# Patient Record
Sex: Female | Born: 1960 | Race: White | Hispanic: No | Smoking: Current every day smoker
Health system: Southern US, Community
[De-identification: ages and names within clinical notes are randomized; demographics above are authoritative.]

## PROBLEM LIST (undated history)

## (undated) DIAGNOSIS — Z720 Tobacco use: Secondary | ICD-10-CM

## (undated) DIAGNOSIS — I10 Essential (primary) hypertension: Secondary | ICD-10-CM

## (undated) DIAGNOSIS — I7300 Raynaud's syndrome without gangrene: Secondary | ICD-10-CM

## (undated) DIAGNOSIS — K219 Gastro-esophageal reflux disease without esophagitis: Secondary | ICD-10-CM

## (undated) DIAGNOSIS — F4310 Post-traumatic stress disorder, unspecified: Secondary | ICD-10-CM

## (undated) DIAGNOSIS — E7800 Pure hypercholesterolemia, unspecified: Secondary | ICD-10-CM

## (undated) DIAGNOSIS — I4891 Unspecified atrial fibrillation: Secondary | ICD-10-CM

## (undated) DIAGNOSIS — I639 Cerebral infarction, unspecified: Secondary | ICD-10-CM

## (undated) DIAGNOSIS — F419 Anxiety disorder, unspecified: Secondary | ICD-10-CM

## (undated) DIAGNOSIS — G43909 Migraine, unspecified, not intractable, without status migrainosus: Secondary | ICD-10-CM

## (undated) DIAGNOSIS — G609 Hereditary and idiopathic neuropathy, unspecified: Secondary | ICD-10-CM

## (undated) HISTORY — DX: Unspecified atrial fibrillation: I48.91

## (undated) HISTORY — PX: ABDOMINAL HYSTERECTOMY: SHX81

## (undated) HISTORY — DX: Migraine, unspecified, not intractable, without status migrainosus: G43.909

## (undated) HISTORY — DX: Hereditary and idiopathic neuropathy, unspecified: G60.9

## (undated) HISTORY — PX: KNEE SURGERY: SHX244

## (undated) HISTORY — PX: WRIST SURGERY: SHX841

## (undated) HISTORY — PX: HIP SURGERY: SHX245

## (undated) HISTORY — DX: Essential (primary) hypertension: I10

## (undated) HISTORY — DX: Tobacco use: Z72.0

## (undated) HISTORY — DX: Gastro-esophageal reflux disease without esophagitis: K21.9

---

## 2011-10-10 DIAGNOSIS — R110 Nausea: Secondary | ICD-10-CM | POA: Diagnosis not present

## 2011-10-10 DIAGNOSIS — R42 Dizziness and giddiness: Secondary | ICD-10-CM | POA: Diagnosis not present

## 2011-10-10 DIAGNOSIS — I731 Thromboangiitis obliterans [Buerger's disease]: Secondary | ICD-10-CM | POA: Diagnosis not present

## 2011-10-13 DIAGNOSIS — I731 Thromboangiitis obliterans [Buerger's disease]: Secondary | ICD-10-CM | POA: Diagnosis not present

## 2011-10-13 DIAGNOSIS — L97909 Non-pressure chronic ulcer of unspecified part of unspecified lower leg with unspecified severity: Secondary | ICD-10-CM | POA: Diagnosis not present

## 2011-10-21 DIAGNOSIS — I731 Thromboangiitis obliterans [Buerger's disease]: Secondary | ICD-10-CM | POA: Diagnosis not present

## 2011-10-23 DIAGNOSIS — M25519 Pain in unspecified shoulder: Secondary | ICD-10-CM | POA: Diagnosis not present

## 2011-10-23 DIAGNOSIS — R209 Unspecified disturbances of skin sensation: Secondary | ICD-10-CM | POA: Diagnosis not present

## 2011-10-23 DIAGNOSIS — IMO0001 Reserved for inherently not codable concepts without codable children: Secondary | ICD-10-CM | POA: Diagnosis not present

## 2011-10-23 DIAGNOSIS — M47812 Spondylosis without myelopathy or radiculopathy, cervical region: Secondary | ICD-10-CM | POA: Diagnosis not present

## 2011-10-23 DIAGNOSIS — M542 Cervicalgia: Secondary | ICD-10-CM | POA: Diagnosis not present

## 2011-10-25 DIAGNOSIS — M5412 Radiculopathy, cervical region: Secondary | ICD-10-CM | POA: Diagnosis not present

## 2011-10-25 DIAGNOSIS — M47812 Spondylosis without myelopathy or radiculopathy, cervical region: Secondary | ICD-10-CM | POA: Diagnosis not present

## 2011-10-25 DIAGNOSIS — M5030 Other cervical disc degeneration, unspecified cervical region: Secondary | ICD-10-CM | POA: Diagnosis not present

## 2011-10-25 DIAGNOSIS — M542 Cervicalgia: Secondary | ICD-10-CM | POA: Diagnosis not present

## 2011-10-28 DIAGNOSIS — M25519 Pain in unspecified shoulder: Secondary | ICD-10-CM | POA: Diagnosis not present

## 2011-10-28 DIAGNOSIS — M542 Cervicalgia: Secondary | ICD-10-CM | POA: Diagnosis not present

## 2011-10-28 DIAGNOSIS — M47812 Spondylosis without myelopathy or radiculopathy, cervical region: Secondary | ICD-10-CM | POA: Diagnosis not present

## 2011-10-28 DIAGNOSIS — IMO0001 Reserved for inherently not codable concepts without codable children: Secondary | ICD-10-CM | POA: Diagnosis not present

## 2011-10-28 DIAGNOSIS — R209 Unspecified disturbances of skin sensation: Secondary | ICD-10-CM | POA: Diagnosis not present

## 2011-10-30 DIAGNOSIS — M79609 Pain in unspecified limb: Secondary | ICD-10-CM | POA: Diagnosis not present

## 2011-10-30 DIAGNOSIS — M5412 Radiculopathy, cervical region: Secondary | ICD-10-CM | POA: Diagnosis not present

## 2011-10-30 DIAGNOSIS — M25519 Pain in unspecified shoulder: Secondary | ICD-10-CM | POA: Diagnosis not present

## 2011-10-30 DIAGNOSIS — M47812 Spondylosis without myelopathy or radiculopathy, cervical region: Secondary | ICD-10-CM | POA: Diagnosis not present

## 2011-11-11 DIAGNOSIS — R946 Abnormal results of thyroid function studies: Secondary | ICD-10-CM | POA: Diagnosis not present

## 2011-11-11 DIAGNOSIS — I731 Thromboangiitis obliterans [Buerger's disease]: Secondary | ICD-10-CM | POA: Diagnosis not present

## 2011-11-11 DIAGNOSIS — R6889 Other general symptoms and signs: Secondary | ICD-10-CM | POA: Diagnosis not present

## 2011-11-13 DIAGNOSIS — M25519 Pain in unspecified shoulder: Secondary | ICD-10-CM | POA: Diagnosis not present

## 2011-11-13 DIAGNOSIS — M5412 Radiculopathy, cervical region: Secondary | ICD-10-CM | POA: Diagnosis not present

## 2011-11-13 DIAGNOSIS — M79609 Pain in unspecified limb: Secondary | ICD-10-CM | POA: Diagnosis not present

## 2011-11-18 DIAGNOSIS — L97509 Non-pressure chronic ulcer of other part of unspecified foot with unspecified severity: Secondary | ICD-10-CM | POA: Diagnosis not present

## 2011-11-18 DIAGNOSIS — I7300 Raynaud's syndrome without gangrene: Secondary | ICD-10-CM | POA: Diagnosis not present

## 2011-11-25 DIAGNOSIS — I739 Peripheral vascular disease, unspecified: Secondary | ICD-10-CM | POA: Diagnosis not present

## 2011-12-01 DIAGNOSIS — M47812 Spondylosis without myelopathy or radiculopathy, cervical region: Secondary | ICD-10-CM | POA: Diagnosis not present

## 2011-12-01 DIAGNOSIS — M5412 Radiculopathy, cervical region: Secondary | ICD-10-CM | POA: Diagnosis not present

## 2011-12-01 DIAGNOSIS — R209 Unspecified disturbances of skin sensation: Secondary | ICD-10-CM | POA: Diagnosis not present

## 2011-12-01 DIAGNOSIS — M79609 Pain in unspecified limb: Secondary | ICD-10-CM | POA: Diagnosis not present

## 2011-12-03 DIAGNOSIS — M79609 Pain in unspecified limb: Secondary | ICD-10-CM | POA: Diagnosis not present

## 2011-12-03 DIAGNOSIS — G5790 Unspecified mononeuropathy of unspecified lower limb: Secondary | ICD-10-CM | POA: Diagnosis not present

## 2011-12-03 DIAGNOSIS — N059 Unspecified nephritic syndrome with unspecified morphologic changes: Secondary | ICD-10-CM | POA: Diagnosis not present

## 2011-12-15 DIAGNOSIS — G90529 Complex regional pain syndrome I of unspecified lower limb: Secondary | ICD-10-CM | POA: Diagnosis not present

## 2011-12-15 DIAGNOSIS — M79609 Pain in unspecified limb: Secondary | ICD-10-CM | POA: Diagnosis not present

## 2011-12-23 DIAGNOSIS — G90529 Complex regional pain syndrome I of unspecified lower limb: Secondary | ICD-10-CM | POA: Diagnosis not present

## 2011-12-23 DIAGNOSIS — G589 Mononeuropathy, unspecified: Secondary | ICD-10-CM | POA: Diagnosis not present

## 2012-01-07 DIAGNOSIS — Z5189 Encounter for other specified aftercare: Secondary | ICD-10-CM | POA: Diagnosis not present

## 2012-01-07 DIAGNOSIS — M79609 Pain in unspecified limb: Secondary | ICD-10-CM | POA: Diagnosis not present

## 2012-01-09 DIAGNOSIS — G90529 Complex regional pain syndrome I of unspecified lower limb: Secondary | ICD-10-CM | POA: Diagnosis not present

## 2012-01-09 DIAGNOSIS — G589 Mononeuropathy, unspecified: Secondary | ICD-10-CM | POA: Diagnosis not present

## 2012-01-26 DIAGNOSIS — I728 Aneurysm of other specified arteries: Secondary | ICD-10-CM | POA: Diagnosis not present

## 2012-01-26 DIAGNOSIS — I7300 Raynaud's syndrome without gangrene: Secondary | ICD-10-CM | POA: Diagnosis not present

## 2012-01-30 DIAGNOSIS — G90529 Complex regional pain syndrome I of unspecified lower limb: Secondary | ICD-10-CM | POA: Diagnosis not present

## 2012-01-30 DIAGNOSIS — G589 Mononeuropathy, unspecified: Secondary | ICD-10-CM | POA: Diagnosis not present

## 2012-02-06 DIAGNOSIS — I7300 Raynaud's syndrome without gangrene: Secondary | ICD-10-CM | POA: Diagnosis not present

## 2012-02-06 DIAGNOSIS — I731 Thromboangiitis obliterans [Buerger's disease]: Secondary | ICD-10-CM | POA: Diagnosis not present

## 2012-02-11 DIAGNOSIS — I7300 Raynaud's syndrome without gangrene: Secondary | ICD-10-CM | POA: Diagnosis not present

## 2012-03-09 DIAGNOSIS — I7300 Raynaud's syndrome without gangrene: Secondary | ICD-10-CM | POA: Diagnosis not present

## 2012-03-10 DIAGNOSIS — R002 Palpitations: Secondary | ICD-10-CM | POA: Diagnosis not present

## 2012-03-11 DIAGNOSIS — R002 Palpitations: Secondary | ICD-10-CM | POA: Diagnosis not present

## 2012-03-22 DIAGNOSIS — G589 Mononeuropathy, unspecified: Secondary | ICD-10-CM | POA: Diagnosis not present

## 2012-03-22 DIAGNOSIS — G90529 Complex regional pain syndrome I of unspecified lower limb: Secondary | ICD-10-CM | POA: Diagnosis not present

## 2012-04-26 DIAGNOSIS — N059 Unspecified nephritic syndrome with unspecified morphologic changes: Secondary | ICD-10-CM | POA: Diagnosis not present

## 2012-04-26 DIAGNOSIS — G589 Mononeuropathy, unspecified: Secondary | ICD-10-CM | POA: Diagnosis not present

## 2012-04-26 DIAGNOSIS — G8929 Other chronic pain: Secondary | ICD-10-CM | POA: Diagnosis not present

## 2012-05-12 DIAGNOSIS — I499 Cardiac arrhythmia, unspecified: Secondary | ICD-10-CM | POA: Diagnosis not present

## 2012-05-17 DIAGNOSIS — I4891 Unspecified atrial fibrillation: Secondary | ICD-10-CM | POA: Diagnosis not present

## 2012-07-28 DIAGNOSIS — J069 Acute upper respiratory infection, unspecified: Secondary | ICD-10-CM | POA: Diagnosis not present

## 2012-07-28 DIAGNOSIS — Z23 Encounter for immunization: Secondary | ICD-10-CM | POA: Diagnosis not present

## 2012-08-13 DIAGNOSIS — G589 Mononeuropathy, unspecified: Secondary | ICD-10-CM | POA: Diagnosis not present

## 2012-08-13 DIAGNOSIS — G5770 Causalgia of unspecified lower limb: Secondary | ICD-10-CM | POA: Diagnosis not present

## 2012-09-08 DIAGNOSIS — M65839 Other synovitis and tenosynovitis, unspecified forearm: Secondary | ICD-10-CM | POA: Diagnosis not present

## 2012-09-08 DIAGNOSIS — Z0000 Encounter for general adult medical examination without abnormal findings: Secondary | ICD-10-CM | POA: Diagnosis not present

## 2012-09-08 DIAGNOSIS — M65849 Other synovitis and tenosynovitis, unspecified hand: Secondary | ICD-10-CM | POA: Diagnosis not present

## 2012-09-16 DIAGNOSIS — M654 Radial styloid tenosynovitis [de Quervain]: Secondary | ICD-10-CM | POA: Diagnosis not present

## 2012-09-16 DIAGNOSIS — M65849 Other synovitis and tenosynovitis, unspecified hand: Secondary | ICD-10-CM | POA: Diagnosis not present

## 2012-09-27 DIAGNOSIS — I7300 Raynaud's syndrome without gangrene: Secondary | ICD-10-CM | POA: Diagnosis not present

## 2012-09-27 DIAGNOSIS — G8929 Other chronic pain: Secondary | ICD-10-CM | POA: Diagnosis not present

## 2012-09-27 DIAGNOSIS — M79609 Pain in unspecified limb: Secondary | ICD-10-CM | POA: Diagnosis not present

## 2012-09-27 DIAGNOSIS — I10 Essential (primary) hypertension: Secondary | ICD-10-CM | POA: Diagnosis not present

## 2012-09-27 DIAGNOSIS — M25579 Pain in unspecified ankle and joints of unspecified foot: Secondary | ICD-10-CM | POA: Diagnosis not present

## 2012-09-27 DIAGNOSIS — I739 Peripheral vascular disease, unspecified: Secondary | ICD-10-CM | POA: Diagnosis not present

## 2012-09-27 DIAGNOSIS — IMO0002 Reserved for concepts with insufficient information to code with codable children: Secondary | ICD-10-CM | POA: Diagnosis not present

## 2012-10-01 DIAGNOSIS — IMO0002 Reserved for concepts with insufficient information to code with codable children: Secondary | ICD-10-CM | POA: Diagnosis not present

## 2012-10-01 DIAGNOSIS — M79609 Pain in unspecified limb: Secondary | ICD-10-CM | POA: Diagnosis not present

## 2012-10-06 LAB — HM COLONOSCOPY

## 2012-10-12 DIAGNOSIS — M5126 Other intervertebral disc displacement, lumbar region: Secondary | ICD-10-CM | POA: Diagnosis not present

## 2012-10-20 DIAGNOSIS — E785 Hyperlipidemia, unspecified: Secondary | ICD-10-CM | POA: Diagnosis not present

## 2012-10-20 DIAGNOSIS — Z0000 Encounter for general adult medical examination without abnormal findings: Secondary | ICD-10-CM | POA: Diagnosis not present

## 2012-10-21 DIAGNOSIS — E785 Hyperlipidemia, unspecified: Secondary | ICD-10-CM | POA: Diagnosis not present

## 2012-10-21 DIAGNOSIS — Z0000 Encounter for general adult medical examination without abnormal findings: Secondary | ICD-10-CM | POA: Diagnosis not present

## 2012-10-21 DIAGNOSIS — M79609 Pain in unspecified limb: Secondary | ICD-10-CM | POA: Diagnosis not present

## 2012-10-21 DIAGNOSIS — G8929 Other chronic pain: Secondary | ICD-10-CM | POA: Diagnosis not present

## 2012-11-03 DIAGNOSIS — N059 Unspecified nephritic syndrome with unspecified morphologic changes: Secondary | ICD-10-CM | POA: Diagnosis not present

## 2012-11-03 DIAGNOSIS — IMO0002 Reserved for concepts with insufficient information to code with codable children: Secondary | ICD-10-CM | POA: Diagnosis not present

## 2012-12-23 DIAGNOSIS — Z1231 Encounter for screening mammogram for malignant neoplasm of breast: Secondary | ICD-10-CM | POA: Diagnosis not present

## 2012-12-23 DIAGNOSIS — M51379 Other intervertebral disc degeneration, lumbosacral region without mention of lumbar back pain or lower extremity pain: Secondary | ICD-10-CM | POA: Diagnosis not present

## 2012-12-23 DIAGNOSIS — G8929 Other chronic pain: Secondary | ICD-10-CM | POA: Diagnosis not present

## 2012-12-23 DIAGNOSIS — M5137 Other intervertebral disc degeneration, lumbosacral region: Secondary | ICD-10-CM | POA: Diagnosis not present

## 2012-12-23 DIAGNOSIS — M79609 Pain in unspecified limb: Secondary | ICD-10-CM | POA: Diagnosis not present

## 2013-01-20 DIAGNOSIS — M79609 Pain in unspecified limb: Secondary | ICD-10-CM | POA: Diagnosis not present

## 2013-01-20 DIAGNOSIS — M25579 Pain in unspecified ankle and joints of unspecified foot: Secondary | ICD-10-CM | POA: Diagnosis not present

## 2013-01-20 DIAGNOSIS — IMO0002 Reserved for concepts with insufficient information to code with codable children: Secondary | ICD-10-CM | POA: Diagnosis not present

## 2013-01-20 DIAGNOSIS — M4716 Other spondylosis with myelopathy, lumbar region: Secondary | ICD-10-CM | POA: Diagnosis not present

## 2013-10-06 LAB — HM MAMMOGRAPHY: HM MAMMO: NORMAL

## 2014-02-24 DIAGNOSIS — E042 Nontoxic multinodular goiter: Secondary | ICD-10-CM | POA: Diagnosis not present

## 2014-02-24 DIAGNOSIS — R220 Localized swelling, mass and lump, head: Secondary | ICD-10-CM | POA: Diagnosis not present

## 2014-02-24 DIAGNOSIS — Z1231 Encounter for screening mammogram for malignant neoplasm of breast: Secondary | ICD-10-CM | POA: Diagnosis not present

## 2014-02-24 DIAGNOSIS — R918 Other nonspecific abnormal finding of lung field: Secondary | ICD-10-CM | POA: Diagnosis not present

## 2014-02-24 DIAGNOSIS — E785 Hyperlipidemia, unspecified: Secondary | ICD-10-CM | POA: Diagnosis not present

## 2014-02-24 DIAGNOSIS — F17200 Nicotine dependence, unspecified, uncomplicated: Secondary | ICD-10-CM | POA: Diagnosis not present

## 2014-02-24 DIAGNOSIS — Z0000 Encounter for general adult medical examination without abnormal findings: Secondary | ICD-10-CM | POA: Diagnosis not present

## 2014-02-24 DIAGNOSIS — R221 Localized swelling, mass and lump, neck: Secondary | ICD-10-CM | POA: Diagnosis not present

## 2014-02-24 DIAGNOSIS — Z124 Encounter for screening for malignant neoplasm of cervix: Secondary | ICD-10-CM | POA: Diagnosis not present

## 2014-02-24 DIAGNOSIS — R634 Abnormal weight loss: Secondary | ICD-10-CM | POA: Diagnosis not present

## 2014-04-10 DIAGNOSIS — J309 Allergic rhinitis, unspecified: Secondary | ICD-10-CM | POA: Diagnosis not present

## 2014-04-10 DIAGNOSIS — R911 Solitary pulmonary nodule: Secondary | ICD-10-CM | POA: Diagnosis not present

## 2014-04-10 DIAGNOSIS — R634 Abnormal weight loss: Secondary | ICD-10-CM | POA: Diagnosis not present

## 2014-04-10 DIAGNOSIS — K219 Gastro-esophageal reflux disease without esophagitis: Secondary | ICD-10-CM | POA: Diagnosis not present

## 2014-05-08 DIAGNOSIS — Z23 Encounter for immunization: Secondary | ICD-10-CM | POA: Diagnosis not present

## 2014-06-21 DIAGNOSIS — R634 Abnormal weight loss: Secondary | ICD-10-CM | POA: Diagnosis not present

## 2014-06-21 DIAGNOSIS — Z733 Stress, not elsewhere classified: Secondary | ICD-10-CM | POA: Diagnosis not present

## 2014-08-30 ENCOUNTER — Encounter (HOSPITAL_COMMUNITY): Payer: Self-pay | Admitting: *Deleted

## 2014-08-30 ENCOUNTER — Emergency Department (HOSPITAL_COMMUNITY): Payer: Medicare Other

## 2014-08-30 ENCOUNTER — Inpatient Hospital Stay (HOSPITAL_COMMUNITY)
Admission: EM | Admit: 2014-08-30 | Discharge: 2014-09-06 | DRG: 065 | Disposition: A | Discharge status: Home Health | Payer: Medicare Other | Attending: Internal Medicine | Admitting: Internal Medicine

## 2014-08-30 DIAGNOSIS — I639 Cerebral infarction, unspecified: Secondary | ICD-10-CM | POA: Diagnosis not present

## 2014-08-30 DIAGNOSIS — R21 Rash and other nonspecific skin eruption: Secondary | ICD-10-CM | POA: Insufficient documentation

## 2014-08-30 DIAGNOSIS — E7800 Pure hypercholesterolemia, unspecified: Secondary | ICD-10-CM | POA: Diagnosis present

## 2014-08-30 DIAGNOSIS — Z720 Tobacco use: Secondary | ICD-10-CM | POA: Insufficient documentation

## 2014-08-30 DIAGNOSIS — G459 Transient cerebral ischemic attack, unspecified: Secondary | ICD-10-CM | POA: Diagnosis present

## 2014-08-30 DIAGNOSIS — I63032 Cerebral infarction due to thrombosis of left carotid artery: Principal | ICD-10-CM | POA: Insufficient documentation

## 2014-08-30 DIAGNOSIS — E785 Hyperlipidemia, unspecified: Secondary | ICD-10-CM | POA: Diagnosis present

## 2014-08-30 DIAGNOSIS — I2510 Atherosclerotic heart disease of native coronary artery without angina pectoris: Secondary | ICD-10-CM | POA: Diagnosis present

## 2014-08-30 DIAGNOSIS — G458 Other transient cerebral ischemic attacks and related syndromes: Secondary | ICD-10-CM

## 2014-08-30 DIAGNOSIS — F4310 Post-traumatic stress disorder, unspecified: Secondary | ICD-10-CM | POA: Diagnosis present

## 2014-08-30 DIAGNOSIS — F419 Anxiety disorder, unspecified: Secondary | ICD-10-CM | POA: Diagnosis present

## 2014-08-30 DIAGNOSIS — L258 Unspecified contact dermatitis due to other agents: Secondary | ICD-10-CM | POA: Diagnosis present

## 2014-08-30 DIAGNOSIS — R531 Weakness: Secondary | ICD-10-CM | POA: Diagnosis not present

## 2014-08-30 DIAGNOSIS — D649 Anemia, unspecified: Secondary | ICD-10-CM | POA: Diagnosis present

## 2014-08-30 DIAGNOSIS — G8191 Hemiplegia, unspecified affecting right dominant side: Secondary | ICD-10-CM | POA: Diagnosis present

## 2014-08-30 DIAGNOSIS — J40 Bronchitis, not specified as acute or chronic: Secondary | ICD-10-CM | POA: Diagnosis not present

## 2014-08-30 DIAGNOSIS — R4701 Aphasia: Secondary | ICD-10-CM | POA: Diagnosis present

## 2014-08-30 DIAGNOSIS — I359 Nonrheumatic aortic valve disorder, unspecified: Secondary | ICD-10-CM | POA: Diagnosis not present

## 2014-08-30 DIAGNOSIS — I63422 Cerebral infarction due to embolism of left anterior cerebral artery: Secondary | ICD-10-CM | POA: Diagnosis not present

## 2014-08-30 DIAGNOSIS — I6340 Cerebral infarction due to embolism of unspecified cerebral artery: Secondary | ICD-10-CM | POA: Insufficient documentation

## 2014-08-30 DIAGNOSIS — K219 Gastro-esophageal reflux disease without esophagitis: Secondary | ICD-10-CM | POA: Diagnosis present

## 2014-08-30 DIAGNOSIS — F17210 Nicotine dependence, cigarettes, uncomplicated: Secondary | ICD-10-CM | POA: Diagnosis present

## 2014-08-30 DIAGNOSIS — I340 Nonrheumatic mitral (valve) insufficiency: Secondary | ICD-10-CM | POA: Diagnosis not present

## 2014-08-30 DIAGNOSIS — I7300 Raynaud's syndrome without gangrene: Secondary | ICD-10-CM | POA: Diagnosis present

## 2014-08-30 DIAGNOSIS — I6789 Other cerebrovascular disease: Secondary | ICD-10-CM | POA: Diagnosis not present

## 2014-08-30 DIAGNOSIS — I10 Essential (primary) hypertension: Secondary | ICD-10-CM | POA: Diagnosis present

## 2014-08-30 HISTORY — DX: Anxiety disorder, unspecified: F41.9

## 2014-08-30 HISTORY — DX: Raynaud's syndrome without gangrene: I73.00

## 2014-08-30 HISTORY — DX: Pure hypercholesterolemia, unspecified: E78.00

## 2014-08-30 HISTORY — DX: Post-traumatic stress disorder, unspecified: F43.10

## 2014-08-30 HISTORY — DX: Cerebral infarction, unspecified: I63.9

## 2014-08-30 LAB — URINE MICROSCOPIC-ADD ON

## 2014-08-30 LAB — DIFFERENTIAL
Basophils Absolute: 0 10*3/uL (ref 0.0–0.1)
Basophils Relative: 0 % (ref 0–1)
EOS PCT: 2 % (ref 0–5)
Eosinophils Absolute: 0.1 10*3/uL (ref 0.0–0.7)
Lymphocytes Relative: 27 % (ref 12–46)
Lymphs Abs: 2 10*3/uL (ref 0.7–4.0)
MONOS PCT: 6 % (ref 3–12)
Monocytes Absolute: 0.4 10*3/uL (ref 0.1–1.0)
Neutro Abs: 4.9 10*3/uL (ref 1.7–7.7)
Neutrophils Relative %: 65 % (ref 43–77)

## 2014-08-30 LAB — COMPREHENSIVE METABOLIC PANEL
ALBUMIN: 3.7 g/dL (ref 3.5–5.2)
ALK PHOS: 71 U/L (ref 39–117)
ALT: 8 U/L (ref 0–35)
AST: 14 U/L (ref 0–37)
Anion gap: 12 (ref 5–15)
BILIRUBIN TOTAL: 0.2 mg/dL — AB (ref 0.3–1.2)
BUN: 9 mg/dL (ref 6–23)
CO2: 24 meq/L (ref 19–32)
CREATININE: 0.69 mg/dL (ref 0.50–1.10)
Calcium: 8.8 mg/dL (ref 8.4–10.5)
Chloride: 104 mEq/L (ref 96–112)
GFR calc Af Amer: >90 mL/min (ref >90)
Glucose, Bld: 101 mg/dL — ABNORMAL HIGH (ref 70–99)
POTASSIUM: 3.9 meq/L (ref 3.7–5.3)
Sodium: 140 mEq/L (ref 137–147)
Total Protein: 7.1 g/dL (ref 6.0–8.3)

## 2014-08-30 LAB — APTT: aPTT: 37 seconds (ref 24–37)

## 2014-08-30 LAB — URINALYSIS, ROUTINE W REFLEX MICROSCOPIC
BILIRUBIN URINE: NEGATIVE
GLUCOSE, UA: NEGATIVE mg/dL
KETONES UR: NEGATIVE mg/dL
Nitrite: NEGATIVE
PH: 7 (ref 5.0–8.0)
Protein, ur: NEGATIVE mg/dL
SPECIFIC GRAVITY, URINE: 1.005 (ref 1.005–1.030)
Urobilinogen, UA: 1 mg/dL (ref 0.0–1.0)

## 2014-08-30 LAB — PROTIME-INR
INR: 1.12 (ref 0.00–1.49)
Prothrombin Time: 14.6 seconds (ref 11.6–15.2)

## 2014-08-30 LAB — I-STAT CHEM 8, ED
BUN: 8 mg/dL (ref 6–23)
CALCIUM ION: 1.14 mmol/L (ref 1.12–1.23)
Chloride: 102 mEq/L (ref 96–112)
Creatinine, Ser: 0.8 mg/dL (ref 0.50–1.10)
GLUCOSE: 103 mg/dL — AB (ref 70–99)
HCT: 38 % (ref 36.0–46.0)
Hemoglobin: 12.9 g/dL (ref 12.0–15.0)
Potassium: 3.9 mEq/L (ref 3.7–5.3)
Sodium: 141 mEq/L (ref 137–147)
TCO2: 23 mmol/L (ref 0–100)

## 2014-08-30 LAB — CBC
HEMATOCRIT: 35.7 % — AB (ref 36.0–46.0)
Hemoglobin: 12 g/dL (ref 12.0–15.0)
MCH: 31.8 pg (ref 26.0–34.0)
MCHC: 33.6 g/dL (ref 30.0–36.0)
MCV: 94.7 fL (ref 78.0–100.0)
PLATELETS: 158 10*3/uL (ref 150–400)
RBC: 3.77 MIL/uL — AB (ref 3.87–5.11)
RDW: 12.7 % (ref 11.5–15.5)
WBC: 7.4 10*3/uL (ref 4.0–10.5)

## 2014-08-30 LAB — I-STAT TROPONIN, ED: Troponin i, poc: 0 ng/mL (ref 0.00–0.08)

## 2014-08-30 NOTE — ED Notes (Signed)
Pt c/o R arm and leg weakness onset at 2030. [place] live in a hotel. Pt reports walking back from the front desk of the hotel when her R leg became limb. Pt had to drag herself to her room. Pts husband reports pt had a hard time speaking and words were slurred. EMS activated Code Stroke. Pt unable to lift R leg; reports sensation is decreased compared to L foot. Equal grips present. Speech is clear

## 2014-08-30 NOTE — Code Documentation (Signed)
53 yo wf [facility] for Rt side weakness & speech difficulty.  Per the pt she was walking to her bedroom from her office when she had a sudden onset Rt upper & lower ext weakness & word finding difficulty.  By arrival to MD the pt's speech had resolved.  She has baseline shaking from anxiety.  NIH 5 for weakness.  Pt on TIA alert.

## 2014-08-30 NOTE — ED Provider Notes (Signed)
CSN: [id number]     Arrival date & time 08/30/14  2141 History   First MD Initiated Contact with Patient 08/30/14 2145     No chief complaint on file.     The history is provided by the patient and the EMS personnel.   Patient presents for evaluation of right-sided weakness and aphasia. Symptoms started at 8:30 when she was walking to her room and she noticed that she was having difficulty moving her right leg and right arm. She presents via EMS as an activated code stroke. Per EMS she did have a speech difficulties that has improved on the way to hospital. Symptoms are moderate, constant, and improving.  No past medical history on file. No past surgical history on file. No family history on file. History  Substance Use Topics  . Smoking status: Not on file  . Smokeless tobacco: Not on file  . Alcohol Use: Not on file   OB History    No data available     Review of Systems  All other systems reviewed and are negative.    Allergies  Review of patient's allergies indicates not on file.  Home Medications   Prior to Admission medications   Not on File   There were no vitals taken for this visit. Physical Exam  Constitutional: She is oriented to person, place, and time. She appears well-developed and well-nourished.  HENT:  Head: Normocephalic and atraumatic.  Cardiovascular: Normal rate and regular rhythm.   No murmur heard. Pulmonary/Chest: Effort normal and breath sounds normal. No respiratory distress.  Abdominal: Soft. There is no tenderness. There is no rebound and no guarding.  Musculoskeletal: She exhibits no edema or tenderness.  Neurological: She is alert and oriented to person, place, and time.  4/5 RUE strength, 0/5 RLE strength, no facial assymetry, sensation to light touch intact in all four extremities  Skin: Skin is warm and dry.  Psychiatric: She has a normal mood and affect. Her behavior is normal.  Nursing note and vitals reviewed.   ED Course   Procedures (including critical care time) Labs Review Labs Reviewed  CBC - Abnormal; Notable for the following:    RBC 3.77 (*)    HCT 35.7 (*)    All other components within normal limits  COMPREHENSIVE METABOLIC PANEL - Abnormal; Notable for the following:    Glucose, Bld 101 (*)    Total Bilirubin 0.2 (*)    All other components within normal limits  URINALYSIS, ROUTINE W REFLEX MICROSCOPIC - Abnormal; Notable for the following:    Hgb urine dipstick MODERATE (*)    Leukocytes, UA SMALL (*)    All other components within normal limits  I-STAT CHEM 8, ED - Abnormal; Notable for the following:    Glucose, Bld 103 (*)    All other components within normal limits  PROTIME-INR  APTT  DIFFERENTIAL  URINE MICROSCOPIC-ADD ON  Rosezena SensorI-STAT TROPOININ, ED    Imaging Review Ct Head (brain) Wo Contrast  08/30/2014   CLINICAL DATA:  Right-sided weakness.  EXAM: CT HEAD WITHOUT CONTRAST  TECHNIQUE: Contiguous axial images were obtained from the base of the skull through the vertex without intravenous contrast.  COMPARISON:  None.  FINDINGS: No acute intracranial abnormality. Specifically, no hemorrhage, hydrocephalus, mass lesion, acute infarction, or significant intracranial injury. No acute calvarial abnormality. Visualized paranasal sinuses and mastoids clear. Orbital soft tissues unremarkable.  IMPRESSION: Negative.  Critical Value/emergent results were called by telephone at the time of interpretation on 08/30/2014 at  9:57 pm to Dr. Roseanne RenoStewart , who verbally acknowledged these results.   Electronically Signed   By: Charlett NoseKevin  Dover M.D.   On: 08/30/2014 21:57     EKG Interpretation None      MDM   Final diagnoses:  Other specified transient cerebral ischemias    Patient presented via EMS for evaluation of right hemiplegia. Code stroke was activated and patient was evaluated by Dr. Roseanne RenoStewart with neurology. Patient had marked improvement in her deficits prior to arrival and during her emergency  department stay. Patient not felt to be a TPA candidate and medicine was contacted for admission for TIA/stroke workup.     Tilden FossaElizabeth Sasan Wilkie, MD 08/31/14 [phone]

## 2014-08-30 NOTE — ED Notes (Signed)
Pt to ED from home via [facility] as a code stroke. LSN 2030. Pt was walking to her bed room when she began having R arm weakness, R leg weakness, and slurred speech. Per EMS, speech has improved; speech clear at this time

## 2014-08-30 NOTE — Consult Note (Signed)
Referring Physician: Madilyn HookEES, E    Chief Complaint: New onset right side weakness and numbness.  HPI: Dawn Escobar is an 53 y.o. female with a history of hypercholesterolemia, posttraumatic stress disorder, Raynaud's disease and anxiety who experienced acute onset of right-sided weakness and numbness at 8:40 PM tonight. She has no previous history of stroke nor TIA. She has not been on antiplatelet therapy. CT scan of her head showed no acute intracranial abnormality. Deficits improved after arriving in the emergency room, including return of her speech to normal as well as use of her right upper extremity. Deficits involving right lower extremity as well as sensory complaints were felt to likely be functional in etiology. Because her symptoms were improving and there were no clear objective deficits, TPA was not administered. NIH stroke score was 5.  LSN: 8:40 PM on 08/30/2014 tPA Given: No: Improving deficits and lack of clear objective remaining focal deficits mRankin:  Past Medical History  Diagnosis Date  . PTSD (post-traumatic stress disorder)   . Raynaud's disease   . Heart murmur   . Anxiety   . Hypercholesteremia     Family history: Negative for stroke but positive for hypercholesterolemia and hypertension.  Medications: I have reviewed the patient's current medications.  ROS: History obtained from the patient  General ROS: negative for - chills, fatigue, fever, night sweats, weight gain or weight loss Psychological ROS: negative for - behavioral disorder, hallucinations, memory difficulties, mood swings or suicidal ideation Ophthalmic ROS: negative for - blurry vision, double vision, eye pain or loss of vision ENT ROS: negative for - epistaxis, nasal discharge, oral lesions, sore throat, tinnitus or vertigo Allergy and Immunology ROS: negative for - hives or itchy/watery eyes Hematological and Lymphatic ROS: negative for - bleeding problems, bruising or swollen lymph  nodes Endocrine ROS: negative for - galactorrhea, hair pattern changes, polydipsia/polyuria or temperature intolerance Respiratory ROS: negative for - cough, hemoptysis, shortness of breath or wheezing Cardiovascular ROS: negative for - chest pain, dyspnea on exertion, edema or irregular heartbeat Gastrointestinal ROS: negative for - abdominal pain, diarrhea, hematemesis, nausea/vomiting or stool incontinence Genito-Urinary ROS: negative for - dysuria, hematuria, incontinence or urinary frequency/urgency Musculoskeletal ROS: negative for - joint swelling or muscular weakness Neurological ROS: as noted in HPI Dermatological ROS: negative for rash and skin lesion changes  Physical Examination: Blood pressure 158/74, pulse 72, temperature 98.6 F (37 C), temperature source Oral, resp. rate 16, SpO2 99 %.  Appearance was that of a slender middle-aged lady who is alert and in no acute distress. HEENT: Normal Neck: Supple with full range of motion Heart: Normal rate and rhythm med: Normal S1 and S2; no murmur nor gallop.  Neurologic Examination: Mental Status: Alert, oriented, thought content appropriate.  Speech fluent without evidence of aphasia. Able to follow commands without difficulty. Cranial Nerves: II-Visual fields were normal. III/IV/VI-Pupils were equal and reacted. Extraocular movements were full and conjugate.    V/VII-slight right facial numbness; no facial weakness. VIII-normal. X-normal speech. Motor: Absent voluntary movement of right lower extremity proximally and distally, thought to likely be due to poor effort; motor exam is otherwise unremarkable. Sensory: Reduced perception of tactile sensation over right extremities compared to left extremities. Deep Tendon Reflexes: 2+ and symmetric. Plantars: Flexor bilaterally Cerebellar: Normal finger-to-nose testing bilaterally. Carotid auscultation: Normal  Ct Head (brain) Wo Contrast  08/30/2014   CLINICAL DATA:   Right-sided weakness.  EXAM: CT HEAD WITHOUT CONTRAST  TECHNIQUE: Contiguous axial images were obtained from the base of the skull  through the vertex without intravenous contrast.  COMPARISON:  None.  FINDINGS: No acute intracranial abnormality. Specifically, no hemorrhage, hydrocephalus, mass lesion, acute infarction, or significant intracranial injury. No acute calvarial abnormality. Visualized paranasal sinuses and mastoids clear. Orbital soft tissues unremarkable.  IMPRESSION: Negative.  Critical Value/emergent results were called by telephone at the time of interpretation on 08/30/2014 at 9:57 pm to Dr. Roseanne RenoStewart , who verbally acknowledged these results.   Electronically Signed   By: Charlett NoseKevin  Dover M.D.   On: 08/30/2014 21:57    Assessment: 53 y.o. female presenting with weakness and numbness involving the right side with improvement since arrival in the emergency room. Psychophysiologic factors are suspected as contributor to patient's symptomatology. However, TIA or small ischemic stroke cannot be ruled out.  Stroke Risk Factors - hyperlipidemia  Plan: 1. HgbA1c, fasting lipid panel 2. MRI, MRA  of the brain without contrast 3. PT consult, OT consult, Speech consult 4. Echocardiogram 5. Carotid dopplers 6. Prophylactic therapy-Antiplatelet med: Aspirin  7. Risk factor modification 8. Telemetry monitoring   C.R. Roseanne RenoStewart, MD [facility] [phone]  08/30/2014, 10:32 PM

## 2014-08-30 NOTE — ED Notes (Signed)
Dr. Kakrakandy at bedside. 

## 2014-08-31 ENCOUNTER — Encounter (HOSPITAL_COMMUNITY): Payer: Self-pay | Admitting: Internal Medicine

## 2014-08-31 ENCOUNTER — Observation Stay (HOSPITAL_COMMUNITY): Payer: Medicare Other

## 2014-08-31 DIAGNOSIS — G458 Other transient cerebral ischemic attacks and related syndromes: Secondary | ICD-10-CM

## 2014-08-31 DIAGNOSIS — Z720 Tobacco use: Secondary | ICD-10-CM

## 2014-08-31 DIAGNOSIS — J40 Bronchitis, not specified as acute or chronic: Secondary | ICD-10-CM | POA: Diagnosis not present

## 2014-08-31 LAB — COMPREHENSIVE METABOLIC PANEL
ALT: 7 U/L (ref 0–35)
AST: 12 U/L (ref 0–37)
Albumin: 3.3 g/dL — ABNORMAL LOW (ref 3.5–5.2)
Alkaline Phosphatase: 65 U/L (ref 39–117)
Anion gap: 11 (ref 5–15)
BILIRUBIN TOTAL: 0.3 mg/dL (ref 0.3–1.2)
BUN: 8 mg/dL (ref 6–23)
CHLORIDE: 108 meq/L (ref 96–112)
CO2: 25 mEq/L (ref 19–32)
Calcium: 8.7 mg/dL (ref 8.4–10.5)
Creatinine, Ser: 0.62 mg/dL (ref 0.50–1.10)
GFR calc Af Amer: >90 mL/min (ref >90)
GFR calc non Af Amer: >90 mL/min (ref >90)
Glucose, Bld: 96 mg/dL (ref 70–99)
Potassium: 3.8 mEq/L (ref 3.7–5.3)
Sodium: 144 mEq/L (ref 137–147)
Total Protein: 6.5 g/dL (ref 6.0–8.3)

## 2014-08-31 LAB — CBC WITH DIFFERENTIAL/PLATELET
BASOS PCT: 1 % (ref 0–1)
Basophils Absolute: 0.1 10*3/uL (ref 0.0–0.1)
EOS PCT: 3 % (ref 0–5)
Eosinophils Absolute: 0.2 10*3/uL (ref 0.0–0.7)
HCT: 35 % — ABNORMAL LOW (ref 36.0–46.0)
HEMOGLOBIN: 11.8 g/dL — AB (ref 12.0–15.0)
LYMPHS ABS: 2 10*3/uL (ref 0.7–4.0)
LYMPHS PCT: 32 % (ref 12–46)
MCH: 31.8 pg (ref 26.0–34.0)
MCHC: 33.7 g/dL (ref 30.0–36.0)
MCV: 94.3 fL (ref 78.0–100.0)
MONOS PCT: 8 % (ref 3–12)
Monocytes Absolute: 0.5 10*3/uL (ref 0.1–1.0)
Neutro Abs: 3.6 10*3/uL (ref 1.7–7.7)
Neutrophils Relative %: 56 % (ref 43–77)
Platelets: 162 10*3/uL (ref 150–400)
RBC: 3.71 MIL/uL — AB (ref 3.87–5.11)
RDW: 12.7 % (ref 11.5–15.5)
WBC: 6.4 10*3/uL (ref 4.0–10.5)

## 2014-08-31 LAB — RAPID URINE DRUG SCREEN, HOSP PERFORMED
Amphetamines: NOT DETECTED
Barbiturates: NOT DETECTED
Benzodiazepines: NOT DETECTED
COCAINE: NOT DETECTED
OPIATES: NOT DETECTED
Tetrahydrocannabinol: NOT DETECTED

## 2014-08-31 LAB — LIPID PANEL
CHOL/HDL RATIO: 2.1 ratio
Cholesterol: 103 mg/dL (ref 0–200)
HDL: 48 mg/dL (ref >39)
LDL Cholesterol: 46 mg/dL (ref 0–99)
Triglycerides: 47 mg/dL (ref <150)
VLDL: 9 mg/dL (ref 0–40)

## 2014-08-31 LAB — CBC
HEMATOCRIT: 34.2 % — AB (ref 36.0–46.0)
HEMOGLOBIN: 11.6 g/dL — AB (ref 12.0–15.0)
MCH: 31.5 pg (ref 26.0–34.0)
MCHC: 33.9 g/dL (ref 30.0–36.0)
MCV: 92.9 fL (ref 78.0–100.0)
Platelets: 164 10*3/uL (ref 150–400)
RBC: 3.68 MIL/uL — ABNORMAL LOW (ref 3.87–5.11)
RDW: 12.8 % (ref 11.5–15.5)
WBC: 6.7 10*3/uL (ref 4.0–10.5)

## 2014-08-31 LAB — HEMOGLOBIN A1C
Hgb A1c MFr Bld: 5.6 % (ref <5.7)
Mean Plasma Glucose: 114 mg/dL (ref <117)

## 2014-08-31 LAB — TSH: TSH: 2.19 u[IU]/mL (ref 0.350–4.500)

## 2014-08-31 LAB — GLUCOSE, CAPILLARY
GLUCOSE-CAPILLARY: 136 mg/dL — AB (ref 70–99)
Glucose-Capillary: 106 mg/dL — ABNORMAL HIGH (ref 70–99)
Glucose-Capillary: 110 mg/dL — ABNORMAL HIGH (ref 70–99)
Glucose-Capillary: 125 mg/dL — ABNORMAL HIGH (ref 70–99)

## 2014-08-31 LAB — CREATININE, SERUM
Creatinine, Ser: 0.63 mg/dL (ref 0.50–1.10)
GFR calc Af Amer: >90 mL/min (ref >90)

## 2014-08-31 MED ORDER — GABAPENTIN 800 MG PO TABS
800.0000 mg | ORAL_TABLET | Freq: Three times a day (TID) | ORAL | Status: DC
  Filled 2014-08-31 (×2): qty 1

## 2014-08-31 MED ORDER — STROKE: EARLY STAGES OF RECOVERY BOOK
Freq: Once | Status: AC
Start: 2014-08-31 — End: 2014-08-31
  Administered 2014-08-31: 02:00:00
  Filled 2014-08-31: qty 1

## 2014-08-31 MED ORDER — CYCLOBENZAPRINE HCL 10 MG PO TABS
5.0000 mg | ORAL_TABLET | Freq: Three times a day (TID) | ORAL | Status: DC | PRN
  Administered 2014-08-31 – 2014-09-06 (×11): 5 mg via ORAL
  Filled 2014-08-31 (×12): qty 1

## 2014-08-31 MED ORDER — ASPIRIN 325 MG PO TABS
325.0000 mg | ORAL_TABLET | Freq: Every day | ORAL | Status: DC
  Administered 2014-08-31 – 2014-09-06 (×7): 325 mg via ORAL
  Filled 2014-08-31 (×7): qty 1

## 2014-08-31 MED ORDER — PANTOPRAZOLE SODIUM 40 MG PO TBEC
40.0000 mg | DELAYED_RELEASE_TABLET | Freq: Every day | ORAL | Status: DC
  Administered 2014-08-31 – 2014-09-04 (×5): 40 mg via ORAL
  Filled 2014-08-31 (×5): qty 1

## 2014-08-31 MED ORDER — ACETAMINOPHEN 325 MG PO TABS
650.0000 mg | ORAL_TABLET | ORAL | Status: DC | PRN
  Administered 2014-08-31 – 2014-09-05 (×5): 650 mg via ORAL
  Filled 2014-08-31 (×5): qty 2

## 2014-08-31 MED ORDER — SODIUM CHLORIDE 0.9 % IV SOLN
INTRAVENOUS | Status: DC
  Administered 2014-08-31: 1000 mL via INTRAVENOUS

## 2014-08-31 MED ORDER — TRAMADOL HCL 50 MG PO TABS
50.0000 mg | ORAL_TABLET | Freq: Once | ORAL | Status: AC
  Administered 2014-08-31: 50 mg via ORAL
  Filled 2014-08-31: qty 1

## 2014-08-31 MED ORDER — GABAPENTIN 400 MG PO CAPS
800.0000 mg | ORAL_CAPSULE | Freq: Three times a day (TID) | ORAL | Status: DC
  Administered 2014-08-31 – 2014-09-06 (×17): 800 mg via ORAL
  Filled 2014-08-31 (×10): qty 2
  Filled 2014-08-31: qty 8
  Filled 2014-08-31 (×7): qty 2

## 2014-08-31 MED ORDER — CLONAZEPAM 0.5 MG PO TABS
0.5000 mg | ORAL_TABLET | Freq: Three times a day (TID) | ORAL | Status: DC | PRN
  Administered 2014-08-31 – 2014-09-06 (×11): 0.5 mg via ORAL
  Filled 2014-08-31 (×13): qty 1

## 2014-08-31 MED ORDER — ROSUVASTATIN CALCIUM 40 MG PO TABS
40.0000 mg | ORAL_TABLET | Freq: Every day | ORAL | Status: DC
  Administered 2014-08-31: 40 mg via ORAL
  Filled 2014-08-31 (×2): qty 1

## 2014-08-31 MED ORDER — TRAZODONE HCL 100 MG PO TABS
100.0000 mg | ORAL_TABLET | Freq: Every evening | ORAL | Status: DC | PRN
  Administered 2014-08-31 – 2014-09-05 (×5): 100 mg via ORAL
  Filled 2014-08-31 (×5): qty 1

## 2014-08-31 MED ORDER — ENOXAPARIN SODIUM 40 MG/0.4ML ~~LOC~~ SOLN
40.0000 mg | SUBCUTANEOUS | Status: DC
  Administered 2014-08-31 – 2014-09-05 (×6): 40 mg via SUBCUTANEOUS
  Filled 2014-08-31 (×6): qty 0.4

## 2014-08-31 NOTE — Progress Notes (Signed)
UR completed 

## 2014-08-31 NOTE — Plan of Care (Signed)
Problem: Consults Goal: Ischemic Stroke Patient Education See Patient Education Module for education specifics. Outcome: Completed/Met Date Met:  08/31/14

## 2014-08-31 NOTE — Plan of Care (Signed)
Problem: Acute Treatment Outcomes Goal: Neuro exam at baseline or improved Outcome: Completed/Met Date Met:  08/31/14

## 2014-08-31 NOTE — Progress Notes (Signed)
PATIENT DETAILS Name: Dawn Escobar Age: 53 y.o. Sex: female Date of Birth: Jan 16, 1961 Admit Date: 08/30/2014 Admitting Physician Eduard ClosArshad N Kakrakandy, MD PCP:No PCP Per Patient  Subjective: Speech significantly improved-not back to baseline yet-appears to be nonfluent at times. Minimal right upper and right lower extremity weakness.  Assessment/Plan: Principal Problem:   Suspected small CVA: Admitted with right upper/right lower extremity weakness and aphasia. Symptoms almost resolved, speech still not fluent but almost back to baseline. Very minimal right-sided weakness on exam. Await MRI/MRA brain, echocardiogram and carotid Doppler. LDL at goal at 46, continue statins. Hemoglobin A1c pending. Currently on aspirin, continue. Await further recommendations from stroke team.   Active Problems:   Hypercholesterolemia: As above. Continue statin    Tobacco abuse: Also extensively, advised to quit.    History of the raynaud's phenomena:      History of GERD: Continue with PPI    History of anxiety: Continue with when necessary clonazepam.    Disposition: Remain inpatient-Home when work up complete  Antibiotics:  None   Anti-infectives    None      DVT Prophylaxis: Prophylactic Lovenox  Code Status: Full code   Family Communication None at bedside  Procedures:  None  CONSULTS:  neurology  Time spent 40 minutes-which includes 50% of the time with face-to-face with patient/ family and coordinating care related to the above assessment and plan.  MEDICATIONS: Scheduled Meds: . aspirin  325 mg Oral Daily  . enoxaparin (LOVENOX) injection  40 mg Subcutaneous Q24H  . pantoprazole  40 mg Oral Daily  . rosuvastatin  40 mg Oral q1800   Continuous Infusions: . sodium chloride 1,000 mL (08/31/14 0129)   PRN Meds:.acetaminophen, clonazePAM, cyclobenzaprine    PHYSICAL EXAM: Vital signs in last 24 hours: Filed Vitals:   08/31/14 0300 08/31/14 0500  08/31/14 0700 08/31/14 0845  BP: 132/83 145/71 132/74 135/77  Pulse: 59 66 66 61  Temp: 98.6 F (37 C) 99.1 F (37.3 C) 99 F (37.2 C) 98.6 F (37 C)  TempSrc: Oral Oral Oral Oral  Resp: 15 17 17 17   Height:      Weight:      SpO2: 99% 100% 97% 98%    Weight change:  Filed Weights   08/31/14 0100  Weight: 67.132 kg (148 lb)   Body mass index is 24.63 kg/(m^2).   Gen Exam: Awake and alert with clear speech-but at times non fluent.   Neck: Supple, No JVD.   Chest: B/L Clear.   CVS: S1 S2 Regular, no murmurs.  Abdomen: soft, BS +, non tender, non distended.  Extremities: no edema, lower extremities warm to touch. Neurologic:Very minimal RUE/RLE weakness  Skin: No Rash.   Wounds: N/A.    Intake/Output from previous day: No intake or output data in the 24 hours ending 08/31/14 1023   LAB RESULTS: CBC  Recent Labs Lab 08/30/14 2143 08/30/14 2156 08/31/14 0500  WBC 7.4  --  6.4  6.7  HGB 12.0 12.9 11.8*  11.6*  HCT 35.7* 38.0 35.0*  34.2*  PLT 158  --  162  164  MCV 94.7  --  94.3  92.9  MCH 31.8  --  31.8  31.5  MCHC 33.6  --  33.7  33.9  RDW 12.7  --  12.7  12.8  LYMPHSABS 2.0  --  2.0  MONOABS 0.4  --  0.5  EOSABS 0.1  --  0.2  BASOSABS 0.0  --  0.1    Chemistries   Recent Labs Lab 08/30/14 2143 08/30/14 2156 08/31/14 0500  NA 140 141 144  K 3.9 3.9 3.8  CL 104 102 108  CO2 24  --  25  GLUCOSE 101* 103* 96  BUN 9 8 8   CREATININE 0.69 0.80 0.62  0.63  CALCIUM 8.8  --  8.7    CBG:  Recent Labs Lab 08/31/14 0620  GLUCAP 106*    GFR Estimated Creatinine Clearance: 74 mL/min (by C-G formula based on Cr of 0.62).  Coagulation profile  Recent Labs Lab 08/30/14 2143  INR 1.12    Cardiac Enzymes No results for input(s): CKMB, TROPONINI, MYOGLOBIN in the last 168 hours.  Invalid input(s): CK  Invalid input(s): POCBNP No results for input(s): DDIMER in the last 72 hours. No results for input(s): HGBA1C in the last 72  hours.  Recent Labs  08/31/14 0500  CHOL 103  HDL 48  LDLCALC 46  TRIG 47  CHOLHDL 2.1    Recent Labs  08/31/14 0500  TSH 2.190   No results for input(s): VITAMINB12, FOLATE, FERRITIN, TIBC, IRON, RETICCTPCT in the last 72 hours. No results for input(s): LIPASE, AMYLASE in the last 72 hours.  Urine Studies No results for input(s): UHGB, CRYS in the last 72 hours.  Invalid input(s): UACOL, UAPR, USPG, UPH, UTP, UGL, UKET, UBIL, UNIT, UROB, ULEU, UEPI, UWBC, URBC, UBAC, CAST, UCOM, BILUA  MICROBIOLOGY: No results found for this or any previous visit (from the past 240 hour(s)).  RADIOLOGY STUDIES/RESULTS: Ct Head (brain) Wo Contrast  08/30/2014   CLINICAL DATA:  Right-sided weakness.  EXAM: CT HEAD WITHOUT CONTRAST  TECHNIQUE: Contiguous axial images were obtained from the base of the skull through the vertex without intravenous contrast.  COMPARISON:  None.  FINDINGS: No acute intracranial abnormality. Specifically, no hemorrhage, hydrocephalus, mass lesion, acute infarction, or significant intracranial injury. No acute calvarial abnormality. Visualized paranasal sinuses and mastoids clear. Orbital soft tissues unremarkable.  IMPRESSION: Negative.  Critical Value/emergent results were called by telephone at the time of interpretation on 08/30/2014 at 9:57 pm to Dr. Roseanne RenoStewart , who verbally acknowledged these results.   Electronically Signed   By: Charlett NoseKevin  Dover M.D.   On: 08/30/2014 21:57   Dg Chest Port 1 View  08/31/2014   CLINICAL DATA:  Tobacco abuse. History of Raynaud's disease, heart murmur, hypercholesterolemia.  EXAM: PORTABLE CHEST - 1 VIEW  COMPARISON:  None.  FINDINGS: Heart size is upper limits normal. Mild perihilar bronchitic changes are identified. Lungs are otherwise clear. No pulmonary edema. Visualized osseous structures have a normal appearance.  IMPRESSION: 1. Mild bronchitic change. 2.  No evidence for acute cardiopulmonary abnormality.   Electronically Signed    By: Rosalie GumsBeth  Brown M.D.   On: 08/31/2014 09:47    Jeoffrey MassedGHIMIRE,SHANKER, MD  [facility] Pager:[phone]  If 7PM-7AM, please contact night-coverage [email or web address] Password [email or web address] 08/31/2014, 10:23 AM   LOS: 1 day

## 2014-08-31 NOTE — Progress Notes (Signed)
Patient arrived around 0100 with suspected TIA, majority of symptoms have resolved not complaining of any discomfort except hunger she is NPO until speech therapy performs swallow screen and after fasting lipid test in AM patient resting quietly will continue to monitor.

## 2014-08-31 NOTE — Evaluation (Signed)
Clinical/Bedside Swallow Evaluation Patient Details  Name: Dawn [name] MRN: [id number] Date of Birth: 04-28-1961  Today's Date: 08/31/2014 Time: 0928-0950 SLP Time Calculation (min) (ACUTE ONLY): 22 min  Past Medical History:  Past Medical History  Diagnosis Date  . PTSD (post-traumatic stress disorder)   . Raynaud's disease   . Heart murmur   . Anxiety   . Hypercholesteremia    Past Surgical History:  Past Surgical History  Procedure Laterality Date  . Wrist surgery    . Abdominal hysterectomy    . Knee surgery    . Hip surgery  Right   HPI:  Pt is a 53 y.o. female with hx of Raynaud's disease, hyperlipidemia, anxiety, tobacco abuse, developede R upper and lower extremety weakkness on 11/25. Head CT 11/25 negative for acute abnormalities but suspected TIA. Pt failed stroke swallow screen because of possible hx of dysphagia- pt stated that she must have liquids with food to get it down, says MD is ruling out esophageal stricture. SLP eval ordered due to failed stroke swallow screen.     Assessment / Plan / Recommendation Clinical Impression  The pt demonstrated no overt s/s of aspiration at bedside; swallow was timely with adequate hyolaryngeal excursion. Pt described a hx of esophageal issues and stated that endoscopy appointment is scheduled. No new symptoms indicating oropharyngeal deficits; pt reported having decreased sensation on R side yesterday but this has resolved. Multiple swallows required across consistencies and globus sensation are both possibly indicative of esophageal issues. Recommend initiating regular diet, thin liquids, meds whole with liquid; if difficulties arise try whole in puree. Educated pt on reflux precautions- small bites/ sips, alternating foods with liquids, sitting upright for meals and at least 30 minutes after meal. ST will sign off as there are no acute needs at this time. Please re-consult if difficulties arise.    Aspiration Risk  Mild     Diet Recommendation Regular;Thin liquid   Liquid Administration via: Cup;Straw Medication Administration: Whole meds with liquid Supervision: Patient able to self feed;Intermittent supervision to cue for compensatory strategies Compensations: Slow rate;Small sips/bites;Follow solids with liquid Postural Changes and/or Swallow Maneuvers: Seated upright 90 degrees;Upright 30-60 min after meal    Other  Recommendations Recommended Consults: Consider esophageal assessment Oral Care Recommendations: Oral care BID   Follow Up Recommendations  None    Frequency and Duration        Pertinent Vitals/Pain n/a    SLP Swallow Goals     Swallow Study Prior Functional Status       General HPI: Pt is a 53 y.o. female with hx of Raynaud's disease, hyperlipidemia, anxiety, tobacco abuse, developede R upper and lower extremety weakkness on 11/25. Head CT 11/25 negative for acute abnormalities but suspected TIA. Pt failed stroke swallow screen because of possible hx of dysphagia- pt stated that she must have liquids with food to get it down, says MD is ruling out esophageal stricture. SLP eval ordered due to failed stroke swallow screen.   Type of Study: Bedside swallow evaluation Diet Prior to this Study: NPO Temperature Spikes Noted: No Respiratory Status: Room air History of Recent Intubation: No Behavior/Cognition: Alert;Cooperative Oral Cavity - Dentition: Adequate natural dentition Self-Feeding Abilities: Able to feed self Patient Positioning: Upright in bed Baseline Vocal Quality: Clear Volitional Cough: Strong Volitional Swallow: Able to elicit    Oral/Motor/Sensory Function Overall Oral Motor/Sensory Function: Appears within functional limits for tasks assessed Labial ROM: Within Functional Limits Labial Symmetry: Within Functional Limits Labial Strength: Within Functional  Limits Labial Sensation: Within Functional Limits Lingual ROM: Within Functional Limits Lingual Symmetry:  Within Functional Limits Lingual Strength: Within Functional Limits Lingual Sensation: Within Functional Limits   Ice Chips Ice chips: Not tested   Thin Liquid Thin Liquid: Impaired Presentation: Cup;Straw Pharyngeal  Phase Impairments: Multiple swallows    Nectar Thick Nectar Thick Liquid: Not tested   Honey Thick Honey Thick Liquid: Not tested   Puree Puree: Within functional limits Presentation: Self Fed   Solid   GO Functional Assessment Tool Used: clinical judgment Functional Limitations: Swallowing Swallow Current Status [phone](G8996): At least 1 percent but less than 20 percent impaired, limited or restricted Swallow Goal Status [phone]([id number]): At least 1 percent but less than 20 percent impaired, limited or restricted Swallow Discharge Status [phone]([id number]): At least 1 percent but less than 20 percent impaired, limited or restricted  Solid: Impaired Presentation: Self Fed Pharyngeal Phase Impairments: Multiple swallows       Clarabell Matsuoka K, MA, CCC-SLP 08/31/2014,10:03 AM

## 2014-08-31 NOTE — H&P (Signed)
[facility] History and Physical  Dawn [name] ZHY:[id number] DOB: December 11, 1960 DOA: 08/30/2014  Referring physician: ER physician. PCP: No PCP Per Patient patient [place] from [place].  Chief Complaint: Right-sided weakness.  HPI: Dawn Escobar is a 53 y.o. female with history of Raynaud's disease, hyperlipidemia, anxiety and ongoing tobacco abuse started developing sudden onset of right upper and lower extremity weakness around 8:30 to 8:40 PM. Patient states that prior to this incident patient few minutes ago had some shaking spells but did not lose consciousness. Patient was brought to the ER and on-call neurologist Dr. Noel Christmasharles Escobar was consulted. After arrival in the ER patient's weakness is largely improved. Dr. Noel Christmasharles Escobar was once patient was admitted for possible TIA and further workup. Patient denies any chest pain shortness of breath headache visual symptoms difficulty swallowing. There was some difficulty speaking initially.   Review of Systems: As presented in the history of presenting illness, rest negative.  Past Medical History  Diagnosis Date  . PTSD (post-traumatic stress disorder)   . Raynaud's disease   . Heart murmur   . Anxiety   . Hypercholesteremia    Past Surgical History  Procedure Laterality Date  . Wrist surgery    . Abdominal hysterectomy    . Knee surgery    . Hip surgery  Right   Social History:  reports that she has been smoking.  She does not have any smokeless tobacco history on file. She reports that she drinks alcohol. She reports that she does not use illicit drugs. Where does patient live home. Can patient participate in ADLs? Yes.  Allergies  Allergen Reactions  . Morphine And Related     Family History:  Family History  Problem Relation Age of Onset  . Colon cancer Brother       Prior to Admission medications   Not on File    Physical Exam: Filed Vitals:   08/30/14 2315 08/30/14 2356 08/31/14 0000  08/31/14 0045  BP: 145/77 153/86 143/92 151/79  Pulse: 69 67 70 37  Temp:  97.9 F (36.6 C)    TempSrc:      Resp: 14 16 13 16   SpO2: 99% 99% 100% 95%     General:  Well-developed and nourished.  Eyes: Anicteric no pallor.  ENT: No discharge from the ears eyes nose mouth.  Neck: No mass felt. No neck rigidity.  Cardiovascular: S1 and S2 heard.  Respiratory: No rhonchi or crepitations.  Abdomen: Soft nontender bowel sounds present. No guarding or rigidity.  Skin: No rash.  Musculoskeletal: No edema.  Psychiatric: Appears normal.  Neurologic: Alert awake oriented to time place and person. Has mild weakness of the right lower extremity rest of the extremities are 5 x 5 in strength. No facial asymmetry. Tongue is midline. PERRLA positive.  Labs on Admission:  Basic Metabolic Panel:  Recent Labs Lab 08/30/14 2143 08/30/14 2156  NA 140 141  K 3.9 3.9  CL 104 102  CO2 24  --   GLUCOSE 101* 103*  BUN 9 8  CREATININE 0.69 0.80  CALCIUM 8.8  --    Liver Function Tests:  Recent Labs Lab 08/30/14 2143  AST 14  ALT 8  ALKPHOS 71  BILITOT 0.2*  PROT 7.1  ALBUMIN 3.7   No results for input(s): LIPASE, AMYLASE in the last 168 hours. No results for input(s): AMMONIA in the last 168 hours. CBC:  Recent Labs Lab 08/30/14 2143 08/30/14 2156  WBC 7.4  --   NEUTROABS  4.9  --   HGB 12.0 12.9  HCT 35.7* 38.0  MCV 94.7  --   PLT 158  --    Cardiac Enzymes: No results for input(s): CKTOTAL, CKMB, CKMBINDEX, TROPONINI in the last 168 hours.  BNP (last 3 results) No results for input(s): PROBNP in the last 8760 hours. CBG: No results for input(s): GLUCAP in the last 168 hours.  Radiological Exams on Admission: Ct Head (brain) Wo Contrast  08/30/2014   CLINICAL DATA:  Right-sided weakness.  EXAM: CT HEAD WITHOUT CONTRAST  TECHNIQUE: Contiguous axial images were obtained from the base of the skull through the vertex without intravenous contrast.  COMPARISON:   None.  FINDINGS: No acute intracranial abnormality. Specifically, no hemorrhage, hydrocephalus, mass lesion, acute infarction, or significant intracranial injury. No acute calvarial abnormality. Visualized paranasal sinuses and mastoids clear. Orbital soft tissues unremarkable.  IMPRESSION: Negative.  Critical Value/emergent results were called by telephone at the time of interpretation on 08/30/2014 at 9:57 pm to Dr. Roseanne Escobar , who verbally acknowledged these results.   Electronically Signed   By: Charlett NoseKevin  Escobar M.D.   On: 08/30/2014 21:57    EKG: Independently reviewed. Normal sinus rhythm.  Assessment/Plan Principal Problem:   TIA (transient ischemic attack) Active Problems:   Hypercholesterolemia   1. TIA - at this time neurologist feels that patient may have possible TIA versus psychosocial. Patient has been placed on neuro checks and swallow evaluation. Check MRI/MRA brain 2-D echo and carotid Dopplers. Aspirin. 2. Tobacco abuse - patient advised to quit cigarette smoking. 3. Hyperlipidemia - on Crestor. 4. History of anxiety - on when necessary clonazepam. 5. History of Reynauds.    Code Status: Full code.  Family Communication: None.  Disposition Plan: Admit for observation.    Dawn Escobar N. [facility] Pager [phone].  If 7PM-7AM, please contact night-coverage [email or web address] Password [email or web address] 08/31/2014, 12:53 AM

## 2014-09-01 ENCOUNTER — Observation Stay (HOSPITAL_COMMUNITY): Payer: Medicare Other

## 2014-09-01 DIAGNOSIS — I63032 Cerebral infarction due to thrombosis of left carotid artery: Secondary | ICD-10-CM

## 2014-09-01 DIAGNOSIS — I63422 Cerebral infarction due to embolism of left anterior cerebral artery: Secondary | ICD-10-CM | POA: Diagnosis not present

## 2014-09-01 DIAGNOSIS — I359 Nonrheumatic aortic valve disorder, unspecified: Secondary | ICD-10-CM

## 2014-09-01 DIAGNOSIS — G458 Other transient cerebral ischemic attacks and related syndromes: Secondary | ICD-10-CM

## 2014-09-01 LAB — GLUCOSE, CAPILLARY
GLUCOSE-CAPILLARY: 104 mg/dL — AB (ref 70–99)
GLUCOSE-CAPILLARY: 103 mg/dL — AB (ref 70–99)
GLUCOSE-CAPILLARY: 102 mg/dL — AB (ref 70–99)
Glucose-Capillary: 101 mg/dL — ABNORMAL HIGH (ref 70–99)

## 2014-09-01 MED ORDER — DOXAZOSIN MESYLATE 2 MG PO TABS
2.0000 mg | ORAL_TABLET | Freq: Every day | ORAL | Status: DC
  Administered 2014-09-01 – 2014-09-05 (×5): 2 mg via ORAL
  Filled 2014-09-01 (×6): qty 1

## 2014-09-01 MED ORDER — IBUPROFEN 600 MG PO TABS
600.0000 mg | ORAL_TABLET | Freq: Once | ORAL | Status: AC
  Administered 2014-09-01: 600 mg via ORAL
  Filled 2014-09-01: qty 1

## 2014-09-01 MED ORDER — ENSURE COMPLETE PO LIQD
237.0000 mL | Freq: Two times a day (BID) | ORAL | Status: DC
  Administered 2014-09-02 – 2014-09-05 (×7): 237 mL via ORAL

## 2014-09-01 MED ORDER — TRAMADOL HCL 50 MG PO TABS
50.0000 mg | ORAL_TABLET | Freq: Four times a day (QID) | ORAL | Status: DC | PRN
  Administered 2014-09-01 – 2014-09-05 (×10): 50 mg via ORAL
  Filled 2014-09-01 (×10): qty 1

## 2014-09-01 MED ORDER — ROSUVASTATIN CALCIUM 20 MG PO TABS
20.0000 mg | ORAL_TABLET | Freq: Every day | ORAL | Status: DC
  Administered 2014-09-01 – 2014-09-05 (×5): 20 mg via ORAL
  Filled 2014-09-01 (×6): qty 1

## 2014-09-01 NOTE — Progress Notes (Signed)
UR completed 

## 2014-09-01 NOTE — Progress Notes (Signed)
INITIAL NUTRITION ASSESSMENT  DOCUMENTATION CODES Per approved criteria  -Not Applicable   INTERVENTION: Recommend Regular Diet Provide snacks TID Provide Ensure Complete BID with meals, each supplement provides 350 kcal and 13 grams of protein  NUTRITION DIAGNOSIS: Unintentional weight loss related to unknown origin as evidenced by reported >28% weight loss in less than 2 years.   Goal: Pt to meet >/= 90% of their estimated nutrition needs   Monitor:  PO intake, weight trend, labs  Reason for Assessment: Malnutrition Screening Tool, score of 2  53 y.o. female  Admitting Dx: TIA (transient ischemic attack)  ASSESSMENT: 53 y.o. female with history of Raynaud's disease, hyperlipidemia, anxiety and ongoing tobacco abuse started developing sudden onset of right upper and lower extremity weakness.  Patient reports that she has been unintentionally losing weight for about 2 years. She states that 2 years ago she weighed over 200 lbs and has recently lost down to 143 lbs. She reports losing 10 lbs in one month a few months ago. She saw her PCP and had tests done but, nothing was found. Patient reports that she has a great appetite, eats well and regularly. Patient does seem to have moderate fat and muscle wasting. RD encouraged patient to continue monitoring her weight and eating well. Encouraged patient to continue follow-ups with PCP if weight loss continues. Discussed tips for adding calories to prevent further rapid weight loss.   Nutrition Focused Physical Exam:  Subcutaneous Fat:  Orbital Region: mild wasting Upper Arm Region: moderate wasting Thoracic and Lumbar Region: NA  Muscle:  Temple Region: mild to moderate wasting Clavicle Bone Region: moderate wasting Clavicle and Acromion Bone Region: mild wasting Scapular Bone Region: NA Dorsal Hand: mild wasting Patellar Region: moderate wasting Anterior Thigh Region: moderate wasting Posterior Calf Region: mild  wasting  Edema: none noted   Height: Ht Readings from Last 1 Encounters:  08/31/14 5\' 5"  (1.651 m)    Weight: Wt Readings from Last 1 Encounters:  08/31/14 148 lb (67.132 kg)    Ideal Body Weight: 125 lbs  % Ideal Body Weight: 118%  Wt Readings from Last 10 Encounters:  08/31/14 148 lb (67.132 kg)    Usual Body Weight: 200 lbs+  % Usual Body Weight: 74%  BMI:  Body mass index is 24.63 kg/(m^2).  Estimated Nutritional Needs: Kcal: 2000-2200 Protein: 70-80 grams Fluid: 2-2.2 L/day  Skin: intact  Diet Order: Diet Heart  EDUCATION NEEDS: -No education needs identified at this time  No intake or output data in the 24 hours ending 09/01/14 1534  Last BM: 11/25   Labs:   Recent Labs Lab 08/30/14 2143 08/30/14 2156 08/31/14 0500  NA 140 141 144  K 3.9 3.9 3.8  CL 104 102 108  CO2 24  --  25  BUN 9 8 8   CREATININE 0.69 0.80 0.62  0.63  CALCIUM 8.8  --  8.7  GLUCOSE 101* 103* 96    CBG (last 3)   Recent Labs  08/31/14 2137 09/01/14 0635 09/01/14 1108  GLUCAP 125* 101* 104*    Scheduled Meds: . aspirin  325 mg Oral Daily  . doxazosin  2 mg Oral QHS  . enoxaparin (LOVENOX) injection  40 mg Subcutaneous Q24H  . gabapentin  800 mg Oral TID  . pantoprazole  40 mg Oral Daily  . rosuvastatin  20 mg Oral q1800    Continuous Infusions:   Past Medical History  Diagnosis Date  . PTSD (post-traumatic stress disorder)   . Raynaud's  disease   . Heart murmur   . Anxiety   . Hypercholesteremia     Past Surgical History  Procedure Laterality Date  . Wrist surgery    . Abdominal hysterectomy    . Knee surgery    . Hip surgery  Right    Ian Malkineanne Barnett RD, LDN Inpatient Clinical Dietitian Pager: [phone] After Hours Pager: [phone]

## 2014-09-01 NOTE — Plan of Care (Signed)
Problem: Progression Outcomes Goal: Communication method established Outcome: Completed/Met Date Met:  09/01/14
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
## 2014-09-01 NOTE — Progress Notes (Signed)
  Echocardiogram 2D Echocardiogram has been performed.  Dawn Escobar, Dawn Escobar 09/01/2014, 9:02 AM

## 2014-09-01 NOTE — Progress Notes (Signed)
STROKE TEAM PROGRESS NOTE   HISTORY Dawn Escobar is a 53 y.o. female with a history of hypercholesterolemia, posttraumatic stress disorder, Raynaud's disease and anxiety who experienced acute onset of right-sided weakness and numbness at 8:40 PM on 08/31/14.Marland Kitchen. She has no previous history of stroke nor TIA. She has not been on antiplatelet therapy. CT scan of her head showed no acute intracranial abnormality. Deficits improved after arriving in the emergency room, including return of her speech to normal as well as use of her right upper extremity. Deficits involving right lower extremity as well as sensory complaints were felt to likely be functional in etiology. Because her symptoms were improving and there were no clear objective deficits, TPA was not administered. NIH stroke score was 5.  LSN: 8:40 PM on 08/30/2014 tPA Given: No: Improving deficits and lack of clear objective remaining focal deficits   SUBJECTIVE (INTERVAL HISTORY) Her RN  is at the bedside.  Overall she feels her condition is rapidly improving. .   OBJECTIVE Temp:  [98.2 F (36.8 C)-99.5 F (37.5 C)] 98.6 F (37 C) (11/27 0602) Pulse Rate:  [54-68] 55 (11/27 0602) Cardiac Rhythm:  [-] Normal sinus rhythm (11/26 2000) Resp:  [18-20] 20 (11/27 0602) BP: (122-142)/(59-72) 122/62 mmHg (11/27 0602) SpO2:  [98 %-100 %] 98 % (11/27 0602)   Recent Labs Lab 08/31/14 0620 08/31/14 1119 08/31/14 1654 08/31/14 2137 09/01/14 0635  GLUCAP 106* 136* 110* 125* 101*    Recent Labs Lab 08/30/14 2143 08/30/14 2156 08/31/14 0500  NA 140 141 144  K 3.9 3.9 3.8  CL 104 102 108  CO2 24  --  25  GLUCOSE 101* 103* 96  BUN 9 8 8   CREATININE 0.69 0.80 0.62  0.63  CALCIUM 8.8  --  8.7    Recent Labs Lab 08/30/14 2143 08/31/14 0500  AST 14 12  ALT 8 7  ALKPHOS 71 65  BILITOT 0.2* 0.3  PROT 7.1 6.5  ALBUMIN 3.7 3.3*    Recent Labs Lab 08/30/14 2143 08/30/14 2156 08/31/14 0500  WBC 7.4  --  6.4  6.7   NEUTROABS 4.9  --  3.6  HGB 12.0 12.9 11.8*  11.6*  HCT 35.7* 38.0 35.0*  34.2*  MCV 94.7  --  94.3  92.9  PLT 158  --  162  164   No results for input(s): CKTOTAL, CKMB, CKMBINDEX, TROPONINI in the last 168 hours.  Recent Labs  08/30/14 2143  LABPROT 14.6  INR 1.12    Recent Labs  08/30/14 2237  COLORURINE YELLOW  LABSPEC 1.005  PHURINE 7.0  GLUCOSEU NEGATIVE  HGBUR MODERATE*  BILIRUBINUR NEGATIVE  KETONESUR NEGATIVE  PROTEINUR NEGATIVE  UROBILINOGEN 1.0  NITRITE NEGATIVE  LEUKOCYTESUR SMALL*       Component Value Date/Time   CHOL 103 08/31/2014 0500   TRIG 47 08/31/2014 0500   HDL 48 08/31/2014 0500   CHOLHDL 2.1 08/31/2014 0500   VLDL 9 08/31/2014 0500   LDLCALC 46 08/31/2014 0500   Lab Results  Component Value Date   HGBA1C 5.6 08/31/2014      Component Value Date/Time   LABOPIA NONE DETECTED 08/31/2014 0254   COCAINSCRNUR NONE DETECTED 08/31/2014 0254   LABBENZ NONE DETECTED 08/31/2014 0254   AMPHETMU NONE DETECTED 08/31/2014 0254   THCU NONE DETECTED 08/31/2014 0254   LABBARB NONE DETECTED 08/31/2014 0254    No results for input(s): ETH in the last 168 hours.  Ct Head (brain) Wo Contrast 08/30/2014    Negative.  Dg Chest Port 1 View 08/31/2014    1. Mild bronchitic change.  2.  No evidence for acute cardiopulmonary abnormality.    MRI / MRA 09/01/2014 1. Left ACA territory infarcts in the high medial left frontal lobe without hemorrhage. 2. No focal stenosis or ACA occlusion. 3. No other significant white matter disease or other acute infarcts.     PHYSICAL EXAM Middle aged african american lady not in distress. Awake alert. Afebrile. Head is nontraumatic. Neck is supple without bruit. Hearing is normal. Cardiac exam no murmur or gallop. Lungs are clear to auscultation. Distal pulses are well felt. Neurological Exam : Awake alert oriented x 3 normal speech and language. Mild left lower face asymmetry. Tongue midline. No drift.  Mild diminished fine finger movements on right. Orbits left over right upper extremity. Mild right grip weak.. Normal sensation . Normal coordination.  ASSESSMENT/PLAN [name] is a 53 y.o. female with history of hypercholesterolemia, posttraumatic stress disorder, Raynaud's disease, tobacco use, and anxiety, presenting with right hemiparesis and hemisensory deficits. She did not receive IV t-PA due to resolving deficits.    Stroke:  Dominant. Head CT negative. MRI/MRA - see below. Probably embolic.  Resultant   Mild right leg weakness  MRI - Left ACA territory infarcts in the high medial left frontal lobe   MRA - no high-grade stenosis  Carotid Doppler - Bilateral 1-39% ICA stenosis. Vertebral artery flow is antegrade.   2D Echo - pending  LDL- 46  HgbA1c 5.6  Lovenox for VTE prophylaxis  Diet Heart no liquids  No antithrombotics prior to admission, now on aspirin 325 mg orally every day  Patient counseled to be compliant with her antithrombotic medications  Ongoing aggressive stroke risk factor management  Therapy recommendations:  Pending  Disposition:  Pending  Hypertension  Home meds:   Cardura 12 mg daily  Stable  Hyperlipidemia  Home meds:  Crestor 20 mg daily resumed in hospital at 40 mg daily  LDL 46, goal < 70  Continue statin at discharge   Other Stroke Risk Factors  Cigarette smoker, advised to stop smoking  ETOH use   Other Active Problems  Mild anemia  Other Pertinent History  Tobacco use  Hospital day # 2  Delton SeeDavid Rinehuls PA-C [facility] Pager [phone] 09/01/2014, 9:45 AM I have personally examined this patient, reviewed notes, independently viewed imaging studies, participated in medical decision making and plan of care. I have made any additions or clarifications directly to the above note. Agree with note above.Suspect embolic etiology of stroke. Will need TEE and heart monitor next  week.  Delia HeadyPramod Domique Clapper, MD Medical Director [facility] Pager: [phone] 09/01/2014 4:27 PM     To contact Stroke Continuity provider, please refer to [email or web address]. After hours, contact General Neurology

## 2014-09-01 NOTE — Progress Notes (Signed)
PATIENT DETAILS Name: Dawn OvensDenise Koors Age: 53 y.o. Sex: female Date of Birth: 12/12/1960 Admit Date: 08/30/2014 Admitting Physician Eduard ClosArshad N Kakrakandy, MD PCP:No PCP Per Patient  Subjective: Headache  Assessment/Plan: Principal Problem: Acute CVA: Admitted with right upper/right lower extremity weakness and aphasia. Symptoms almost resolved, speech still not fluent but almost back to baseline. Very minimal right-sided weakness on exam. MRI brain confirms left ACA territory infarct. TTE pending.Carotid Doppler negative for significant stenosis. LDL at goal at 46, continue statins. Hemoglobin A1c 5.6 . Currently on aspirin, continue. Await further recommendations from stroke team.   Active Problems:   Hypercholesterolemia: As above. Continue statin    Tobacco abuse: Also extensively, advised to quit.    History of the raynaud's phenomena:resume Doxazosin-but at a lower dose today      History of GERD: Continue with PPI    History of anxiety: Continue with when necessary clonazepam.    Disposition: Remain inpatient-Home when work up complete  Antibiotics:  None   Anti-infectives    None      DVT Prophylaxis: Prophylactic Lovenox  Code Status: Full code   Family Communication None at bedside  Procedures:  None  CONSULTS:  neurology   MEDICATIONS: Scheduled Meds: . aspirin  325 mg Oral Daily  . enoxaparin (LOVENOX) injection  40 mg Subcutaneous Q24H  . gabapentin  800 mg Oral TID  . ibuprofen  600 mg Oral Once  . pantoprazole  40 mg Oral Daily  . rosuvastatin  20 mg Oral q1800   Continuous Infusions:   PRN Meds:.acetaminophen, clonazePAM, cyclobenzaprine, traZODone  PHYSICAL EXAM: Vital signs in last 24 hours: Filed Vitals:   08/31/14 2137 09/01/14 0139 09/01/14 0602 09/01/14 1011  BP: 136/72 122/59 122/62 120/57  Pulse: 68 54 55   Temp: 99.5 F (37.5 C) 98.2 F (36.8 C) 98.6 F (37 C) 98.3 F (36.8 C)  TempSrc: Oral Oral Oral Oral   Resp: 18 20 20 18   Height:      Weight:      SpO2: 99% 99% 98% 98%    Weight change:  Filed Weights   08/31/14 0100  Weight: 67.132 kg (148 lb)   Body mass index is 24.63 kg/(m^2).   Gen Exam: Awake and alert with clear speech-but at times non fluent.   Neck: Supple, No JVD.   Chest: B/L Clear.   CVS: S1 S2 Regular, no murmurs.  Abdomen: soft, BS +, non tender, non distended.  Extremities: no edema, lower extremities warm to touch. Neurologic:Very minimal RUE/RLE weakness  Skin: No Rash.   Wounds: N/A.    Intake/Output from previous day: No intake or output data in the 24 hours ending 09/01/14 1118   LAB RESULTS: CBC  Recent Labs Lab 08/30/14 2143 08/30/14 2156 08/31/14 0500  WBC 7.4  --  6.4  6.7  HGB 12.0 12.9 11.8*  11.6*  HCT 35.7* 38.0 35.0*  34.2*  PLT 158  --  162  164  MCV 94.7  --  94.3  92.9  MCH 31.8  --  31.8  31.5  MCHC 33.6  --  33.7  33.9  RDW 12.7  --  12.7  12.8  LYMPHSABS 2.0  --  2.0  MONOABS 0.4  --  0.5  EOSABS 0.1  --  0.2  BASOSABS 0.0  --  0.1    Chemistries   Recent Labs Lab 08/30/14 2143 08/30/14 2156 08/31/14 0500  NA 140 141 144  K  3.9 3.9 3.8  CL 104 102 108  CO2 24  --  25  GLUCOSE 101* 103* 96  BUN 9 8 8   CREATININE 0.69 0.80 0.62  0.63  CALCIUM 8.8  --  8.7    CBG:  Recent Labs Lab 08/31/14 1119 08/31/14 1654 08/31/14 2137 09/01/14 0635 09/01/14 1108  GLUCAP 136* 110* 125* 101* 104*    GFR Estimated Creatinine Clearance: 74 mL/min (by C-G formula based on Cr of 0.62).  Coagulation profile  Recent Labs Lab 08/30/14 2143  INR 1.12    Cardiac Enzymes No results for input(s): CKMB, TROPONINI, MYOGLOBIN in the last 168 hours.  Invalid input(s): CK  Invalid input(s): POCBNP No results for input(s): DDIMER in the last 72 hours.  Recent Labs  08/31/14 0500  HGBA1C 5.6    Recent Labs  08/31/14 0500  CHOL 103  HDL 48  LDLCALC 46  TRIG 47  CHOLHDL 2.1    Recent Labs   08/31/14 0500  TSH 2.190   No results for input(s): VITAMINB12, FOLATE, FERRITIN, TIBC, IRON, RETICCTPCT in the last 72 hours. No results for input(s): LIPASE, AMYLASE in the last 72 hours.  Urine Studies No results for input(s): UHGB, CRYS in the last 72 hours.  Invalid input(s): UACOL, UAPR, USPG, UPH, UTP, UGL, UKET, UBIL, UNIT, UROB, ULEU, UEPI, UWBC, URBC, UBAC, CAST, UCOM, BILUA  MICROBIOLOGY: No results found for this or any previous visit (from the past 240 hour(s)).  RADIOLOGY STUDIES/RESULTS: Ct Head (brain) Wo Contrast  08/30/2014   CLINICAL DATA:  Right-sided weakness.  EXAM: CT HEAD WITHOUT CONTRAST  TECHNIQUE: Contiguous axial images were obtained from the base of the skull through the vertex without intravenous contrast.  COMPARISON:  None.  FINDINGS: No acute intracranial abnormality. Specifically, no hemorrhage, hydrocephalus, mass lesion, acute infarction, or significant intracranial injury. No acute calvarial abnormality. Visualized paranasal sinuses and mastoids clear. Orbital soft tissues unremarkable.  IMPRESSION: Negative.  Critical Value/emergent results were called by telephone at the time of interpretation on 08/30/2014 at 9:57 pm to Dr. Roseanne RenoStewart , who verbally acknowledged these results.   Electronically Signed   By: Charlett NoseKevin  Dover M.D.   On: 08/30/2014 21:57   Mri Brain Without Contrast  09/01/2014   CLINICAL DATA:  Right upper and lower extremity weakness and aphasia. The extremity symptoms have near completely resolved. The patient continues to have some speech difficulties.  EXAM: MRI HEAD WITHOUT CONTRAST  MRA HEAD WITHOUT CONTRAST  TECHNIQUE: Multiplanar, multiecho pulse sequences of the brain and surrounding structures were obtained without intravenous contrast. Angiographic images of the head were obtained using MRA technique without contrast.  COMPARISON:  CT head without contrast 08/30/2014.  FINDINGS: MRI HEAD FINDINGS  Scattered foci of restricted diffusion  are present within the cortical and subcortical regions of the medial right frontal lobe, following the left ACA distribution. T2 changes are associated with the infarct, cysts compatible with a subacute time frame. No other acute infarcts are present. There is no significant white matter disease. The ventricles are of normal size. No significant extraaxial fluid collection is present.  Flow is present in the major intracranial arteries. The patient is status post bilateral lens replacements. Midline structures are normal. The globes and orbits are otherwise intact. Mild mucosal thickening is present within the ethmoid air cells and sphenoid sinuses bilaterally.  MRA HEAD FINDINGS  The internal carotid arteries are within normal limits from the high cervical segments through the ICA termini bilaterally. The A1 and M1 segments  are normal. Anterior communicating artery is patent. The MCA bifurcations are intact bilaterally. The ACA and MCA branch vessels are within normal limits.  The left vertebral artery is the dominant vessel. The right vertebral artery essentially terminates at the PICA sending only a very small branch to the vertebrobasilar junction. The basilar artery is within normal limits. A prominent left posterior communicating artery is present. A smaller right posterior communicating artery is present. P1 segments are evident bilaterally. The PCA branch vessels are within normal limits.  IMPRESSION: 1. Left ACA territory infarcts in the high medial left frontal lobe without hemorrhage. 2. No focal stenosis or ACA occlusion. 3. No other significant white matter disease or other acute infarcts. These results will be called to the ordering clinician or representative by the Radiologist Assistant, and communication documented in the PACS or zVision Dashboard.   Electronically Signed   By: Gennette Pac M.D.   On: 09/01/2014 10:18   Dg Chest Port 1 View  08/31/2014   CLINICAL DATA:  Tobacco abuse. History  of Raynaud's disease, heart murmur, hypercholesterolemia.  EXAM: PORTABLE CHEST - 1 VIEW  COMPARISON:  None.  FINDINGS: Heart size is upper limits normal. Mild perihilar bronchitic changes are identified. Lungs are otherwise clear. No pulmonary edema. Visualized osseous structures have a normal appearance.  IMPRESSION: 1. Mild bronchitic change. 2.  No evidence for acute cardiopulmonary abnormality.   Electronically Signed   By: Rosalie Gums M.D.   On: 08/31/2014 09:47   Mr Maxine Glenn Head/brain Wo Cm  09/01/2014   CLINICAL DATA:  Right upper and lower extremity weakness and aphasia. The extremity symptoms have near completely resolved. The patient continues to have some speech difficulties.  EXAM: MRI HEAD WITHOUT CONTRAST  MRA HEAD WITHOUT CONTRAST  TECHNIQUE: Multiplanar, multiecho pulse sequences of the brain and surrounding structures were obtained without intravenous contrast. Angiographic images of the head were obtained using MRA technique without contrast.  COMPARISON:  CT head without contrast 08/30/2014.  FINDINGS: MRI HEAD FINDINGS  Scattered foci of restricted diffusion are present within the cortical and subcortical regions of the medial right frontal lobe, following the left ACA distribution. T2 changes are associated with the infarct, cysts compatible with a subacute time frame. No other acute infarcts are present. There is no significant white matter disease. The ventricles are of normal size. No significant extraaxial fluid collection is present.  Flow is present in the major intracranial arteries. The patient is status post bilateral lens replacements. Midline structures are normal. The globes and orbits are otherwise intact. Mild mucosal thickening is present within the ethmoid air cells and sphenoid sinuses bilaterally.  MRA HEAD FINDINGS  The internal carotid arteries are within normal limits from the high cervical segments through the ICA termini bilaterally. The A1 and M1 segments are normal.  Anterior communicating artery is patent. The MCA bifurcations are intact bilaterally. The ACA and MCA branch vessels are within normal limits.  The left vertebral artery is the dominant vessel. The right vertebral artery essentially terminates at the PICA sending only a very small branch to the vertebrobasilar junction. The basilar artery is within normal limits. A prominent left posterior communicating artery is present. A smaller right posterior communicating artery is present. P1 segments are evident bilaterally. The PCA branch vessels are within normal limits.  IMPRESSION: 1. Left ACA territory infarcts in the high medial left frontal lobe without hemorrhage. 2. No focal stenosis or ACA occlusion. 3. No other significant white matter disease or other acute  infarcts. These results will be called to the ordering clinician or representative by the Radiologist Assistant, and communication documented in the PACS or zVision Dashboard.   Electronically Signed   By: Gennette Pac M.D.   On: 09/01/2014 10:18    Jeoffrey Massed, MD  [facility] Pager:[phone]  If 7PM-7AM, please contact night-coverage [email or web address] Password [email or web address] 09/01/2014, 11:18 AM   LOS: 2 days

## 2014-09-01 NOTE — Progress Notes (Signed)
Report of MRI  called to writer, MD on the unit, notified verbally.

## 2014-09-01 NOTE — Progress Notes (Signed)
Bilateral carotid artery duplex completed:  1-39% ICA stenosis.  Vertebral artery flow is antegrade.     

## 2014-09-02 LAB — GLUCOSE, CAPILLARY
GLUCOSE-CAPILLARY: 93 mg/dL (ref 70–99)
Glucose-Capillary: 91 mg/dL (ref 70–99)
Glucose-Capillary: 104 mg/dL — ABNORMAL HIGH (ref 70–99)
Glucose-Capillary: 128 mg/dL — ABNORMAL HIGH (ref 70–99)

## 2014-09-02 NOTE — Progress Notes (Signed)
STROKE TEAM PROGRESS NOTE   HISTORY Dawn Escobar is a 53 y.o. female with a history of hypercholesterolemia, posttraumatic stress disorder, Raynaud's disease and anxiety who experienced acute onset of right-sided weakness and numbness at 8:40 PM on 08/31/14.Marland Kitchen. She has no previous history of stroke nor TIA. She has not been on antiplatelet therapy. CT scan of her head showed no acute intracranial abnormality. Deficits improved after arriving in the emergency room, including return of her speech to normal as well as use of her right upper extremity. Deficits involving right lower extremity as well as sensory complaints were felt to likely be functional in etiology. Because her symptoms were improving and there were no clear objective deficits, TPA was not administered. NIH stroke score was 5.  LSN: 8:40 PM on 08/30/2014 tPA Given: No: Improving deficits and lack of clear objective remaining focal deficits   SUBJECTIVE (INTERVAL HISTORY): She is having some low back pain today and right leg pain. She can't get comfortable. Otherwise she is feeling better. She is upset because she can't be home with her fiancee and he has no way to viti her in the hospital because their finances are limited. Still with a mild lingering headache, nothing significant.     OBJECTIVE Temp:  [97.9 F (36.6 C)-98.4 F (36.9 C)] 98.1 F (36.7 C) (11/28 1004) Pulse Rate:  [52-95] 57 (11/28 1004) Cardiac Rhythm:  [-] Sinus bradycardia (11/28 1015) Resp:  [16-18] 16 (11/28 1004) BP: (107-137)/(50-64) 107/54 mmHg (11/28 1004) SpO2:  [98 %-100 %] 98 % (11/28 1004)   Recent Labs Lab 09/01/14 1108 09/01/14 1651 09/01/14 2157 09/02/14 0629 09/02/14 1151  GLUCAP 104* 103* 102* 93 91    Recent Labs Lab 08/30/14 2143 08/30/14 2156 08/31/14 0500  NA 140 141 144  K 3.9 3.9 3.8  CL 104 102 108  CO2 24  --  25  GLUCOSE 101* 103* 96  BUN 9 8 8   CREATININE 0.69 0.80 0.62  0.63  CALCIUM 8.8  --  8.7    Recent  Labs Lab 08/30/14 2143 08/31/14 0500  AST 14 12  ALT 8 7  ALKPHOS 71 65  BILITOT 0.2* 0.3  PROT 7.1 6.5  ALBUMIN 3.7 3.3*    Recent Labs Lab 08/30/14 2143 08/30/14 2156 08/31/14 0500  WBC 7.4  --  6.4  6.7  NEUTROABS 4.9  --  3.6  HGB 12.0 12.9 11.8*  11.6*  HCT 35.7* 38.0 35.0*  34.2*  MCV 94.7  --  94.3  92.9  PLT 158  --  162  164   No results for input(s): CKTOTAL, CKMB, CKMBINDEX, TROPONINI in the last 168 hours.  Recent Labs  08/30/14 2143  LABPROT 14.6  INR 1.12    Recent Labs  08/30/14 2237  COLORURINE YELLOW  LABSPEC 1.005  PHURINE 7.0  GLUCOSEU NEGATIVE  HGBUR MODERATE*  BILIRUBINUR NEGATIVE  KETONESUR NEGATIVE  PROTEINUR NEGATIVE  UROBILINOGEN 1.0  NITRITE NEGATIVE  LEUKOCYTESUR SMALL*       Component Value Date/Time   CHOL 103 08/31/2014 0500   TRIG 47 08/31/2014 0500   HDL 48 08/31/2014 0500   CHOLHDL 2.1 08/31/2014 0500   VLDL 9 08/31/2014 0500   LDLCALC 46 08/31/2014 0500   Lab Results  Component Value Date   HGBA1C 5.6 08/31/2014      Component Value Date/Time   LABOPIA NONE DETECTED 08/31/2014 0254   COCAINSCRNUR NONE DETECTED 08/31/2014 0254   LABBENZ NONE DETECTED 08/31/2014 0254   AMPHETMU NONE DETECTED  08/31/2014 0254   THCU NONE DETECTED 08/31/2014 0254   LABBARB NONE DETECTED 08/31/2014 0254    No results for input(s): ETH in the last 168 hours.   2-D echocardiogram 09/01/2014 Left ventricle: The cavity size was normal. Wall thickness was normal. Systolic function was normal. The estimated ejection fraction was in the range of 60% to 65%. Wall motion was normal; there were no regional wall motion abnormalities. Left ventricular diastolic function parameters were normal. - Aortic valve: Sclerosis without stenosis. There was mild central regurgitation. Regurgitation pressure half-time: 467 ms. - Mitral valve: Mildly thickened leaflets . There was mild regurgitation. - Left atrium: The atrium was  normal in size. - Right atrium: The atrium was normal in size. Central venous pressure (est): 8 mm Hg. - Atrial septum: No defect or patent foramen ovale was identified. - Tricuspid valve: There was mild regurgitation. - Pulmonary arteries: PA peak pressure: 33 mm Hg (S). - Inferior vena cava: The vessel was dilated. The respirophasic diameter changes were blunted (< 50%), consistent with elevated central venous pressure.  Impressions:  - LVEF 60-65%, normal wall thickness, normal wall motion, normal diastolic function and chamber sizes, mild TR, top normal RVSP with elevated RA pressure. Aortic valve sclerosis with mild AI and mitral valve leaflet thickening with mild MR.  Ct Head (brain) Wo Contrast 08/30/2014    Negative.     Dg Chest Port 1 View 08/31/2014    1. Mild bronchitic change.  2.  No evidence for acute cardiopulmonary abnormality.    MRI / MRA 09/01/2014 1. Left ACA territory infarcts in the high medial left frontal lobe without hemorrhage. 2. No focal stenosis or ACA occlusion. 3. No other significant white matter disease or other acute infarcts.     PHYSICAL EXAM NAD. Conversant. Awake alert. Afebrile. Head is nontraumatic. Neck is supple without bruit. Hearing is normal. Cardiac exam no murmur or gallop. Lungs are clear to auscultation. Distal pulses are well felt. Neurological Exam : Awake alert oriented x 3 normal speech and language. Visual fields full. Pupils equally round and reactive. Extraocular movements intact. Mild right lower face asymmetry. Tongue midline. No drift. Mild right-sided weakness 4/5 right arm and leg.   Mild right grip weak. No dysmetria appreciated. Some decreased right sensory changes. DTRs symmetric bilaterally. Toes downgoing.    ASSESSMENT/PLAN Dawn Escobar is a 53 y.o. female with history of hypercholesterolemia, posttraumatic stress disorder, Raynaud's disease, tobacco use, and anxiety, presenting with right  hemiparesis and hemisensory deficits. She did not receive IV t-PA due to resolving deficits.    Stroke:  Dominant. Head CT negative. MRI/MRA - see below. Probably embolic.  Resultant   Mild right leg weakness  MRI - Left ACA territory infarcts in the high medial left frontal lobe   MRA - no high-grade stenosis  Carotid Doppler - Bilateral 1-39% ICA stenosis. Vertebral artery flow is antegrade.   2D Echo - pending  LDL- 46  HgbA1c 5.6  Lovenox for VTE prophylaxis  Diet Heart with thin liquids  No antithrombotics prior to admission, now on aspirin 325 mg orally every day  Patient counseled to be compliant with her antithrombotic medications  Ongoing aggressive stroke risk factor management  Therapy recommendations:  Outpatient physical therapy recommended.  Disposition:  Pending  Hypertension  Home meds:   Cardura 12 mg daily  Stable  Hyperlipidemia  Home meds:  Crestor 20 mg daily resumed in hospital   LDL 46, goal < 70  Continue statin at discharge  Other Stroke Risk Factors  Cigarette smoker, advised to stop smoking  ETOH use   Other Active Problems  Mild anemia  Other Pertinent History  Tobacco use  TEE and Loop to be performed.  Hospital day # 3  Delton See [facility] Pager [phone] 09/02/2014, 1:34 PM  Personally examined patient and images, agree with history, physical, neuro exam as stated above. Agree with assessment and plan.   Naomie Dean, MD [facility]     To contact Stroke Continuity provider, please refer to [email or web address]. After hours, contact General Neurology

## 2014-09-02 NOTE — Progress Notes (Signed)
PATIENT DETAILS Name: Dawn Escobar Age: 53 y.o. Sex: female Date of Birth: 01-Nov-1960 Admit Date: 08/30/2014 Admitting Physician Eduard ClosArshad N Kakrakandy, MD PCP:No PCP Per Patient  Subjective: Headache  Assessment/Plan: Principal Problem: Acute CVA: Admitted with right upper/right lower extremity weakness and aphasia. Symptoms almost resolved, speech still not fluent but almost back to baseline. Very minimal right-sided weakness on exam. MRI brain confirms left ACA territory infarct. TTE negative for embolic source.Carotid Doppler negative for significant stenosis. LDL at goal at 46, continue statins. Hemoglobin A1c 5.6 . Currently on aspirin, continue. Recommendations from stroke team are to pursue a TEE/Loop recorder-have consulted cards.  Active Problems:   Hypercholesterolemia: As above. Continue statin    Tobacco abuse: Also extensively, advised to quit.    History of the raynaud's phenomena:resume Doxazosin-but at a lower dose today      History of GERD: Continue with PPI    History of anxiety: Continue with when necessary clonazepam.    Disposition: Remain inpatient-Home when work up complete  Antibiotics:  None   Anti-infectives    None      DVT Prophylaxis: Prophylactic Lovenox  Code Status: Full code   Family Communication None at bedside  Procedures:  None  CONSULTS:  neurology   MEDICATIONS: Scheduled Meds: . aspirin  325 mg Oral Daily  . doxazosin  2 mg Oral QHS  . enoxaparin (LOVENOX) injection  40 mg Subcutaneous Q24H  . feeding supplement (ENSURE COMPLETE)  237 mL Oral BID PC  . gabapentin  800 mg Oral TID  . pantoprazole  40 mg Oral Daily  . rosuvastatin  20 mg Oral q1800   Continuous Infusions:   PRN Meds:.acetaminophen, clonazePAM, cyclobenzaprine, traMADol, traZODone  PHYSICAL EXAM: Vital signs in last 24 hours: Filed Vitals:   09/02/14 0059 09/02/14 0627 09/02/14 1004 09/02/14 1350  BP: 121/61 118/50 107/54 113/56   Pulse: 55 52 57 63  Temp: 97.9 F (36.6 C) 97.9 F (36.6 C) 98.1 F (36.7 C) 98.8 F (37.1 C)  TempSrc: Oral Oral Oral Oral  Resp: 16 16 16 16   Height:      Weight:      SpO2: 99% 98% 98% 99%    Weight change:  Filed Weights   08/31/14 0100  Weight: 67.132 kg (148 lb)   Body mass index is 24.63 kg/(m^2).   Gen Exam: Awake and alert with clear speech-but at times non fluent.   Neck: Supple, No JVD.   Chest: B/L Clear.   CVS: S1 S2 Regular, no murmurs.  Abdomen: soft, BS +, non tender, non distended.  Extremities: no edema, lower extremities warm to touch. Neurologic:Very minimal RUE/RLE weakness  Skin: No Rash.   Wounds: N/A.    Intake/Output from previous day: No intake or output data in the 24 hours ending 09/02/14 1500   LAB RESULTS: CBC  Recent Labs Lab 08/30/14 2143 08/30/14 2156 08/31/14 0500  WBC 7.4  --  6.4  6.7  HGB 12.0 12.9 11.8*  11.6*  HCT 35.7* 38.0 35.0*  34.2*  PLT 158  --  162  164  MCV 94.7  --  94.3  92.9  MCH 31.8  --  31.8  31.5  MCHC 33.6  --  33.7  33.9  RDW 12.7  --  12.7  12.8  LYMPHSABS 2.0  --  2.0  MONOABS 0.4  --  0.5  EOSABS 0.1  --  0.2  BASOSABS 0.0  --  0.1  Chemistries   Recent Labs Lab 08/30/14 2143 08/30/14 2156 08/31/14 0500  NA 140 141 144  K 3.9 3.9 3.8  CL 104 102 108  CO2 24  --  25  GLUCOSE 101* 103* 96  BUN 9 8 8   CREATININE 0.69 0.80 0.62  0.63  CALCIUM 8.8  --  8.7    CBG:  Recent Labs Lab 09/01/14 1108 09/01/14 1651 09/01/14 2157 09/02/14 0629 09/02/14 1151  GLUCAP 104* 103* 102* 93 91    GFR Estimated Creatinine Clearance: 74 mL/min (by C-G formula based on Cr of 0.62).  Coagulation profile  Recent Labs Lab 08/30/14 2143  INR 1.12    Cardiac Enzymes No results for input(s): CKMB, TROPONINI, MYOGLOBIN in the last 168 hours.  Invalid input(s): CK  Invalid input(s): POCBNP No results for input(s): DDIMER in the last 72 hours.  Recent Labs  08/31/14 0500    HGBA1C 5.6    Recent Labs  08/31/14 0500  CHOL 103  HDL 48  LDLCALC 46  TRIG 47  CHOLHDL 2.1    Recent Labs  08/31/14 0500  TSH 2.190   No results for input(s): VITAMINB12, FOLATE, FERRITIN, TIBC, IRON, RETICCTPCT in the last 72 hours. No results for input(s): LIPASE, AMYLASE in the last 72 hours.  Urine Studies No results for input(s): UHGB, CRYS in the last 72 hours.  Invalid input(s): UACOL, UAPR, USPG, UPH, UTP, UGL, UKET, UBIL, UNIT, UROB, ULEU, UEPI, UWBC, URBC, UBAC, CAST, UCOM, BILUA  MICROBIOLOGY: No results found for this or any previous visit (from the past 240 hour(s)).  RADIOLOGY STUDIES/RESULTS: Ct Head (brain) Wo Contrast  08/30/2014   CLINICAL DATA:  Right-sided weakness.  EXAM: CT HEAD WITHOUT CONTRAST  TECHNIQUE: Contiguous axial images were obtained from the base of the skull through the vertex without intravenous contrast.  COMPARISON:  None.  FINDINGS: No acute intracranial abnormality. Specifically, no hemorrhage, hydrocephalus, mass lesion, acute infarction, or significant intracranial injury. No acute calvarial abnormality. Visualized paranasal sinuses and mastoids clear. Orbital soft tissues unremarkable.  IMPRESSION: Negative.  Critical Value/emergent results were called by telephone at the time of interpretation on 08/30/2014 at 9:57 pm to Dr. Roseanne RenoStewart , who verbally acknowledged these results.   Electronically Signed   By: Charlett NoseKevin  Dover M.D.   On: 08/30/2014 21:57   Mri Brain Without Contrast  09/01/2014   CLINICAL DATA:  Right upper and lower extremity weakness and aphasia. The extremity symptoms have near completely resolved. The patient continues to have some speech difficulties.  EXAM: MRI HEAD WITHOUT CONTRAST  MRA HEAD WITHOUT CONTRAST  TECHNIQUE: Multiplanar, multiecho pulse sequences of the brain and surrounding structures were obtained without intravenous contrast. Angiographic images of the head were obtained using MRA technique without  contrast.  COMPARISON:  CT head without contrast 08/30/2014.  FINDINGS: MRI HEAD FINDINGS  Scattered foci of restricted diffusion are present within the cortical and subcortical regions of the medial right frontal lobe, following the left ACA distribution. T2 changes are associated with the infarct, cysts compatible with a subacute time frame. No other acute infarcts are present. There is no significant white matter disease. The ventricles are of normal size. No significant extraaxial fluid collection is present.  Flow is present in the major intracranial arteries. The patient is status post bilateral lens replacements. Midline structures are normal. The globes and orbits are otherwise intact. Mild mucosal thickening is present within the ethmoid air cells and sphenoid sinuses bilaterally.  MRA HEAD FINDINGS  The internal carotid  arteries are within normal limits from the high cervical segments through the ICA termini bilaterally. The A1 and M1 segments are normal. Anterior communicating artery is patent. The MCA bifurcations are intact bilaterally. The ACA and MCA branch vessels are within normal limits.  The left vertebral artery is the dominant vessel. The right vertebral artery essentially terminates at the PICA sending only a very small branch to the vertebrobasilar junction. The basilar artery is within normal limits. A prominent left posterior communicating artery is present. A smaller right posterior communicating artery is present. P1 segments are evident bilaterally. The PCA branch vessels are within normal limits.  IMPRESSION: 1. Left ACA territory infarcts in the high medial left frontal lobe without hemorrhage. 2. No focal stenosis or ACA occlusion. 3. No other significant white matter disease or other acute infarcts. These results will be called to the ordering clinician or representative by the Radiologist Assistant, and communication documented in the PACS or zVision Dashboard.   Electronically  Signed   By: Gennette Pac M.D.   On: 09/01/2014 10:18   Dg Chest Port 1 View  08/31/2014   CLINICAL DATA:  Tobacco abuse. History of Raynaud's disease, heart murmur, hypercholesterolemia.  EXAM: PORTABLE CHEST - 1 VIEW  COMPARISON:  None.  FINDINGS: Heart size is upper limits normal. Mild perihilar bronchitic changes are identified. Lungs are otherwise clear. No pulmonary edema. Visualized osseous structures have a normal appearance.  IMPRESSION: 1. Mild bronchitic change. 2.  No evidence for acute cardiopulmonary abnormality.   Electronically Signed   By: Rosalie Gums M.D.   On: 08/31/2014 09:47   Mr Maxine Glenn Head/brain Wo Cm  09/01/2014   CLINICAL DATA:  Right upper and lower extremity weakness and aphasia. The extremity symptoms have near completely resolved. The patient continues to have some speech difficulties.  EXAM: MRI HEAD WITHOUT CONTRAST  MRA HEAD WITHOUT CONTRAST  TECHNIQUE: Multiplanar, multiecho pulse sequences of the brain and surrounding structures were obtained without intravenous contrast. Angiographic images of the head were obtained using MRA technique without contrast.  COMPARISON:  CT head without contrast 08/30/2014.  FINDINGS: MRI HEAD FINDINGS  Scattered foci of restricted diffusion are present within the cortical and subcortical regions of the medial right frontal lobe, following the left ACA distribution. T2 changes are associated with the infarct, cysts compatible with a subacute time frame. No other acute infarcts are present. There is no significant white matter disease. The ventricles are of normal size. No significant extraaxial fluid collection is present.  Flow is present in the major intracranial arteries. The patient is status post bilateral lens replacements. Midline structures are normal. The globes and orbits are otherwise intact. Mild mucosal thickening is present within the ethmoid air cells and sphenoid sinuses bilaterally.  MRA HEAD FINDINGS  The internal carotid  arteries are within normal limits from the high cervical segments through the ICA termini bilaterally. The A1 and M1 segments are normal. Anterior communicating artery is patent. The MCA bifurcations are intact bilaterally. The ACA and MCA branch vessels are within normal limits.  The left vertebral artery is the dominant vessel. The right vertebral artery essentially terminates at the PICA sending only a very small branch to the vertebrobasilar junction. The basilar artery is within normal limits. A prominent left posterior communicating artery is present. A smaller right posterior communicating artery is present. P1 segments are evident bilaterally. The PCA branch vessels are within normal limits.  IMPRESSION: 1. Left ACA territory infarcts in the high medial left frontal  lobe without hemorrhage. 2. No focal stenosis or ACA occlusion. 3. No other significant white matter disease or other acute infarcts. These results will be called to the ordering clinician or representative by the Radiologist Assistant, and communication documented in the PACS or zVision Dashboard.   Electronically Signed   By: Gennette Pac M.D.   On: 09/01/2014 10:18    Jeoffrey Massed, MD  [facility] Pager:[phone]  If 7PM-7AM, please contact night-coverage [email or web address] Password [email or web address] 09/02/2014, 3:00 PM   LOS: 3 days
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
## 2014-09-02 NOTE — Plan of Care (Signed)
Problem: Progression Outcomes Goal: Progressive activity as tolerated Outcome: Completed/Met Date Met:  09/02/14 Goal: Pain controlled Outcome: Progressing Goal: Bowel & Bladder Continence Outcome: Completed/Met Date Met:  09/02/14 Goal: Educational plan initiated Outcome: Progressing

## 2014-09-03 DIAGNOSIS — I639 Cerebral infarction, unspecified: Secondary | ICD-10-CM

## 2014-09-03 DIAGNOSIS — I6340 Cerebral infarction due to embolism of unspecified cerebral artery: Secondary | ICD-10-CM | POA: Insufficient documentation

## 2014-09-03 DIAGNOSIS — I10 Essential (primary) hypertension: Secondary | ICD-10-CM

## 2014-09-03 LAB — URINALYSIS, ROUTINE W REFLEX MICROSCOPIC
Bilirubin Urine: NEGATIVE
Glucose, UA: NEGATIVE mg/dL
Ketones, ur: NEGATIVE mg/dL
Leukocytes, UA: NEGATIVE
Nitrite: NEGATIVE
Protein, ur: NEGATIVE mg/dL
SPECIFIC GRAVITY, URINE: 1.005 (ref 1.005–1.030)
Urobilinogen, UA: 0.2 mg/dL (ref 0.0–1.0)
pH: 6 (ref 5.0–8.0)

## 2014-09-03 LAB — GLUCOSE, CAPILLARY
GLUCOSE-CAPILLARY: 91 mg/dL (ref 70–99)
GLUCOSE-CAPILLARY: 117 mg/dL — AB (ref 70–99)
Glucose-Capillary: 102 mg/dL — ABNORMAL HIGH (ref 70–99)
Glucose-Capillary: 126 mg/dL — ABNORMAL HIGH (ref 70–99)

## 2014-09-03 LAB — URINE MICROSCOPIC-ADD ON

## 2014-09-03 MED ORDER — HYDROCORTISONE 1 % EX CREA
TOPICAL_CREAM | Freq: Three times a day (TID) | CUTANEOUS | Status: DC
  Administered 2014-09-03: 1 via TOPICAL
  Administered 2014-09-03 – 2014-09-06 (×8): via TOPICAL
  Filled 2014-09-03 (×3): qty 28
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
## 2014-09-03 NOTE — Progress Notes (Signed)
PATIENT DETAILS Name: Dawn Escobar Age: 53 y.o. Sex: female Date of Birth: June 16, 1961 Admit Date: 08/30/2014 Admitting Physician Eduard Clos, MD PCP:No PCP Per Patient  Subjective: Headache  Assessment/Plan: Principal Problem: Acute CVA:  -Admitted with right upper/right lower extremity weakness and aphasia. Symptoms almost resolved, speech still not fluent but almost back to baseline.   -MRI brain confirms left ACA territory infarct. TTE negative for embolic source.Carotid Doppler negative for significant stenosis.  -Plan for TEE and loop recorder placement on Monday 09/04/2014 -Continue ASA 325 mg PO q daily -NPO after midnight -SW consult for dispo. Patient states recently moving from [place]. Does not have a home or family in the area.   Active Problems:   Hypercholesterolemia:  -Continue crestor    Tobacco abuse: Also extensively, advised to quit.    History of the raynaud's phenomena:resume Doxazosin-but at a lower dose today      History of GERD: Continue with PPI    History of anxiety: Continue with when necessary clonazepam.    Disposition: Remain inpatient-Home when work up complete  Antibiotics:  None   Anti-infectives    None      DVT Prophylaxis: Prophylactic Lovenox  Code Status: Full code   Family Communication None at bedside  Procedures:  None  CONSULTS:  neurology   MEDICATIONS: Scheduled Meds: . aspirin  325 mg Oral Daily  . doxazosin  2 mg Oral QHS  . enoxaparin (LOVENOX) injection  40 mg Subcutaneous Q24H  . feeding supplement (ENSURE COMPLETE)  237 mL Oral BID PC  . gabapentin  800 mg Oral TID  . pantoprazole  40 mg Oral Daily  . rosuvastatin  20 mg Oral q1800   Continuous Infusions:   PRN Meds:.acetaminophen, clonazePAM, cyclobenzaprine, traMADol, traZODone  PHYSICAL EXAM: Vital signs in last 24 hours: Filed Vitals:   09/02/14 2201 09/03/14 0202 09/03/14 0607 09/03/14 1039  BP: 134/55 121/61  106/44 101/36  Pulse: 63 65 60 60  Temp: 99.3 F (37.4 C) 98.9 F (37.2 C)  98.1 F (36.7 C)  TempSrc: Oral Oral  Oral  Resp: 18 16 16 17   Height:      Weight:      SpO2: 98% 98% 99% 97%    Weight change:  Filed Weights   08/31/14 0100  Weight: 67.132 kg (148 lb)   Body mass index is 24.63 kg/(m^2).   Gen Exam: Awake and alert with clear speech-but at times non fluent.   Neck: Supple, No JVD.   Chest: B/L Clear.   CVS: S1 S2 Regular, no murmurs.  Abdomen: soft, BS +, non tender, non distended.  Extremities: no edema, lower extremities warm to touch. Neurologic:Very minimal RUE/RLE weakness  Skin: No Rash.   Wounds: N/A.    Intake/Output from previous day: No intake or output data in the 24 hours ending 09/03/14 1204   LAB RESULTS: CBC  Recent Labs Lab 08/30/14 2143 08/30/14 2156 08/31/14 0500  WBC 7.4  --  6.4  6.7  HGB 12.0 12.9 11.8*  11.6*  HCT 35.7* 38.0 35.0*  34.2*  PLT 158  --  162  164  MCV 94.7  --  94.3  92.9  MCH 31.8  --  31.8  31.5  MCHC 33.6  --  33.7  33.9  RDW 12.7  --  12.7  12.8  LYMPHSABS 2.0  --  2.0  MONOABS 0.4  --  0.5  EOSABS 0.1  --  0.2  BASOSABS 0.0  --  0.1    Chemistries   Recent Labs Lab 08/30/14 2143 08/30/14 2156 08/31/14 0500  NA 140 141 144  K 3.9 3.9 3.8  CL 104 102 108  CO2 24  --  25  GLUCOSE 101* 103* 96  BUN 9 8 8   CREATININE 0.69 0.80 0.62  0.63  CALCIUM 8.8  --  8.7    CBG:  Recent Labs Lab 09/02/14 1151 09/02/14 1631 09/02/14 2202 09/03/14 0608 09/03/14 1130  GLUCAP 91 104* 128* 102* 91    GFR Estimated Creatinine Clearance: 74 mL/min (by C-G formula based on Cr of 0.62).  Coagulation profile  Recent Labs Lab 08/30/14 2143  INR 1.12    Cardiac Enzymes No results for input(s): CKMB, TROPONINI, MYOGLOBIN in the last 168 hours.  Invalid input(s): CK  Invalid input(s): POCBNP No results for input(s): DDIMER in the last 72 hours. No results for input(s): HGBA1C in the  last 72 hours. No results for input(s): CHOL, HDL, LDLCALC, TRIG, CHOLHDL, LDLDIRECT in the last 72 hours. No results for input(s): TSH, T4TOTAL, T3FREE, THYROIDAB in the last 72 hours.  Invalid input(s): FREET3 No results for input(s): VITAMINB12, FOLATE, FERRITIN, TIBC, IRON, RETICCTPCT in the last 72 hours. No results for input(s): LIPASE, AMYLASE in the last 72 hours.  Urine Studies No results for input(s): UHGB, CRYS in the last 72 hours.  Invalid input(s): UACOL, UAPR, USPG, UPH, UTP, UGL, UKET, UBIL, UNIT, UROB, ULEU, UEPI, UWBC, URBC, UBAC, CAST, UCOM, BILUA  MICROBIOLOGY: No results found for this or any previous visit (from the past 240 hour(s)).  RADIOLOGY STUDIES/RESULTS: Ct Head (brain) Wo Contrast  08/30/2014   CLINICAL DATA:  Right-sided weakness.  EXAM: CT HEAD WITHOUT CONTRAST  TECHNIQUE: Contiguous axial images were obtained from the base of the skull through the vertex without intravenous contrast.  COMPARISON:  None.  FINDINGS: No acute intracranial abnormality. Specifically, no hemorrhage, hydrocephalus, mass lesion, acute infarction, or significant intracranial injury. No acute calvarial abnormality. Visualized paranasal sinuses and mastoids clear. Orbital soft tissues unremarkable.  IMPRESSION: Negative.  Critical Value/emergent results were called by telephone at the time of interpretation on 08/30/2014 at 9:57 pm to Dr. Roseanne Reno , who verbally acknowledged these results.   Electronically Signed   By: Charlett Nose M.D.   On: 08/30/2014 21:57   Mri Brain Without Contrast  09/01/2014   CLINICAL DATA:  Right upper and lower extremity weakness and aphasia. The extremity symptoms have near completely resolved. The patient continues to have some speech difficulties.  EXAM: MRI HEAD WITHOUT CONTRAST  MRA HEAD WITHOUT CONTRAST  TECHNIQUE: Multiplanar, multiecho pulse sequences of the brain and surrounding structures were obtained without intravenous contrast. Angiographic images  of the head were obtained using MRA technique without contrast.  COMPARISON:  CT head without contrast 08/30/2014.  FINDINGS: MRI HEAD FINDINGS  Scattered foci of restricted diffusion are present within the cortical and subcortical regions of the medial right frontal lobe, following the left ACA distribution. T2 changes are associated with the infarct, cysts compatible with a subacute time frame. No other acute infarcts are present. There is no significant white matter disease. The ventricles are of normal size. No significant extraaxial fluid collection is present.  Flow is present in the major intracranial arteries. The patient is status post bilateral lens replacements. Midline structures are normal. The globes and orbits are otherwise intact. Mild mucosal thickening is present within the ethmoid air cells and sphenoid sinuses bilaterally.  MRA HEAD  FINDINGS  The internal carotid arteries are within normal limits from the high cervical segments through the ICA termini bilaterally. The A1 and M1 segments are normal. Anterior communicating artery is patent. The MCA bifurcations are intact bilaterally. The ACA and MCA branch vessels are within normal limits.  The left vertebral artery is the dominant vessel. The right vertebral artery essentially terminates at the PICA sending only a very small branch to the vertebrobasilar junction. The basilar artery is within normal limits. A prominent left posterior communicating artery is present. A smaller right posterior communicating artery is present. P1 segments are evident bilaterally. The PCA branch vessels are within normal limits.  IMPRESSION: 1. Left ACA territory infarcts in the high medial left frontal lobe without hemorrhage. 2. No focal stenosis or ACA occlusion. 3. No other significant white matter disease or other acute infarcts. These results will be called to the ordering clinician or representative by the Radiologist Assistant, and communication documented in  the PACS or zVision Dashboard.   Electronically Signed   By: Gennette Pachris  Mattern M.D.   On: 09/01/2014 10:18   Dg Chest Port 1 View  08/31/2014   CLINICAL DATA:  Tobacco abuse. History of Raynaud's disease, heart murmur, hypercholesterolemia.  EXAM: PORTABLE CHEST - 1 VIEW  COMPARISON:  None.  FINDINGS: Heart size is upper limits normal. Mild perihilar bronchitic changes are identified. Lungs are otherwise clear. No pulmonary edema. Visualized osseous structures have a normal appearance.  IMPRESSION: 1. Mild bronchitic change. 2.  No evidence for acute cardiopulmonary abnormality.   Electronically Signed   By: Rosalie GumsBeth  Brown M.D.   On: 08/31/2014 09:47   Mr Maxine GlennMra Head/brain Wo Cm  09/01/2014   CLINICAL DATA:  Right upper and lower extremity weakness and aphasia. The extremity symptoms have near completely resolved. The patient continues to have some speech difficulties.  EXAM: MRI HEAD WITHOUT CONTRAST  MRA HEAD WITHOUT CONTRAST  TECHNIQUE: Multiplanar, multiecho pulse sequences of the brain and surrounding structures were obtained without intravenous contrast. Angiographic images of the head were obtained using MRA technique without contrast.  COMPARISON:  CT head without contrast 08/30/2014.  FINDINGS: MRI HEAD FINDINGS  Scattered foci of restricted diffusion are present within the cortical and subcortical regions of the medial right frontal lobe, following the left ACA distribution. T2 changes are associated with the infarct, cysts compatible with a subacute time frame. No other acute infarcts are present. There is no significant white matter disease. The ventricles are of normal size. No significant extraaxial fluid collection is present.  Flow is present in the major intracranial arteries. The patient is status post bilateral lens replacements. Midline structures are normal. The globes and orbits are otherwise intact. Mild mucosal thickening is present within the ethmoid air cells and sphenoid sinuses  bilaterally.  MRA HEAD FINDINGS  The internal carotid arteries are within normal limits from the high cervical segments through the ICA termini bilaterally. The A1 and M1 segments are normal. Anterior communicating artery is patent. The MCA bifurcations are intact bilaterally. The ACA and MCA branch vessels are within normal limits.  The left vertebral artery is the dominant vessel. The right vertebral artery essentially terminates at the PICA sending only a very small branch to the vertebrobasilar junction. The basilar artery is within normal limits. A prominent left posterior communicating artery is present. A smaller right posterior communicating artery is present. P1 segments are evident bilaterally. The PCA branch vessels are within normal limits.  IMPRESSION: 1. Left ACA territory infarcts in  the high medial left frontal lobe without hemorrhage. 2. No focal stenosis or ACA occlusion. 3. No other significant white matter disease or other acute infarcts. These results will be called to the ordering clinician or representative by the Radiologist Assistant, and communication documented in the PACS or zVision Dashboard.   Electronically Signed   By: Gennette Pachris  Mattern M.D.   On: 09/01/2014 10:18    Jeralyn BennettZAMORA, Lorilee Cafarella, MD  [facility] Pager:[phone]  If 7PM-7AM, please contact night-coverage [email or web address] Password [email or web address] 09/03/2014, 12:04 PM   LOS: 4 days

## 2014-09-03 NOTE — Progress Notes (Signed)
STROKE TEAM PROGRESS NOTE   HISTORY Dawn Escobar is a 53 y.o. female with a history of hypercholesterolemia, posttraumatic stress disorder, Raynaud's disease and anxiety who experienced acute onset of right-sided weakness and numbness at 8:40 PM on 08/31/14.Marland Kitchen. She has no previous history of stroke nor TIA. She has not been on antiplatelet therapy. CT scan of her head showed no acute intracranial abnormality. Deficits improved after arriving in the emergency room, including return of her speech to normal as well as use of her right upper extremity. Deficits involving right lower extremity as well as sensory complaints were felt to likely be functional in etiology. Because her symptoms were improving and there were no clear objective deficits, TPA was not administered. NIH stroke score was 5.  LSN: 8:40 PM on 08/30/2014 tPA Given: No: Improving deficits and lack of clear objective remaining focal deficits   SUBJECTIVE (INTERVAL HISTORY): She still has slight headache and back pain. Otherwise no changes overnight.     OBJECTIVE Temp:  [98.1 F (36.7 C)-99.3 F (37.4 C)] 99 F (37.2 C) (11/29 1352) Pulse Rate:  [51-65] 51 (11/29 1352) Cardiac Rhythm:  [-] Normal sinus rhythm (11/28 2000) Resp:  [16-18] 17 (11/29 1352) BP: (101-134)/(36-77) 102/41 mmHg (11/29 1352) SpO2:  [97 %-100 %] 98 % (11/29 1352)   Recent Labs Lab 09/02/14 1151 09/02/14 1631 09/02/14 2202 09/03/14 0608 09/03/14 1130  GLUCAP 91 104* 128* 102* 91    Recent Labs Lab 08/30/14 2143 08/30/14 2156 08/31/14 0500  NA 140 141 144  K 3.9 3.9 3.8  CL 104 102 108  CO2 24  --  25  GLUCOSE 101* 103* 96  BUN 9 8 8   CREATININE 0.69 0.80 0.62  0.63  CALCIUM 8.8  --  8.7    Recent Labs Lab 08/30/14 2143 08/31/14 0500  AST 14 12  ALT 8 7  ALKPHOS 71 65  BILITOT 0.2* 0.3  PROT 7.1 6.5  ALBUMIN 3.7 3.3*    Recent Labs Lab 08/30/14 2143 08/30/14 2156 08/31/14 0500  WBC 7.4  --  6.4  6.7  NEUTROABS  4.9  --  3.6  HGB 12.0 12.9 11.8*  11.6*  HCT 35.7* 38.0 35.0*  34.2*  MCV 94.7  --  94.3  92.9  PLT 158  --  162  164   No results for input(s): CKTOTAL, CKMB, CKMBINDEX, TROPONINI in the last 168 hours. No results for input(s): LABPROT, INR in the last 72 hours. No results for input(s): COLORURINE, LABSPEC, PHURINE, GLUCOSEU, HGBUR, BILIRUBINUR, KETONESUR, PROTEINUR, UROBILINOGEN, NITRITE, LEUKOCYTESUR in the last 72 hours.  Invalid input(s): APPERANCEUR     Component Value Date/Time   CHOL 103 08/31/2014 0500   TRIG 47 08/31/2014 0500   HDL 48 08/31/2014 0500   CHOLHDL 2.1 08/31/2014 0500   VLDL 9 08/31/2014 0500   LDLCALC 46 08/31/2014 0500   Lab Results  Component Value Date   HGBA1C 5.6 08/31/2014      Component Value Date/Time   LABOPIA NONE DETECTED 08/31/2014 0254   COCAINSCRNUR NONE DETECTED 08/31/2014 0254   LABBENZ NONE DETECTED 08/31/2014 0254   AMPHETMU NONE DETECTED 08/31/2014 0254   THCU NONE DETECTED 08/31/2014 0254   LABBARB NONE DETECTED 08/31/2014 0254    No results for input(s): ETH in the last 168 hours.   2-D echocardiogram 09/01/2014 Left ventricle: The cavity size was normal. Wall thickness was normal. Systolic function was normal. The estimated ejection fraction was in the range of 60% to 65%. Wall motion  was normal; there were no regional wall motion abnormalities. Left ventricular diastolic function parameters were normal. - Aortic valve: Sclerosis without stenosis. There was mild central regurgitation. Regurgitation pressure half-time: 467 ms. - Mitral valve: Mildly thickened leaflets . There was mild regurgitation. - Left atrium: The atrium was normal in size. - Right atrium: The atrium was normal in size. Central venous pressure (est): 8 mm Hg. - Atrial septum: No defect or patent foramen ovale was identified. - Tricuspid valve: There was mild regurgitation. - Pulmonary arteries: PA peak pressure: 33 mm Hg (S). -  Inferior vena cava: The vessel was dilated. The respirophasic diameter changes were blunted (< 50%), consistent with elevated central venous pressure.  Impressions:  - LVEF 60-65%, normal wall thickness, normal wall motion, normal diastolic function and chamber sizes, mild TR, top normal RVSP with elevated RA pressure. Aortic valve sclerosis with mild AI and mitral valve leaflet thickening with mild MR.  Ct Head (brain) Wo Contrast 08/30/2014    Negative.     Dg Chest Port 1 View 08/31/2014    1. Mild bronchitic change.  2.  No evidence for acute cardiopulmonary abnormality.    MRI / MRA 09/01/2014 1. Left ACA territory infarcts in the high medial left frontal lobe without hemorrhage. 2. No focal stenosis or ACA occlusion. 3. No other significant white matter disease or other acute infarcts.     PHYSICAL EXAM NAD. Conversant. Awake alert. Afebrile. Head is nontraumatic. Neck is supple without bruit. Hearing is normal. Cardiac exam no murmur or gallop. Lungs are clear to auscultation. Distal pulses are well felt. Neurological Exam : Awake alert oriented x 3 normal speech and language. Visual fields full. Pupils equally round and reactive. Extraocular movements intact. Mild right lower face asymmetry. Tongue midline. No drift. Mild right-sided weakness 4/5 right arm and leg.   Mild right grip weak. No dysmetria appreciated. Some decreased right sensory changes. DTRs symmetric bilaterally. Toes downgoing.    ASSESSMENT/PLAN [name] is a 53 y.o. female with history of hypercholesterolemia, posttraumatic stress disorder, Raynaud's disease, tobacco use, and anxiety, presenting with right hemiparesis and hemisensory deficits. She did not receive IV t-PA due to resolving deficits.    Stroke:  Dominant. Head CT negative. MRI/MRA - see below. Probably embolic.  Resultant   Mild right leg weakness  MRI - Left ACA territory infarcts in the high medial left frontal  lobe   MRA - no high-grade stenosis  Carotid Doppler - Bilateral 1-39% ICA stenosis. Vertebral artery flow is antegrade.   2D Echo - pending  LDL- 46  HgbA1c 5.6  Lovenox for VTE prophylaxis Diet Heart  Diet NPO time specified with thin liquids  No antithrombotics prior to admission, now on aspirin 325 mg orally every day  Patient counseled to be compliant with her antithrombotic medications  Ongoing aggressive stroke risk factor management  Therapy recommendations:  Outpatient physical therapy recommended.  Disposition:  Pending  Hypertension  Home meds:   Cardura 12 mg daily  Stable  Hyperlipidemia  Home meds:  Crestor 20 mg daily resumed in hospital   LDL 46, goal < 70  Continue statin at discharge   Other Stroke Risk Factors  Cigarette smoker, advised to stop smoking  ETOH use   Other Active Problems  Mild anemia  Other Pertinent History  Tobacco use  TEE and Loop to be performed Monday.  Hospital day # 4  Delton SeeDavid Rinehuls [facility] Pager [phone] 09/03/2014, 2:42 PM  Personally  examined patient and images, agree with history, physical, neuro exam as stated above. Agree with assessment and plan.   Naomie Dean, MD [facility]     To contact Stroke Continuity provider, please refer to [email or web address]. After hours, contact General Neurology

## 2014-09-04 DIAGNOSIS — R21 Rash and other nonspecific skin eruption: Secondary | ICD-10-CM | POA: Insufficient documentation

## 2014-09-04 DIAGNOSIS — I6340 Cerebral infarction due to embolism of unspecified cerebral artery: Secondary | ICD-10-CM

## 2014-09-04 LAB — GLUCOSE, CAPILLARY
GLUCOSE-CAPILLARY: 106 mg/dL — AB (ref 70–99)
GLUCOSE-CAPILLARY: 102 mg/dL — AB (ref 70–99)
Glucose-Capillary: 133 mg/dL — ABNORMAL HIGH (ref 70–99)

## 2014-09-04 MED ORDER — PREDNISONE 20 MG PO TABS
60.0000 mg | ORAL_TABLET | Freq: Every day | ORAL | Status: DC
  Administered 2014-09-04: 60 mg via ORAL
  Filled 2014-09-04: qty 3

## 2014-09-04 NOTE — Progress Notes (Signed)
Patient had small amount of nose bleed from her left nostril. Will continue to monitor

## 2014-09-04 NOTE — Progress Notes (Signed)
Received call from Cardiology. TEE scheduled for tomorrow due to scheduling issues. Pt to be NPO after midnight. Rash persists to back and legs , applied Hydrocortisone. MD aware.
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
## 2014-09-04 NOTE — Plan of Care (Signed)
Problem: Consults Goal: Skin Care Protocol Initiated - if Braden Score 18 or less If consults are not indicated, leave blank or document N/A Outcome: Progressing     

## 2014-09-04 NOTE — Progress Notes (Signed)
PATIENT DETAILS Name: Dawn Escobar Age: 53 y.o. Sex: female Date of Birth: [birth year] Admit Date: 08/30/2014 Admitting Physician Eduard Clos, MD PCP:No PCP Per Patient  Subjective: Patient states feeling better although reports ongoing pruritic rash involving back and posterior aspect of bilateral lower extremities  Assessment/Plan: Principal Problem: Acute CVA:  -Admitted with right upper/right lower extremity weakness and aphasia. Symptoms almost resolved, speech still not fluent but almost back to baseline.   -MRI brain confirms left ACA territory infarct. TTE negative for embolic source.Carotid Doppler negative for significant stenosis.  -Continue ASA 325 mg PO q daily -NPO after midnight -GEN loop recorder placement had been originally planned for today however will need to be postponed until tomorrow due to scheduling issues  Active Problems:  Pruritic rash -Patient developing a pruritic erythematous rash involving back, [place] region, posterior aspect of thighs over the last 24-48 hours. -Given location of rash I wonder about the possibility of contact dermatitis -Nursing order for hypoallergenic sheets placed -Will start prednisone 60 mg by mouth daily    Hypercholesterolemia:  -Continue crestor    Tobacco abuse: Also extensively, advised to quit.    History of the raynaud's phenomena:resume Doxazosin-but at a lower dose today      History of GERD: Continue with PPI    History of anxiety: Continue with when necessary clonazepam.    Disposition: Patient will likely be discharged to hotel  in the next 24 hours  Antibiotics:  None   Anti-infectives    None      DVT Prophylaxis: Prophylactic Lovenox  Code Status: Full code   Family Communication None at bedside  Procedures:  None  CONSULTS:  neurology   MEDICATIONS: Scheduled Meds: . aspirin  325 mg Oral Daily  . doxazosin  2 mg Oral QHS  . enoxaparin (LOVENOX)  injection  40 mg Subcutaneous Q24H  . feeding supplement (ENSURE COMPLETE)  237 mL Oral BID PC  . gabapentin  800 mg Oral TID  . hydrocortisone cream   Topical TID  . pantoprazole  40 mg Oral Daily  . predniSONE  60 mg Oral Q breakfast  . rosuvastatin  20 mg Oral q1800   Continuous Infusions:   PRN Meds:.acetaminophen, clonazePAM, cyclobenzaprine, traMADol, traZODone  PHYSICAL EXAM: Vital signs in last 24 hours: Filed Vitals:   09/04/14 0517 09/04/14 0854 09/04/14 1423 09/04/14 1647  BP: 111/51 111/48 119/59 130/49  Pulse: 63 57 59 60  Temp: 98.1 F (36.7 C) 97.6 F (36.4 C) 97.4 F (36.3 C) 98.1 F (36.7 C)  TempSrc: Oral Oral Oral Oral  Resp: 18 20 20 20   Height:      Weight:      SpO2: 99% 99% 98% 99%    Weight change:  Filed Weights   08/31/14 0100  Weight: 67.132 kg (148 lb)   Body mass index is 24.63 kg/(m^2).   Gen Exam: Awake and alert with clear speech-but at times non fluent.   Neck: Supple, No JVD.   Chest: B/L Clear.   CVS: S1 S2 Regular, no murmurs.  Abdomen: soft, BS +, non tender, non distended.  Extremities: no edema, lower extremities warm to touch. Neurologic:Very minimal RUE/RLE weakness  Skin: Patient having erythema involving back, but attack region, posterior aspect of thighs. Erythema ill-defined Wounds: N/A.    Intake/Output from previous day: No intake or output data in the 24 hours ending 09/04/14 1718   LAB RESULTS: CBC  Recent Labs Lab 08/30/14  2143 08/30/14 2156 08/31/14 0500  WBC 7.4  --  6.4  6.7  HGB 12.0 12.9 11.8*  11.6*  HCT 35.7* 38.0 35.0*  34.2*  PLT 158  --  162  164  MCV 94.7  --  94.3  92.9  MCH 31.8  --  31.8  31.5  MCHC 33.6  --  33.7  33.9  RDW 12.7  --  12.7  12.8  LYMPHSABS 2.0  --  2.0  MONOABS 0.4  --  0.5  EOSABS 0.1  --  0.2  BASOSABS 0.0  --  0.1    Chemistries   Recent Labs Lab 08/30/14 2143 08/30/14 2156 08/31/14 0500  NA 140 141 144  K 3.9 3.9 3.8  CL 104 102 108  CO2 24   --  25  GLUCOSE 101* 103* 96  BUN 9 8 8   CREATININE 0.69 0.80 0.62  0.63  CALCIUM 8.8  --  8.7    CBG:  Recent Labs Lab 09/03/14 1634 09/03/14 2142 09/04/14 0619 09/04/14 1133 09/04/14 1643  GLUCAP 117* 126* 106* 102* 133*    GFR Estimated Creatinine Clearance: 74 mL/min (by C-G formula based on Cr of 0.62).  Coagulation profile  Recent Labs Lab 08/30/14 2143  INR 1.12    Cardiac Enzymes No results for input(s): CKMB, TROPONINI, MYOGLOBIN in the last 168 hours.  Invalid input(s): CK  Invalid input(s): POCBNP No results for input(s): DDIMER in the last 72 hours. No results for input(s): HGBA1C in the last 72 hours. No results for input(s): CHOL, HDL, LDLCALC, TRIG, CHOLHDL, LDLDIRECT in the last 72 hours. No results for input(s): TSH, T4TOTAL, T3FREE, THYROIDAB in the last 72 hours.  Invalid input(s): FREET3 No results for input(s): VITAMINB12, FOLATE, FERRITIN, TIBC, IRON, RETICCTPCT in the last 72 hours. No results for input(s): LIPASE, AMYLASE in the last 72 hours.  Urine Studies No results for input(s): UHGB, CRYS in the last 72 hours.  Invalid input(s): UACOL, UAPR, USPG, UPH, UTP, UGL, UKET, UBIL, UNIT, UROB, ULEU, UEPI, UWBC, URBC, UBAC, CAST, UCOM, BILUA  MICROBIOLOGY: No results found for this or any previous visit (from the past 240 hour(s)).  RADIOLOGY STUDIES/RESULTS: Ct Head (brain) Wo Contrast  08/30/2014   CLINICAL DATA:  Right-sided weakness.  EXAM: CT HEAD WITHOUT CONTRAST  TECHNIQUE: Contiguous axial images were obtained from the base of the skull through the vertex without intravenous contrast.  COMPARISON:  None.  FINDINGS: No acute intracranial abnormality. Specifically, no hemorrhage, hydrocephalus, mass lesion, acute infarction, or significant intracranial injury. No acute calvarial abnormality. Visualized paranasal sinuses and mastoids clear. Orbital soft tissues unremarkable.  IMPRESSION: Negative.  Critical Value/emergent results were  called by telephone at the time of interpretation on 08/30/2014 at 9:57 pm to Dr. Roseanne Reno , who verbally acknowledged these results.   Electronically Signed   By: Charlett Nose M.D.   On: 08/30/2014 21:57   Mri Brain Without Contrast  09/01/2014   CLINICAL DATA:  Right upper and lower extremity weakness and aphasia. The extremity symptoms have near completely resolved. The patient continues to have some speech difficulties.  EXAM: MRI HEAD WITHOUT CONTRAST  MRA HEAD WITHOUT CONTRAST  TECHNIQUE: Multiplanar, multiecho pulse sequences of the brain and surrounding structures were obtained without intravenous contrast. Angiographic images of the head were obtained using MRA technique without contrast.  COMPARISON:  CT head without contrast 08/30/2014.  FINDINGS: MRI HEAD FINDINGS  Scattered foci of restricted diffusion are present within the cortical and subcortical regions of  the medial right frontal lobe, following the left ACA distribution. T2 changes are associated with the infarct, cysts compatible with a subacute time frame. No other acute infarcts are present. There is no significant white matter disease. The ventricles are of normal size. No significant extraaxial fluid collection is present.  Flow is present in the major intracranial arteries. The patient is status post bilateral lens replacements. Midline structures are normal. The globes and orbits are otherwise intact. Mild mucosal thickening is present within the ethmoid air cells and sphenoid sinuses bilaterally.  MRA HEAD FINDINGS  The internal carotid arteries are within normal limits from the high cervical segments through the ICA termini bilaterally. The A1 and M1 segments are normal. Anterior communicating artery is patent. The MCA bifurcations are intact bilaterally. The ACA and MCA branch vessels are within normal limits.  The left vertebral artery is the dominant vessel. The right vertebral artery essentially terminates at the PICA sending only  a very small branch to the vertebrobasilar junction. The basilar artery is within normal limits. A prominent left posterior communicating artery is present. A smaller right posterior communicating artery is present. P1 segments are evident bilaterally. The PCA branch vessels are within normal limits.  IMPRESSION: 1. Left ACA territory infarcts in the high medial left frontal lobe without hemorrhage. 2. No focal stenosis or ACA occlusion. 3. No other significant white matter disease or other acute infarcts. These results will be called to the ordering clinician or representative by the Radiologist Assistant, and communication documented in the PACS or zVision Dashboard.   Electronically Signed   By: Gennette Pachris  Mattern M.D.   On: 09/01/2014 10:18   Dg Chest Port 1 View  08/31/2014   CLINICAL DATA:  Tobacco abuse. History of Raynaud's disease, heart murmur, hypercholesterolemia.  EXAM: PORTABLE CHEST - 1 VIEW  COMPARISON:  None.  FINDINGS: Heart size is upper limits normal. Mild perihilar bronchitic changes are identified. Lungs are otherwise clear. No pulmonary edema. Visualized osseous structures have a normal appearance.  IMPRESSION: 1. Mild bronchitic change. 2.  No evidence for acute cardiopulmonary abnormality.   Electronically Signed   By: Rosalie GumsBeth  Brown M.D.   On: 08/31/2014 09:47   Mr Maxine GlennMra Head/brain Wo Cm  09/01/2014   CLINICAL DATA:  Right upper and lower extremity weakness and aphasia. The extremity symptoms have near completely resolved. The patient continues to have some speech difficulties.  EXAM: MRI HEAD WITHOUT CONTRAST  MRA HEAD WITHOUT CONTRAST  TECHNIQUE: Multiplanar, multiecho pulse sequences of the brain and surrounding structures were obtained without intravenous contrast. Angiographic images of the head were obtained using MRA technique without contrast.  COMPARISON:  CT head without contrast 08/30/2014.  FINDINGS: MRI HEAD FINDINGS  Scattered foci of restricted diffusion are present within the  cortical and subcortical regions of the medial right frontal lobe, following the left ACA distribution. T2 changes are associated with the infarct, cysts compatible with a subacute time frame. No other acute infarcts are present. There is no significant white matter disease. The ventricles are of normal size. No significant extraaxial fluid collection is present.  Flow is present in the major intracranial arteries. The patient is status post bilateral lens replacements. Midline structures are normal. The globes and orbits are otherwise intact. Mild mucosal thickening is present within the ethmoid air cells and sphenoid sinuses bilaterally.  MRA HEAD FINDINGS  The internal carotid arteries are within normal limits from the high cervical segments through the ICA termini bilaterally. The A1 and M1 segments  are normal. Anterior communicating artery is patent. The MCA bifurcations are intact bilaterally. The ACA and MCA branch vessels are within normal limits.  The left vertebral artery is the dominant vessel. The right vertebral artery essentially terminates at the PICA sending only a very small branch to the vertebrobasilar junction. The basilar artery is within normal limits. A prominent left posterior communicating artery is present. A smaller right posterior communicating artery is present. P1 segments are evident bilaterally. The PCA branch vessels are within normal limits.  IMPRESSION: 1. Left ACA territory infarcts in the high medial left frontal lobe without hemorrhage. 2. No focal stenosis or ACA occlusion. 3. No other significant white matter disease or other acute infarcts. These results will be called to the ordering clinician or representative by the Radiologist Assistant, and communication documented in the PACS or zVision Dashboard.   Electronically Signed   By: Gennette Pachris  Mattern M.D.   On: 09/01/2014 10:18    Jeralyn BennettZAMORA, Shalaina Guardiola, MD  [facility] Pager:[phone]  If 7PM-7AM, please contact  night-coverage [email or web address] Password [email or web address] 09/04/2014, 5:18 PM   LOS: 5 days

## 2014-09-04 NOTE — Progress Notes (Signed)
CARE MANAGEMENT NOTE 09/04/2014  Patient:  Dawn Escobar,Dawn Escobar   Account Number:  [id number]  Date Initiated:  09/04/2014  Documentation initiated by:  [facility]My[facility] Lab[facility]  Subjective/Objective Assessment:   Left ACA territory infarcts in the high medial left frontal lobe     Action/Plan:   lives in extended stay hotel with boyfriend   Anticipated DC Date:     Anticipated DC Plan:  HOME W HOME HEALTH SERVICES      DC Planning Services  CM consult      [facility]   Choice offered to / List presented to:  C-1 Patient        HH arranged  HH-2 PT      [facility]  [facility]   Status of service:  Completed, signed off Medicare Important Message given?  YES (If response is "NO", the following Medicare IM given date fields will be blank) Date Medicare IM given:  09/04/2014 Medicare IM given by:  [facility]Mavery Milling Date Additional Medicare IM given:   Additional Medicare IM given by:    Discharge Disposition:  HOME W HOME HEALTH SERVICES  Per UR Regulation:    If discussed at Long Length of Stay Meetings, dates discussed:    Comments:  09/04/2014 1500 NCM spoke to pt and [facility] She is not from this area. [facility] Pt states she has cane. Pt states she does not have PCP. Explained she will need to follow up with Social Security Admin office to have Medicaid switched to [place]. [facility] at 9 am for hospital follow up. [facility] cannot come out if pt does not provide a physical address at time of dc. Isidoro DonningAlesia Jya Hughston RN CCM Case Mgmt phone [phone]

## 2014-09-04 NOTE — Progress Notes (Addendum)
STROKE TEAM PROGRESS NOTE   HISTORY Dawn Escobar is a 53 y.o. female with a history of hypercholesterolemia, posttraumatic stress disorder, Raynaud's disease and anxiety who experienced acute onset of right-sided weakness and numbness at 8:40 PM on 08/31/14.Marland Kitchen. She has no previous history of stroke nor TIA. She has not been on antiplatelet therapy. CT scan of her head showed no acute intracranial abnormality. Deficits improved after arriving in the emergency room, including return of her speech to normal as well as use of her right upper extremity. Deficits involving right lower extremity as well as sensory complaints were felt to likely be functional in etiology. Because her symptoms were improving and there were no clear objective deficits, TPA was not administered. NIH stroke score was 5.  LSN: 8:40 PM on 08/30/2014 tPA Given: No: Improving deficits and lack of clear objective remaining focal deficits   SUBJECTIVE (INTERVAL HISTORY): She still has slight headache and back pain. Otherwise no changes overnight. TEE postponed to tomorrow due to busy schedule. She has rash on her back being treated with steroids    OBJECTIVE Temp:  [97.6 F (36.4 C)-99 F (37.2 C)] 97.6 F (36.4 C) (11/30 0854) Pulse Rate:  [51-68] 57 (11/30 0854) Cardiac Rhythm:  [-]  Resp:  [16-20] 20 (11/30 0854) BP: (102-125)/(41-64) 111/48 mmHg (11/30 0854) SpO2:  [98 %-100 %] 99 % (11/30 0854)   Recent Labs Lab 09/03/14 1130 09/03/14 1634 09/03/14 2142 09/04/14 0619 09/04/14 1133  GLUCAP 91 117* 126* 106* 102*    Recent Labs Lab 08/30/14 2143 08/30/14 2156 08/31/14 0500  NA 140 141 144  K 3.9 3.9 3.8  CL 104 102 108  CO2 24  --  25  GLUCOSE 101* 103* 96  BUN 9 8 8   CREATININE 0.69 0.80 0.62  0.63  CALCIUM 8.8  --  8.7    Recent Labs Lab 08/30/14 2143 08/31/14 0500  AST 14 12  ALT 8 7  ALKPHOS 71 65  BILITOT 0.2* 0.3  PROT 7.1 6.5  ALBUMIN 3.7 3.3*    Recent Labs Lab 08/30/14 2143  08/30/14 2156 08/31/14 0500  WBC 7.4  --  6.4  6.7  NEUTROABS 4.9  --  3.6  HGB 12.0 12.9 11.8*  11.6*  HCT 35.7* 38.0 35.0*  34.2*  MCV 94.7  --  94.3  92.9  PLT 158  --  162  164   No results for input(s): CKTOTAL, CKMB, CKMBINDEX, TROPONINI in the last 168 hours. No results for input(s): LABPROT, INR in the last 72 hours.  Recent Labs  09/03/14 1819  COLORURINE YELLOW  LABSPEC 1.005  PHURINE 6.0  GLUCOSEU NEGATIVE  HGBUR MODERATE*  BILIRUBINUR NEGATIVE  KETONESUR NEGATIVE  PROTEINUR NEGATIVE  UROBILINOGEN 0.2  NITRITE NEGATIVE  LEUKOCYTESUR NEGATIVE       Component Value Date/Time   CHOL 103 08/31/2014 0500   TRIG 47 08/31/2014 0500   HDL 48 08/31/2014 0500   CHOLHDL 2.1 08/31/2014 0500   VLDL 9 08/31/2014 0500   LDLCALC 46 08/31/2014 0500   Lab Results  Component Value Date   HGBA1C 5.6 08/31/2014      Component Value Date/Time   LABOPIA NONE DETECTED 08/31/2014 0254   COCAINSCRNUR NONE DETECTED 08/31/2014 0254   LABBENZ NONE DETECTED 08/31/2014 0254   AMPHETMU NONE DETECTED 08/31/2014 0254   THCU NONE DETECTED 08/31/2014 0254   LABBARB NONE DETECTED 08/31/2014 0254    No results for input(s): ETH in the last 168 hours.   2-D echocardiogram  09/01/2014 Left ventricle: The cavity size was normal. Wall thickness was normal. Systolic function was normal. The estimated ejection fraction was in the range of 60% to 65%. Wall motion was normal; there were no regional wall motion abnormalities. Left ventricular diastolic function parameters were normal. - Aortic valve: Sclerosis without stenosis. There was mild central regurgitation. Regurgitation pressure half-time: 467 ms. - Mitral valve: Mildly thickened leaflets . There was mild regurgitation. - Left atrium: The atrium was normal in size. - Right atrium: The atrium was normal in size. Central venous pressure (est): 8 mm Hg. - Atrial septum: No defect or patent foramen ovale was  identified. - Tricuspid valve: There was mild regurgitation. - Pulmonary arteries: PA peak pressure: 33 mm Hg (S). - Inferior vena cava: The vessel was dilated. The respirophasic diameter changes were blunted (< 50%), consistent with elevated central venous pressure.  Impressions:  - LVEF 60-65%, normal wall thickness, normal wall motion, normal diastolic function and chamber sizes, mild TR, top normal RVSP with elevated RA pressure. Aortic valve sclerosis with mild AI and mitral valve leaflet thickening with mild MR.  Ct Head (brain) Wo Contrast 08/30/2014    Negative.     Dg Chest Port 1 View 08/31/2014    1. Mild bronchitic change.  2.  No evidence for acute cardiopulmonary abnormality.    MRI / MRA 09/01/2014 1. Left ACA territory infarcts in the high medial left frontal lobe without hemorrhage. 2. No focal stenosis or ACA occlusion. 3. No other significant white matter disease or other acute infarcts.     PHYSICAL EXAM NAD. Conversant. Awake alert. Afebrile. Head is nontraumatic. Neck is supple without bruit. Hearing is normal. Cardiac exam no murmur or gallop. Lungs are clear to auscultation. Distal pulses are well felt. Neurological Exam : Awake alert oriented x 3 normal speech and language. Visual fields full. Pupils equally round and reactive. Extraocular movements intact. Mild right lower face asymmetry. Tongue midline. No drift. Mild right-sided weakness 4/5 right arm and leg.   Mild right grip weak. No dysmetria appreciated. Some decreased right sensory changes. DTRs symmetric bilaterally. Toes downgoing.    ASSESSMENT/PLAN [name] is a 53 y.o. female with history of hypercholesterolemia, posttraumatic stress disorder, Raynaud's disease, tobacco use, and anxiety, presenting with right hemiparesis and hemisensory deficits. She did not receive IV t-PA due to resolving deficits.    Stroke:  Dominant. Head CT negative. MRI/MRA - see below.  Probably embolic.  Resultant   Mild right leg weakness  MRI - Left ACA territory infarcts in the high medial left frontal lobe   MRA - no high-grade stenosis  Carotid Doppler - Bilateral 1-39% ICA stenosis. Vertebral artery flow is antegrade.   2D Echo - pending  LDL- 46  HgbA1c 5.6  Lovenox for VTE prophylaxis  Diet Heart with thin liquids  No antithrombotics prior to admission, now on aspirin 325 mg orally every day  Patient counseled to be compliant with her antithrombotic medications  Ongoing aggressive stroke risk factor management  Therapy recommendations:  Outpatient physical therapy recommended.  Disposition:  Pending  Hypertension  Home meds:   Cardura 12 mg daily  Stable  Hyperlipidemia  Home meds:  Crestor 20 mg daily resumed in hospital   LDL 46, goal < 70  Continue statin at discharge   Other Stroke Risk Factors  Cigarette smoker, advised to stop smoking  ETOH use   Other Active Problems  Mild anemia  Other Pertinent History  Tobacco use  TEE  and Loop to be performed Tuesday  Hospital day # 5  Delton See PA-C [facility] Pager [phone] 09/04/2014, 1:43 PM  Personally examined patient and images, agree with history, physical, neuro exam as stated above. Agree with assessment and plan.   Delia Heady, MD   To contact Stroke Continuity provider, please refer to [email or web address]. After hours, contact General Neurology
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
## 2014-09-04 NOTE — Plan of Care (Signed)
Problem: Progression Outcomes Goal: Tolerating diet/TF at goal rate Outcome: Progressing     

## 2014-09-05 ENCOUNTER — Encounter (HOSPITAL_COMMUNITY)
Admission: EM | Disposition: A | Discharge status: Home Health | Payer: Self-pay | Source: Home / Self Care | Attending: Internal Medicine

## 2014-09-05 ENCOUNTER — Encounter (HOSPITAL_COMMUNITY): Payer: Self-pay | Admitting: *Deleted

## 2014-09-05 DIAGNOSIS — I639 Cerebral infarction, unspecified: Secondary | ICD-10-CM

## 2014-09-05 DIAGNOSIS — I340 Nonrheumatic mitral (valve) insufficiency: Secondary | ICD-10-CM

## 2014-09-05 HISTORY — PX: TEE WITHOUT CARDIOVERSION: SHX5443

## 2014-09-05 HISTORY — PX: LOOP RECORDER IMPLANT: SHX5477

## 2014-09-05 SURGERY — ECHOCARDIOGRAM, TRANSESOPHAGEAL
Anesthesia: Moderate Sedation

## 2014-09-05 SURGERY — LOOP RECORDER IMPLANT
Anesthesia: LOCAL

## 2014-09-05 MED ORDER — BUTAMBEN-TETRACAINE-BENZOCAINE 2-2-14 % EX AERO
INHALATION_SPRAY | CUTANEOUS | Status: DC | PRN
  Administered 2014-09-05: 1 via TOPICAL

## 2014-09-05 MED ORDER — DIPHENHYDRAMINE HCL 50 MG/ML IJ SOLN
INTRAMUSCULAR | Status: AC
  Filled 2014-09-05: qty 1

## 2014-09-05 MED ORDER — MIDAZOLAM HCL 5 MG/ML IJ SOLN
INTRAMUSCULAR | Status: AC
  Filled 2014-09-05: qty 2

## 2014-09-05 MED ORDER — METHYLPREDNISOLONE SODIUM SUCC 125 MG IJ SOLR
60.0000 mg | Freq: Four times a day (QID) | INTRAMUSCULAR | Status: DC
  Administered 2014-09-05 – 2014-09-06 (×4): 60 mg via INTRAVENOUS
  Filled 2014-09-05 (×4): qty 2

## 2014-09-05 MED ORDER — DIPHENHYDRAMINE HCL 50 MG/ML IJ SOLN
INTRAMUSCULAR | Status: DC | PRN
  Administered 2014-09-05: 25 mg via INTRAVENOUS

## 2014-09-05 MED ORDER — FENTANYL CITRATE 0.05 MG/ML IJ SOLN
INTRAMUSCULAR | Status: AC
  Filled 2014-09-05: qty 2

## 2014-09-05 MED ORDER — LIDOCAINE-EPINEPHRINE 1 %-1:100000 IJ SOLN
INTRAMUSCULAR | Status: AC
  Filled 2014-09-05: qty 1

## 2014-09-05 MED ORDER — DIPHENHYDRAMINE HCL 50 MG/ML IJ SOLN
12.5000 mg | Freq: Three times a day (TID) | INTRAMUSCULAR | Status: DC
Start: 2014-09-05 — End: 2014-09-06
  Administered 2014-09-05 – 2014-09-06 (×4): 12.5 mg via INTRAVENOUS
  Filled 2014-09-05 (×4): qty 1

## 2014-09-05 MED ORDER — MIDAZOLAM HCL 10 MG/2ML IJ SOLN
INTRAMUSCULAR | Status: DC | PRN
  Administered 2014-09-05 (×2): 2 mg via INTRAVENOUS
  Administered 2014-09-05: 1 mg via INTRAVENOUS
  Administered 2014-09-05 (×2): 2 mg via INTRAVENOUS

## 2014-09-05 MED ORDER — FENTANYL CITRATE 0.05 MG/ML IJ SOLN
INTRAMUSCULAR | Status: DC | PRN
  Administered 2014-09-05 (×4): 25 ug via INTRAVENOUS

## 2014-09-05 NOTE — Progress Notes (Signed)
STROKE TEAM PROGRESS NOTE   HISTORY Dawn Escobar is a 53 y.o. female with a history of hypercholesterolemia, posttraumatic stress disorder, Raynaud's disease and anxiety who experienced acute onset of right-sided weakness and numbness at 8:40 PM on 08/31/14.Marland Kitchen She has no previous history of stroke nor TIA. She has not been on antiplatelet therapy. CT scan of her head showed no acute intracranial abnormality. Deficits improved after arriving in the emergency room, including return of her speech to normal as well as use of her right upper extremity. Deficits involving right lower extremity as well as sensory complaints were felt to likely be functional in etiology. Because her symptoms were improving and there were no clear objective deficits, TPA was not administered. NIH stroke score was 5.  LSN: 8:40 PM on 08/30/2014 tPA Given: No: Improving deficits and lack of clear objective remaining focal deficits   SUBJECTIVE (INTERVAL HISTORY): She still has slight headache and back pain. Otherwise no changes overnight. TEE normal and loop recorder pending.. She has rash on her back being treated with steroids    OBJECTIVE Temp:  [97.4 F (36.3 C)-98.1 F (36.7 C)] 97.9 F (36.6 C) (12/01 1040) Pulse Rate:  [51-66] 56 (12/01 1040) Cardiac Rhythm:  [-] Normal sinus rhythm (11/30 2020) Resp:  [9-21] 16 (12/01 1040) BP: (102-146)/(44-82) 113/60 mmHg (12/01 1040) SpO2:  [95 %-100 %] 100 % (12/01 1040)   Recent Labs Lab 09/03/14 1634 09/03/14 2142 09/04/14 0619 09/04/14 1133 09/04/14 1643  GLUCAP 117* 126* 106* 102* 133*    Recent Labs Lab 08/30/14 2143 08/30/14 2156 08/31/14 0500  NA 140 141 144  K 3.9 3.9 3.8  CL 104 102 108  CO2 24  --  25  GLUCOSE 101* 103* 96  BUN 9 8 8   CREATININE 0.69 0.80 0.62  0.63  CALCIUM 8.8  --  8.7    Recent Labs Lab 08/30/14 2143 08/31/14 0500  AST 14 12  ALT 8 7  ALKPHOS 71 65  BILITOT 0.2* 0.3  PROT 7.1 6.5  ALBUMIN 3.7 3.3*     Recent Labs Lab 08/30/14 2143 08/30/14 2156 08/31/14 0500  WBC 7.4  --  6.4  6.7  NEUTROABS 4.9  --  3.6  HGB 12.0 12.9 11.8*  11.6*  HCT 35.7* 38.0 35.0*  34.2*  MCV 94.7  --  94.3  92.9  PLT 158  --  162  164   No results for input(s): CKTOTAL, CKMB, CKMBINDEX, TROPONINI in the last 168 hours. No results for input(s): LABPROT, INR in the last 72 hours.  Recent Labs  09/03/14 1819  COLORURINE YELLOW  LABSPEC 1.005  PHURINE 6.0  GLUCOSEU NEGATIVE  HGBUR MODERATE*  BILIRUBINUR NEGATIVE  KETONESUR NEGATIVE  PROTEINUR NEGATIVE  UROBILINOGEN 0.2  NITRITE NEGATIVE  LEUKOCYTESUR NEGATIVE       Component Value Date/Time   CHOL 103 08/31/2014 0500   TRIG 47 08/31/2014 0500   HDL 48 08/31/2014 0500   CHOLHDL 2.1 08/31/2014 0500   VLDL 9 08/31/2014 0500   LDLCALC 46 08/31/2014 0500   Lab Results  Component Value Date   HGBA1C 5.6 08/31/2014      Component Value Date/Time   LABOPIA NONE DETECTED 08/31/2014 0254   COCAINSCRNUR NONE DETECTED 08/31/2014 0254   LABBENZ NONE DETECTED 08/31/2014 0254   AMPHETMU NONE DETECTED 08/31/2014 0254   THCU NONE DETECTED 08/31/2014 0254   LABBARB NONE DETECTED 08/31/2014 0254    No results for input(s): ETH in the last 168 hours.  2-D echocardiogram 09/01/2014 Left ventricle: The cavity size was normal. Wall thickness was normal. Systolic function was normal. The estimated ejection fraction was in the range of 60% to 65%. Wall motion was normal; there were no regional wall motion abnormalities. Left ventricular diastolic function parameters were normal. - Aortic valve: Sclerosis without stenosis. There was mild central regurgitation. Regurgitation pressure half-time: 467 ms. - Mitral valve: Mildly thickened leaflets . There was mild regurgitation. - Left atrium: The atrium was normal in size. - Right atrium: The atrium was normal in size. Central venous pressure (est): 8 mm Hg. - Atrial septum: No  defect or patent foramen ovale was identified. - Tricuspid valve: There was mild regurgitation. - Pulmonary arteries: PA peak pressure: 33 mm Hg (S). - Inferior vena cava: The vessel was dilated. The respirophasic diameter changes were blunted (< 50%), consistent with elevated central venous pressure.  Impressions:  - LVEF 60-65%, normal wall thickness, normal wall motion, normal diastolic function and chamber sizes, mild TR, top normal RVSP with elevated RA pressure. Aortic valve sclerosis with mild AI and mitral valve leaflet thickening with mild MR.  Ct Head (brain) Wo Contrast 08/30/2014    Negative.     Dg Chest Port 1 View 08/31/2014    1. Mild bronchitic change.  2.  No evidence for acute cardiopulmonary abnormality.    MRI / MRA 09/01/2014 1. Left ACA territory infarcts in the high medial left frontal lobe without hemorrhage. 2. No focal stenosis or ACA occlusion. 3. No other significant white matter disease or other acute infarcts.     PHYSICAL EXAM NAD. Conversant. Awake alert. Afebrile. Head is nontraumatic. Neck is supple without bruit. Hearing is normal. Cardiac exam no murmur or gallop. Lungs are clear to auscultation. Distal pulses are well felt. Neurological Exam : Awake alert oriented x 3 normal speech and language. Visual fields full. Pupils equally round and reactive. Extraocular movements intact. Mild right lower face asymmetry. Tongue midline. No drift. Mild right-sided weakness 4/5 right arm and leg.   Mild right grip weak. No dysmetria appreciated. Some decreased right sensory changes. DTRs symmetric bilaterally. Toes downgoing.    ASSESSMENT/PLAN Dawn Escobar is a 53 y.o. female with history of hypercholesterolemia, posttraumatic stress disorder, Raynaud's disease, tobacco use, and anxiety, presenting with right hemiparesis and hemisensory deficits. She did not receive IV t-PA due to resolving deficits.    Stroke:  Dominant. Head CT  negative. MRI/MRA - see below. Probably embolic.  Resultant   Mild right leg weakness  MRI - Left ACA territory infarcts in the high medial left frontal lobe   MRA - no high-grade stenosis  Carotid Doppler - Bilateral 1-39% ICA stenosis. Vertebral artery flow is antegrade.   2D Echo - pending  LDL- 46  HgbA1c 5.6  Lovenox for VTE prophylaxis  Diet Heart with thin liquids  No antithrombotics prior to admission, now on aspirin 325 mg orally every day  Patient counseled to be compliant with her antithrombotic medications  Ongoing aggressive stroke risk factor management  Therapy recommendations:  Outpatient physical therapy recommended.  Disposition:  Pending  Hypertension  Home meds:   Cardura 12 mg daily  Stable  Hyperlipidemia  Home meds:  Crestor 20 mg daily resumed in hospital   LDL 46, goal < 70  Continue statin at discharge   Other Stroke Risk Factors  Cigarette smoker, advised to stop smoking  ETOH use   Other Active Problems  Mild anemia  Other Pertinent History  Tobacco use  TEE and Loop to be performed Tuesday  Hospital day # 6 Personally examined patient and images, agree with history, physical, neuro exam as stated above. Agree with assessment and plan. Stroke team will sign off. Follow up in stroke clinic in 4 weeks.  Delia HeadyPramod Shilpa Bushee, MD     09/05/2014, 1:53 PM    To contact Stroke Continuity provider, please refer to [email or web address]. After hours, contact General Neurology

## 2014-09-05 NOTE — Consult Note (Addendum)
ELECTROPHYSIOLOGY CONSULT NOTE  Patient ID: Dawn Escobar MRN: [id number], DOB/AGE: [birth year]   Admit date: 08/30/2014 Date of Consult: 09/05/2014  Primary Physician: [facility] Primary Cardiologist: [facility] Reason for Consultation: Cryptogenic stroke; recommendations regarding Implantable Loop Recorder  History of Present Illness Dawn Escobar was admitted on 08/30/2014 with right hemiparesis.  Imaging demonstrated left ACA territory infarcts in the high medial left frontal lobe.  She has undergone workup for stroke including echocardiogram and carotid dopplers.  The patient has been monitored on telemetry which has demonstrated sinus rhythm with no arrhythmias.  Inpatient stroke work-up is to be completed with a TEE.   Echocardiogram this admission demonstrated EF 60-65%, no RWMA, mild central AR, LA 33.  Lab work is reviewed.   Prior to admission, the patient denies chest pain, shortness of breath, dizziness, palpitations, or syncope.  They are recovering from their stroke with plans to return home at discharge.  EP has been asked to evaluate for placement of an implantable loop recorder to monitor for atrial fibrillation.  ROS is negative except as outlined above.    Past Medical History  Diagnosis Date  . PTSD (post-traumatic stress disorder)   . Raynaud's disease   . Heart murmur   . Anxiety   . Hypercholesteremia      Surgical History:  Past Surgical History  Procedure Laterality Date  . Wrist surgery    . Abdominal hysterectomy    . Knee surgery    . Hip surgery  Right     Prescriptions prior to admission  Medication Sig Dispense Refill Last Dose  . clonazePAM (KLONOPIN) 0.5 MG tablet Take 0.5 mg by mouth 3 (three) times daily as needed for anxiety.   08/30/2014 at Unknown time  . cyclobenzaprine (FLEXERIL) 5 MG tablet Take 5 mg by mouth 3 (three) times daily as needed for muscle spasms.    08/30/2014 at Unknown time  . doxazosin (CARDURA) 8 MG tablet Take 12 mg by mouth at bedtime.    08/30/2014 at Unknown time  . esomeprazole (NEXIUM) 40 MG capsule Take 40 mg by mouth daily.   08/30/2014 at Unknown time  . gabapentin (NEURONTIN) 800 MG tablet Take 800 mg by mouth 3 (three) times daily.   08/30/2014 at Unknown time  . guaiFENesin (MUCINEX) 600 MG 12 hr tablet Take 600 mg by mouth 2 (two) times daily as needed for cough.   Past Week at Unknown time  . rosuvastatin (CRESTOR) 20 MG tablet Take 20 mg by mouth at bedtime.   08/30/2014 at Unknown time  . traZODone (DESYREL) 100 MG tablet Take 100 mg by mouth at bedtime as needed for sleep.   Past Month at Unknown time  . vitamin C (ASCORBIC ACID) 500 MG tablet Take 500 mg by mouth daily.   08/30/2014 at Unknown time    Inpatient Medications:  . aspirin  325 mg Oral Daily  . doxazosin  2 mg Oral QHS  . enoxaparin (LOVENOX) injection  40 mg Subcutaneous Q24H  . feeding supplement (ENSURE COMPLETE)  237 mL Oral BID PC  . gabapentin  800 mg Oral TID  . hydrocortisone cream   Topical TID  . pantoprazole  40 mg Oral Daily  . predniSONE  60 mg Oral Q breakfast  . rosuvastatin  20 mg Oral q1800    Allergies:  Allergies  Allergen Reactions  . Morphine And Related Hives  . Tape Rash  History   Social History  . Marital Status: Single    Spouse Name: N/A    Number of Children: N/A  . Years of Education: N/A   Occupational History  . Not on file.   Social History Main Topics  . Smoking status: Current Every Day Smoker  . Smokeless tobacco: Not on file  . Alcohol Use: Yes  . Drug Use: No  . Sexual Activity: Not on file   Other Topics Concern  . Not on file   Social History Narrative     Family History  Problem Relation Age of Onset  . Colon cancer Brother      Physical Exam: Filed Vitals:   09/04/14 1647 09/04/14 2127 09/05/14 0204 09/05/14 0621  BP: 130/49 140/65 125/57 111/57  Pulse: 60 66 57 59  Temp:  98.1 F (36.7 C) 98.1 F (36.7 C) 97.9 F (36.6 C) 98 F (36.7 C)  TempSrc: Oral Oral Oral Oral  Resp: 20 18 16 18   Height:      Weight:      SpO2: 99%       GEN- The patient is well appearing, alert and oriented x 3 today.   Head- normocephalic, atraumatic Eyes-  Sclera clear, conjunctiva pink Ears- hearing intact Oropharynx- clear Neck- supple, Lungs- Clear to ausculation bilaterally, normal work of breathing Heart- Regular rate and rhythm, no murmurs, rubs or gallops, PMI not laterally displaced GI- soft, NT, ND, + BS Extremities- no clubbing, cyanosis, or edema MS- no significant deformity or atrophy Skin- left sided whelps that the patient describes as pruritic.  Psych- euthymic mood, full affect   Labs:   Lab Results  Component Value Date   WBC 6.4 08/31/2014   WBC 6.7 08/31/2014   HGB 11.8* 08/31/2014   HGB 11.6* 08/31/2014   HCT 35.0* 08/31/2014   HCT 34.2* 08/31/2014   MCV 94.3 08/31/2014   MCV 92.9 08/31/2014   PLT 162 08/31/2014   PLT 164 08/31/2014    Recent Labs Lab 08/31/14 0500  NA 144  K 3.8  CL 108  CO2 25  BUN 8  CREATININE 0.62  0.63  CALCIUM 8.7  PROT 6.5  BILITOT 0.3  ALKPHOS 65  ALT 7  AST 12  GLUCOSE 96     Radiology/Studies: Ct Head (brain) Wo Contrast 08/30/2014   CLINICAL DATA:  Right-sided weakness.  EXAM: CT HEAD WITHOUT CONTRAST  TECHNIQUE: Contiguous axial images were obtained from the base of the skull through the vertex without intravenous contrast.  COMPARISON:  None.  FINDINGS: No acute intracranial abnormality. Specifically, no hemorrhage, hydrocephalus, mass lesion, acute infarction, or significant intracranial injury. No acute calvarial abnormality. Visualized paranasal sinuses and mastoids clear. Orbital soft tissues unremarkable.  IMPRESSION: Negative.  Critical Value/emergent results were called by telephone at the time of interpretation on 08/30/2014 at 9:57 pm to Dr. Roseanne RenoStewart , who verbally acknowledged these  results.   Electronically Signed   By: Charlett NoseKevin  Dover M.D.   On: 08/30/2014 21:57   Mri Brain Without Contrast 09/01/2014   CLINICAL DATA:  Right upper and lower extremity weakness and aphasia. The extremity symptoms have near completely resolved. The patient continues to have some speech difficulties.  EXAM: MRI HEAD WITHOUT CONTRAST  MRA HEAD WITHOUT CONTRAST  TECHNIQUE: Multiplanar, multiecho pulse sequences of the brain and surrounding structures were obtained without intravenous contrast. Angiographic images of the head were obtained using MRA technique without contrast.  COMPARISON:  CT head without contrast 08/30/2014.  FINDINGS: MRI  HEAD FINDINGS  Scattered foci of restricted diffusion are present within the cortical and subcortical regions of the medial right frontal lobe, following the left ACA distribution. T2 changes are associated with the infarct, cysts compatible with a subacute time frame. No other acute infarcts are present. There is no significant white matter disease. The ventricles are of normal size. No significant extraaxial fluid collection is present.  Flow is present in the major intracranial arteries. The patient is status post bilateral lens replacements. Midline structures are normal. The globes and orbits are otherwise intact. Mild mucosal thickening is present within the ethmoid air cells and sphenoid sinuses bilaterally.  MRA HEAD FINDINGS  The internal carotid arteries are within normal limits from the high cervical segments through the ICA termini bilaterally. The A1 and M1 segments are normal. Anterior communicating artery is patent. The MCA bifurcations are intact bilaterally. The ACA and MCA branch vessels are within normal limits.  The left vertebral artery is the dominant vessel. The right vertebral artery essentially terminates at the PICA sending only a very small branch to the vertebrobasilar junction. The basilar artery is within normal limits. A prominent left posterior  communicating artery is present. A smaller right posterior communicating artery is present. P1 segments are evident bilaterally. The PCA branch vessels are within normal limits.  IMPRESSION: 1. Left ACA territory infarcts in the high medial left frontal lobe without hemorrhage. 2. No focal stenosis or ACA occlusion. 3. No other significant white matter disease or other acute infarcts. These results will be called to the ordering clinician or representative by the Radiologist Assistant, and communication documented in the PACS or zVision Dashboard.   Electronically Signed   By: Gennette Pac M.D.   On: 09/01/2014 10:18   Dg Chest Port 1 View 08/31/2014   CLINICAL DATA:  Tobacco abuse. History of Raynaud's disease, heart murmur, hypercholesterolemia.  EXAM: PORTABLE CHEST - 1 VIEW  COMPARISON:  None.  FINDINGS: Heart size is upper limits normal. Mild perihilar bronchitic changes are identified. Lungs are otherwise clear. No pulmonary edema. Visualized osseous structures have a normal appearance.  IMPRESSION: 1. Mild bronchitic change. 2.  No evidence for acute cardiopulmonary abnormality.   Electronically Signed   By: Rosalie Gums M.D.   On: 08/31/2014 09:47   12-lead ECG sinus rhythm, rate 65, normal intervals  Telemetry sinus rhythm  Assessment and Plan:  1. Cryptogenic stroke The patient presents with cryptogenic stroke.  The patient has a TEE planned for this AM.  I spoke at length with the patient about monitoring for afib with either a 30 day event monitor or an implantable loop recorder.  Risks, benefits, and alteratives to implantable loop recorder were discussed with the patient today.   At this time, the patient would like to further contemplate her options.  She is not ready to make up her mind.  She will think about it and let me know later today.  I explained that this could be considered electively as an outpatient if that were better for her.  Please call with questions.   Addendum She  has decided to proceed with implantable loop recorder placement.  We will therefore proceed later today.

## 2014-09-05 NOTE — Plan of Care (Signed)
Problem: Progression Outcomes Goal: If vent dependent, tolerates weaning Outcome: Not Applicable Date Met:  63/84/13 Goal: Rehab Team goals identified Outcome: Completed/Met Date Met:  09/05/14 Goal: Tolerating diet/TF at goal rate Outcome: Completed/Met Date Met:  09/05/14 Goal: Pain controlled Outcome: Progressing Goal: Educational plan initiated Outcome: Completed/Met Date Met:  09/05/14 Goal: Initial discharge plan initiated Outcome: Progressing  Problem: Discharge/Transitional Outcomes Goal: Hemodynamically stable Outcome: Not Applicable Date Met:  31/34/38 Goal: If vent dependent, trach in place Outcome: Not Applicable Date Met:  81/79/10

## 2014-09-05 NOTE — Progress Notes (Signed)
*  PRELIMINARY RESULTS* Echocardiogram Echocardiogram Transesophageal has been performed.  Jeryl ColumbiaLLIOTT, Jay Kempe 09/05/2014, 8:58 AM

## 2014-09-05 NOTE — Progress Notes (Signed)
Utilization review complete. Lucresia Simic RN CCM Case Mgmt phone [phone] 

## 2014-09-05 NOTE — CV Procedure (Signed)
   Transesophageal Echocardiogram: Indication:  TIA/CVA Sedation: Versed: 9, Fentanyl: 75, Other: 25 mg benedryl ASA: 2, Airway: 2  Procedure:  The patient was moderately sedated with the above doses of versed and fentanyl.  Using digital technique an omniplane probe was advanced into the distal esophagus without incident. Transgastric imaging revealed normal LV function with no RWMA;s and no mural apical thrombus. .  Estimated ejection fraction was 65%.  Right sided cardiac chambers were normal with no evidence of pulmonary hypertension.  The pulmonary and tricuspid valves were structurally normal.  There was mils TR The mitral valve was structurally normal with mild mitral regurgitation.    The aortic valve was trileaflet with mild AR The aortic root was normal.    Imaging of the septum showed no ASD or VSD Bubble study was negative for shunt 2D and color flow confirmed no PFO  The LAE was well visualized in orthogonal views.  There was no spontaneous contrast and no thrombus.    The descending thoracic aorta had no mural aortic debris with no evidence of aneurysmal dilation or disection  Impression:  1) No SOE 2) Mild MR 3) Mild AR 4) Normal EF 60% 5) No ASD/PFO negative bubble 6) No LAA thrombus 7) Normal RA 8) No mural aortic debris  Charlton HawsPeter Nishan 09/05/2014 8:45 AM
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
## 2014-09-05 NOTE — Interval H&P Note (Signed)
History and Physical Interval Note:  09/05/2014 8:25 AM  Dawn Escobar  has presented today for surgery, with the diagnosis of stroke  The various methods of treatment have been discussed with the patient and family. After consideration of risks, benefits and other options for treatment, the patient has consented to  Procedure(s) with comments: TRANSESOPHAGEAL ECHOCARDIOGRAM (TEE) (N/A) - loop after as a surgical intervention .  The patient's history has been reviewed, patient examined, no change in status, stable for surgery.  I have reviewed the patient's chart and labs.  Questions were answered to the patient's satisfaction.     Charlton HawsPeter Kayelee Herbig

## 2014-09-05 NOTE — Progress Notes (Signed)
Nutrition Education Note  RD consulted for nutrition education regarding a Heart Healthy diet. "Pt has questions post-CVA".  Lipid Panel     Component Value Date/Time   CHOL 103 08/31/2014 0500   TRIG 47 08/31/2014 0500   HDL 48 08/31/2014 0500   CHOLHDL 2.1 08/31/2014 0500   VLDL 9 08/31/2014 0500   LDLCALC 46 08/31/2014 0500    RD provided "Stroke Nutrition Therapy" handout from the Academy of [employer] Dietetics. Reviewed patient's dietary recall. Provided examples on ways to decrease sodium and fat intake in diet. Discouraged intake of processed foods and use of salt shaker. Encouraged fresh fruits and vegetables as well as whole grain sources of carbohydrates to maximize fiber intake. Teach back method used.  Expect good compliance.  Body mass index is 24.63 kg/(m^2). Pt meets criteria for Normal Weight based on current BMI.  Current diet order is Heart healthy, patient is consuming approximately 100% of meals at this time. RD contact information provided. RD following patient and will follow-up again Thursday.   Ian Malkineanne Barnett RD, LDN Inpatient Clinical Dietitian Pager: [phone] After Hours Pager: [phone]

## 2014-09-05 NOTE — CV Procedure (Signed)
SURGEON:  Hillis RangeJames Lynnix Schoneman, MD     PREPROCEDURE DIAGNOSIS:  Cryptogenic Stroke    POSTPROCEDURE DIAGNOSIS:  Cryptogenic Stroke     PROCEDURES:   1. Implantable loop recorder implantation    INTRODUCTION:  Dawn Escobar is a 53 y.o. female with a history of unexplained stroke who presents today for implantable loop implantation.  The patient has had a cryptogenic stroke.  Despite an extensive workup by neurology, no reversible causes have been identified.  she has worn telemetry during which she did not have arrhythmias.  There is significant concern for possible atrial fibrillation as the cause for the patients stroke.  The patient therefore presents today for implantable loop implantation.     DESCRIPTION OF PROCEDURE:  Informed written consent was obtained, and the patient was brought to the electrophysiology lab in a fasting state.  The patient required no sedation for the procedure today.  Mapping over the patient's chest was performed by the EP lab staff to identify the area where electrograms were most prominent for ILR recording.  This area was found to be the left parasternal region over the 3rd-4th intercostal space. The patients left chest was therefore prepped and draped in the usual sterile fashion by the EP lab staff. The skin overlying the left parasternal region was infiltrated with lidocaine for local analgesia.  A 0.5-cm incision was made over the left parasternal region over the 3rd intercostal space.  A subcutaneous ILR pocket was fashioned using a combination of sharp and blunt dissection.  A Medtronic Reveal Radar BaseLinq model [id number] SN [id number] S implantable loop recorder was then placed into the pocket  R waves were very prominent and measured 0.2349mV. EBL<1 ml.  Steri- Strips and a sterile dressing were then applied.  There were no early apparent complications.     CONCLUSIONS:   1. Successful implantation of a Medtronic Reveal LINQ implantable loop recorder for cryptogenic stroke  2. No  early apparent complications.

## 2014-09-05 NOTE — Progress Notes (Signed)
PATIENT DETAILS Name: Dawn Escobar Age: 53 y.o. Sex: female Date of Birth: 23-Aug-1961 Admit Date: 08/30/2014 Admitting Physician Eduard ClosArshad N Kakrakandy, MD PCP:No PCP Per Patient  Interim summary Patient is a pleasant 53 year old female with a past medical history of coronary artery disease, Raynaud's disease, tobacco abuse, dyslipidemia, was admitted to the medicine service on 08/31/2014. [place] She presented to the emergency department with complaints of right-sided weakness. She had not been on aspirin therapy prior to this hospitalization. A CT scan of brain performed on admission did not reveal acute intracranial abnormality. Given that stroke was a concern she was admitted to telemetry placed on stroke protocol with neurology consultation. She was started on aspirin therapy. Follow-up MRI of brain performed on 09/01/2014 revealed left ACA territory infarcts in the high medial frontal lobe without hemorrhage. MRA did not show focal stenosis or ACA occlusion. Transthoracic echocardiogram revealed an ejection fraction of 60-65%. Neurology recommended a transesophageal echocardiogram along with loop recorder implant. TEE performed on 09/05/2014 did not reveal evidence of thrombus. Loop recorder was placed by cardiology. Hospitalization complicated by the development of a pruritic erythematous rash involving trunk and legs. She was started on oral prednisone 60 mg by mouth daily on 09/04/2014. Rash appeared worse on 09/05/2014 for which I switched her to IV Solu-Medrol and started IV Benadryl. This could represent a drug rash, although her only new medications on this hospitalization are ASA and ultram. She reported taking aspirin in the past without any issuers. Ultram stopped.   Assessment/Plan: Principal Problem: Acute CVA:  -Admitted with right upper/right lower extremity weakness and aphasia. Symptoms almost resolved, speech still not fluent  but almost back to baseline.   -MRI brain confirms left ACA territory infarct. TTE negative for embolic source.Carotid Doppler negative for significant stenosis.  -Continue ASA 325 mg PO q daily -PT/OT recommending outpatient PT -A TEE performed on 09/05/2014 did not reveal evidence of thrombus. Loop recorder was placed on 09/05/2014 -Neurology signed off  Pruritic rash -Patient developing a pruritic erythematous rash involving back, buttock region, posterior aspect of thighs over the last 24-48 hours. -Given location of rash I initially suspected the possibility of contact dermatitis and placed nursing order for hypoallergenic sheets placed, starting prednisone 60 mg by mouth daily -Rash worse today, I wonder of the possibility of rash being drug related. Stopped Ultram. Will start IV SoluMedrol, IV benadryl, reassess in am.     Hypercholesterolemia:  -Continue crestor    Tobacco abuse: Also extensively, advised to quit.    History of the raynaud's phenomena:resume Doxazosin-but at a lower dose today      History of GERD: Continue with PPI    History of anxiety: Continue with when necessary clonazepam.    Disposition: Patient will likely be discharged to hotel  in the next 24 hours  Antibiotics:  None   Anti-infectives    None      DVT Prophylaxis: Prophylactic Lovenox  Code Status: Full code   Family Communication None at bedside  Procedures:  None  CONSULTS:  neurology   MEDICATIONS: Scheduled Meds: . aspirin  325 mg Oral Daily  . diphenhydrAMINE  12.5 mg Intravenous 3 times per day  . doxazosin  2 mg Oral QHS  . enoxaparin (LOVENOX) injection  40 mg Subcutaneous Q24H  . feeding supplement (ENSURE COMPLETE)  237 mL Oral BID PC  . gabapentin  800 mg Oral TID  . hydrocortisone cream  Topical TID  . methylPREDNISolone (SOLU-MEDROL) injection  60 mg Intravenous Q6H  . rosuvastatin  20 mg Oral q1800   Continuous Infusions:   PRN Meds:.acetaminophen,  clonazePAM, cyclobenzaprine, traZODone  PHYSICAL EXAM: Vital signs in last 24 hours: Filed Vitals:   09/05/14 0844 09/05/14 0853 09/05/14 0900 09/05/14 1040  BP: 133/69 102/44 102/47 113/60  Pulse: 60 52 51 56  Temp:    97.9 F (36.6 C)  TempSrc:    Oral  Resp: 11 12 9 16   Height:      Weight:      SpO2: 99% 95% 96% 100%    Weight change:  Filed Weights   08/31/14 0100  Weight: 67.132 kg (148 lb)   Body mass index is 24.63 kg/(m^2).   Gen Exam: Awake and alert with clear speech-but at times non fluent.   Neck: Supple, No JVD.   Chest: B/L Clear.   CVS: S1 S2 Regular, no murmurs.  Abdomen: soft, BS +, non tender, non distended.  Extremities: no edema, lower extremities warm to touch. Neurologic:Very minimal RUE/RLE weakness  Skin: Patient having erythema involving back, but attack region, posterior aspect of thighs. Erythema ill-defined Wounds: N/A.    Intake/Output from previous day: No intake or output data in the 24 hours ending 09/05/14 1328   LAB RESULTS: CBC  Recent Labs Lab 08/30/14 2143 08/30/14 2156 08/31/14 0500  WBC 7.4  --  6.4  6.7  HGB 12.0 12.9 11.8*  11.6*  HCT 35.7* 38.0 35.0*  34.2*  PLT 158  --  162  164  MCV 94.7  --  94.3  92.9  MCH 31.8  --  31.8  31.5  MCHC 33.6  --  33.7  33.9  RDW 12.7  --  12.7  12.8  LYMPHSABS 2.0  --  2.0  MONOABS 0.4  --  0.5  EOSABS 0.1  --  0.2  BASOSABS 0.0  --  0.1    Chemistries   Recent Labs Lab 08/30/14 2143 08/30/14 2156 08/31/14 0500  NA 140 141 144  K 3.9 3.9 3.8  CL 104 102 108  CO2 24  --  25  GLUCOSE 101* 103* 96  BUN 9 8 8   CREATININE 0.69 0.80 0.62  0.63  CALCIUM 8.8  --  8.7    CBG:  Recent Labs Lab 09/03/14 1634 09/03/14 2142 09/04/14 0619 09/04/14 1133 09/04/14 1643  GLUCAP 117* 126* 106* 102* 133*    GFR Estimated Creatinine Clearance: 74 mL/min (by C-G formula based on Cr of 0.62).  Coagulation profile  Recent Labs Lab 08/30/14 2143  INR 1.12     Cardiac Enzymes No results for input(s): CKMB, TROPONINI, MYOGLOBIN in the last 168 hours.  Invalid input(s): CK  Invalid input(s): POCBNP No results for input(s): DDIMER in the last 72 hours. No results for input(s): HGBA1C in the last 72 hours. No results for input(s): CHOL, HDL, LDLCALC, TRIG, CHOLHDL, LDLDIRECT in the last 72 hours. No results for input(s): TSH, T4TOTAL, T3FREE, THYROIDAB in the last 72 hours.  Invalid input(s): FREET3 No results for input(s): VITAMINB12, FOLATE, FERRITIN, TIBC, IRON, RETICCTPCT in the last 72 hours. No results for input(s): LIPASE, AMYLASE in the last 72 hours.  Urine Studies No results for input(s): UHGB, CRYS in the last 72 hours.  Invalid input(s): UACOL, UAPR, USPG, UPH, UTP, UGL, UKET, UBIL, UNIT, UROB, ULEU, UEPI, UWBC, URBC, UBAC, CAST, UCOM, BILUA  MICROBIOLOGY: No results found for this or any previous visit (from the  past 240 hour(s)).  RADIOLOGY STUDIES/RESULTS: Ct Head (brain) Wo Contrast  08/30/2014   CLINICAL DATA:  Right-sided weakness.  EXAM: CT HEAD WITHOUT CONTRAST  TECHNIQUE: Contiguous axial images were obtained from the base of the skull through the vertex without intravenous contrast.  COMPARISON:  None.  FINDINGS: No acute intracranial abnormality. Specifically, no hemorrhage, hydrocephalus, mass lesion, acute infarction, or significant intracranial injury. No acute calvarial abnormality. Visualized paranasal sinuses and mastoids clear. Orbital soft tissues unremarkable.  IMPRESSION: Negative.  Critical Value/emergent results were called by telephone at the time of interpretation on 08/30/2014 at 9:57 pm to Dr. Roseanne RenoStewart , who verbally acknowledged these results.   Electronically Signed   By: Charlett NoseKevin  Dover M.D.   On: 08/30/2014 21:57   Mri Brain Without Contrast  09/01/2014   CLINICAL DATA:  Right upper and lower extremity weakness and aphasia. The extremity symptoms have near completely resolved. The patient continues to  have some speech difficulties.  EXAM: MRI HEAD WITHOUT CONTRAST  MRA HEAD WITHOUT CONTRAST  TECHNIQUE: Multiplanar, multiecho pulse sequences of the brain and surrounding structures were obtained without intravenous contrast. Angiographic images of the head were obtained using MRA technique without contrast.  COMPARISON:  CT head without contrast 08/30/2014.  FINDINGS: MRI HEAD FINDINGS  Scattered foci of restricted diffusion are present within the cortical and subcortical regions of the medial right frontal lobe, following the left ACA distribution. T2 changes are associated with the infarct, cysts compatible with a subacute time frame. No other acute infarcts are present. There is no significant white matter disease. The ventricles are of normal size. No significant extraaxial fluid collection is present.  Flow is present in the major intracranial arteries. The patient is status post bilateral lens replacements. Midline structures are normal. The globes and orbits are otherwise intact. Mild mucosal thickening is present within the ethmoid air cells and sphenoid sinuses bilaterally.  MRA HEAD FINDINGS  The internal carotid arteries are within normal limits from the high cervical segments through the ICA termini bilaterally. The A1 and M1 segments are normal. Anterior communicating artery is patent. The MCA bifurcations are intact bilaterally. The ACA and MCA branch vessels are within normal limits.  The left vertebral artery is the dominant vessel. The right vertebral artery essentially terminates at the PICA sending only a very small branch to the vertebrobasilar junction. The basilar artery is within normal limits. A prominent left posterior communicating artery is present. A smaller right posterior communicating artery is present. P1 segments are evident bilaterally. The PCA branch vessels are within normal limits.  IMPRESSION: 1. Left ACA territory infarcts in the high medial left frontal lobe without  hemorrhage. 2. No focal stenosis or ACA occlusion. 3. No other significant white matter disease or other acute infarcts. These results will be called to the ordering clinician or representative by the Radiologist Assistant, and communication documented in the PACS or zVision Dashboard.   Electronically Signed   By: Gennette Pachris  Mattern M.D.   On: 09/01/2014 10:18   Dg Chest Port 1 View  08/31/2014   CLINICAL DATA:  Tobacco abuse. History of Raynaud's disease, heart murmur, hypercholesterolemia.  EXAM: PORTABLE CHEST - 1 VIEW  COMPARISON:  None.  FINDINGS: Heart size is upper limits normal. Mild perihilar bronchitic changes are identified. Lungs are otherwise clear. No pulmonary edema. Visualized osseous structures have a normal appearance.  IMPRESSION: 1. Mild bronchitic change. 2.  No evidence for acute cardiopulmonary abnormality.   Electronically Signed   By: Rosalie GumsBeth  Brown  M.D.   On: 08/31/2014 09:47   Mr Maxine Glenn Head/brain Wo Cm  09/01/2014   CLINICAL DATA:  Right upper and lower extremity weakness and aphasia. The extremity symptoms have near completely resolved. The patient continues to have some speech difficulties.  EXAM: MRI HEAD WITHOUT CONTRAST  MRA HEAD WITHOUT CONTRAST  TECHNIQUE: Multiplanar, multiecho pulse sequences of the brain and surrounding structures were obtained without intravenous contrast. Angiographic images of the head were obtained using MRA technique without contrast.  COMPARISON:  CT head without contrast 08/30/2014.  FINDINGS: MRI HEAD FINDINGS  Scattered foci of restricted diffusion are present within the cortical and subcortical regions of the medial right frontal lobe, following the left ACA distribution. T2 changes are associated with the infarct, cysts compatible with a subacute time frame. No other acute infarcts are present. There is no significant white matter disease. The ventricles are of normal size. No significant extraaxial fluid collection is present.  Flow is present in the  major intracranial arteries. The patient is status post bilateral lens replacements. Midline structures are normal. The globes and orbits are otherwise intact. Mild mucosal thickening is present within the ethmoid air cells and sphenoid sinuses bilaterally.  MRA HEAD FINDINGS  The internal carotid arteries are within normal limits from the high cervical segments through the ICA termini bilaterally. The A1 and M1 segments are normal. Anterior communicating artery is patent. The MCA bifurcations are intact bilaterally. The ACA and MCA branch vessels are within normal limits.  The left vertebral artery is the dominant vessel. The right vertebral artery essentially terminates at the PICA sending only a very small branch to the vertebrobasilar junction. The basilar artery is within normal limits. A prominent left posterior communicating artery is present. A smaller right posterior communicating artery is present. P1 segments are evident bilaterally. The PCA branch vessels are within normal limits.  IMPRESSION: 1. Left ACA territory infarcts in the high medial left frontal lobe without hemorrhage. 2. No focal stenosis or ACA occlusion. 3. No other significant white matter disease or other acute infarcts. These results will be called to the ordering clinician or representative by the Radiologist Assistant, and communication documented in the PACS or zVision Dashboard.   Electronically Signed   By: Gennette Pac M.D.   On: 09/01/2014 10:18    Jeralyn Bennett, MD  [facility] Pager:[phone]  If 7PM-7AM, please contact night-coverage [email or web address] Password [email or web address] 09/05/2014, 1:28 PM   LOS: 6 days

## 2014-09-05 NOTE — Progress Notes (Signed)
PT Cancellation Note  Patient Details Name: Dawn OvensDenise Perno MRN: [id number] DOB: 1960-11-28   Cancelled Treatment:     Patient just returned from loop recorder placement. Unclear of patients activity status. Will follow up in AM.    Stevenson Windmiller, Adline PotterJulia Elizabeth 09/05/2014, 1:49 PM

## 2014-09-06 ENCOUNTER — Encounter (HOSPITAL_COMMUNITY): Payer: Self-pay | Admitting: Cardiovascular Disease

## 2014-09-06 DIAGNOSIS — I639 Cerebral infarction, unspecified: Secondary | ICD-10-CM | POA: Insufficient documentation

## 2014-09-06 LAB — COMPREHENSIVE METABOLIC PANEL
ALBUMIN: 3.4 g/dL — AB (ref 3.5–5.2)
ALK PHOS: 61 U/L (ref 39–117)
ALT: 24 U/L (ref 0–35)
AST: 24 U/L (ref 0–37)
Anion gap: 12 (ref 5–15)
BUN: 21 mg/dL (ref 6–23)
CHLORIDE: 103 meq/L (ref 96–112)
CO2: 27 mEq/L (ref 19–32)
Calcium: 9 mg/dL (ref 8.4–10.5)
Creatinine, Ser: 0.66 mg/dL (ref 0.50–1.10)
GFR calc Af Amer: >90 mL/min (ref >90)
GFR calc non Af Amer: >90 mL/min (ref >90)
Glucose, Bld: 140 mg/dL — ABNORMAL HIGH (ref 70–99)
Potassium: 4.8 mEq/L (ref 3.7–5.3)
Sodium: 142 mEq/L (ref 137–147)
Total Protein: 6.9 g/dL (ref 6.0–8.3)

## 2014-09-06 LAB — CBC
HEMATOCRIT: 38.9 % (ref 36.0–46.0)
Hemoglobin: 12.9 g/dL (ref 12.0–15.0)
MCH: 30.6 pg (ref 26.0–34.0)
MCHC: 33.2 g/dL (ref 30.0–36.0)
MCV: 92.4 fL (ref 78.0–100.0)
Platelets: 188 10*3/uL (ref 150–400)
RBC: 4.21 MIL/uL (ref 3.87–5.11)
RDW: 12.6 % (ref 11.5–15.5)
WBC: 16.4 10*3/uL — AB (ref 4.0–10.5)

## 2014-09-06 MED ORDER — FAMOTIDINE 20 MG PO TABS
20.0000 mg | ORAL_TABLET | Freq: Two times a day (BID) | ORAL | Status: DC
  Administered 2014-09-06: 20 mg via ORAL
  Filled 2014-09-06: qty 1

## 2014-09-06 MED ORDER — PREDNISONE 20 MG PO TABS
40.0000 mg | ORAL_TABLET | Freq: Two times a day (BID) | ORAL | Status: DC
  Administered 2014-09-06: 40 mg via ORAL
  Filled 2014-09-06: qty 2

## 2014-09-06 MED ORDER — HYDROCORTISONE 1 % EX CREA: TOPICAL_CREAM | Freq: Three times a day (TID) | CUTANEOUS | Status: DC

## 2014-09-06 MED ORDER — DIPHENHYDRAMINE HCL 25 MG PO CAPS: 25.0000 mg | ORAL_CAPSULE | Freq: Four times a day (QID) | ORAL | Status: DC | PRN

## 2014-09-06 MED ORDER — ASPIRIN 325 MG PO TABS: 325.0000 mg | ORAL_TABLET | Freq: Every day | ORAL | Status: DC

## 2014-09-06 MED ORDER — ROSUVASTATIN CALCIUM 20 MG PO TABS: 20.0000 mg | ORAL_TABLET | Freq: Every day | ORAL | Status: DC

## 2014-09-06 MED ORDER — PREDNISONE 20 MG PO TABS: ORAL_TABLET | ORAL | Status: DC

## 2014-09-06 MED ORDER — FAMOTIDINE 20 MG PO TABS: 20.0000 mg | ORAL_TABLET | Freq: Two times a day (BID) | ORAL | Status: DC

## 2014-09-06 NOTE — Progress Notes (Signed)
CARE MANAGEMENT NOTE 09/06/2014  Patient:  Dawn Escobar,Dawn Escobar   Account Number:  [id number]  Date Initiated:  09/04/2014  Documentation initiated by:  [facility]AV[facility],Alesandro Stueve  Subjective/Objective Assessment:   Left ACA territory infarcts in the high medial left frontal lobe     Action/Plan:   lives in extended stay hotel with boyfriend   Anticipated DC Date:  09/05/2014   Anticipated DC Plan:  HOME W HOME HEALTH SERVICES      DC Planning Services  CM consult      [facility]   Choice offered to / List presented to:  C-1 Patient        HH arranged  HH-2 PT      [facility]  [facility]   Status of service:  Completed, signed off Medicare Important Message given?  YES (If response is "NO", the following Medicare IM given date fields will be blank) Date Medicare IM given:  09/04/2014 Medicare IM given by:  [facility]Fortino Haag Date Additional Medicare IM given:  09/06/2014 Additional Medicare IM given by:  Isidoro DonningALESIA Nichols Corter  [facility]position:  [facility]  Per UR Regulation:  Reviewed for med. necessity/level of care/duration of stay  If discussed at Long Length of Stay Meetings, dates discussed:    Comments:  09/06/2014 1200 NCM spoke to pt and provided info on DSS and SSA to contact to arrange to have her [place] switched to [place]. Pt states she gets a disability check on 12/3 for $1200. States [facility] and requested info on other extended stays in [place]. Provided pt info on extended stay hotels and gave [facility] broch[facility] for follow up on 12/7 at 9 am. CSW referral for transportation. Isidoro DonningAlesia Hasani Diemer RN CCM Case Mgmt phone [phone]  09/05/2014 1300 [facility]. Isidoro DonningAlesia Challen Spainhour RN CCM Case Mgmt phone [phone]  09/04/2014 1500 NCM spoke to pt and offered [facility]. She is not from this area. [facility]. Pt states she has cane. Pt states she does not have PCP. Explained she will need to follow up with  Social Security Admin office to have Medicaid switched to [place]. [facility]. [facility] at 9 am for hospital follow up. [facility] cannot come out if pt does not provide a physical address at time of dc. Isidoro DonningAlesia Hevin Jeffcoat RN CCM Case Mgmt phone [phone]

## 2014-09-06 NOTE — Discharge Instructions (Signed)
[address]  [address] [place], [place] [address]  [phone]      DEPARTMENTS OF SOCIAL SERVICES/HEALTH DEPARTMENTS BY [place] [facility]:  [place]: [phone] (main) [address], [place] [address]    Medicaid Transportation: [phone]     Social Security Administration  [address] [place],  [place], [place] [address]  Near the intersection of [facility] and [address]   [phone]      [facility] Extended Stay [address] [place], [place], [place], [address], [place]  5.69 mi /9.16 km Very Good! (114 Reviews)  0"Featured Amenities:[place], [place], Guest 6hotels near  [place] (Results within 50 miles. Search within 100 miles)          [phone] Extended Stay [place] - [place] - [employer]. - [address] [place], [place] [address]  5.5 miles from city center  >> get directions     6hotels near       [place]      (Results within 50 miles. Search within 100 miles) Extended Stay [place] - [employer]. - [address] [place], [place] [address]  5.5 miles from city center  >> get directions     [phone]  Ischemic Stroke A stroke (cerebrovascular accident) is the sudden death of brain tissue. It is a medical emergency. A stroke can cause permanent loss of brain function. This can cause problems with different parts of your body. A transient ischemic attack (TIA) is different because it does not cause permanent damage. A TIA is a short-lived problem of poor blood flow affecting a part of the brain. A TIA is also a serious problem because having a TIA greatly increases the chances of having a stroke. When symptoms first develop, you cannot know if the problem might be a stroke or a TIA. CAUSES  A stroke is caused by a decrease of oxygen supply to an area of your brain. It is usually the result of a small blood clot or collection of cholesterol or fat (plaque) that blocks  blood flow in the brain. A stroke can also be caused by blocked or damaged carotid arteries.  RISK FACTORS  High blood pressure (hypertension).  High cholesterol.  Diabetes mellitus.  Heart disease.  The buildup of plaque in the blood vessels (peripheral artery disease or atherosclerosis).  The buildup of plaque in the blood vessels providing blood and oxygen to the brain (carotid artery stenosis).  An abnormal heart rhythm (atrial fibrillation).  Obesity.  Smoking.  Taking oral contraceptives (especially in combination with smoking).  Physical inactivity.  A diet high in fats, salt (sodium), and calories.  Alcohol use.  Use of illegal drugs (especially cocaine and methamphetamine).  Being African American.  Being over the age of 96.  Family history of stroke.  Previous history of blood clots, stroke, TIA, or heart attack.  Sickle cell disease. SYMPTOMS  These symptoms usually develop suddenly, or may be newly present upon awakening from sleep:  Sudden weakness or numbness of the face, arm, or leg, especially on one side of the body.  Sudden trouble walking or difficulty moving arms or legs.  Sudden confusion.  Sudden personality changes.  Trouble speaking (aphasia) or understanding.  Difficulty swallowing.  Sudden trouble seeing in one or both eyes.  Double vision.  Dizziness.  Loss of balance or coordination.  Sudden severe headache with no known cause.  Trouble reading or writing. DIAGNOSIS  Your health care provider can  often determine the presence or absence of a stroke based on your symptoms, history, and physical exam. Computed tomography (CT) of the brain is usually performed to confirm the stroke, determine causes, and determine stroke severity. Other tests may be done to find the cause of the stroke. These tests may include:  Electrocardiography.  Continuous heart monitoring.  Echocardiography.  Carotid ultrasonography.  Magnetic  resonance imaging (MRI).  A scan of the brain circulation.  Blood tests. PREVENTION  The risk of a stroke can be decreased by appropriately treating high blood pressure, high cholesterol, diabetes, heart disease, and obesity and by quitting smoking, limiting alcohol, and staying physically active. TREATMENT  Time is of the essence. It is important to seek treatment at the first sign of these symptoms because you may receive a medicine to dissolve the clot (thrombolytic) that cannot be given if too much time has passed since your symptoms began. Even if you do not know when your symptoms began, get treatment as soon as possible as there are other treatment options available including oxygen, intravenous (IV) fluids, and medicines to thin the blood (anticoagulants). Treatment of stroke depends on the duration, severity, and cause of your symptoms. Medicines and dietary changes may be used to address diabetes, high blood pressure, and other risk factors. Physical, speech, and occupational therapists will assess you and work with you to improve any functions impaired by the stroke. Measures will be taken to prevent short-term and long-term complications, including infection from breathing foreign material into the lungs (aspiration pneumonia), blood clots in the legs, bedsores, and falls. Rarely, surgery may be needed to remove large blood clots or to open up blocked arteries. HOME CARE INSTRUCTIONS   Take medicines only as directed by your health care provider. Follow the directions carefully. Medicines may be used to control risk factors for a stroke. Be sure you understand all your medicine instructions.  You may be told to take a medicine to thin the blood, such as aspirin or the anticoagulant warfarin. Warfarin needs to be taken exactly as instructed.  Too much and too little warfarin are both dangerous. Too much warfarin increases the risk of bleeding. Too little warfarin continues to allow the risk  for blood clots. While taking warfarin, you will need to have regular blood tests to measure your blood clotting time. These blood tests usually include both the PT and INR tests. The PT and INR results allow your health care provider to adjust your dose of warfarin. The dose can change for many reasons. It is critically important that you take warfarin exactly as prescribed, and that you have your PT and INR levels drawn exactly as directed.  Many foods, especially foods high in vitamin K, can interfere with warfarin and affect the PT and INR results. Foods high in vitamin K include spinach, kale, broccoli, cabbage, collard and turnip greens, brussels sprouts, peas, cauliflower, seaweed, and parsley, as well as beef and pork liver, green tea, and soybean oil. You should eat a consistent amount of foods high in vitamin K. Avoid major changes in your diet, or notify your health care provider before changing your diet. Arrange a visit with a dietitian to answer your questions.  Many medicines can interfere with warfarin and affect the PT and INR results. You must tell your health care provider about any and all medicines you take. This includes all vitamins and supplements. Be especially cautious with aspirin and anti-inflammatory medicines. Do not take or discontinue any prescribed or over-the-counter  medicine except on the advice of your health care provider or pharmacist.  Warfarin can have side effects, such as excessive bruising or bleeding. You will need to hold pressure over cuts for longer than usual. Your health care provider or pharmacist will discuss other potential side effects.  Avoid sports or activities that may cause injury or bleeding.  Be mindful when shaving, flossing your teeth, or handling sharp objects.  Alcohol can change the body's ability to handle warfarin. It is best to avoid alcoholic drinks or consume only very small amounts while taking warfarin. Notify your health care  provider if you change your alcohol intake.  Notify your dentist or other health care providers before procedures.  If swallow studies have determined that your swallowing reflex is present, you should eat healthy foods. Including 5 or more servings of fruits and vegetables a day may reduce the risk of stroke. Foods may need to be a certain consistency (soft or pureed), or small bites may need to be taken in order to avoid aspirating or choking. Certain dietary changes may be advised to address high blood pressure, high cholesterol, diabetes, or obesity.  Food choices that are low in sodium, saturated fat, trans fat, and cholesterol are recommended to manage high blood pressure.  Food choies that are high in fiber, and low in saturated fat, trans fat, and cholesterol may control cholesterol levels.  Controlling carbohydrates and sugar intake is recommended to manage diabetes.  Reducing calorie intake and making food choices that are low in sodium, saturated fat, trans fat, and cholesterol are recommended to manage obesity.  Maintain a healthy weight.  Stay physically active. It is recommended that you get at least 30 minutes of activity on all or most days.  Do not use any tobacco products including cigarettes, chewing tobacco, or electronic cigarettes.  Limit alcohol use even if you are not taking warfarin. Moderate alcohol use is considered to be:  No more than 2 drinks each day for men.  No more than 1 drink each day for nonpregnant women.  Home safety. A safe home environment is important to reduce the risk of falls. Your health care provider may arrange for specialists to evaluate your home. Having grab bars in the bedroom and bathroom is often important. Your health care provider may arrange for equipment to be used at home, such as raised toilets and a seat for the shower.  Physical, occupational, and speech therapy. Ongoing therapy may be needed to maximize your recovery after a  stroke. If you have been advised to use a walker or a cane, use it at all times. Be sure to keep your therapy appointments.  Follow all instructions for follow-up with your health care provider. This is very important. This includes any referrals, physical therapy, rehabilitation, and lab tests. Proper follow-up can prevent another stroke from occurring. SEEK MEDICAL CARE IF:  You have personality changes.  You have difficulty swallowing.  You are seeing double.  You have dizziness.  You have a fever.  You have skin breakdown. SEEK IMMEDIATE MEDICAL CARE IF:  Any of these symptoms may represent a serious problem that is an emergency. Do not wait to see if the symptoms will go away. Get medical help right away. Call your local emergency services (911 in [place] Do not drive yourself to the hospital.  You have sudden weakness or numbness of the face, arm, or leg, especially on one side of the body.  You have sudden trouble walking or difficulty  moving arms or legs.  You have sudden confusion.  You have trouble speaking (aphasia) or understanding.  You have sudden trouble seeing in one or both eyes.  You have a loss of balance or coordination.  You have a sudden, severe headache with no known cause.  You have new chest pain or an irregular heartbeat.  You have a partial or total loss of consciousness. Document Released: 09/22/2005 Document Revised: 02/06/2014 Document Reviewed: 05/02/2012 [facility] Patient Information 2015 [place], [place]. This information is not intended to replace advice given to you by your health care provider. Make sure you discuss any questions you have with your health care provider.   Rash A rash is a change in the color or texture of your skin. There are many different types of rashes. You may have other problems that accompany your rash. CAUSES   Infections.  Allergic reactions. This can include allergies to pets or foods.  Certain  medicines.  Exposure to certain chemicals, soaps, or cosmetics.  Heat.  Exposure to poisonous plants.  Tumors, both cancerous and noncancerous. SYMPTOMS   Redness.  Scaly skin.  Itchy skin.  Dry or cracked skin.  Bumps.  Blisters.  Pain. DIAGNOSIS  Your caregiver may do a physical exam to determine what type of rash you have. A skin sample (biopsy) may be taken and examined under a microscope. TREATMENT  Treatment depends on the type of rash you have. Your caregiver may prescribe certain medicines. For serious conditions, you may need to see a skin doctor (dermatologist). HOME CARE INSTRUCTIONS   Avoid the substance that caused your rash.  Do not scratch your rash. This can cause infection.  You may take cool baths to help stop itching.  Only take over-the-counter or prescription medicines as directed by your caregiver.  Keep all follow-up appointments as directed by your caregiver. SEEK IMMEDIATE MEDICAL CARE IF:  You have increasing pain, swelling, or redness.  You have a fever.  You have new or severe symptoms.  You have body aches, diarrhea, or vomiting.  Your rash is not better after 3 days. MAKE SURE YOU:  Understand these instructions.  Will watch your condition.  Will get help right away if you are not doing well or get worse. Document Released: 09/12/2002 Document Revised: 12/15/2011 Document Reviewed: 07/07/2011 [facility] Patient Information 2015 [place], [place]. This information is not intended to replace advice given to you by your health care provider. Make sure you discuss any questions you have with your health care provider.

## 2014-09-06 NOTE — Discharge Summary (Signed)
[facility]  Physician Discharge Summary   Patient ID: Dawn Escobar MRN: [id number] DOB/AGE: 1961/02/17 53 y.o.  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 Home health PT will be arranged 2. Patient to closely watch her rash.  DISCHARGE CONDITION: fair  Diet recommendation: Heart healthy  Filed Weights   08/31/14 0100  Weight: 67.132 kg (148 lb)    INITIAL HISTORY: Patient is a pleasant 53 year old female with a past medical history of coronary artery disease, Raynaud's disease, tobacco abuse, dyslipidemia, was admitted to the medicine service on 08/31/2014. She recently moved to the area from [place]. She presented to the emergency department with complaints of right-sided weakness. She had not been on aspirin therapy prior to this hospitalization. A CT scan of brain performed on admission did not reveal acute intracranial abnormality. Given that stroke was a concern she was admitted to telemetry placed on stroke protocol with neurology consultation. She was started on aspirin therapy. Follow-up MRI of brain performed on 09/01/2014 revealed left ACA territory infarcts in the high medial frontal lobe without hemorrhage. MRA did not show focal stenosis or ACA occlusion. Transthoracic echocardiogram revealed an ejection fraction of 60-65%. Neurology recommended a transesophageal echocardiogram along with loop recorder implant. TEE performed on 09/05/2014 did not reveal evidence of thrombus. Loop recorder was placed by cardiology. Hospitalization complicated by the development  of a pruritic erythematous rash involving trunk and legs. She was started on oral prednisone 60 mg by mouth daily on 09/04/2014. Rash appeared worse on 09/05/2014 for which she was switched to IV Solu-Medrol and IV Benadryl. This could represent a drug rash, although her only new medications on this hospitalization are ASA and ultram. She reported taking aspirin in the past without any issuers. Ultram was stopped.   Consultations:  Neurology  Procedures: 2-D echocardiogram. Study Conclusions - Left ventricle: The cavity size was normal. Wall thickness wasnormal. Systolic function was normal. The estimated ejectionfraction was in the range of 60% to 65%. Wall motion was normal;there were no regional wall motion abnormalities. Leftventricular diastolic function parameters were normal. - Aortic valve: Sclerosis without stenosis. There was mild centralregurgitation. Regurgitation pressure half-time: 467 ms. - Mitral valve: Mildly thickened leaflets . There was mildregurgitation. - Left atrium: The atrium was normal in size. - Right atrium: The atrium was normal in size. Central venouspressure (est): 8 mm Hg. - Atrial septum: No defect or patent foramen ovale was identified. - Tricuspid valve: There was mild regurgitation. - Pulmonary arteries: PA peak pressure: 33 mm Hg (S). - Inferior vena cava: The vessel was dilated. The respirophasicdiameter changes were blunted (< 50%), consistent with elevatedcentral venous pressure. Impressions: - LVEF 60-65%, normal wall thickness, normal wall motion, normaldiastolic function and chamber sizes, mild TR, top normal RVSPwith elevated RA pressure. Aortic valve sclerosis with mild AIand mitral valve leaflet thickening with mild MR.  Carotid Dopplers. Summary: Bilateral: 1-39% ICA stenosis. Vertebral artery flow is antegrade. Right: Mild ECA stenosis. Left: Mild to moderate ECA stenosis.  Transesophageal echocardiogram Study Conclusions - Left  ventricle: Systolic function was normal. The estimatedejection fraction was in the range of 55% to 60%. Wall motion  wasnormal; there were no regional wall motion abnormalities. - Aortic valve: There was mild regurgitation. - Mitral valve: There was mild regurgitation. - Left atrium: No evidence of thrombus in the atrial cavity orappendage. No evidence of thrombus in the atrial cavity orappendage. - Right atrium: No evidence of thrombus in the atrial cavity orappendage. - Atrial septum: No defect or patent foramen ovale was identified.  HOSPITAL COURSE:   Acute CVA She was admitted with right upper/right lower extremity weakness and aphasia. Symptoms almost resolved, speech still not fluent but almost back to baseline. MRI brain confirms left ACA territory infarct. TTE negative for embolic source. Carotid Doppler negative for significant stenosis. She was started on ASA 325 mg PO q daily. PT/OT recommending outpatient PT.  A TEE performed on 09/05/2014 did not reveal evidence of thrombus. Loop recorder was placed on 09/05/2014. She was placed on a statin. HbA1c 5.6.  Pruritic rash  Patient developed a pruritic erythematous rash involving back, buttock region, posterior aspect of thighs. Given location of rash initially suspected the possibility of contact dermatitis and placed nursing order for hypoallergenic sheets. She was started on oral steroids. The rash then got worse. She was given intravenous steroids and intravenous Benadryl. It was suspected that this is probably drug related. Ultram was discontinued. The rash appears to be somewhat improved today. She'll be sent home on a steroid taper along with Pepcid. She was also prescribed hydrocortisone cream. She will have close follow-up with outpatient providers. She's been told that if she gets fever or the rash worsens significantly, she should seek attention immediately.  Hypercholesterolemia:  Continue crestor. LDL was 46.   Tobacco  abuse Advised to quit.   History of the raynaud's phenomena Resume Doxazosin  History of GERD Continue with PPI   History of anxiety Continue with when necessary clonazepam.  Discussed in detail with the patient and her sister. Case manager has determined that the patient will have a disability check coming in tomorrow. Patient does have a room at an extended living hotel. She will be able to go back there safely. Physical therapy can come to that facility to provide therapy. Explained to the patient to watch her rash closely and to seek attention if it gets worse or if she sees other warning signs such as fever.   PERTINENT LABS:  The results of significant diagnostics from this hospitalization (including imaging, microbiology, ancillary and laboratory) are listed below for reference.      Labs: Basic Metabolic Panel:  Recent Labs Lab 08/30/14 2143 08/30/14 2156 08/31/14 0500 09/06/14 0506  NA 140 141 144 142  K 3.9 3.9 3.8 4.8  CL 104 102 108 103  CO2 24  --  25 27  GLUCOSE 101* 103* 96 140*  BUN 9 8 8 21   CREATININE 0.69 0.80 0.62  0.63 0.66  CALCIUM 8.8  --  8.7 9.0   Liver Function Tests:  Recent Labs Lab 08/30/14 2143 08/31/14 0500 09/06/14 0506  AST 14 12 24   ALT 8 7 24   ALKPHOS 71 65 61  BILITOT 0.2* 0.3 <0.2*  PROT 7.1 6.5 6.9  ALBUMIN 3.7 3.3* 3.4*   CBC:  Recent Labs Lab 08/30/14 2143 08/30/14 2156 08/31/14 0500 09/06/14 0506  WBC 7.4  --  6.4  6.7 16.4*  NEUTROABS 4.9  --  3.6  --   HGB 12.0 12.9 11.8*  11.6* 12.9  HCT 35.7* 38.0 35.0*  34.2* 38.9  MCV 94.7  --  94.3  92.9 92.4  PLT 158  --  162  164 188   CBG:  Recent Labs Lab 09/03/14 1634 09/03/14 2142 09/04/14 0619 09/04/14 1133 09/04/14 1643  GLUCAP 117* 126* 106* 102* 133*     IMAGING STUDIES Ct Head (brain) Wo Contrast  08/30/2014   CLINICAL DATA:  Right-sided weakness.  EXAM: CT HEAD WITHOUT CONTRAST  TECHNIQUE: Contiguous axial images were obtained from the  base of the skull through the vertex without intravenous contrast.  COMPARISON:  None.  FINDINGS: No acute intracranial abnormality. Specifically, no hemorrhage, hydrocephalus, mass lesion, acute infarction, or significant intracranial injury. No acute calvarial abnormality. Visualized paranasal sinuses and mastoids clear. Orbital soft tissues unremarkable.  IMPRESSION: Negative.  Critical Value/emergent results were called by telephone at the time of interpretation on 08/30/2014 at 9:57 pm to Dr. Roseanne Reno , who verbally acknowledged these results.   Electronically Signed   By: Charlett Nose M.D.   On: 08/30/2014 21:57   Mri Brain Without Contrast  09/01/2014   CLINICAL DATA:  Right upper and lower extremity weakness and aphasia. The extremity symptoms have near completely resolved. The patient continues to have some speech difficulties.  EXAM: MRI HEAD WITHOUT CONTRAST  MRA HEAD WITHOUT CONTRAST  TECHNIQUE: Multiplanar, multiecho pulse sequences of the brain and surrounding structures were obtained without intravenous contrast. Angiographic images of the head were obtained using MRA technique without contrast.  COMPARISON:  CT head without contrast 08/30/2014.  FINDINGS: MRI HEAD FINDINGS  Scattered foci of restricted diffusion are present within the cortical and subcortical regions of the medial right frontal lobe, following the left ACA distribution. T2 changes are associated with the infarct, cysts compatible with a subacute time frame. No other acute infarcts are present. There is no significant white matter disease. The ventricles are of normal size. No significant extraaxial fluid collection is present.  Flow is present in the major intracranial arteries. The patient is status post bilateral lens replacements. Midline structures are normal. The globes and orbits are otherwise intact. Mild mucosal thickening is present within the ethmoid air cells and sphenoid sinuses bilaterally.  MRA HEAD FINDINGS  The  internal carotid arteries are within normal limits from the high cervical segments through the ICA termini bilaterally. The A1 and M1 segments are normal. Anterior communicating artery is patent. The MCA bifurcations are intact bilaterally. The ACA and MCA branch vessels are within normal limits.  The left vertebral artery is the dominant vessel. The right vertebral artery essentially terminates at the PICA sending only a very small branch to the vertebrobasilar junction. The basilar artery is within normal limits. A prominent left posterior communicating artery is present. A smaller right posterior communicating artery is present. P1 segments are evident bilaterally. The PCA branch vessels are within normal limits.  IMPRESSION: 1. Left ACA territory infarcts in the high medial left frontal lobe without hemorrhage. 2. No focal stenosis or ACA occlusion. 3. No other significant white matter disease or other acute infarcts. These results will be called to the ordering clinician or representative by the Radiologist Assistant, and communication documented in the PACS or zVision Dashboard.   Electronically Signed   By: Gennette Pac M.D.   On: 09/01/2014 10:18   Dg Chest Port 1 View  08/31/2014   CLINICAL DATA:  Tobacco abuse. History of Raynaud's disease, heart murmur, hypercholesterolemia.  EXAM: PORTABLE CHEST - 1 VIEW  COMPARISON:  None.  FINDINGS: Heart size is upper limits normal. Mild perihilar bronchitic changes are identified. Lungs are otherwise clear. No  pulmonary edema. Visualized osseous structures have a normal appearance.  IMPRESSION: 1. Mild bronchitic change. 2.  No evidence for acute cardiopulmonary abnormality.   Electronically Signed   By: Rosalie GumsBeth  Brown M.D.   On: 08/31/2014 09:47   Mr Maxine GlennMra Head/brain Wo Cm  09/01/2014   CLINICAL DATA:  Right upper and lower extremity weakness and aphasia. The extremity symptoms have near completely resolved. The patient continues to have some speech  difficulties.  EXAM: MRI HEAD WITHOUT CONTRAST  MRA HEAD WITHOUT CONTRAST  TECHNIQUE: Multiplanar, multiecho pulse sequences of the brain and surrounding structures were obtained without intravenous contrast. Angiographic images of the head were obtained using MRA technique without contrast.  COMPARISON:  CT head without contrast 08/30/2014.  FINDINGS: MRI HEAD FINDINGS  Scattered foci of restricted diffusion are present within the cortical and subcortical regions of the medial right frontal lobe, following the left ACA distribution. T2 changes are associated with the infarct, cysts compatible with a subacute time frame. No other acute infarcts are present. There is no significant white matter disease. The ventricles are of normal size. No significant extraaxial fluid collection is present.  Flow is present in the major intracranial arteries. The patient is status post bilateral lens replacements. Midline structures are normal. The globes and orbits are otherwise intact. Mild mucosal thickening is present within the ethmoid air cells and sphenoid sinuses bilaterally.  MRA HEAD FINDINGS  The internal carotid arteries are within normal limits from the high cervical segments through the ICA termini bilaterally. The A1 and M1 segments are normal. Anterior communicating artery is patent. The MCA bifurcations are intact bilaterally. The ACA and MCA branch vessels are within normal limits.  The left vertebral artery is the dominant vessel. The right vertebral artery essentially terminates at the PICA sending only a very small branch to the vertebrobasilar junction. The basilar artery is within normal limits. A prominent left posterior communicating artery is present. A smaller right posterior communicating artery is present. P1 segments are evident bilaterally. The PCA branch vessels are within normal limits.  IMPRESSION: 1. Left ACA territory infarcts in the high medial left frontal lobe without hemorrhage. 2. No focal  stenosis or ACA occlusion. 3. No other significant white matter disease or other acute infarcts. These results will be called to the ordering clinician or representative by the Radiologist Assistant, and communication documented in the PACS or zVision Dashboard.   Electronically Signed   By: Gennette Pachris  Mattern M.D.   On: 09/01/2014 10:18    DISCHARGE EXAMINATION: Filed Vitals:   09/05/14 2147 09/06/14 0121 09/06/14 0602 09/06/14 0950  BP: 142/65 153/75 142/64 144/55  Pulse: 68 72 59 66  Temp: 98 F (36.7 C) 98 F (36.7 C) 98.2 F (36.8 C) 98.6 F (37 C)  TempSrc: Oral Oral Oral Oral  Resp: 20 20 18 18   Height:      Weight:      SpO2: 99% 98% 98% 98%   General appearance: alert, cooperative, appears stated age and no distress Resp: clear to auscultation bilaterally Cardio: regular rate and rhythm, S1, S2 normal, no murmur, click, rub or gallop GI: soft, non-tender; bowel sounds normal; no masses,  no organomegaly Skin: Erythematous maculopapular rash appreciated over her back, buttocks, lower abdomen  DISPOSITION: To her hotel  Discharge Instructions    Ambulatory referral to Neurology    Complete by:  As directed   Follow-up appointment for Dr. Pearlean BrownieSethi in one month.     Diet - low sodium heart healthy  Complete by:  As directed      Discharge instructions    Complete by:  As directed   Please seek attention if rash doesn't improve in 5 days or gets worse.     Increase activity slowly    Complete by:  As directed            ALLERGIES:  Allergies  Allergen Reactions  . Morphine And Related Hives  . Tape Rash    Current Discharge Medication List    START taking these medications   Details  aspirin 325 MG tablet Take 1 tablet (325 mg total) by mouth daily. Qty: 30 tablet, Refills: 3    diphenhydrAMINE (BENADRYL) 25 mg capsule Take 1 capsule (25 mg total) by mouth every 6 (six) hours as needed for itching. Qty: 15 capsule, Refills: 0    famotidine (PEPCID) 20 MG  tablet Take 1 tablet (20 mg total) by mouth 2 (two) times daily. Qty: 20 tablet, Refills: 0    hydrocortisone cream 1 % Apply topically 3 (three) times daily. Qty: 30 g, Refills: 0    predniSONE (DELTASONE) 20 MG tablet 2 tablets twice daily for 3 days, then take 2 tablets once daily for 4 days, then 1 tablet once daily for 4 days, then STOP. Qty: 24 tablet, Refills: 0      CONTINUE these medications which have CHANGED   Details  rosuvastatin (CRESTOR) 20 MG tablet Take 1 tablet (20 mg total) by mouth at bedtime. Qty: 30 tablet, Refills: 2      CONTINUE these medications which have NOT CHANGED   Details  clonazePAM (KLONOPIN) 0.5 MG tablet Take 0.5 mg by mouth 3 (three) times daily as needed for anxiety.    cyclobenzaprine (FLEXERIL) 5 MG tablet Take 5 mg by mouth 3 (three) times daily as needed for muscle spasms.    doxazosin (CARDURA) 8 MG tablet Take 12 mg by mouth at bedtime.     esomeprazole (NEXIUM) 40 MG capsule Take 40 mg by mouth daily.    gabapentin (NEURONTIN) 800 MG tablet Take 800 mg by mouth 3 (three) times daily.    guaiFENesin (MUCINEX) 600 MG 12 hr tablet Take 600 mg by mouth 2 (two) times daily as needed for cough.    traZODone (DESYREL) 100 MG tablet Take 100 mg by mouth at bedtime as needed for sleep.    vitamin C (ASCORBIC ACID) 500 MG tablet Take 500 mg by mouth daily.       Follow-up Information    Follow up with [facility].   Why:  Home Health Physical Therapy (agency will not be able to come out without a physical address)   Contact information:   [address] [place] [address] [phone]       Follow up with SETHI,PRAMOD, MD In 1 month.   Specialties:  Neurology, Radiology   Why:  stroke clinic, office will call you for follow up appointment, call earlier if symptoms worsen or new neurologica   Contact information:   [address] [place] [address] [phone]       Follow up with [facility]    .   Why:  appt on 09/11/2014 at 9 am , please bring discharge instruction to your appointment.   Contact information:   [address] [place] [phone]      TOTAL DISCHARGE TIME: 35 mins  [facility] Center[facility]ISHNAN,Bonner Larue  [facility] Pager [phone]  09/06/2014, 12:15 PM

## 2014-09-06 NOTE — Progress Notes (Signed)
Discharge orders received. Pt for discharge home today. IV d/c'd. Dressing on chest has old drainage. Pt given discharge instructions and prescriptions with verbalized understanding. Family aware of discharge to hotel. Staff brought pt downstairs via wheelchair. Pt given a cab voucher.

## 2014-09-06 NOTE — Progress Notes (Addendum)
CARE MANAGEMENT NOTE 09/06/2014  Patient:  Dawn Escobar,Dawn Escobar   Account Number:  [id number]  Date Initiated:  09/04/2014  Documentation initiated by:  [facility]  Subjective/Objective Assessment:   Left ACA territory infarcts in the high medial left frontal lobe     Action/Plan:   lives in extended stay hotel with boyfriend   Anticipated DC Date:  09/05/2014   Anticipated DC Plan:  HOME W HOME HEALTH SERVICES      DC Planning Services  CM consult      [facility]   Choice offered to / List presented to:  C-1 Patient        HH arranged  HH-2 PT      [facility]  [facility]   Status of service:  Completed, signed off Medicare Important Message given?  YES (If response is "NO", the following Medicare IM given date fields will be blank) Date Medicare IM given:  09/04/2014 Medicare IM given by:  [facility],Anda Sobotta Date Additional Medicare IM given:  09/06/2014 Additional Medicare IM given by:  Isidoro DonningALESIA Talesha Ellithorpe  Discharge Disposition:  [facility]  Per UR Regulation:  Reviewed for med. necessity/level of care/duration of stay  If discussed at Long Length of Stay Meetings, dates discussed:    Comments:  09/06/2014 1320 NCM spoke to pt's sister, Yehuda MaoJeanine # [phone]. Sister really [place] pt move back to [place]. She has offered her sister a place to live and financial assistance. NCM spoke to pt and conveyed her sister's wishes to assist her if she moves back to [place]. Pt states she plans to look for a one bedroom apt [place]. [facility] [address], [place], [place] [address]  [address]  Notified [facility] of scheduled dc home with HH. Isidoro DonningAlesia Anihya Tuma RN CCM Case Mgmt phone [phone]   09/06/2014 1200 NCM spoke to pt and provided info on DSS and SSA to contact to arrange to have her [place] Medicaid switched to [place]. Pt states she gets a disability check on 12/3 for $1200. [facility] and requested info on other  extended stays in [place]. Provided pt info on extended stay hotels and gave [facility] brochure for follow up on 12/7 at 9 am. CSW referral for transportation. Isidoro DonningAlesia Collie Kittel RN CCM Case Mgmt phone [phone]  09/05/2014 1300 [facility]. Isidoro DonningAlesia Brileigh Sevcik RN CCM Case Mgmt phone [phone]  09/04/2014 1500 NCM spoke to pt and offered [facility] for [facility]. She is not from this area. Agreeable to [facility]. Pt states she has cane. Pt states she does not have PCP. Explained she will need to follow up with Social Security Admin office to have Medicaid switched to [place]. [facility]. [facility]2 at 9 am for hospital follow up. [facility] cannot come out if pt does not provide a physical address at time of dc. Isidoro DonningAlesia Jaiel Saraceno RN CCM Case Mgmt phone [phone]

## 2014-09-07 DIAGNOSIS — I10 Essential (primary) hypertension: Secondary | ICD-10-CM | POA: Diagnosis not present

## 2014-09-07 DIAGNOSIS — F339 Major depressive disorder, recurrent, unspecified: Secondary | ICD-10-CM | POA: Diagnosis not present

## 2014-09-07 DIAGNOSIS — I69920 Aphasia following unspecified cerebrovascular disease: Secondary | ICD-10-CM | POA: Diagnosis not present

## 2014-09-07 DIAGNOSIS — R1310 Dysphagia, unspecified: Secondary | ICD-10-CM | POA: Diagnosis not present

## 2014-09-07 DIAGNOSIS — I2510 Atherosclerotic heart disease of native coronary artery without angina pectoris: Secondary | ICD-10-CM | POA: Diagnosis not present

## 2014-09-07 DIAGNOSIS — K219 Gastro-esophageal reflux disease without esophagitis: Secondary | ICD-10-CM | POA: Diagnosis not present

## 2014-09-07 DIAGNOSIS — M2550 Pain in unspecified joint: Secondary | ICD-10-CM | POA: Diagnosis not present

## 2014-09-07 DIAGNOSIS — Z720 Tobacco use: Secondary | ICD-10-CM | POA: Diagnosis not present

## 2014-09-07 DIAGNOSIS — I69891 Dysphagia following other cerebrovascular disease: Secondary | ICD-10-CM | POA: Diagnosis not present

## 2014-09-07 DIAGNOSIS — F4312 Post-traumatic stress disorder, chronic: Secondary | ICD-10-CM | POA: Diagnosis not present

## 2014-09-07 DIAGNOSIS — I6340 Cerebral infarction due to embolism of unspecified cerebral artery: Secondary | ICD-10-CM | POA: Diagnosis not present

## 2014-09-07 DIAGNOSIS — I69953 Hemiplegia and hemiparesis following unspecified cerebrovascular disease affecting right non-dominant side: Secondary | ICD-10-CM | POA: Diagnosis not present

## 2014-09-07 DIAGNOSIS — F419 Anxiety disorder, unspecified: Secondary | ICD-10-CM | POA: Diagnosis not present

## 2014-09-07 DIAGNOSIS — L299 Pruritus, unspecified: Secondary | ICD-10-CM | POA: Diagnosis not present

## 2014-09-07 DIAGNOSIS — I7300 Raynaud's syndrome without gangrene: Secondary | ICD-10-CM | POA: Diagnosis not present

## 2014-09-07 NOTE — Progress Notes (Signed)
09/07/2014 0900 Appt confirmed with [facility] for 12/7 at 9 am. Isidoro DonningAlesia Ahjanae Cassel RN CCM Case Mgmt phone [phone]

## 2014-09-11 DIAGNOSIS — I69953 Hemiplegia and hemiparesis following unspecified cerebrovascular disease affecting right non-dominant side: Secondary | ICD-10-CM | POA: Diagnosis not present

## 2014-09-11 DIAGNOSIS — I10 Essential (primary) hypertension: Secondary | ICD-10-CM | POA: Diagnosis not present

## 2014-09-11 DIAGNOSIS — L299 Pruritus, unspecified: Secondary | ICD-10-CM | POA: Diagnosis not present

## 2014-09-11 DIAGNOSIS — I2510 Atherosclerotic heart disease of native coronary artery without angina pectoris: Secondary | ICD-10-CM | POA: Diagnosis not present

## 2014-09-11 DIAGNOSIS — I69920 Aphasia following unspecified cerebrovascular disease: Secondary | ICD-10-CM | POA: Diagnosis not present

## 2014-09-11 DIAGNOSIS — I69891 Dysphagia following other cerebrovascular disease: Secondary | ICD-10-CM | POA: Diagnosis not present

## 2014-09-12 DIAGNOSIS — I69953 Hemiplegia and hemiparesis following unspecified cerebrovascular disease affecting right non-dominant side: Secondary | ICD-10-CM | POA: Diagnosis not present

## 2014-09-12 DIAGNOSIS — I2510 Atherosclerotic heart disease of native coronary artery without angina pectoris: Secondary | ICD-10-CM | POA: Diagnosis not present

## 2014-09-12 DIAGNOSIS — L299 Pruritus, unspecified: Secondary | ICD-10-CM | POA: Diagnosis not present

## 2014-09-12 DIAGNOSIS — I69891 Dysphagia following other cerebrovascular disease: Secondary | ICD-10-CM | POA: Diagnosis not present

## 2014-09-12 DIAGNOSIS — I10 Essential (primary) hypertension: Secondary | ICD-10-CM | POA: Diagnosis not present

## 2014-09-12 DIAGNOSIS — I69920 Aphasia following unspecified cerebrovascular disease: Secondary | ICD-10-CM | POA: Diagnosis not present

## 2014-09-13 DIAGNOSIS — I69891 Dysphagia following other cerebrovascular disease: Secondary | ICD-10-CM | POA: Diagnosis not present

## 2014-09-13 DIAGNOSIS — I10 Essential (primary) hypertension: Secondary | ICD-10-CM | POA: Diagnosis not present

## 2014-09-13 DIAGNOSIS — I69953 Hemiplegia and hemiparesis following unspecified cerebrovascular disease affecting right non-dominant side: Secondary | ICD-10-CM | POA: Diagnosis not present

## 2014-09-13 DIAGNOSIS — I69920 Aphasia following unspecified cerebrovascular disease: Secondary | ICD-10-CM | POA: Diagnosis not present

## 2014-09-13 DIAGNOSIS — I2510 Atherosclerotic heart disease of native coronary artery without angina pectoris: Secondary | ICD-10-CM | POA: Diagnosis not present

## 2014-09-13 DIAGNOSIS — L299 Pruritus, unspecified: Secondary | ICD-10-CM | POA: Diagnosis not present

## 2014-09-14 ENCOUNTER — Encounter (HOSPITAL_COMMUNITY): Payer: Self-pay | Admitting: Internal Medicine

## 2014-09-14 DIAGNOSIS — I69953 Hemiplegia and hemiparesis following unspecified cerebrovascular disease affecting right non-dominant side: Secondary | ICD-10-CM | POA: Diagnosis not present

## 2014-09-14 DIAGNOSIS — I2510 Atherosclerotic heart disease of native coronary artery without angina pectoris: Secondary | ICD-10-CM | POA: Diagnosis not present

## 2014-09-14 DIAGNOSIS — I69920 Aphasia following unspecified cerebrovascular disease: Secondary | ICD-10-CM | POA: Diagnosis not present

## 2014-09-14 DIAGNOSIS — L299 Pruritus, unspecified: Secondary | ICD-10-CM | POA: Diagnosis not present

## 2014-09-14 DIAGNOSIS — I10 Essential (primary) hypertension: Secondary | ICD-10-CM | POA: Diagnosis not present

## 2014-09-14 DIAGNOSIS — I69891 Dysphagia following other cerebrovascular disease: Secondary | ICD-10-CM | POA: Diagnosis not present

## 2014-09-15 DIAGNOSIS — I10 Essential (primary) hypertension: Secondary | ICD-10-CM | POA: Diagnosis not present

## 2014-09-15 DIAGNOSIS — I69920 Aphasia following unspecified cerebrovascular disease: Secondary | ICD-10-CM | POA: Diagnosis not present

## 2014-09-15 DIAGNOSIS — I69891 Dysphagia following other cerebrovascular disease: Secondary | ICD-10-CM | POA: Diagnosis not present

## 2014-09-15 DIAGNOSIS — I69953 Hemiplegia and hemiparesis following unspecified cerebrovascular disease affecting right non-dominant side: Secondary | ICD-10-CM | POA: Diagnosis not present

## 2014-09-15 DIAGNOSIS — L299 Pruritus, unspecified: Secondary | ICD-10-CM | POA: Diagnosis not present

## 2014-09-15 DIAGNOSIS — I2510 Atherosclerotic heart disease of native coronary artery without angina pectoris: Secondary | ICD-10-CM | POA: Diagnosis not present

## 2014-09-18 ENCOUNTER — Ambulatory Visit (INDEPENDENT_AMBULATORY_CARE_PROVIDER_SITE_OTHER): Payer: Medicare Other | Admitting: *Deleted

## 2014-09-18 ENCOUNTER — Telehealth: Payer: Self-pay | Admitting: Cardiology

## 2014-09-18 ENCOUNTER — Ambulatory Visit: Payer: Medicare Other

## 2014-09-18 DIAGNOSIS — I2510 Atherosclerotic heart disease of native coronary artery without angina pectoris: Secondary | ICD-10-CM | POA: Diagnosis not present

## 2014-09-18 DIAGNOSIS — I639 Cerebral infarction, unspecified: Secondary | ICD-10-CM

## 2014-09-18 DIAGNOSIS — L299 Pruritus, unspecified: Secondary | ICD-10-CM | POA: Diagnosis not present

## 2014-09-18 DIAGNOSIS — I69953 Hemiplegia and hemiparesis following unspecified cerebrovascular disease affecting right non-dominant side: Secondary | ICD-10-CM | POA: Diagnosis not present

## 2014-09-18 DIAGNOSIS — I69920 Aphasia following unspecified cerebrovascular disease: Secondary | ICD-10-CM | POA: Diagnosis not present

## 2014-09-18 DIAGNOSIS — I10 Essential (primary) hypertension: Secondary | ICD-10-CM | POA: Diagnosis not present

## 2014-09-18 DIAGNOSIS — I69891 Dysphagia following other cerebrovascular disease: Secondary | ICD-10-CM | POA: Diagnosis not present

## 2014-09-18 LAB — MDC_IDC_ENUM_SESS_TYPE_INCLINIC
Date Time Interrogation Session: 20151214153717
MDC IDC SET ZONE DETECTION INTERVAL: 340 ms
Zone Setting Detection Interval: 2000 ms
Zone Setting Detection Interval: 3000 ms

## 2014-09-18 NOTE — Telephone Encounter (Signed)
Pt called w/ SN for home monitor [id number]. Informed pt that I would call [employer] and have the SN put in the system and then call her back and instruct her how to send a manual transmission. Pt verbalized understanding.

## 2014-09-18 NOTE — Progress Notes (Signed)
Wound check in clinic s/p ILR implant. Wound well healed without redness or edema.  Pt with 0 tachy episodes; 0 brady episodes; 0 asystole; 0 AF episodes; 0 symptom episodes. Plan to [facility] F/U QMO and F/U w/JA PRN.

## 2014-09-19 DIAGNOSIS — I69920 Aphasia following unspecified cerebrovascular disease: Secondary | ICD-10-CM | POA: Diagnosis not present

## 2014-09-19 DIAGNOSIS — I69953 Hemiplegia and hemiparesis following unspecified cerebrovascular disease affecting right non-dominant side: Secondary | ICD-10-CM | POA: Diagnosis not present

## 2014-09-19 DIAGNOSIS — I10 Essential (primary) hypertension: Secondary | ICD-10-CM | POA: Diagnosis not present

## 2014-09-19 DIAGNOSIS — L299 Pruritus, unspecified: Secondary | ICD-10-CM | POA: Diagnosis not present

## 2014-09-19 DIAGNOSIS — I69891 Dysphagia following other cerebrovascular disease: Secondary | ICD-10-CM | POA: Diagnosis not present

## 2014-09-19 DIAGNOSIS — I2510 Atherosclerotic heart disease of native coronary artery without angina pectoris: Secondary | ICD-10-CM | POA: Diagnosis not present

## 2014-09-19 NOTE — Telephone Encounter (Signed)
Called [employer] and had SN placed in pt profile. Called pt to walk her thorough the transmission. She was not at home and will call me back tomorrow so we can do this.

## 2014-09-20 ENCOUNTER — Telehealth: Payer: Self-pay | Admitting: Cardiology

## 2014-09-20 DIAGNOSIS — I69920 Aphasia following unspecified cerebrovascular disease: Secondary | ICD-10-CM | POA: Diagnosis not present

## 2014-09-20 DIAGNOSIS — I10 Essential (primary) hypertension: Secondary | ICD-10-CM | POA: Diagnosis not present

## 2014-09-20 DIAGNOSIS — I69953 Hemiplegia and hemiparesis following unspecified cerebrovascular disease affecting right non-dominant side: Secondary | ICD-10-CM | POA: Diagnosis not present

## 2014-09-20 DIAGNOSIS — L299 Pruritus, unspecified: Secondary | ICD-10-CM | POA: Diagnosis not present

## 2014-09-20 DIAGNOSIS — I2510 Atherosclerotic heart disease of native coronary artery without angina pectoris: Secondary | ICD-10-CM | POA: Diagnosis not present

## 2014-09-20 DIAGNOSIS — I69891 Dysphagia following other cerebrovascular disease: Secondary | ICD-10-CM | POA: Diagnosis not present

## 2014-09-20 NOTE — Telephone Encounter (Signed)
Walked pt through a manual transmission.

## 2014-09-21 DIAGNOSIS — M542 Cervicalgia: Secondary | ICD-10-CM | POA: Diagnosis not present

## 2014-09-21 DIAGNOSIS — G479 Sleep disorder, unspecified: Secondary | ICD-10-CM | POA: Diagnosis not present

## 2014-09-21 DIAGNOSIS — I10 Essential (primary) hypertension: Secondary | ICD-10-CM | POA: Diagnosis not present

## 2014-09-21 DIAGNOSIS — M549 Dorsalgia, unspecified: Secondary | ICD-10-CM | POA: Diagnosis not present

## 2014-09-21 DIAGNOSIS — I69920 Aphasia following unspecified cerebrovascular disease: Secondary | ICD-10-CM | POA: Diagnosis not present

## 2014-09-21 DIAGNOSIS — F339 Major depressive disorder, recurrent, unspecified: Secondary | ICD-10-CM | POA: Diagnosis not present

## 2014-09-21 DIAGNOSIS — I69953 Hemiplegia and hemiparesis following unspecified cerebrovascular disease affecting right non-dominant side: Secondary | ICD-10-CM | POA: Diagnosis not present

## 2014-09-21 DIAGNOSIS — I2510 Atherosclerotic heart disease of native coronary artery without angina pectoris: Secondary | ICD-10-CM | POA: Diagnosis not present

## 2014-09-21 DIAGNOSIS — I6340 Cerebral infarction due to embolism of unspecified cerebral artery: Secondary | ICD-10-CM | POA: Diagnosis not present

## 2014-09-21 DIAGNOSIS — R51 Headache: Secondary | ICD-10-CM | POA: Diagnosis not present

## 2014-09-21 DIAGNOSIS — L299 Pruritus, unspecified: Secondary | ICD-10-CM | POA: Diagnosis not present

## 2014-09-21 DIAGNOSIS — I69891 Dysphagia following other cerebrovascular disease: Secondary | ICD-10-CM | POA: Diagnosis not present

## 2014-09-22 ENCOUNTER — Other Ambulatory Visit (HOSPITAL_BASED_OUTPATIENT_CLINIC_OR_DEPARTMENT_OTHER): Payer: Self-pay | Admitting: Family Medicine

## 2014-09-22 ENCOUNTER — Ambulatory Visit (HOSPITAL_BASED_OUTPATIENT_CLINIC_OR_DEPARTMENT_OTHER)
Admission: RE | Admit: 2014-09-22 | Discharge: 2014-09-22 | Disposition: A | Discharge status: Home / Self Care | Payer: Medicare Other | Source: Ambulatory Visit | Attending: Family Medicine | Admitting: Family Medicine

## 2014-09-22 DIAGNOSIS — M549 Dorsalgia, unspecified: Secondary | ICD-10-CM

## 2014-09-22 DIAGNOSIS — Z8673 Personal history of transient ischemic attack (TIA), and cerebral infarction without residual deficits: Secondary | ICD-10-CM | POA: Insufficient documentation

## 2014-09-22 DIAGNOSIS — M542 Cervicalgia: Secondary | ICD-10-CM

## 2014-09-22 DIAGNOSIS — M8938 Hypertrophy of bone, other site: Secondary | ICD-10-CM | POA: Diagnosis not present

## 2014-09-22 DIAGNOSIS — I10 Essential (primary) hypertension: Secondary | ICD-10-CM | POA: Diagnosis not present

## 2014-09-22 DIAGNOSIS — M5410 Radiculopathy, site unspecified: Secondary | ICD-10-CM | POA: Insufficient documentation

## 2014-09-22 DIAGNOSIS — M47894 Other spondylosis, thoracic region: Secondary | ICD-10-CM | POA: Diagnosis not present

## 2014-09-22 DIAGNOSIS — M5030 Other cervical disc degeneration, unspecified cervical region: Secondary | ICD-10-CM | POA: Diagnosis not present

## 2014-09-22 DIAGNOSIS — I2510 Atherosclerotic heart disease of native coronary artery without angina pectoris: Secondary | ICD-10-CM | POA: Diagnosis not present

## 2014-09-22 DIAGNOSIS — M47814 Spondylosis without myelopathy or radiculopathy, thoracic region: Secondary | ICD-10-CM | POA: Diagnosis not present

## 2014-09-22 DIAGNOSIS — I69891 Dysphagia following other cerebrovascular disease: Secondary | ICD-10-CM | POA: Diagnosis not present

## 2014-09-22 DIAGNOSIS — M546 Pain in thoracic spine: Secondary | ICD-10-CM | POA: Diagnosis not present

## 2014-09-22 DIAGNOSIS — I69953 Hemiplegia and hemiparesis following unspecified cerebrovascular disease affecting right non-dominant side: Secondary | ICD-10-CM | POA: Diagnosis not present

## 2014-09-22 DIAGNOSIS — L299 Pruritus, unspecified: Secondary | ICD-10-CM | POA: Diagnosis not present

## 2014-09-22 DIAGNOSIS — M5032 Other cervical disc degeneration, mid-cervical region: Secondary | ICD-10-CM | POA: Diagnosis not present

## 2014-09-22 DIAGNOSIS — I69920 Aphasia following unspecified cerebrovascular disease: Secondary | ICD-10-CM | POA: Diagnosis not present

## 2014-09-24 DIAGNOSIS — I69953 Hemiplegia and hemiparesis following unspecified cerebrovascular disease affecting right non-dominant side: Secondary | ICD-10-CM | POA: Diagnosis not present

## 2014-09-24 DIAGNOSIS — I69891 Dysphagia following other cerebrovascular disease: Secondary | ICD-10-CM | POA: Diagnosis not present

## 2014-09-24 DIAGNOSIS — I10 Essential (primary) hypertension: Secondary | ICD-10-CM | POA: Diagnosis not present

## 2014-09-24 DIAGNOSIS — L299 Pruritus, unspecified: Secondary | ICD-10-CM | POA: Diagnosis not present

## 2014-09-24 DIAGNOSIS — I69920 Aphasia following unspecified cerebrovascular disease: Secondary | ICD-10-CM | POA: Diagnosis not present

## 2014-09-24 DIAGNOSIS — I2510 Atherosclerotic heart disease of native coronary artery without angina pectoris: Secondary | ICD-10-CM | POA: Diagnosis not present

## 2014-09-25 ENCOUNTER — Other Ambulatory Visit: Payer: Self-pay | Admitting: Gastroenterology

## 2014-09-25 DIAGNOSIS — R1310 Dysphagia, unspecified: Secondary | ICD-10-CM

## 2014-09-25 DIAGNOSIS — R634 Abnormal weight loss: Secondary | ICD-10-CM | POA: Diagnosis not present

## 2014-09-25 DIAGNOSIS — I639 Cerebral infarction, unspecified: Secondary | ICD-10-CM | POA: Diagnosis not present

## 2014-09-25 DIAGNOSIS — K5900 Constipation, unspecified: Secondary | ICD-10-CM | POA: Diagnosis not present

## 2014-09-25 DIAGNOSIS — Z800 Family history of malignant neoplasm of digestive organs: Secondary | ICD-10-CM | POA: Diagnosis not present

## 2014-09-26 ENCOUNTER — Other Ambulatory Visit: Payer: Medicare Other

## 2014-09-26 DIAGNOSIS — I69953 Hemiplegia and hemiparesis following unspecified cerebrovascular disease affecting right non-dominant side: Secondary | ICD-10-CM | POA: Diagnosis not present

## 2014-09-26 DIAGNOSIS — L299 Pruritus, unspecified: Secondary | ICD-10-CM | POA: Diagnosis not present

## 2014-09-26 DIAGNOSIS — I69891 Dysphagia following other cerebrovascular disease: Secondary | ICD-10-CM | POA: Diagnosis not present

## 2014-09-26 DIAGNOSIS — I69920 Aphasia following unspecified cerebrovascular disease: Secondary | ICD-10-CM | POA: Diagnosis not present

## 2014-09-26 DIAGNOSIS — I2510 Atherosclerotic heart disease of native coronary artery without angina pectoris: Secondary | ICD-10-CM | POA: Diagnosis not present

## 2014-09-26 DIAGNOSIS — I10 Essential (primary) hypertension: Secondary | ICD-10-CM | POA: Diagnosis not present

## 2014-09-27 DIAGNOSIS — I10 Essential (primary) hypertension: Secondary | ICD-10-CM | POA: Diagnosis not present

## 2014-09-27 DIAGNOSIS — I2510 Atherosclerotic heart disease of native coronary artery without angina pectoris: Secondary | ICD-10-CM | POA: Diagnosis not present

## 2014-09-27 DIAGNOSIS — I69953 Hemiplegia and hemiparesis following unspecified cerebrovascular disease affecting right non-dominant side: Secondary | ICD-10-CM | POA: Diagnosis not present

## 2014-09-27 DIAGNOSIS — I69920 Aphasia following unspecified cerebrovascular disease: Secondary | ICD-10-CM | POA: Diagnosis not present

## 2014-09-27 DIAGNOSIS — I69891 Dysphagia following other cerebrovascular disease: Secondary | ICD-10-CM | POA: Diagnosis not present

## 2014-09-27 DIAGNOSIS — L299 Pruritus, unspecified: Secondary | ICD-10-CM | POA: Diagnosis not present

## 2014-10-02 ENCOUNTER — Encounter: Payer: Self-pay | Admitting: Internal Medicine

## 2014-10-02 DIAGNOSIS — M542 Cervicalgia: Secondary | ICD-10-CM | POA: Diagnosis not present

## 2014-10-03 DIAGNOSIS — I69891 Dysphagia following other cerebrovascular disease: Secondary | ICD-10-CM | POA: Diagnosis not present

## 2014-10-03 DIAGNOSIS — I10 Essential (primary) hypertension: Secondary | ICD-10-CM | POA: Diagnosis not present

## 2014-10-03 DIAGNOSIS — I69953 Hemiplegia and hemiparesis following unspecified cerebrovascular disease affecting right non-dominant side: Secondary | ICD-10-CM | POA: Diagnosis not present

## 2014-10-03 DIAGNOSIS — L299 Pruritus, unspecified: Secondary | ICD-10-CM | POA: Diagnosis not present

## 2014-10-03 DIAGNOSIS — I2510 Atherosclerotic heart disease of native coronary artery without angina pectoris: Secondary | ICD-10-CM | POA: Diagnosis not present

## 2014-10-03 DIAGNOSIS — I69920 Aphasia following unspecified cerebrovascular disease: Secondary | ICD-10-CM | POA: Diagnosis not present

## 2014-10-04 DIAGNOSIS — M542 Cervicalgia: Secondary | ICD-10-CM | POA: Diagnosis not present

## 2014-10-05 DIAGNOSIS — I10 Essential (primary) hypertension: Secondary | ICD-10-CM | POA: Diagnosis not present

## 2014-10-05 DIAGNOSIS — L299 Pruritus, unspecified: Secondary | ICD-10-CM | POA: Diagnosis not present

## 2014-10-05 DIAGNOSIS — I69891 Dysphagia following other cerebrovascular disease: Secondary | ICD-10-CM | POA: Diagnosis not present

## 2014-10-05 DIAGNOSIS — I69920 Aphasia following unspecified cerebrovascular disease: Secondary | ICD-10-CM | POA: Diagnosis not present

## 2014-10-05 DIAGNOSIS — I2510 Atherosclerotic heart disease of native coronary artery without angina pectoris: Secondary | ICD-10-CM | POA: Diagnosis not present

## 2014-10-05 DIAGNOSIS — F419 Anxiety disorder, unspecified: Secondary | ICD-10-CM | POA: Diagnosis not present

## 2014-10-05 DIAGNOSIS — I69953 Hemiplegia and hemiparesis following unspecified cerebrovascular disease affecting right non-dominant side: Secondary | ICD-10-CM | POA: Diagnosis not present

## 2014-10-08 ENCOUNTER — Encounter (HOSPITAL_COMMUNITY): Payer: Self-pay | Admitting: *Deleted

## 2014-10-09 DIAGNOSIS — M5412 Radiculopathy, cervical region: Secondary | ICD-10-CM | POA: Diagnosis not present

## 2014-10-10 ENCOUNTER — Other Ambulatory Visit: Payer: Medicare Other

## 2014-10-11 ENCOUNTER — Telehealth: Payer: Self-pay | Admitting: *Deleted

## 2014-10-11 ENCOUNTER — Other Ambulatory Visit: Payer: Self-pay | Admitting: *Deleted

## 2014-10-11 ENCOUNTER — Telehealth: Payer: Self-pay | Admitting: Internal Medicine

## 2014-10-11 DIAGNOSIS — I6340 Cerebral infarction due to embolism of unspecified cerebral artery: Secondary | ICD-10-CM

## 2014-10-11 NOTE — Telephone Encounter (Signed)
GT reviewed EGM from 10-08-14 on 10-10-14. Per GT start Eliquis 5 mg bid. Spoke with pt and had concerns about starting medication. Pt has appointment on 10-13-14 with JA. Pt had several questions and wanted to see if wait until appointment with JA. Spoke w/JA to get opinion on 10-10-14 in the evening. JA prefers pt to start Eliquis ASAP.  Spoke w/pt on 10-11-14 around 915 am to let know JA wanted pt to start ASAP. [facility] [address] [facility] [place]. Pt was instructed to keep appointment with JA. Pt has some concerns with started on anticoagulation due to family history of problems with bleeding.

## 2014-10-11 NOTE — Telephone Encounter (Signed)
New Msg        Pt states she spoke with someone earlier who was prescribing a new medication but she is not sure what it was and where the prescription was sent.    Please call.

## 2014-10-12 DIAGNOSIS — I69891 Dysphagia following other cerebrovascular disease: Secondary | ICD-10-CM | POA: Diagnosis not present

## 2014-10-12 DIAGNOSIS — I10 Essential (primary) hypertension: Secondary | ICD-10-CM | POA: Diagnosis not present

## 2014-10-12 DIAGNOSIS — I69953 Hemiplegia and hemiparesis following unspecified cerebrovascular disease affecting right non-dominant side: Secondary | ICD-10-CM | POA: Diagnosis not present

## 2014-10-12 DIAGNOSIS — I69920 Aphasia following unspecified cerebrovascular disease: Secondary | ICD-10-CM | POA: Diagnosis not present

## 2014-10-12 DIAGNOSIS — I2510 Atherosclerotic heart disease of native coronary artery without angina pectoris: Secondary | ICD-10-CM | POA: Diagnosis not present

## 2014-10-12 DIAGNOSIS — L299 Pruritus, unspecified: Secondary | ICD-10-CM | POA: Diagnosis not present

## 2014-10-12 MED ORDER — APIXABAN 5 MG PO TABS: 5.0000 mg | ORAL_TABLET | Freq: Two times a day (BID) | ORAL | Status: DC

## 2014-10-12 NOTE — Telephone Encounter (Signed)
Confirmed Rx w/Kristin Corliss BlackerMcNeill. Rx sent to pharmacy. Patient notified.

## 2014-10-12 NOTE — Telephone Encounter (Signed)
10-12-14 confirmed appt with pt, says the rx was for a blood thinner  and wants it called to [facility] on [address]  [place]

## 2014-10-13 ENCOUNTER — Ambulatory Visit (INDEPENDENT_AMBULATORY_CARE_PROVIDER_SITE_OTHER): Payer: Medicare Other | Admitting: Internal Medicine

## 2014-10-13 ENCOUNTER — Encounter: Payer: Self-pay | Admitting: Internal Medicine

## 2014-10-13 VITALS — BP 112/60 | HR 86 | Ht 65.0 in | Wt 159.0 lb

## 2014-10-13 DIAGNOSIS — Z720 Tobacco use: Secondary | ICD-10-CM | POA: Diagnosis not present

## 2014-10-13 DIAGNOSIS — I480 Paroxysmal atrial fibrillation: Secondary | ICD-10-CM

## 2014-10-13 DIAGNOSIS — I4891 Unspecified atrial fibrillation: Secondary | ICD-10-CM

## 2014-10-13 DIAGNOSIS — I6340 Cerebral infarction due to embolism of unspecified cerebral artery: Secondary | ICD-10-CM | POA: Diagnosis not present

## 2014-10-13 LAB — MDC_IDC_ENUM_SESS_TYPE_INCLINIC

## 2014-10-13 MED ORDER — APIXABAN 5 MG PO TABS: 5.0000 mg | ORAL_TABLET | Freq: Two times a day (BID) | ORAL | Status: DC

## 2014-10-13 NOTE — Patient Instructions (Addendum)
Your physician recommends that you schedule a follow-up appointment next available  with Dr. Pearlean BrownieSethi  Your physician recommends that you schedule a follow-up appointment in: 4 weeks with Rivka Saferonna Carroll,NP  Your physician has recommended you make the following change in your medication:  1) Stop Aspirin 2) Start Eliquis 5mg  twice daily

## 2014-10-14 ENCOUNTER — Encounter: Payer: Self-pay | Admitting: Internal Medicine

## 2014-10-14 DIAGNOSIS — I4891 Unspecified atrial fibrillation: Secondary | ICD-10-CM | POA: Insufficient documentation

## 2014-10-14 NOTE — Progress Notes (Signed)
PCP: No PCP Per Patient Neurologist:  Dr Cindie LarocheSethi  Dawn Escobar is a 54 y.o. female who presents today for electrophysiology followup.  Her LINQ has documented atrial fibrillation.  She was asymptomatic with this.  She was contacted by our office and prescribed eliquis.  Unfortunately, she did not start this medicine.   Today, she denies symptoms of palpitations, chest pain, shortness of breath,  lower extremity edema, dizziness, presyncope, or syncope.  The patient is otherwise without complaint today.   Past Medical History  Diagnosis Date  . PTSD (post-traumatic stress disorder)   . Raynaud's disease   . Anxiety   . Hypercholesteremia   . Stroke    Past Surgical History  Procedure Laterality Date  . Wrist surgery    . Abdominal hysterectomy    . Knee surgery    . Hip surgery  Right  . Tee without cardioversion N/A 09/05/2014    Procedure: TRANSESOPHAGEAL ECHOCARDIOGRAM (TEE);  Surgeon: Wendall StadePeter C Nishan, MD;  [facility];  Service: Cardiovascular;  Laterality: N/A;  loop after  . Loop recorder implant N/A 09/05/2014    MDT LINQ implanted by Dr Johney FrameAllred for cryptogenic stroke    ROS- all systems are reviewed and negatives except as per HPI above  Current Outpatient Prescriptions  Medication Sig Dispense Refill  . amitriptyline (ELAVIL) 25 MG tablet Take 1 tablet by mouth daily.  0  . cyclobenzaprine (FLEXERIL) 5 MG tablet Take 5 mg by mouth 3 (three) times daily as needed for muscle spasms.    Marland Kitchen. doxazosin (CARDURA) 8 MG tablet Take 12 mg by mouth at bedtime.     Marland Kitchen. esomeprazole (NEXIUM) 40 MG capsule Take 40 mg by mouth daily.    Marland Kitchen. FLUoxetine (PROZAC) 10 MG capsule Take 1 capsule by mouth daily.  0  . gabapentin (NEURONTIN) 800 MG tablet Take 800 mg by mouth 3 (three) times daily.    Marland Kitchen. HYDROcodone-acetaminophen (NORCO/VICODIN) 5-325 MG per tablet Take by mouth every 4 (four) hours as needed. 1-2 tabs every 4 hrs as needed for pain  0  . methocarbamol (ROBAXIN) 750 MG tablet  Take 1 tablet by mouth daily.  1  . rosuvastatin (CRESTOR) 20 MG tablet Take 1 tablet (20 mg total) by mouth at bedtime. 30 tablet 2  . traZODone (DESYREL) 100 MG tablet Take 100 mg by mouth at bedtime as needed for sleep.    Marland Kitchen. apixaban (ELIQUIS) 5 MG TABS tablet Take 1 tablet (5 mg total) by mouth 2 (two) times daily. 60 tablet 11   No current facility-administered medications for this visit.    Physical Exam: Filed Vitals:   10/13/14 1529  BP: 112/60  Pulse: 86  Height: 5\' 5"  (1.651 m)  Weight: 159 lb (72.122 kg)    GEN- The patient is well appearing, alert and oriented x 3 today.   Head- normocephalic, atraumatic Eyes-  Sclera clear, conjunctiva pink Ears- hearing intact Oropharynx- clear Lungs- Clear to ausculation bilaterally, normal work of breathing Heart- Regular rate and rhythm, no murmurs, rubs or gallops, PMI not laterally displaced GI- soft, NT, ND, + BS Extremities- no clubbing, cyanosis, or edema  Implantable loop recorder is interrogated and confirms an episode of atrial fibrillation with elevated V rates  Assessment and Plan:  1. afib This is a new diagnosis I spoke extensively to the patient about the diagnosis and also her stroke risks.  Her chads2vasc score is at least 3.  The importance of compliance with anticoagulation was discussed today.  I have again prescribed eliquis  BID today.  Stop ASA. I have also encouraged her to follow-up with Dr Pearlean Brownie  2. H/o stroke As above  3. Tobacco Cessation is advised  Follow-up with Rudi Coco NP in 4 weeks for further AF management

## 2014-10-16 ENCOUNTER — Encounter: Payer: Self-pay | Admitting: Internal Medicine

## 2014-10-17 ENCOUNTER — Other Ambulatory Visit: Payer: Medicare Other

## 2014-10-18 DIAGNOSIS — I2510 Atherosclerotic heart disease of native coronary artery without angina pectoris: Secondary | ICD-10-CM | POA: Diagnosis not present

## 2014-10-18 DIAGNOSIS — I69920 Aphasia following unspecified cerebrovascular disease: Secondary | ICD-10-CM | POA: Diagnosis not present

## 2014-10-18 DIAGNOSIS — I69953 Hemiplegia and hemiparesis following unspecified cerebrovascular disease affecting right non-dominant side: Secondary | ICD-10-CM | POA: Diagnosis not present

## 2014-10-18 DIAGNOSIS — I10 Essential (primary) hypertension: Secondary | ICD-10-CM | POA: Diagnosis not present

## 2014-10-18 DIAGNOSIS — I69891 Dysphagia following other cerebrovascular disease: Secondary | ICD-10-CM | POA: Diagnosis not present

## 2014-10-18 DIAGNOSIS — L299 Pruritus, unspecified: Secondary | ICD-10-CM | POA: Diagnosis not present

## 2014-10-20 ENCOUNTER — Ambulatory Visit (INDEPENDENT_AMBULATORY_CARE_PROVIDER_SITE_OTHER): Payer: Medicare Other | Admitting: *Deleted

## 2014-10-20 DIAGNOSIS — I6340 Cerebral infarction due to embolism of unspecified cerebral artery: Secondary | ICD-10-CM

## 2014-10-20 NOTE — Progress Notes (Signed)
Loop recorder 

## 2014-10-23 DIAGNOSIS — M5412 Radiculopathy, cervical region: Secondary | ICD-10-CM | POA: Diagnosis not present

## 2014-10-25 DIAGNOSIS — L299 Pruritus, unspecified: Secondary | ICD-10-CM | POA: Diagnosis not present

## 2014-10-25 DIAGNOSIS — I69920 Aphasia following unspecified cerebrovascular disease: Secondary | ICD-10-CM | POA: Diagnosis not present

## 2014-10-25 DIAGNOSIS — I69891 Dysphagia following other cerebrovascular disease: Secondary | ICD-10-CM | POA: Diagnosis not present

## 2014-10-25 DIAGNOSIS — I10 Essential (primary) hypertension: Secondary | ICD-10-CM | POA: Diagnosis not present

## 2014-10-25 DIAGNOSIS — I2510 Atherosclerotic heart disease of native coronary artery without angina pectoris: Secondary | ICD-10-CM | POA: Diagnosis not present

## 2014-10-25 DIAGNOSIS — I69953 Hemiplegia and hemiparesis following unspecified cerebrovascular disease affecting right non-dominant side: Secondary | ICD-10-CM | POA: Diagnosis not present

## 2014-10-26 ENCOUNTER — Encounter: Payer: Self-pay | Admitting: Internal Medicine

## 2014-11-01 DIAGNOSIS — I69891 Dysphagia following other cerebrovascular disease: Secondary | ICD-10-CM | POA: Diagnosis not present

## 2014-11-01 DIAGNOSIS — I69953 Hemiplegia and hemiparesis following unspecified cerebrovascular disease affecting right non-dominant side: Secondary | ICD-10-CM | POA: Diagnosis not present

## 2014-11-01 DIAGNOSIS — I69920 Aphasia following unspecified cerebrovascular disease: Secondary | ICD-10-CM | POA: Diagnosis not present

## 2014-11-01 DIAGNOSIS — I2510 Atherosclerotic heart disease of native coronary artery without angina pectoris: Secondary | ICD-10-CM | POA: Diagnosis not present

## 2014-11-01 DIAGNOSIS — I10 Essential (primary) hypertension: Secondary | ICD-10-CM | POA: Diagnosis not present

## 2014-11-01 DIAGNOSIS — L299 Pruritus, unspecified: Secondary | ICD-10-CM | POA: Diagnosis not present

## 2014-11-01 LAB — MDC_IDC_ENUM_SESS_TYPE_REMOTE
MDC IDC SESS DTM: 20160127052829
Zone Setting Detection Interval: 2000 ms
Zone Setting Detection Interval: 3000 ms
Zone Setting Detection Interval: 340 ms

## 2014-11-10 ENCOUNTER — Ambulatory Visit: Payer: Medicare Other | Admitting: Nurse Practitioner

## 2014-11-20 ENCOUNTER — Ambulatory Visit (INDEPENDENT_AMBULATORY_CARE_PROVIDER_SITE_OTHER): Payer: Medicare Other | Admitting: *Deleted

## 2014-11-20 DIAGNOSIS — I6340 Cerebral infarction due to embolism of unspecified cerebral artery: Secondary | ICD-10-CM | POA: Diagnosis not present

## 2014-11-22 NOTE — Progress Notes (Signed)
Loop recorder 

## 2014-11-29 LAB — MDC_IDC_ENUM_SESS_TYPE_REMOTE

## 2014-11-30 ENCOUNTER — Encounter: Payer: Self-pay | Admitting: Internal Medicine

## 2014-12-08 ENCOUNTER — Encounter: Payer: Self-pay | Admitting: Internal Medicine

## 2014-12-19 ENCOUNTER — Ambulatory Visit (INDEPENDENT_AMBULATORY_CARE_PROVIDER_SITE_OTHER): Payer: Medicare Other | Admitting: *Deleted

## 2014-12-19 DIAGNOSIS — I6340 Cerebral infarction due to embolism of unspecified cerebral artery: Secondary | ICD-10-CM

## 2014-12-19 LAB — MDC_IDC_ENUM_SESS_TYPE_REMOTE

## 2014-12-21 NOTE — Progress Notes (Signed)
Loop recorder 

## 2015-01-12 ENCOUNTER — Encounter: Payer: Self-pay | Admitting: Internal Medicine

## 2015-01-16 ENCOUNTER — Ambulatory Visit: Payer: Self-pay | Admitting: Neurology

## 2015-01-17 ENCOUNTER — Encounter: Payer: Self-pay | Admitting: Neurology

## 2015-01-18 ENCOUNTER — Ambulatory Visit (INDEPENDENT_AMBULATORY_CARE_PROVIDER_SITE_OTHER): Payer: Medicare Other | Admitting: *Deleted

## 2015-01-18 DIAGNOSIS — I6340 Cerebral infarction due to embolism of unspecified cerebral artery: Secondary | ICD-10-CM

## 2015-01-19 NOTE — Progress Notes (Signed)
Loop recorder 

## 2015-02-16 ENCOUNTER — Ambulatory Visit (INDEPENDENT_AMBULATORY_CARE_PROVIDER_SITE_OTHER): Payer: Medicare Other | Admitting: *Deleted

## 2015-02-16 DIAGNOSIS — I6340 Cerebral infarction due to embolism of unspecified cerebral artery: Secondary | ICD-10-CM | POA: Diagnosis not present

## 2015-02-20 LAB — CUP PACEART REMOTE DEVICE CHECK: Date Time Interrogation Session: 20160517143511

## 2015-02-23 NOTE — Progress Notes (Signed)
Loop recorder 

## 2015-03-07 LAB — CUP PACEART REMOTE DEVICE CHECK: Date Time Interrogation Session: 20160522040500

## 2015-03-08 ENCOUNTER — Encounter: Payer: Self-pay | Admitting: Internal Medicine

## 2015-03-19 ENCOUNTER — Encounter: Payer: Self-pay | Admitting: Internal Medicine

## 2015-03-19 ENCOUNTER — Ambulatory Visit (INDEPENDENT_AMBULATORY_CARE_PROVIDER_SITE_OTHER): Payer: Medicare Other | Admitting: *Deleted

## 2015-03-19 DIAGNOSIS — I6340 Cerebral infarction due to embolism of unspecified cerebral artery: Secondary | ICD-10-CM

## 2015-03-20 LAB — CUP PACEART REMOTE DEVICE CHECK: Date Time Interrogation Session: 20160614101608

## 2015-03-20 NOTE — Progress Notes (Signed)
Loop recorder 

## 2015-03-23 ENCOUNTER — Encounter (HOSPITAL_COMMUNITY): Payer: Self-pay | Admitting: Nurse Practitioner

## 2015-03-23 ENCOUNTER — Encounter: Payer: Self-pay | Admitting: Internal Medicine

## 2015-04-13 ENCOUNTER — Telehealth: Payer: Self-pay | Admitting: Internal Medicine

## 2015-04-16 ENCOUNTER — Encounter: Payer: Self-pay | Admitting: Internal Medicine

## 2015-04-16 ENCOUNTER — Ambulatory Visit (INDEPENDENT_AMBULATORY_CARE_PROVIDER_SITE_OTHER): Payer: Medicare Other | Admitting: Internal Medicine

## 2015-04-16 ENCOUNTER — Telehealth: Payer: Self-pay

## 2015-04-16 VITALS — BP 132/70 | HR 73 | Ht 65.0 in | Wt 177.6 lb

## 2015-04-16 DIAGNOSIS — I6340 Cerebral infarction due to embolism of unspecified cerebral artery: Secondary | ICD-10-CM

## 2015-04-16 DIAGNOSIS — Z7189 Other specified counseling: Secondary | ICD-10-CM

## 2015-04-16 DIAGNOSIS — I480 Paroxysmal atrial fibrillation: Secondary | ICD-10-CM

## 2015-04-16 DIAGNOSIS — Z7689 Persons encountering health services in other specified circumstances: Secondary | ICD-10-CM

## 2015-04-16 LAB — CUP PACEART INCLINIC DEVICE CHECK
Date Time Interrogation Session: 20160711170149
Zone Setting Detection Interval: 2000 ms
Zone Setting Detection Interval: 3000 ms
Zone Setting Detection Interval: 340 ms

## 2015-04-16 LAB — CBC WITH DIFFERENTIAL/PLATELET
BASOS ABS: 0 10*3/uL (ref 0.0–0.1)
Basophils Relative: 0.5 % (ref 0.0–3.0)
EOS ABS: 0.2 10*3/uL (ref 0.0–0.7)
Eosinophils Relative: 2.2 % (ref 0.0–5.0)
HEMATOCRIT: 40.1 % (ref 36.0–46.0)
HEMOGLOBIN: 13.7 g/dL (ref 12.0–15.0)
LYMPHS ABS: 1.9 10*3/uL (ref 0.7–4.0)
Lymphocytes Relative: 25.7 % (ref 12.0–46.0)
MCHC: 34.3 g/dL (ref 30.0–36.0)
MCV: 89.8 fl (ref 78.0–100.0)
Monocytes Absolute: 0.5 10*3/uL (ref 0.1–1.0)
Monocytes Relative: 6.6 % (ref 3.0–12.0)
Neutro Abs: 4.9 10*3/uL (ref 1.4–7.7)
Neutrophils Relative %: 65 % (ref 43.0–77.0)
PLATELETS: 189 10*3/uL (ref 150.0–400.0)
RBC: 4.46 Mil/uL (ref 3.87–5.11)
RDW: 13.2 % (ref 11.5–15.5)
WBC: 7.6 10*3/uL (ref 4.0–10.5)

## 2015-04-16 LAB — BASIC METABOLIC PANEL
BUN: 8 mg/dL (ref 6–23)
CALCIUM: 9.3 mg/dL (ref 8.4–10.5)
CO2: 28 meq/L (ref 19–32)
Chloride: 105 mEq/L (ref 96–112)
Creatinine, Ser: 0.8 mg/dL (ref 0.40–1.20)
GFR: 79.68 mL/min (ref >60.00)
Glucose, Bld: 95 mg/dL (ref 70–99)
Potassium: 4.3 mEq/L (ref 3.5–5.1)
SODIUM: 139 meq/L (ref 135–145)

## 2015-04-16 NOTE — Telephone Encounter (Signed)
Eliquis 5 mg samples, 4 boxes given to patient, along with paperwork for LIS program.

## 2015-04-16 NOTE — Patient Instructions (Signed)
Medication Instructions: - no changes  Labwork: - Your physician recommends that you have lab work today: BMP/ CBC  Procedures/Testing: - none  Follow-Up: - Your physician wants you to follow-up in: 6 months with Dawn BalsamAmber Seiler, NP for Dr. Johney Escobar. You will receive a reminder letter in the mail two months in advance. If you don't receive a letter, please call our office to schedule the follow-up appointment.  - It is recommended that you establish care with a Primary Care doctor Dawn Escobar([facility])  - Your physician recommends that you schedule a follow-up appointment with : Neurology- Dr. Pearlean BrownieSethi ([facility])  Any Additional Special Instructions Will Be Listed Below (If Applicable).

## 2015-04-16 NOTE — Progress Notes (Signed)
PCP: No PCP Per Patient Neurologist:  Dr Cindie LarocheSethi  Dawn Escobar is a 54 y.o. female who presents today for electrophysiology followup. She has not kept follow-up with primary care, afib clinic, or neurology as I have advised previously.  She says she is taking eliquis but no other medicines.  She has started smoking again.  Today, she denies symptoms of palpitations, exertional chest pain,  lower extremity edema, dizziness, presyncope, or syncope.  She has occasional SOB which appears stable.   The patient is otherwise without complaint today.   Past Medical History  Diagnosis Date  . PTSD (post-traumatic stress disorder)   . Raynaud's disease   . Anxiety   . Hypercholesteremia   . Stroke    Past Surgical History  Procedure Laterality Date  . Wrist surgery    . Abdominal hysterectomy    . Knee surgery    . Hip surgery  Right  . Tee without cardioversion N/A 09/05/2014    Procedure: TRANSESOPHAGEAL ECHOCARDIOGRAM (TEE);  Surgeon: Wendall StadePeter C Nishan, MD;  [facility];  Service: Cardiovascular;  Laterality: N/A;  loop after  . Loop recorder implant N/A 09/05/2014    MDT LINQ implanted by Dr Johney FrameAllred for cryptogenic stroke    ROS- all systems are reviewed and negatives except as per HPI above  Current Outpatient Prescriptions  Medication Sig Dispense Refill  . apixaban (ELIQUIS) 5 MG TABS tablet Take 1 tablet (5 mg total) by mouth 2 (two) times daily. 60 tablet 11  . amitriptyline (ELAVIL) 25 MG tablet Take 1 tablet by mouth daily.  0  . clonazePAM (KLONOPIN) 0.5 MG tablet Take 0.5 mg by mouth 3 (three) times daily as needed for anxiety.    . cyclobenzaprine (FLEXERIL) 5 MG tablet Take 5 mg by mouth 3 (three) times daily as needed for muscle spasms.    Marland Kitchen. doxazosin (CARDURA) 8 MG tablet Take 12 mg by mouth at bedtime.     Marland Kitchen. esomeprazole (NEXIUM) 40 MG capsule Take 40 mg by mouth daily.    Marland Kitchen. gabapentin (NEURONTIN) 800 MG tablet Take 800 mg by mouth 3 (three) times daily.    Marland Kitchen.  HYDROcodone-acetaminophen (NORCO/VICODIN) 5-325 MG per tablet Take 1-2 tablets by mouth every 4 (four) hours as needed (pain).   0  . methocarbamol (ROBAXIN) 750 MG tablet Take 1 tablet by mouth daily.  1  . rosuvastatin (CRESTOR) 20 MG tablet Take 1 tablet (20 mg total) by mouth at bedtime. (Patient not taking: Reported on 04/16/2015) 30 tablet 2  . traZODone (DESYREL) 100 MG tablet Take 100 mg by mouth at bedtime as needed for sleep.     No current facility-administered medications for this visit.    Physical Exam: Filed Vitals:   04/16/15 1402  BP: 132/70  Pulse: 73  Height: 5\' 5"  (1.651 m)  Weight: 80.559 kg (177 lb 9.6 oz)    GEN- The patient is well appearing, alert and oriented x 3 today.   Head- normocephalic, atraumatic Eyes-  Sclera clear, conjunctiva pink Ears- hearing intact Oropharynx- clear Lungs- Clear to ausculation bilaterally, normal work of breathing Heart- Regular rate and rhythm, no murmurs, rubs or gallops, PMI not laterally displaced GI- soft, NT, ND, + BS Extremities- no clubbing, cyanosis, or edema  Implantable loop recorder is interrogated and reveals short atrial flutter with 2:1 conduction  Assessment and Plan:  1. Afib/ atrial flutter Stable Check cbc, bmet on eliquis,  Compliance advised I have also encouraged her to follow-up with Dr Pearlean BrownieSethi  2. H/o stroke As above  3. Tobacco Cessation is advised  Follow-up with EP APP

## 2015-04-18 ENCOUNTER — Ambulatory Visit (INDEPENDENT_AMBULATORY_CARE_PROVIDER_SITE_OTHER): Payer: Medicare Other | Admitting: *Deleted

## 2015-04-18 DIAGNOSIS — I6340 Cerebral infarction due to embolism of unspecified cerebral artery: Secondary | ICD-10-CM

## 2015-04-19 NOTE — Progress Notes (Signed)
Loop recorder 

## 2015-04-24 ENCOUNTER — Telehealth: Payer: Self-pay | Admitting: Behavioral Health

## 2015-04-24 ENCOUNTER — Encounter: Payer: Self-pay | Admitting: Behavioral Health

## 2015-04-24 NOTE — Addendum Note (Signed)
Addended by: Melanee SpryBYRD, RONECIA E on: 04/24/2015 03:53 PM   Modules accepted: Medications

## 2015-04-24 NOTE — Telephone Encounter (Signed)
Pre-Visit Call completed with patient and chart updated.   Pre-Visit Info documented in Specialty Comments under SnapShot.    

## 2015-04-24 NOTE — Telephone Encounter (Signed)
Unable to reach patient at time of Pre-Visit Call.  Left message for patient to return call when available.    

## 2015-04-25 ENCOUNTER — Encounter: Payer: Self-pay | Admitting: Medical

## 2015-04-25 ENCOUNTER — Emergency Department (HOSPITAL_BASED_OUTPATIENT_CLINIC_OR_DEPARTMENT_OTHER): Payer: Medicare Other

## 2015-04-25 ENCOUNTER — Telehealth: Payer: Self-pay | Admitting: Neurology

## 2015-04-25 ENCOUNTER — Other Ambulatory Visit: Payer: Self-pay

## 2015-04-25 ENCOUNTER — Encounter (HOSPITAL_BASED_OUTPATIENT_CLINIC_OR_DEPARTMENT_OTHER): Payer: Self-pay | Admitting: *Deleted

## 2015-04-25 ENCOUNTER — Emergency Department (HOSPITAL_BASED_OUTPATIENT_CLINIC_OR_DEPARTMENT_OTHER)
Admission: EM | Admit: 2015-04-25 | Discharge: 2015-04-25 | Disposition: A | Discharge status: Home / Self Care | Payer: Medicare Other | Attending: Emergency Medicine | Admitting: Emergency Medicine

## 2015-04-25 ENCOUNTER — Ambulatory Visit (INDEPENDENT_AMBULATORY_CARE_PROVIDER_SITE_OTHER): Payer: Medicare Other | Admitting: Medical

## 2015-04-25 VITALS — BP 122/57 | HR 95 | Temp 98.1°F | Ht 65.0 in | Wt 177.0 lb

## 2015-04-25 DIAGNOSIS — Z7902 Long term (current) use of antithrombotics/antiplatelets: Secondary | ICD-10-CM | POA: Diagnosis not present

## 2015-04-25 DIAGNOSIS — G609 Hereditary and idiopathic neuropathy, unspecified: Secondary | ICD-10-CM

## 2015-04-25 DIAGNOSIS — R253 Fasciculation: Secondary | ICD-10-CM | POA: Diagnosis not present

## 2015-04-25 DIAGNOSIS — R42 Dizziness and giddiness: Secondary | ICD-10-CM | POA: Diagnosis not present

## 2015-04-25 DIAGNOSIS — M542 Cervicalgia: Secondary | ICD-10-CM

## 2015-04-25 DIAGNOSIS — R531 Weakness: Secondary | ICD-10-CM | POA: Diagnosis not present

## 2015-04-25 DIAGNOSIS — I7300 Raynaud's syndrome without gangrene: Secondary | ICD-10-CM | POA: Diagnosis not present

## 2015-04-25 DIAGNOSIS — T148XXA Other injury of unspecified body region, initial encounter: Secondary | ICD-10-CM | POA: Insufficient documentation

## 2015-04-25 DIAGNOSIS — F411 Generalized anxiety disorder: Secondary | ICD-10-CM

## 2015-04-25 DIAGNOSIS — R519 Headache, unspecified: Secondary | ICD-10-CM

## 2015-04-25 DIAGNOSIS — K219 Gastro-esophageal reflux disease without esophagitis: Secondary | ICD-10-CM | POA: Diagnosis not present

## 2015-04-25 DIAGNOSIS — Z8679 Personal history of other diseases of the circulatory system: Secondary | ICD-10-CM | POA: Diagnosis not present

## 2015-04-25 DIAGNOSIS — Z8659 Personal history of other mental and behavioral disorders: Secondary | ICD-10-CM | POA: Diagnosis not present

## 2015-04-25 DIAGNOSIS — M79604 Pain in right leg: Secondary | ICD-10-CM | POA: Insufficient documentation

## 2015-04-25 DIAGNOSIS — Z8673 Personal history of transient ischemic attack (TIA), and cerebral infarction without residual deficits: Secondary | ICD-10-CM | POA: Insufficient documentation

## 2015-04-25 DIAGNOSIS — Z720 Tobacco use: Secondary | ICD-10-CM | POA: Diagnosis not present

## 2015-04-25 DIAGNOSIS — H538 Other visual disturbances: Secondary | ICD-10-CM | POA: Diagnosis not present

## 2015-04-25 DIAGNOSIS — I6340 Cerebral infarction due to embolism of unspecified cerebral artery: Secondary | ICD-10-CM | POA: Diagnosis not present

## 2015-04-25 DIAGNOSIS — E780 Pure hypercholesterolemia: Secondary | ICD-10-CM | POA: Diagnosis not present

## 2015-04-25 DIAGNOSIS — R251 Tremor, unspecified: Secondary | ICD-10-CM | POA: Insufficient documentation

## 2015-04-25 DIAGNOSIS — G629 Polyneuropathy, unspecified: Secondary | ICD-10-CM

## 2015-04-25 DIAGNOSIS — R51 Headache: Secondary | ICD-10-CM | POA: Diagnosis present

## 2015-04-25 DIAGNOSIS — Z79899 Other long term (current) drug therapy: Secondary | ICD-10-CM | POA: Diagnosis not present

## 2015-04-25 HISTORY — DX: Hereditary and idiopathic neuropathy, unspecified: G60.9

## 2015-04-25 HISTORY — DX: Gastro-esophageal reflux disease without esophagitis: K21.9

## 2015-04-25 LAB — CBC WITH DIFFERENTIAL/PLATELET
BASOS ABS: 0 10*3/uL (ref 0.0–0.1)
Basophils Relative: 1 % (ref 0–1)
Eosinophils Absolute: 0.1 10*3/uL (ref 0.0–0.7)
Eosinophils Relative: 1 % (ref 0–5)
HCT: 40.3 % (ref 36.0–46.0)
Hemoglobin: 13.7 g/dL (ref 12.0–15.0)
LYMPHS ABS: 1.5 10*3/uL (ref 0.7–4.0)
Lymphocytes Relative: 17 % (ref 12–46)
MCH: 30.8 pg (ref 26.0–34.0)
MCHC: 34 g/dL (ref 30.0–36.0)
MCV: 90.6 fL (ref 78.0–100.0)
Monocytes Absolute: 0.4 10*3/uL (ref 0.1–1.0)
Monocytes Relative: 5 % (ref 3–12)
NEUTROS ABS: 6.7 10*3/uL (ref 1.7–7.7)
Neutrophils Relative %: 76 % (ref 43–77)
PLATELETS: 175 10*3/uL (ref 150–400)
RBC: 4.45 MIL/uL (ref 3.87–5.11)
RDW: 12.6 % (ref 11.5–15.5)
WBC: 8.7 10*3/uL (ref 4.0–10.5)

## 2015-04-25 LAB — BASIC METABOLIC PANEL
Anion gap: 6 (ref 5–15)
BUN: 9 mg/dL (ref 6–20)
CHLORIDE: 103 mmol/L (ref 101–111)
CO2: 29 mmol/L (ref 22–32)
CREATININE: 0.94 mg/dL (ref 0.44–1.00)
Calcium: 9.3 mg/dL (ref 8.9–10.3)
Glucose, Bld: 108 mg/dL — ABNORMAL HIGH (ref 65–99)
POTASSIUM: 4.6 mmol/L (ref 3.5–5.1)
SODIUM: 138 mmol/L (ref 135–145)

## 2015-04-25 MED ORDER — CLONAZEPAM 0.5 MG PO TABS: 0.5000 mg | ORAL_TABLET | Freq: Two times a day (BID) | ORAL | Status: DC | PRN

## 2015-04-25 MED ORDER — TRAZODONE HCL 100 MG PO TABS: 100.0000 mg | ORAL_TABLET | Freq: Every day | ORAL | Status: DC

## 2015-04-25 MED ORDER — METHOCARBAMOL 500 MG PO TABS: ORAL_TABLET | ORAL | Status: DC

## 2015-04-25 MED ORDER — ESOMEPRAZOLE MAGNESIUM 40 MG PO CPDR: 40.0000 mg | DELAYED_RELEASE_CAPSULE | Freq: Every day | ORAL | Status: DC

## 2015-04-25 MED ORDER — GABAPENTIN 100 MG PO CAPS: 100.0000 mg | ORAL_CAPSULE | Freq: Three times a day (TID) | ORAL | Status: DC

## 2015-04-25 MED ORDER — AMLODIPINE BESYLATE 5 MG PO TABS: 5.0000 mg | ORAL_TABLET | Freq: Every day | ORAL | Status: DC

## 2015-04-25 NOTE — Assessment & Plan Note (Signed)
nexium 40 mg q day. Prior rx she used

## 2015-04-25 NOTE — Assessment & Plan Note (Addendum)
Will get cbc today to assess platelets.

## 2015-04-25 NOTE — ED Provider Notes (Addendum)
CSN: [id number]     Arrival date & time 04/25/15  1509 History   First MD Initiated Contact with Patient 04/25/15 1516     Chief Complaint  Patient presents with  . Weakness     (Consider location/radiation/quality/duration/timing/severity/associated sxs/prior Treatment) HPI Patient complains of right-sided headaches,, dizziness meaning lightheadedness, visual changes and twitching of her lips for one month, unchanged today and headaches are diffuse. Visual changes are such that "people's faces aren't quite right." She also complains of intermittent pain of her right leg for one month. There are no symptoms that are new today. Nothing makes symptoms better or worse. No treatment prior to coming here. No chest pain no fever. No other associated symptoms. she was seen bt edwar saguier, PA-c at [place] [facility] med[place] prior to coming here, sent here for further evaluation no other associated symptoms Past Medical History  Diagnosis Date  . PTSD (post-traumatic stress disorder)   . Raynaud's disease   . Anxiety   . Hypercholesteremia   . Stroke   . GERD (gastroesophageal reflux disease) 04/25/2015  . Hereditary and idiopathic peripheral neuropathy 04/25/2015   Past Surgical History  Procedure Laterality Date  . Wrist surgery    . Abdominal hysterectomy    . Knee surgery    . Hip surgery  Right  . Tee without cardioversion N/A 09/05/2014    Procedure: TRANSESOPHAGEAL ECHOCARDIOGRAM (TEE);  Surgeon: Wendall StadePeter C Nishan, MD;  [facility];  Service: Cardiovascular;  Laterality: N/A;  loop after  . Loop recorder implant N/A 09/05/2014    MDT LINQ implanted by Dr Johney FrameAllred for cryptogenic stroke   Family History  Problem Relation Age of Onset  . Colon cancer Brother   . Heart disease Mother   . Heart attack Father    History  Substance Use Topics  . Smoking status: Current Every Day Smoker  . Smokeless tobacco: Not on file  . Alcohol Use: Yes   OB History    No data  available     Review of Systems  Constitutional: Negative.   Eyes: Positive for visual disturbance.  Respiratory: Negative.   Cardiovascular: Negative.   Gastrointestinal: Negative.   Musculoskeletal: Positive for myalgias.       Right leg pain  Skin: Negative.   Neurological: Positive for tremors and headaches.  Psychiatric/Behavioral: Negative.   All other systems reviewed and are negative.     Allergies  Morphine and related and Tape  Home Medications   Prior to Admission medications   Medication Sig Start Date End Date Taking? Authorizing Provider  amitriptyline (ELAVIL) 25 MG tablet Take 1 tablet by mouth daily. 09/22/14   Historical Provider, MD  amLODipine (NORVASC) 5 MG tablet Take 1 tablet (5 mg total) by mouth daily. 04/25/15   Ramon DredgeEdward Saguier, PA-C  apixaban (ELIQUIS) 5 MG TABS tablet Take 1 tablet (5 mg total) by mouth 2 (two) times daily. 10/13/14   Hillis RangeJames Allred, MD  clonazePAM (KLONOPIN) 0.5 MG tablet Take 1 tablet (0.5 mg total) by mouth 2 (two) times daily as needed for anxiety. 04/25/15   Ramon DredgeEdward Saguier, PA-C  cyclobenzaprine (FLEXERIL) 5 MG tablet Take 5 mg by mouth 3 (three) times daily as needed for muscle spasms.    Historical Provider, MD  doxazosin (CARDURA) 8 MG tablet Take 12 mg by mouth at bedtime.     Historical Provider, MD  esomeprazole (NEXIUM) 40 MG capsule Take 1 capsule (40 mg total) by mouth daily. 04/25/15   Esperanza RichtersEdward Saguier, PA-C  gabapentin (NEURONTIN) 100 MG capsule Take 1 capsule (100 mg total) by mouth 3 (three) times daily. 04/25/15   Ramon Dredge Saguier, PA-C  HYDROcodone-acetaminophen (NORCO/VICODIN) 5-325 MG per tablet Take 1-2 tablets by mouth every 4 (four) hours as needed (pain).  10/03/14   Historical Provider, MD  methocarbamol (ROBAXIN) 500 MG tablet 1 tab po q hs as needed muscle spasms 04/25/15   Esperanza Richters, PA-C  rosuvastatin (CRESTOR) 20 MG tablet Take 1 tablet (20 mg total) by mouth at bedtime. 09/06/14   Osvaldo Shipper, MD  traZODone  (DESYREL) 100 MG tablet Take 1 tablet (100 mg total) by mouth at bedtime. 04/25/15   Esperanza Richters, PA-C   There were no vitals taken for this visit. Physical Exam  Constitutional: She is oriented to person, place, and time. She appears well-developed and well-nourished. No distress.  HENT:  Head: Normocephalic and atraumatic.  Eyes: Conjunctivae are normal. Pupils are equal, round, and reactive to light.  Neck: Neck supple. No tracheal deviation present. No thyromegaly present.  Cardiovascular: Normal rate and regular rhythm.   No murmur heard. Pulmonary/Chest: Effort normal and breath sounds normal.  Abdominal: Soft. Bowel sounds are normal. She exhibits no distension. There is no tenderness.  Musculoskeletal: Normal range of motion. She exhibits no edema or tenderness.  Neurological: She is alert and oriented to person, place, and time. She has normal reflexes. No cranial nerve deficit. Coordination normal.  Patient has slight drift of right arm on pronator drift however no pronator drift. Gait normal Romberg normal finger to nose normal DTR symmetric bilaterally knee jerk ankle jerk biceps toes to order bilaterally.slight twitchin of lips as I speak qwith her speech clear  Skin: Skin is warm and dry. No rash noted.  Psychiatric: She has a normal mood and affect.  Nursing note and vitals reviewed.   ED Course  Procedures (including critical care time) Labs Review Labs Reviewed - No data to display  Imaging Review No results found.   EKG Interpretation None     Results for orders placed or performed during the hospital encounter of 04/25/15  Basic metabolic panel  Result Value Ref Range   Sodium 138 135 - 145 mmol/L   Potassium 4.6 3.5 - 5.1 mmol/L   Chloride 103 101 - 111 mmol/L   CO2 29 22 - 32 mmol/L   Glucose, Bld 108 (H) 65 - 99 mg/dL   BUN 9 6 - 20 mg/dL   Creatinine, Ser 8.29 0.44 - 1.00 mg/dL   Calcium 9.3 8.9 - 56.2 mg/dL   GFR calc non Af Amer >60 >60 mL/min    GFR calc Af Amer >60 >60 mL/min   Anion gap 6 5 - 15  CBC with Differential/Platelet  Result Value Ref Range   WBC 8.7 4.0 - 10.5 K/uL   RBC 4.45 3.87 - 5.11 MIL/uL   Hemoglobin 13.7 12.0 - 15.0 g/dL   HCT 13.0 86.5 - 78.4 %   MCV 90.6 78.0 - 100.0 fL   MCH 30.8 26.0 - 34.0 pg   MCHC 34.0 30.0 - 36.0 g/dL   RDW 69.6 29.5 - 28.4 %   Platelets 175 150 - 400 K/uL   Neutrophils Relative % 76 43 - 77 %   Neutro Abs 6.7 1.7 - 7.7 K/uL   Lymphocytes Relative 17 12 - 46 %   Lymphs Abs 1.5 0.7 - 4.0 K/uL   Monocytes Relative 5 3 - 12 %   Monocytes Absolute 0.4 0.1 - 1.0 K/uL  Eosinophils Relative 1 0 - 5 %   Eosinophils Absolute 0.1 0.0 - 0.7 K/uL   Basophils Relative 1 0 - 1 %   Basophils Absolute 0.0 0.0 - 0.1 K/uL   Ct Head Wo Contrast  04/25/2015   CLINICAL DATA:  Headache and blurred vision for 1 month duration  EXAM: CT HEAD WITHOUT CONTRAST  TECHNIQUE: Contiguous axial images were obtained from the base of the skull through the vertex without intravenous contrast.  COMPARISON:  Head CT August 30, 2014 and September 01, 2014 brain MRI  FINDINGS: The ventricles are normal in size and configuration. There is no intracranial mass, hemorrhage, extra-axial fluid collection, or midline shift. The gray-white compartments appear normal. No abnormal attenuation is seen in the high medial left frontal lobe in the region of prior infarct noted on November 2015 brain MR. No acute infarct evident. Bony calvarium appears intact. The mastoid air cells are clear.  IMPRESSION: No focal gray -white lesions are identified. In particular, the prior infarct in the superior medial left frontal lobe documented on prior MR November 2015 is not appreciable on this current CT examination. No acute infarct evident. No hemorrhage or mass effect.   Electronically Signed   By: Bretta Bang III M.D.   On: 04/25/2015 16:12   5 pm pt alert, ambulatory , in no distress MDM  I don't feel patient has had an acute event  today. counciled pt for 5 minutes on smoking cessation In discussing at length with the patient her symptoms have been chronic, lasting at least a month and unchanged. I spoke with Dr.Sethi from [facility] plan office will call her to be seen within the next few days Diagnosis #1 headache #2  weakness Final diagnoses:  None  #3 tobacco abuse      Doug Sou, MD 04/25/15 1704  Doug Sou, MD 04/25/15 1708

## 2015-04-25 NOTE — Telephone Encounter (Signed)
Dr Ethelda ChickJacubowitz [facility] calling for consult. Please call and advise. He can be reached [phone].

## 2015-04-25 NOTE — Progress Notes (Signed)
Pre visit review using our clinic review tool, if applicable. No additional management support is needed unless otherwise documented below in the visit note. 

## 2015-04-25 NOTE — Telephone Encounter (Signed)
Error

## 2015-04-25 NOTE — ED Notes (Signed)
MD at bedside. 

## 2015-04-25 NOTE — ED Notes (Signed)
Pt sent from PMD office for eval of. stroke pt states"  1st visit with PMD I needed by klonopin refilled " " ive had a h/a x 1 month with blurred vision "

## 2015-04-25 NOTE — Assessment & Plan Note (Signed)
Hx of and will rx robaxin. To use sparingly at night if needed.

## 2015-04-25 NOTE — Assessment & Plan Note (Signed)
With history of ptsd. Rx clonopin. Will keep her on same dose as she was on for years.

## 2015-04-25 NOTE — ED Notes (Signed)
D/c home, follow up instructions reviewed and pt verbalized understanding
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
## 2015-04-25 NOTE — Assessment & Plan Note (Signed)
Related to buldging discs per pt. Rx gapapentin.

## 2015-04-25 NOTE — Patient Instructions (Addendum)
Generalized anxiety disorder With history of ptsd. Rx clonopin. Will keep her on same dose as she was on for years.  History of depression Stable now but some insomnia. Rx trazodone.  GERD (gastroesophageal reflux disease) nexium 40 mg q day. 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 Rx gapapentin.  Neck pain Hx of and will rx robaxin. To use sparingly at night if needed.   For hx of raynauds. Rx of amlodpine 5 mg. Reviewed with pharmacist.  For your history of stroke, paraxysmal atrial fib, recent  on and off ha, dizziness, vision changes and rt upper ext drift, I want you evaluated in ED. I will call down stairs and notify them you are on your way.  Follow up 1 month on chronic problems and sometime next week from ED as they determine.

## 2015-04-25 NOTE — Discharge Instructions (Signed)
Dr. Marlis EdelsonSethi's office [facility]) will call you within the next few days to schedule an office appointment. If you don't hear from them by Friday, 04/27/2015, Call to schedule the appointment. Tell office staff that Dr.Nicko Daher spoke with Dr. Pearlean BrownieSethi about your case. Ask your primary care physician to help you to stop smoking, as smoking is a risk factor for heart attack and stroke.

## 2015-04-25 NOTE — Assessment & Plan Note (Signed)
Stable now but some insomnia. Rx trazodone.

## 2015-04-25 NOTE — ED Notes (Signed)
Spoke with med tech from [facility]- Pt has Rx called in to [facility] for amlodipine, nexium, neurontin, robaxin, and trazodone. Pt has Rx for clonazepam with her

## 2015-04-25 NOTE — Assessment & Plan Note (Signed)
Refer to neurology. Hx of.

## 2015-04-26 ENCOUNTER — Ambulatory Visit (INDEPENDENT_AMBULATORY_CARE_PROVIDER_SITE_OTHER): Payer: Medicare Other | Admitting: Neurology

## 2015-04-26 ENCOUNTER — Encounter: Payer: Self-pay | Admitting: Neurology

## 2015-04-26 VITALS — BP 106/62 | HR 75 | Ht 65.0 in | Wt 177.8 lb

## 2015-04-26 DIAGNOSIS — I6990 Unspecified sequelae of unspecified cerebrovascular disease: Secondary | ICD-10-CM

## 2015-04-26 DIAGNOSIS — I6340 Cerebral infarction due to embolism of unspecified cerebral artery: Secondary | ICD-10-CM

## 2015-04-26 MED ORDER — AMITRIPTYLINE HCL 25 MG PO TABS: 25.0000 mg | ORAL_TABLET | Freq: Every day | ORAL | Status: DC

## 2015-04-26 NOTE — Patient Instructions (Signed)
I had a long d/w patient about his remote stroke and worsening of her stroke deficits recently likely due to recrudescence from being off her medications and increase anxiety, risk for recurrent stroke/TIAs, personally independently reviewed imaging studies and stroke evaluation results and answered questions.Continue Eliquis due to atrial fibrillation  for secondary stroke prevention and maintain strict control of hypertension with blood pressure goal below 130/90, diabetes with hemoglobin A1c goal below 6.5% and lipids with LDL cholesterol goal below 100 mg/dL. I also advised the patient to eat a healthy diet with plenty of whole grains, cereals, fruits and vegetables, exercise regularly and maintain ideal body weight. I advised her to establish medical follow-up at the [facility] free clinic. She was given a refill of Elavil  to help with her anxiety. Followup in the future with me in future only as necessary due to her financial constraints.  Stroke Prevention Some medical conditions and behaviors are associated with an increased chance of having a stroke. You may prevent a stroke by making healthy choices and managing medical conditions. HOW CAN I REDUCE MY RISK OF HAVING A STROKE?   Stay physically active. Get at least 30 minutes of activity on most or all days.  Do not smoke. It may also be helpful to avoid exposure to secondhand smoke.  Limit alcohol use. Moderate alcohol use is considered to be:  No more than 2 drinks per day for men.  No more than 1 drink per day for nonpregnant women.  Eat healthy foods. This involves:  Eating 5 or more servings of fruits and vegetables a day.  Making dietary changes that address high blood pressure (hypertension), high cholesterol, diabetes, or obesity.  Manage your cholesterol levels.  Making food choices that are high in fiber and low in saturated fat, trans fat, and cholesterol may control cholesterol levels.  Take any prescribed medicines  to control cholesterol as directed by your health care provider.  Manage your diabetes.  Controlling your carbohydrate and sugar intake is recommended to manage diabetes.  Take any prescribed medicines to control diabetes as directed by your health care provider.  Control your hypertension.  Making food choices that are low in salt (sodium), saturated fat, trans fat, and cholesterol is recommended to manage hypertension.  Take any prescribed medicines to control hypertension as directed by your health care provider.  Maintain a healthy weight.  Reducing calorie intake and making food choices that are low in sodium, saturated fat, trans fat, and cholesterol are recommended to manage weight.  Stop drug abuse.  Avoid taking birth control pills.  Talk to your health care provider about the risks of taking birth control pills if you are over 60 years old, smoke, get migraines, or have ever had a blood clot.  Get evaluated for sleep disorders (sleep apnea).  Talk to your health care provider about getting a sleep evaluation if you snore a lot or have excessive sleepiness.  Take medicines only as directed by your health care provider.  For some people, aspirin or blood thinners (anticoagulants) are helpful in reducing the risk of forming abnormal blood clots that can lead to stroke. If you have the irregular heart rhythm of atrial fibrillation, you should be on a blood thinner unless there is a good reason you cannot take them.  Understand all your medicine instructions.  Make sure that other conditions (such as anemia or atherosclerosis) are addressed. SEEK IMMEDIATE MEDICAL CARE IF:   You have sudden weakness or numbness of the  face, arm, or leg, especially on one side of the body.  Your face or eyelid droops to one side.  You have sudden confusion.  You have trouble speaking (aphasia) or understanding.  You have sudden trouble seeing in one or both eyes.  You have sudden  trouble walking.  You have dizziness.  You have a loss of balance or coordination.  You have a sudden, severe headache with no known cause.  You have new chest pain or an irregular heartbeat. Any of these symptoms may represent a serious problem that is an emergency. Do not wait to see if the symptoms will go away. Get medical help at once. Call your local emergency services (911 in [place] Do not drive yourself to the hospital. Document Released: 10/30/2004 Document Revised: 02/06/2014 Document Reviewed: 03/25/2013 [facility] Patient Information 2015 [place], [place]. This information is not intended to replace advice given to you by your health care provider. Make sure you discuss any questions you have with your health care provider.

## 2015-04-26 NOTE — Progress Notes (Signed)
[facility] [address] [place]. [place] [address] [phone]       OFFICE FOLLOW-UP NOTE  Dawn. Dawn Escobar Date of Birth:  [birth year] Medical Record Number:  [id number]   HPI: Dawn Escobar is a 57 Caucasian lady who is referred by Dr. Willa Rough from the emergency room for urgent neurological evaluation. 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 At that time she underwent extensive evaluation for source of embolism which was negative. However she had loop recorder inserted which a few months later showed paroxysmal atrial fibrillation. She has been on eliquis since then he is tolerating it well but she states that she did financial constraints was not able to get any of her other medications and stopped all her other medicines. For the last 1 month she's been having multifocal symptoms of right-sided headaches, muscle twitchings, dizziness, tremor and increased anxiety. She was taking clonazepam when necessary as well as admission: 25 mg at night for anxiety which she clearly has not been taking for the last month or so. She is also noted some subjective worsening of her mild right-sided weakness from a prior stroke as well as numbness. She actually was supposed to see me for follow-up following a previous stroke but she missed one appointment and could not afford to make another appointment due to her finding medical bills. She denies any new focal weakness, double vision, vertigo.  ROS:   14 system review of systems is positive for  weight gain, fatigue, leg swelling, murmur, trouble swallowing, itching, blurred and double vision, shortness of breath, diarrhea, easy bruising, clubbing: Thirst, joint pain, muscle cramps and aches, allergies, memory loss, headache, weakness, difficulty swallowing, dizziness, tremor, anxiety, depression, not enough sleep, decreased energy, change  in appetite and disinterest in activities.  PMH:  Past Medical History  Diagnosis Date  . PTSD (post-traumatic stress disorder)   . Raynaud's disease   . Anxiety   . Hypercholesteremia   . Stroke   . GERD (gastroesophageal reflux disease) 04/25/2015  . Hereditary and idiopathic peripheral neuropathy 04/25/2015  . Hypertension   . Atrial fibrillation   . Anxiety   . Migraine     Social History:  History   Social History  . Marital Status: Single    Spouse Name: N/A  . Number of Children: 0  . Years of Education: 12th   Occupational History  . n/a    Social History Main Topics  . Smoking status: Current Every Day Smoker -- 0.20 packs/day    Types: Cigarettes  . Smokeless tobacco: Never Used  . Alcohol Use: 0.0 oz/week    0 Standard drinks or equivalent per week  . Drug Use: No  . Sexual Activity: Not on file   Other Topics Concern  . Not on file   Social History Narrative   Patient lives at home alone.   Caffeine use: 1 cup of coffee and 20 oz of soda    Medications:   Current Outpatient Prescriptions on File Prior to Visit  Medication Sig Dispense Refill  . apixaban (ELIQUIS) 5 MG TABS tablet Take 1 tablet (5 mg total) by mouth 2 (two) times daily. 60 tablet 11  . clonazePAM (KLONOPIN) 0.5 MG tablet Take 1 tablet (0.5 mg total) by mouth 2 (two) times daily as needed for anxiety. 60 tablet 0  . doxazosin (CARDURA) 8 MG tablet Take 12 mg by mouth at bedtime.     Marland Kitchen  esomeprazole (NEXIUM) 40 MG capsule Take 1 capsule (40 mg total) by mouth daily. 30 capsule 3  . gabapentin (NEURONTIN) 100 MG capsule Take 1 capsule (100 mg total) by mouth 3 (three) times daily. 90 capsule 0  . HYDROcodone-acetaminophen (NORCO/VICODIN) 5-325 MG per tablet Take 1-2 tablets by mouth every 4 (four) hours as needed (pain).   0  . methocarbamol (ROBAXIN) 500 MG tablet 1 tab po q hs as needed muscle spasms 30 tablet 0  . rosuvastatin (CRESTOR) 20 MG tablet Take 1 tablet (20 mg total) by mouth  at bedtime. 30 tablet 2  . traZODone (DESYREL) 100 MG tablet Take 1 tablet (100 mg total) by mouth at bedtime. 30 tablet 0  . amLODipine (NORVASC) 5 MG tablet Take 1 tablet (5 mg total) by mouth daily. (Patient not taking: Reported on 04/26/2015) 30 tablet 0  . cyclobenzaprine (FLEXERIL) 5 MG tablet Take 5 mg by mouth 3 (three) times daily as needed for muscle spasms.     No current facility-administered medications on file prior to visit.    Allergies:   Allergies  Allergen Reactions  . Morphine And Related Hives  . Tape     Rash    Physical Exam General: well developed, well nourished, seated, in no evident distress Head: head normocephalic and atraumatic.  Neck: supple with no carotid or supraclavicular bruits Cardiovascular: regular rate and rhythm, no murmurs Musculoskeletal: no deformity Skin:  no rash/petichiae Vascular:  Normal pulses all extremities Filed Vitals:   04/26/15 1259  BP: 106/62  Pulse: 75   Neurologic Exam Mental Status: Awake and fully alert. Oriented to place and time. Recent and remote memory intact. Attention span, concentration and fund of knowledge appropriate. Mood and affect appropriate.  Cranial Nerves: Fundoscopic exam reveals sharp disc margins. Pupils equal, briskly reactive to light. Extraocular movements full without nystagmus. Visual fields full to confrontation. Hearing intact. Facial sensation intact. Face, tongue, palate moves normally and symmetrically.  Motor: Normal bulk and tone. Normal strength in all tested extremity muscles. Mild right grip weakness. Diminished fine finger movements on the right. Orbits left over right approximate it. Mild weakness of right ankle dorsiflexors and hip flexors only. Sensory.: intact to touch ,pinprick .position and vibratory sensation. Except slight diminished sensation in the right upper extremity and right lower face.  Coordination: Rapid alternating movements normal in all extremities. Finger-to-nose  and heel-to-shin performed accurately bilaterally. Gait and Station: Arises from chair without difficulty. Stance is normal. Gait demonstrates normal stride length and balance . Able to heel, toe and tandem walk without difficulty.  Reflexes: 1+ and symmetric. Toes downgoing.   NIHSS  1 Modified Rankin  1   ASSESSMENT: 10 year Caucasian lady with embolic left anterior cerebral artery infarct in November 2015 was subsequently found to have paroxysmal atrial fibrillation on loop recorder a few months later. She has new multifocal complaints of headaches, twitchings, dizziness, tremors which may be related to recrudescence of old deficits with perhaps component of underlying anxiety as she has been off her medications for last few months. Recent brain imaging shows no new stroke. Multiple vascular risk factors of hypertension, hyperlipidemia, atrial fibrillation and  prior stroke    PLAN: I had a long d/w patient about his remote stroke and worsening of her stroke deficits recently likely due to recrudescence from being off her medications and increase anxiety, risk for recurrent stroke/TIAs, personally independently reviewed imaging studies and stroke evaluation results and answered questions.Continue Eliquis due to atrial fibrillation  for secondary stroke prevention and maintain strict control of hypertension with blood pressure goal below 130/90, diabetes with hemoglobin A1c goal below 6.5% and lipids with LDL cholesterol goal below 100 mg/dL. I also advised the patient to eat a healthy diet with plenty of whole grains, cereals, fruits and vegetables, exercise regularly and maintain ideal body weight. I advised her to establish medical follow-up at the [facility] free clinic. She was given a refill of Elavil  to help with her anxiety. Followup in the future with me in future only as necessary due to her financial constraints. Delia Heady, MD   Note: This document was prepared with digital  dictation and possible smart phrase technology. Any transcriptional errors that result from this process are unintentional

## 2015-04-27 ENCOUNTER — Encounter: Payer: Self-pay | Admitting: Internal Medicine

## 2015-04-27 LAB — CUP PACEART REMOTE DEVICE CHECK
MDC IDC SESS DTM: 20160715155242
Zone Setting Detection Interval: 2000 ms
Zone Setting Detection Interval: 3000 ms
Zone Setting Detection Interval: 340 ms

## 2015-05-17 ENCOUNTER — Encounter: Payer: Self-pay | Admitting: Internal Medicine

## 2015-05-18 ENCOUNTER — Ambulatory Visit (INDEPENDENT_AMBULATORY_CARE_PROVIDER_SITE_OTHER): Payer: Medicare Other | Admitting: *Deleted

## 2015-05-18 DIAGNOSIS — I6340 Cerebral infarction due to embolism of unspecified cerebral artery: Secondary | ICD-10-CM

## 2015-05-19 ENCOUNTER — Other Ambulatory Visit: Payer: Self-pay | Admitting: Medical

## 2015-05-21 NOTE — Telephone Encounter (Signed)
Advised patient regarding medications refilled. Advised to come in in 1 month for follow up. Patient agreed.

## 2015-05-21 NOTE — Telephone Encounter (Signed)
I did refill her prescriptions. Remind her to follow up in one month.

## 2015-05-22 NOTE — Progress Notes (Signed)
Loop recorder 

## 2015-06-01 ENCOUNTER — Ambulatory Visit (HOSPITAL_BASED_OUTPATIENT_CLINIC_OR_DEPARTMENT_OTHER)
Admission: RE | Admit: 2015-06-01 | Discharge: 2015-06-01 | Disposition: A | Discharge status: Home / Self Care | Payer: Medicare Other | Source: Ambulatory Visit | Attending: Medical | Admitting: Medical

## 2015-06-01 ENCOUNTER — Encounter: Payer: Self-pay | Admitting: Medical

## 2015-06-01 ENCOUNTER — Inpatient Hospital Stay (HOSPITAL_COMMUNITY)
Admission: EM | Admit: 2015-06-01 | Discharge: 2015-06-03 | DRG: 066 | Disposition: A | Discharge status: Home / Self Care | Payer: Medicare Other | Attending: Internal Medicine | Admitting: Internal Medicine

## 2015-06-01 ENCOUNTER — Encounter (HOSPITAL_COMMUNITY): Payer: Self-pay | Admitting: *Deleted

## 2015-06-01 ENCOUNTER — Inpatient Hospital Stay (HOSPITAL_COMMUNITY): Payer: Medicare Other

## 2015-06-01 ENCOUNTER — Ambulatory Visit (INDEPENDENT_AMBULATORY_CARE_PROVIDER_SITE_OTHER): Payer: Medicare Other | Admitting: Medical

## 2015-06-01 ENCOUNTER — Other Ambulatory Visit: Payer: Self-pay

## 2015-06-01 VITALS — BP 143/68 | HR 92 | Temp 98.7°F | Ht 65.0 in | Wt 181.2 lb

## 2015-06-01 DIAGNOSIS — Z9119 Patient's noncompliance with other medical treatment and regimen: Secondary | ICD-10-CM | POA: Diagnosis present

## 2015-06-01 DIAGNOSIS — I639 Cerebral infarction, unspecified: Secondary | ICD-10-CM

## 2015-06-01 DIAGNOSIS — R29810 Facial weakness: Secondary | ICD-10-CM | POA: Diagnosis present

## 2015-06-01 DIAGNOSIS — F329 Major depressive disorder, single episode, unspecified: Secondary | ICD-10-CM | POA: Diagnosis present

## 2015-06-01 DIAGNOSIS — G609 Hereditary and idiopathic neuropathy, unspecified: Secondary | ICD-10-CM | POA: Diagnosis present

## 2015-06-01 DIAGNOSIS — I7300 Raynaud's syndrome without gangrene: Secondary | ICD-10-CM | POA: Diagnosis present

## 2015-06-01 DIAGNOSIS — N289 Disorder of kidney and ureter, unspecified: Secondary | ICD-10-CM | POA: Diagnosis present

## 2015-06-01 DIAGNOSIS — F411 Generalized anxiety disorder: Secondary | ICD-10-CM | POA: Diagnosis present

## 2015-06-01 DIAGNOSIS — Z7901 Long term (current) use of anticoagulants: Secondary | ICD-10-CM

## 2015-06-01 DIAGNOSIS — E785 Hyperlipidemia, unspecified: Secondary | ICD-10-CM | POA: Diagnosis not present

## 2015-06-01 DIAGNOSIS — I4891 Unspecified atrial fibrillation: Secondary | ICD-10-CM | POA: Diagnosis present

## 2015-06-01 DIAGNOSIS — Z79891 Long term (current) use of opiate analgesic: Secondary | ICD-10-CM

## 2015-06-01 DIAGNOSIS — E7800 Pure hypercholesterolemia, unspecified: Secondary | ICD-10-CM | POA: Diagnosis present

## 2015-06-01 DIAGNOSIS — G43909 Migraine, unspecified, not intractable, without status migrainosus: Secondary | ICD-10-CM | POA: Diagnosis present

## 2015-06-01 DIAGNOSIS — Z8659 Personal history of other mental and behavioral disorders: Secondary | ICD-10-CM

## 2015-06-01 DIAGNOSIS — I63522 Cerebral infarction due to unspecified occlusion or stenosis of left anterior cerebral artery: Principal | ICD-10-CM | POA: Diagnosis present

## 2015-06-01 DIAGNOSIS — G936 Cerebral edema: Secondary | ICD-10-CM | POA: Diagnosis not present

## 2015-06-01 DIAGNOSIS — I480 Paroxysmal atrial fibrillation: Secondary | ICD-10-CM | POA: Diagnosis present

## 2015-06-01 DIAGNOSIS — Z885 Allergy status to narcotic agent status: Secondary | ICD-10-CM | POA: Diagnosis not present

## 2015-06-01 DIAGNOSIS — G629 Polyneuropathy, unspecified: Secondary | ICD-10-CM | POA: Diagnosis present

## 2015-06-01 DIAGNOSIS — I6340 Cerebral infarction due to embolism of unspecified cerebral artery: Secondary | ICD-10-CM

## 2015-06-01 DIAGNOSIS — I10 Essential (primary) hypertension: Secondary | ICD-10-CM

## 2015-06-01 DIAGNOSIS — Z720 Tobacco use: Secondary | ICD-10-CM | POA: Diagnosis present

## 2015-06-01 DIAGNOSIS — I638 Other cerebral infarction: Secondary | ICD-10-CM | POA: Diagnosis not present

## 2015-06-01 DIAGNOSIS — R4781 Slurred speech: Secondary | ICD-10-CM | POA: Diagnosis present

## 2015-06-01 DIAGNOSIS — R29898 Other symptoms and signs involving the musculoskeletal system: Secondary | ICD-10-CM

## 2015-06-01 DIAGNOSIS — M6281 Muscle weakness (generalized): Secondary | ICD-10-CM | POA: Diagnosis not present

## 2015-06-01 DIAGNOSIS — K219 Gastro-esophageal reflux disease without esophagitis: Secondary | ICD-10-CM | POA: Diagnosis present

## 2015-06-01 DIAGNOSIS — Z8673 Personal history of transient ischemic attack (TIA), and cerebral infarction without residual deficits: Secondary | ICD-10-CM

## 2015-06-01 DIAGNOSIS — E119 Type 2 diabetes mellitus without complications: Secondary | ICD-10-CM | POA: Diagnosis present

## 2015-06-01 DIAGNOSIS — F4310 Post-traumatic stress disorder, unspecified: Secondary | ICD-10-CM | POA: Diagnosis present

## 2015-06-01 DIAGNOSIS — F17210 Nicotine dependence, cigarettes, uncomplicated: Secondary | ICD-10-CM | POA: Diagnosis present

## 2015-06-01 DIAGNOSIS — Z79899 Other long term (current) drug therapy: Secondary | ICD-10-CM

## 2015-06-01 LAB — LIPID PANEL
CHOL/HDL RATIO: 6
Cholesterol: 243 mg/dL — ABNORMAL HIGH (ref 0–200)
HDL: 43.4 mg/dL (ref >39.00)
LDL CALC: 185 mg/dL — AB (ref 0–99)
NONHDL: 199.32
TRIGLYCERIDES: 72 mg/dL (ref 0.0–149.0)
VLDL: 14.4 mg/dL (ref 0.0–40.0)

## 2015-06-01 LAB — URINALYSIS, ROUTINE W REFLEX MICROSCOPIC
Bilirubin Urine: NEGATIVE
GLUCOSE, UA: NEGATIVE mg/dL
KETONES UR: NEGATIVE mg/dL
Leukocytes, UA: NEGATIVE
Nitrite: NEGATIVE
PROTEIN: NEGATIVE mg/dL
Specific Gravity, Urine: 1.001 — ABNORMAL LOW (ref 1.005–1.030)
Urobilinogen, UA: 0.2 mg/dL (ref 0.0–1.0)
pH: 6.5 (ref 5.0–8.0)

## 2015-06-01 LAB — URINE MICROSCOPIC-ADD ON

## 2015-06-01 LAB — COMPREHENSIVE METABOLIC PANEL
ALT: 7 U/L (ref 0–35)
ALT: 9 U/L — ABNORMAL LOW (ref 14–54)
AST: 14 U/L (ref 0–37)
AST: 23 U/L (ref 15–41)
Albumin: 4.1 g/dL (ref 3.5–5.2)
Albumin: 4 g/dL (ref 3.5–5.0)
Alkaline Phosphatase: 63 U/L (ref 39–117)
Alkaline Phosphatase: 65 U/L (ref 38–126)
Anion gap: 7 (ref 5–15)
BUN: 14 mg/dL (ref 6–23)
BUN: 10 mg/dL (ref 6–20)
CALCIUM: 9.1 mg/dL (ref 8.4–10.5)
CHLORIDE: 103 meq/L (ref 96–112)
CO2: 28 meq/L (ref 19–32)
CO2: 26 mmol/L (ref 22–32)
CREATININE: 0.94 mg/dL (ref 0.44–1.00)
Calcium: 9.1 mg/dL (ref 8.9–10.3)
Chloride: 105 mmol/L (ref 101–111)
Creatinine, Ser: 0.84 mg/dL (ref 0.40–1.20)
GFR calc Af Amer: >60 mL/min (ref >60)
GFR: 75.28 mL/min (ref >60.00)
GLUCOSE: 102 mg/dL — AB (ref 70–99)
Glucose, Bld: 111 mg/dL — ABNORMAL HIGH (ref 65–99)
POTASSIUM: 4.7 meq/L (ref 3.5–5.1)
POTASSIUM: 4.8 mmol/L (ref 3.5–5.1)
Sodium: 137 mEq/L (ref 135–145)
Sodium: 138 mmol/L (ref 135–145)
TOTAL PROTEIN: 6.9 g/dL (ref 6.5–8.1)
Total Bilirubin: 0.4 mg/dL (ref 0.2–1.2)
Total Bilirubin: 0.5 mg/dL (ref 0.3–1.2)
Total Protein: 7.1 g/dL (ref 6.0–8.3)

## 2015-06-01 LAB — CBC
HEMATOCRIT: 37.8 % (ref 36.0–46.0)
HEMOGLOBIN: 12.9 g/dL (ref 12.0–15.0)
MCH: 30.6 pg (ref 26.0–34.0)
MCHC: 34.1 g/dL (ref 30.0–36.0)
MCV: 89.8 fL (ref 78.0–100.0)
Platelets: 151 10*3/uL (ref 150–400)
RBC: 4.21 MIL/uL (ref 3.87–5.11)
RDW: 13 % (ref 11.5–15.5)
WBC: 7.3 10*3/uL (ref 4.0–10.5)

## 2015-06-01 LAB — DIFFERENTIAL
BASOS PCT: 0 % (ref 0–1)
Basophils Absolute: 0 10*3/uL (ref 0.0–0.1)
EOS ABS: 0.1 10*3/uL (ref 0.0–0.7)
Eosinophils Relative: 2 % (ref 0–5)
Lymphocytes Relative: 18 % (ref 12–46)
Lymphs Abs: 1.3 10*3/uL (ref 0.7–4.0)
MONO ABS: 0.3 10*3/uL (ref 0.1–1.0)
MONOS PCT: 5 % (ref 3–12)
Neutro Abs: 5.5 10*3/uL (ref 1.7–7.7)
Neutrophils Relative %: 75 % (ref 43–77)

## 2015-06-01 LAB — RAPID URINE DRUG SCREEN, HOSP PERFORMED
AMPHETAMINES: NOT DETECTED
BARBITURATES: NOT DETECTED
BENZODIAZEPINES: NOT DETECTED
Cocaine: NOT DETECTED
Opiates: NOT DETECTED
Tetrahydrocannabinol: NOT DETECTED

## 2015-06-01 LAB — ETHANOL: Alcohol, Ethyl (B): <5 mg/dL (ref <5)

## 2015-06-01 LAB — PROTIME-INR
INR: 1.26 (ref 0.00–1.49)
Prothrombin Time: 15.9 seconds — ABNORMAL HIGH (ref 11.6–15.2)

## 2015-06-01 LAB — I-STAT TROPONIN, ED: Troponin i, poc: 0.01 ng/mL (ref 0.00–0.08)

## 2015-06-01 LAB — APTT: aPTT: 34 seconds (ref 24–37)

## 2015-06-01 MED ORDER — METHOCARBAMOL 500 MG PO TABS
500.0000 mg | ORAL_TABLET | Freq: Every day | ORAL | Status: DC
  Administered 2015-06-01 – 2015-06-02 (×2): 500 mg via ORAL
  Filled 2015-06-01 (×2): qty 1

## 2015-06-01 MED ORDER — ASPIRIN 325 MG PO TABS
325.0000 mg | ORAL_TABLET | Freq: Once | ORAL | Status: AC
  Administered 2015-06-01: 325 mg via ORAL
  Filled 2015-06-01: qty 1

## 2015-06-01 MED ORDER — ATORVASTATIN CALCIUM 80 MG PO TABS
80.0000 mg | ORAL_TABLET | Freq: Once | ORAL | Status: AC
  Administered 2015-06-01: 80 mg via ORAL
  Filled 2015-06-01: qty 1

## 2015-06-01 MED ORDER — AMITRIPTYLINE HCL 25 MG PO TABS
25.0000 mg | ORAL_TABLET | Freq: Every day | ORAL | Status: DC
  Administered 2015-06-01 – 2015-06-02 (×2): 25 mg via ORAL
  Filled 2015-06-01 (×3): qty 1

## 2015-06-01 MED ORDER — PANTOPRAZOLE SODIUM 40 MG PO TBEC
40.0000 mg | DELAYED_RELEASE_TABLET | Freq: Every day | ORAL | Status: DC
  Administered 2015-06-02 – 2015-06-03 (×2): 40 mg via ORAL
  Filled 2015-06-01 (×2): qty 1

## 2015-06-01 MED ORDER — ASPIRIN 325 MG PO TABS
325.0000 mg | ORAL_TABLET | Freq: Every day | ORAL | Status: DC
  Administered 2015-06-02: 325 mg via ORAL
  Filled 2015-06-01: qty 1

## 2015-06-01 MED ORDER — APIXABAN 5 MG PO TABS
5.0000 mg | ORAL_TABLET | Freq: Two times a day (BID) | ORAL | Status: DC
  Administered 2015-06-01 – 2015-06-03 (×4): 5 mg via ORAL
  Filled 2015-06-01 (×4): qty 1

## 2015-06-01 MED ORDER — NICOTINE 14 MG/24HR TD PT24
14.0000 mg | MEDICATED_PATCH | Freq: Every day | TRANSDERMAL | Status: DC
  Administered 2015-06-01 – 2015-06-03 (×3): 14 mg via TRANSDERMAL
  Filled 2015-06-01 (×3): qty 1

## 2015-06-01 MED ORDER — CLONAZEPAM 0.5 MG PO TABS: 0.5000 mg | ORAL_TABLET | Freq: Two times a day (BID) | ORAL | Status: DC | PRN

## 2015-06-01 MED ORDER — TRAZODONE HCL 100 MG PO TABS
100.0000 mg | ORAL_TABLET | Freq: Every day | ORAL | Status: DC
  Administered 2015-06-01 – 2015-06-02 (×2): 100 mg via ORAL
  Filled 2015-06-01 (×2): qty 1

## 2015-06-01 MED ORDER — ATORVASTATIN CALCIUM 80 MG PO TABS: 80.0000 mg | ORAL_TABLET | Freq: Every day | ORAL | Status: DC

## 2015-06-01 MED ORDER — STROKE: EARLY STAGES OF RECOVERY BOOK
Freq: Once | Status: AC
  Administered 2015-06-01
  Filled 2015-06-01: qty 1

## 2015-06-01 MED ORDER — ATORVASTATIN CALCIUM 80 MG PO TABS
80.0000 mg | ORAL_TABLET | Freq: Every day | ORAL | Status: DC
  Administered 2015-06-02: 80 mg via ORAL
  Filled 2015-06-01: qty 1

## 2015-06-01 MED ORDER — SENNOSIDES-DOCUSATE SODIUM 8.6-50 MG PO TABS: 1.0000 | ORAL_TABLET | Freq: Every evening | ORAL | Status: DC | PRN

## 2015-06-01 MED ORDER — GABAPENTIN 100 MG PO CAPS
100.0000 mg | ORAL_CAPSULE | Freq: Three times a day (TID) | ORAL | Status: DC
  Administered 2015-06-01 – 2015-06-03 (×5): 100 mg via ORAL
  Filled 2015-06-01 (×5): qty 1

## 2015-06-01 MED ORDER — ACETAMINOPHEN 500 MG PO TABS
500.0000 mg | ORAL_TABLET | Freq: Four times a day (QID) | ORAL | Status: DC | PRN
  Administered 2015-06-02 – 2015-06-03 (×2): 500 mg via ORAL
  Filled 2015-06-01 (×2): qty 1

## 2015-06-01 NOTE — ED Notes (Signed)
Pt reports having stroke symptoms x 2 weeks. Had ct scan that shows changes since previous stroke in nov. Pt has right side weakness, arm drift and slight facial droop x 2 weeks. Sent here for MRI.

## 2015-06-01 NOTE — ED Notes (Signed)
Attempted report 

## 2015-06-01 NOTE — Patient Instructions (Addendum)
Ct head without contrast today. Since you have some weakness of rt arm after day when your face drooped. Will compare this ct to last. If you get new symptoms then ED evaluation.  Your blood pressure is mild high today. But you have hx of the random very low blood pressures. I will rx you a blood pressure cuff. Check your blood pressures 2-3 times a day. Then call me on Monday with bp results. Will decide on whether to give amlodipine 2.5 mg a day vs 5 mg.  For your anxiety, I am going to refill your prior benzo.  After ct of head came back and showed acute vs subacute change and some recent slurred speech this am. I advise to go to [facility] ED now for work up and MRI. Pt agreed she would go. He fiancee will take her now.  Follow up after ED visit.  Will advise her to follow up with Dr. Pearlean Brownie in one week as she has that scheduled.

## 2015-06-01 NOTE — Telephone Encounter (Signed)
Pt would like to continue on the Clonzepam 0.5mg  tablets for anxiety. Please advise.

## 2015-06-01 NOTE — Consult Note (Signed)
Neurology Consultation Reason for Consult: Right-sided weakness Referring Physician: Dhungel, N  CC: Right-sided weakness  History is obtained from: Patient  HPI: Dawn Escobar is a 54 y.o. female with a history of atrial fibrillation on apixaban who was in her normal state of health until one and a half weeks ago when she had sudden onset right facial and right arm weakness. She saw her PCP today who ordered a head CT which showed a hypodensity in the left MCA distribution   LKW: 1.5 weeks ago tpa given?: no, out of window    ROS: A 14 point ROS was performed and is negative except as noted in the HPI.   Past Medical History  Diagnosis Date  . PTSD (post-traumatic stress disorder)   . Raynaud's disease   . Anxiety   . Hypercholesteremia   . Stroke   . GERD (gastroesophageal reflux disease) 04/25/2015  . Hereditary and idiopathic peripheral neuropathy 04/25/2015  . Hypertension   . Atrial fibrillation   . Anxiety   . Migraine      Family History  Problem Relation Age of Onset  . Colon cancer Brother   . Heart disease Mother   . Heart attack Father      Social History:  reports that she has been smoking Cigarettes.  She has been smoking about 0.20 packs per day. She has never used smokeless tobacco. She reports that she drinks alcohol. She reports that she does not use illicit drugs.   Exam: Current vital signs: BP 161/71 mmHg  Pulse 65  Temp(Src) 98.5 F (36.9 C) (Oral)  Resp 14  SpO2 97% Vital signs in last 24 hours: Temp:  [98.4 F (36.9 C)-98.7 F (37.1 C)] 98.5 F (36.9 C) (08/26 1625) Pulse Rate:  [59-92] 65 (08/26 1730) Resp:  [14-24] 14 (08/26 1730) BP: (124-161)/(51-109) 161/71 mmHg (08/26 1730) SpO2:  [95 %-100 %] 97 % (08/26 1730) Weight:  [82.192 kg (181 lb 3.2 oz)] 82.192 kg (181 lb 3.2 oz) (08/26 1100)   Physical Exam  Constitutional: Appears well-developed and well-nourished.  Psych: Affect appropriate to situation Eyes: No scleral  injection HENT: No OP obstrucion Head: Normocephalic.  Cardiovascular: Normal rate and regular rhythm.  Respiratory: Effort normal and breath sounds normal to anterior ascultation GI: Soft.  No distension. There is no tenderness.  Skin: WDI  Neuro: Mental Status: Patient is awake, alert, oriented to person, place, month, year, and situation. Patient is able to give a clear and coherent history. No signs of aphasia or neglect Cranial Nerves: II: Visual Fields are full. Pupils are equal, round, and reactive to light.   III,IV, VI: EOMI without ptosis or diploplia.  V: Facial sensation is symmetric to temperature VII: Facial movement is mildly decreased on the right VIII: hearing is intact to voice X: Uvula elevates symmetrically XI: Shoulder shrug is symmetric. XII: tongue is midline without atrophy or fasciculations.  Motor: Tone is normal. Bulk is normal. 5/5 strength was present on the left, on the right she has mild pronator drift in the arm with 4/5 weakness, good strength in the leg.  Sensory: Sensation is symmetric to light touch and temperature in the arms and legs. Cerebellar: FNF intact on left, consistent with weakness on right   I have reviewed labs in epic and the results pertinent to this consultation are: CMP-unremarkable  I have reviewed the images obtained: CT head-hypodensity in the left MCA distribution  Impression: 54 year old female with likely breakthrough stroke on apixaban. There is no clear  history of noncompliance. She will need to be evaluated for other possible modifiable risk factors. Given that we are already over week and a half out from her stroke and that she has not had any complications of her anticoagulation thus far, I would favor continuing it.  Recommendations: 1. HgbA1c, fasting lipid panel 2. MRI, MRA  of the brain without contrast 3. Frequent neuro checks 4. Echocardiogram 5. Carotid dopplers 6. Prophylactic therapy-Antiplatelet med:  Apixaban 7. Risk factor modification 8. Telemetry monitoring 9. PT consult, OT consult, Speech consult    Ritta Slot, MD [facility] [phone]  If 7pm- 7am, please page neurology on call as listed in AMION.

## 2015-06-01 NOTE — Progress Notes (Signed)
Subjective:    Patient ID: Dawn Escobar, female    DOB: [birth year], 54 y.o.   MRN: [id number]  HPI  Pt in states she did follow up after the ED. Negative CT scan and then she followed up with neurologist.   I sent her down to ED based on some concern for stroke.  Pt in past was on amlodipine in the past. Pt neurologist has concern that her blood pressure may drop too low(so pt has not taken bp med). Pt saw Dr. Pearlean Brownie. ED doctor talk with Dr. Pearlean Brownie day of visit. Pt has not taken bp medication yet.  Pt states history of random very low blood even without blood pressure medication. This  happened 3 yrs or so ago. Pt states extensive work up in the past never revealed. Both cardiology and neurology per pt report at that time could not determine cause.  Pt has not been checking her blood pressure at home. She does not have a meachine.   Dr. Pearlean Brownie recommended secondary stroke prevention.   "Continue Eliquis due to atrial fibrillation for secondary stroke prevention and maintain strict control of hypertension with blood pressure goal below 130/90, diabetes with hemoglobin A1c goal below 6.5% and lipids with LDL cholesterol goal below 100 mg/dL. I also advised the patient to eat a healthy diet with plenty of whole grains, cereals, fruits and vegetables, exercise regularly and maintain ideal body weight. I advised her to establish medical follow-up at the [facility] free clinic. She was given a refill of Elavil to help with her anxiety. Followup in the future with me in future only as necessary due to her financial constraints."  Since ED visit she states had hard time swallowing and had rt side of face droop. She can't remember exact date. This happened over a week ago. This happened for couple of hours. EMS checked bp 190/120. Then recheck about 3 minutes later 150/?. Despite droop they did take her in.   Pt droop did not persist. But her rt upper ext has felt week since then. She for a week.  Has low grip strength. Difficult to open bottles. After ct of head which showed changes pt tells me this am some slurred speech.  Pt is fasting. For lipid panel.   Also hx of anxiety.   Review of Systems  Constitutional: Negative for fever, chills and fatigue.  Respiratory: Negative for choking, shortness of breath and wheezing.   Cardiovascular: Negative for chest pain and palpitations.  Musculoskeletal: Negative for back pain.       Rt arm weakness.  Neurological: Positive for weakness. Negative for dizziness, seizures, syncope, facial asymmetry, speech difficulty, light-headedness, numbness and headaches.       Rt upper ext ext weakness.  Hematological: Negative for adenopathy. Does not bruise/bleed easily.    Past Medical History  Diagnosis Date  . PTSD (post-traumatic stress disorder)   . Raynaud's disease   . Anxiety   . Hypercholesteremia   . Stroke   . GERD (gastroesophageal reflux disease) 04/25/2015  . Hereditary and idiopathic peripheral neuropathy 04/25/2015  . Hypertension   . Atrial fibrillation   . Anxiety   . Migraine     Social History   Social History  . Marital Status: Single    Spouse Name: N/A  . Number of Children: 0  . Years of Education: 12th   Occupational History  . n/a    Social History Main Topics  . Smoking status: Current Every Day Smoker -- 0.20  packs/day    Types: Cigarettes  . Smokeless tobacco: Never Used  . Alcohol Use: 0.0 oz/week    0 Standard drinks or equivalent per week  . Drug Use: No  . Sexual Activity: Not on file   Other Topics Concern  . Not on file   Social History Narrative   Patient lives at home alone.   Caffeine use: 1 cup of coffee and 20 oz of soda    Past Surgical History  Procedure Laterality Date  . Wrist surgery    . Abdominal hysterectomy    . Knee surgery    . Hip surgery  Right  . Tee without cardioversion N/A 09/05/2014    Procedure: TRANSESOPHAGEAL ECHOCARDIOGRAM (TEE);  Surgeon: Wendall Stade, MD;  Location: [facility] ENDOSCOPY;  Service: Cardiovascular;  Laterality: N/A;  loop after  . 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 Colon cancer Brother   . Heart disease Mother   . Heart attack Father     Allergies  Allergen Reactions  . Morphine And Related Hives  . Tape     Rash    Current Outpatient Prescriptions on File Prior to Visit  Medication Sig Dispense Refill  . amitriptyline (ELAVIL) 25 MG tablet Take 1 tablet (25 mg total) by mouth daily. 90 tablet 3  . apixaban (ELIQUIS) 5 MG TABS tablet Take 1 tablet (5 mg total) by mouth 2 (two) times daily. 60 tablet 11  . clonazePAM (KLONOPIN) 0.5 MG tablet Take 1 tablet (0.5 mg total) by mouth 2 (two) times daily as needed for anxiety. 60 tablet 0  . cyclobenzaprine (FLEXERIL) 5 MG tablet Take 5 mg by mouth 3 (three) times daily as needed for muscle spasms.    Marland Kitchen doxazosin (CARDURA) 8 MG tablet Take 12 mg by mouth at bedtime.     Marland Kitchen esomeprazole (NEXIUM) 40 MG capsule Take 1 capsule (40 mg total) by mouth daily. 30 capsule 3  . gabapentin (NEURONTIN) 100 MG capsule TAKE 1 CAPSULE (100 MG TOTAL) BY MOUTH 3 (THREE) TIMES DAILY. 90 capsule 1  . HYDROcodone-acetaminophen (NORCO/VICODIN) 5-325 MG per tablet Take 1-2 tablets by mouth every 4 (four) hours as needed (pain).   0  . methocarbamol (ROBAXIN) 500 MG tablet 1 TABLET AT BEDTIME AS NEEDED FOR MUSCLE SPASMS 15 tablet 1  . rosuvastatin (CRESTOR) 20 MG tablet Take 1 tablet (20 mg total) by mouth at bedtime. 30 tablet 2  . traZODone (DESYREL) 100 MG tablet TAKE 1 TABLET (100 MG TOTAL) BY MOUTH AT BEDTIME. 30 tablet 1  . amLODipine (NORVASC) 5 MG tablet TAKE 1 TABLET (5 MG TOTAL) BY MOUTH DAILY. (Patient not taking: Reported on 06/01/2015) 30 tablet 3   No current facility-administered medications on file prior to visit.    BP 143/68 mmHg  Pulse 92  Temp(Src) 98.7 F (37.1  C) (Oral)  Ht 5\' 5"  (1.651 m)  Wt 181 lb 3.2 oz (82.192 kg)  BMI 30.15 kg/m2  SpO2 98%       Objective:   Physical Exam  General Mental Status- Alert. General Appearance- Not in acute distress.   Skin General: Color- Normal Color. Moisture- Normal Moisture.  Neck Carotid Arteries- Normal color. Moisture- Normal Moisture. No carotid bruits. No JVD.  Chest and Lung Exam Auscultation: Breath Sounds:-Normal.  Cardiovascular Auscultation:Rythm- Regular. Murmurs & Other Heart Sounds:Auscultation of the heart reveals-  No Murmurs.  Abdomen Inspection:-Inspeection Normal. Palpation/Percussion:Note:No mass. Palpation and Percussion of the abdomen reveal- Non Tender, Non Distended + BS, no rebound or guarding.    Neurologic Cranial Nerve exam:- CN III-XII intact(No nystagmus), symmetric smile. Drift Test:- No drift. Romberg Exam:- Negative.  Heal to Toe Gait exam:-Normal. Finger to Nose:- Normal/Intact Strength:- Rt upper ext 3-4/5  equal and symmetric strength both upper and lower extremities. Lt side 5/5.  Lower ext- 5/5 symmetric strength.      Assessment & Plan:  Ct head without contrast today. Since you have some weakness of rt arm after day when your face drooped. Will compare this ct to last. If you get new symptoms then ED evaluation.  Your blood pressure is mild high today. But you have hx of the random very low blood pressures. I will rx you a blood pressure cuff. Check your blood pressures 2-3 times a day. Then call me on Monday with bp results. Will decide on whether to give amlodipine 2.5 mg a day vs 5 mg.  For your anxiety, I am going to refill your prior benzo.  After ct of head came back and showed acute vs subacute change and some recent slurred speech this am. I advise to go to [facility] ED now for work up and MRI. Pt agreed she would go. He fiancee will take her now.  Follow up in 2 weeks or as needed.  Will advise her to follow up with Dr. Pearlean Brownie in one  week as she has that scheduled.

## 2015-06-01 NOTE — Progress Notes (Signed)
Pre visit review using our clinic review tool, if applicable. No additional management support is needed unless otherwise documented below in the visit note. 

## 2015-06-01 NOTE — H&P (Signed)
[facility] History and Physical  Dawn Escobar AVW:[id number] DOB: 09-Apr-1961 DOA: 06/01/2015  Referring physician: Dr Ambrose Finland PCP: Esperanza Richters, PA-C   Chief Complaint:  Right-sided weakness and slurred speech with facial droop for one and half weeks  HPI:  54 year old female with history of renal disease, hypertension, anxiety, ongoing tobacco use, GERD with hospitalization in November 2015 for embolic left anterior cerebral artery infarct. She had extensive evaluation for the source of embolism which was negative. She had a loop recorder inserted and was discharged home and a few months later it showed paroxysmal A. fib. She was then started on Eliquis which she has been tolerating and compliant with but due to financial reasons she was not able to afford any other medications and stopped all her other medications. She reports having some right sided weakness following the stroke which resolved after a few months. She saw her new PCP about a month back for neck pain and upper back pain and some occasional numbness in her right arm and she was referred to neurologist Dr. Pearlean Brownie as a follow-up. She saw Dr. Pearlean Brownie one month back as a follow-up and recommended to continue Eliquis. Also seen was taken off amlodipine with concerned that it may drop her blood pressure too low. About one and half week back patient had acute onset of slurred speech with right facial droop and right upper extremity weakness for which she called 911. When EMS arrived they check her blood pressure and it was 180/120 mmHg. She told the EMS that she did not want to come to the hospital unless she had severe symptoms. She reports that the EMS personnel told her that her symptoms did not appear to be related to stroke and to follow-up with her PCP. Patient reports that she had ongoing right upper extremity weakness, slurred speech with word finding difficulty off and on, with facial droop noticed by her fianc.  She also reports some numbness in her right arm. Her fianc also noticed that she had some unsteady gait. Patient denies headache, dizziness, fever, chills, nausea , vomiting, chest pain, palpitations, SOB, abdominal pain, bowel or urinary symptoms. Denies change in weight or appetite. Patient continues to smoke half a pack of cigarettes per day. She denies use of alcohol or illicit drug use.  Patient went to see her PCP as a follow up today. Her blood pressure was mildly elevated. Given her symptoms of off-and-on right facial droop and right-sided weakness, he ordered a head CT which showed acute versus subacute infarct over left anterior lobe she was referred to [facility] ED for further workup.  In the ED patient's vitals were stable. She cleared bedside swallow evaluation. Blood work done showed normal CBC and complains of metabolic panel. Head CT did results as outlined above. LDL checked at PCP office showed last 12 of 243 and LDL of 185.  Hospitalists admission requested to telemetry.  Review of Systems:  Constitutional: Denies fever, chills, diaphoresis, appetite change and fatigue.  HEENT: Denies visual or hearing symptoms, injection, sore throat, difficulty swallowing, neck pain or stiffness Respiratory: Denies SOB, DOE, cough, chest tightness,  and wheezing.   Cardiovascular: Denies chest pain, palpitations and leg swelling.  Gastrointestinal: Denies nausea, vomiting, abdominal pain, diarrhea, constipation, blood in stool and abdominal distention.  Genitourinary: Denies dysuria,  hematuria, flank pain and difficulty urinating.  Endocrine: Denies: hot or cold intolerance,  polyuria, polydipsia. Musculoskeletal: Denies myalgias, back pain, joint swelling, arthralgias and gait problem.  Skin: Denies pallor, rash  and wound.  Neurological: Right-sided weakness and numbness, slurred speech, right facial droop, Denies dizziness, seizures, syncope,  light-headedness, and headaches.   Hematological: Denies adenopathy.  Psychiatric/Behavioral: Denies confusion   Past Medical History  Diagnosis Date  . PTSD (post-traumatic stress disorder)   . Raynaud's disease   . Anxiety   . Hypercholesteremia   . Stroke   . GERD (gastroesophageal reflux disease) 04/25/2015  . Hereditary and idiopathic peripheral neuropathy 04/25/2015  . Hypertension   . Atrial fibrillation   . Anxiety   . Migraine    Past Surgical History  Procedure Laterality Date  . Wrist surgery    . Abdominal hysterectomy    . Knee surgery    . Hip surgery  Right  . Tee without cardioversion N/A 09/05/2014    Procedure: TRANSESOPHAGEAL ECHOCARDIOGRAM (TEE);  Surgeon: Wendall Stade, MD;  Location: [facility] ENDOSCOPY;  Service: Cardiovascular;  Laterality: N/A;  loop after  . 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  She has been smoking about 0.20 packs per day. She has never used smokeless tobacco. She reports that she drinks alcohol. She reports that she does not use illicit drugs.  Allergies  Allergen Reactions  . Morphine And Related Hives  . Tape Rash    No tape at all    Family History  Problem Relation Age of Onset  . Colon cancer Brother   . Heart disease Mother   . Heart attack Father     Prior to Admission medications   Medication Sig Start Date End Date Taking? Authorizing Provider  acetaminophen (TYLENOL) 500 MG tablet Take 500 mg by mouth every 6 (six) hours as needed for mild pain, moderate pain or headache.   Yes Historical Provider, MD  amitriptyline (ELAVIL) 25 MG tablet Take 1 tablet (25 mg total) by mouth daily. Patient taking differently: Take 25 mg by mouth at bedtime.  04/26/15  Yes Micki Riley, MD  apixaban (ELIQUIS) 5 MG TABS tablet Take 1 tablet (5 mg total) by mouth 2 (two) times daily. 10/13/14  Yes Hillis Range, MD         esomeprazole (NEXIUM) 40 MG  capsule Take 1 capsule (40 mg total) by mouth daily. 04/25/15  Yes Edward Saguier, PA-C  gabapentin (NEURONTIN) 100 MG capsule TAKE 1 CAPSULE (100 MG TOTAL) BY MOUTH 3 (THREE) TIMES DAILY. 05/21/15  Yes Edward Saguier, PA-C  methocarbamol (ROBAXIN) 500 MG tablet 1 TABLET AT BEDTIME AS NEEDED FOR MUSCLE SPASMS Patient taking differently: TAKE 500 MG BY MOUTH DAILY AT BEDTIME 05/21/15  Yes Edward Saguier, PA-C  traZODone (DESYREL) 100 MG tablet TAKE 1 TABLET (100 MG TOTAL) BY MOUTH AT BEDTIME. 05/21/15  Yes Edward Saguier, PA-C  amLODipine (NORVASC) 5 MG tablet TAKE 1 TABLET (5 MG TOTAL) BY MOUTH DAILY. Patient not taking: Reported on 06/01/2015 05/21/15   Esperanza Richters, PA-C  clonazePAM (KLONOPIN) 0.5 MG tablet Take 1 tablet (0.5 mg total) by mouth 2 (two) times daily as needed for anxiety. Patient not taking: Reported on 06/01/2015 04/25/15   Esperanza Richters, PA-C  clonazePAM (KLONOPIN) 0.5 MG tablet Take 1 tablet (0.5 mg total) by mouth 2 (two) times daily as needed for anxiety. Patient not taking: Reported on 06/01/2015 06/01/15   Esperanza Richters, PA-C    Physical Exam:  Filed Vitals:   06/01/15 1645 06/01/15 1700 06/01/15 1715 06/01/15 1730  BP: 138/72 124/72 125/51 161/71  Pulse: 61 64 59 65  Temp:      TempSrc:      Resp: 24 15 22 14   SpO2: 100% 95% 98% 97%    Constitutional: Vital signs reviewed. Middle aged female in no acute distress HEENT: no pallor, no icterus, moist oral mucosa, no cervical lymphadenopathy Cardiovascular: RRR, S1 normal, S2 normal, no MRG Chest: CTAB, no wheezes, rales, or rhonchi Abdominal: Soft. Non-tender, non-distended, bowel sounds are normal,  Ext: warm, no edema Neurological: A&O x3, cranial nerves 2-12 intact. 4/5 power in right upper extremity, plantar equivocal bilaterally. Reports diminished sensation in right arm. Cerebellar function intact. Gait not assessed.  Labs on Admission:  Basic Metabolic Panel:  Recent Labs Lab 06/01/15 1208 06/01/15 1459   NA 137 138  K 4.7 4.8  CL 103 105  CO2 28 26  GLUCOSE 102* 111*  BUN 14 10  CREATININE 0.84 0.94  CALCIUM 9.1 9.1   Liver Function Tests:  Recent Labs Lab 06/01/15 1208 06/01/15 1459  AST 14 23  ALT 7 9*  ALKPHOS 63 65  BILITOT 0.4 0.5  PROT 7.1 6.9  ALBUMIN 4.1 4.0   No results for input(s): LIPASE, AMYLASE in the last 168 hours. No results for input(s): AMMONIA in the last 168 hours. CBC:  Recent Labs Lab 06/01/15 1459  WBC 7.3  NEUTROABS 5.5  HGB 12.9  HCT 37.8  MCV 89.8  PLT 151   Cardiac Enzymes: No results for input(s): CKTOTAL, CKMB, CKMBINDEX, TROPONINI in the last 168 hours. BNP: Invalid input(s): POCBNP CBG: No results for input(s): GLUCAP in the last 168 hours.  Radiological Exams on Admission: Ct Head Wo Contrast  06/01/2015   CLINICAL DATA:  Right-sided facial droop. Slurred speech for 1 week. Right upper extremity weakness and increased tiredness. History of stroke in 2015, November.  EXAM: CT HEAD WITHOUT CONTRAST  TECHNIQUE: Contiguous axial images were obtained from the base of the skull through the vertex without intravenous contrast.  COMPARISON:  04/25/2015.  MRI of 09/01/2014.  FINDINGS: Sinuses/Soft tissues: Clear paranasal sinuses and mastoid air cells.  Intracranial: No hemorrhage, hydrocephalus, intra-axial, or extra-axial fluid collection. Subtle left frontal lobe hypoattenuation and subtle effacement of sulci. Example images 19 and 20 of series 3. No midline shift.  IMPRESSION: Subtle edema within the left frontal lobe is suspicious for acute or subacute MCA distribution infarct.  These results will be called to the ordering clinician or representative by the Radiologist Assistant, and communication documented in the PACS or zVision Dashboard.   Electronically Signed   By: Jeronimo Greaves M.D.   On: 06/01/2015 12:36    EKG: Independently reviewed, normal sinus rhythm, no ST-T changes  Assessment/Plan Active Problems:    Hypercholesterolemia   Tobacco abuse   Acute CVA (cerebrovascular accident)   Atrial fibrillation   Generalized anxiety disorder   History of depression   GERD (gastroesophageal reflux disease)    Acute CVA Admit to telemetry  neuro checks every 2 hours for 12 hours and then every 4 hours. Head CT on admission showing subtle edema within the left frontal lobe suspicious for acute versus subacute MCA distribution infarct. Ordered MRI brain, MRA head. -Check 2D echo, carotid doppler, A1C. lipid panel done this morning shows LDL. Will place her on Lipitor 80 mg daily. - Cleared Swallow eval in the ED PT, OT. -Place on ASA 325 mg daily. Holding eliquis until MRI result. . Allow permissive HTN -I have consulted the neuro  hospitalist. Follow up with recommendations.  -Patient following with Dr. Pearlean Brownie as outpatient.   paroxysmal A. fib Detected on loop recorder. On eliquis which is being held for now. East on full dose aspirin. Patient reports compliance.  Generalized anxiety disorder Continue Klonopin and trazodone.  Peripheral neuropathy and muscle pains Continue Neurontin and Robaxin  Essential hypertension Allow permissive blood pressure. Not taking any medications.  Tobacco use Counseled on cessation. I will place her on nicotine patch.   Diet:cardiac  DVT prophylaxis: sq lovenox.    Code Status: full code Family Communication: discussed with patient and her fianc bedside Disposition Plan: admit to telemetry  Less [place], [facility] Pager [phone]  Total time spent on admission :70 minutes  If 7PM-7AM, please contact night-coverage [email or web address] Password [facility] 06/01/2015, 5:45 PM

## 2015-06-01 NOTE — Telephone Encounter (Signed)
Pt is a [place] ED and says that there is a two hour wait for the MRI and there are no orders in the system. Please advise.

## 2015-06-01 NOTE — ED Notes (Signed)
Charge nurse will come interrogate loop recorder

## 2015-06-01 NOTE — Progress Notes (Signed)
Report received from ED RN, ED RN will have ED charge RN interrogate loop recorder prior to transport to 5M12.    Sim Boast, RN

## 2015-06-01 NOTE — Telephone Encounter (Signed)
Abnormal xray. inflltrate vs neoplasm. So i placed ct order. Will you try to get this scheduled for next week by wed, Thursday or Friday. Thanks.

## 2015-06-01 NOTE — ED Provider Notes (Signed)
CSN: [id number]     Arrival date & time 06/01/15  1412 History   First MD Initiated Contact with Patient 06/01/15 1611     Chief Complaint  Patient presents with  . Stroke Symptoms     (Consider location/radiation/quality/duration/timing/severity/associated sxs/prior Treatment) HPI  Patient is a 54 year old female presenting with history of stroke. Patient says that she the last week and half has felt that she's had trouble speaking and moving her right hand with the same dexterity as prior. She last followed up with Dr. Pearlean Brownie, her neurologist 2 months ago. Patient is on Eliquis for prior stroke. She also has a loop recorder recorder.  Went to a general doctor today who did a CT which showed edema consistent with potential MCA  subacute infarct..   Past Medical History  Diagnosis Date  . PTSD (post-traumatic stress disorder)   . Raynaud's disease   . Anxiety   . Hypercholesteremia   . Stroke   . GERD (gastroesophageal reflux disease) 04/25/2015  . Hereditary and idiopathic peripheral neuropathy 04/25/2015  . Hypertension   . Atrial fibrillation   . Anxiety   . Migraine    Past Surgical History  Procedure Laterality Date  . Wrist surgery    . Abdominal hysterectomy    . Knee surgery    . Hip surgery  Right  . Tee without cardioversion N/A 09/05/2014    Procedure: TRANSESOPHAGEAL ECHOCARDIOGRAM (TEE);  Surgeon: Wendall Stade, MD;  Location: [facility] ENDOSCOPY;  Service: Cardiovascular;  Laterality: N/A;  loop after  . 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 Colon cancer Brother   . Heart disease Mother   . Heart attack Father    Social History  Substance Use Topics  . Smoking status: Current Every Day Smoker -- 0.20 packs/day    Types: Cigarettes  . Smokeless tobacco: Never Used  . Alcohol Use: 0.0 oz/week    0 Standard drinks or equivalent per week   OB History    No data available     Review of Systems  Constitutional: Negative for activity change and fatigue.  HENT: Negative for congestion and drooling.   Eyes: Negative for discharge.  Respiratory: Negative for cough and chest tightness.   Cardiovascular: Negative for chest pain.  Gastrointestinal: Negative for abdominal distention.  Genitourinary: Negative for dysuria and difficulty urinating.  Musculoskeletal: Negative for joint swelling.  Skin: Negative for rash.  Allergic/Immunologic: Negative for immunocompromised state.  Neurological: Positive for speech difficulty and weakness. Negative for seizures.  Psychiatric/Behavioral: Negative for behavioral problems and agitation.      Allergies  Morphine and related and Tape  Home Medications   Prior to Admission medications   Medication Sig Start Date End Date Taking? Authorizing Provider  amitriptyline (ELAVIL) 25 MG tablet Take 1 tablet (25 mg total) by mouth daily. 04/26/15   Micki Riley, MD  amLODipine (NORVASC) 5 MG tablet TAKE 1 TABLET (5 MG TOTAL) BY MOUTH DAILY. Patient not taking: Reported on 06/01/2015 05/21/15   Esperanza Richters, PA-C  apixaban (ELIQUIS) 5 MG TABS tablet Take 1 tablet (5 mg total) by mouth 2 (two) times daily. 10/13/14   Hillis Range, MD  clonazePAM (KLONOPIN) 0.5 MG tablet Take 1 tablet (0.5 mg total) by mouth 2 (two) times daily as needed for anxiety. 04/25/15   Ramon Dredge Saguier, PA-C  clonazePAM (KLONOPIN) 0.5 MG tablet Take 1  tablet (0.5 mg total) by mouth 2 (two) times daily as needed for anxiety. 06/01/15   Ramon Dredge Saguier, PA-C  cyclobenzaprine (FLEXERIL) 5 MG tablet Take 5 mg by mouth 3 (three) times daily as needed for muscle spasms.    Historical Provider, MD  doxazosin (CARDURA) 8 MG tablet Take 12 mg by mouth at bedtime.     Historical Provider, MD  esomeprazole (NEXIUM) 40 MG capsule Take 1 capsule (40 mg total) by mouth daily. 04/25/15   Ramon Dredge Saguier, PA-C  gabapentin (NEURONTIN) 100 MG capsule TAKE 1  CAPSULE (100 MG TOTAL) BY MOUTH 3 (THREE) TIMES DAILY. 05/21/15   Ramon Dredge Saguier, PA-C  HYDROcodone-acetaminophen (NORCO/VICODIN) 5-325 MG per tablet Take 1-2 tablets by mouth every 4 (four) hours as needed (pain).  10/03/14   Historical Provider, MD  methocarbamol (ROBAXIN) 500 MG tablet 1 TABLET AT BEDTIME AS NEEDED FOR MUSCLE SPASMS 05/21/15   Esperanza Richters, PA-C  rosuvastatin (CRESTOR) 20 MG tablet Take 1 tablet (20 mg total) by mouth at bedtime. 09/06/14   Osvaldo Shipper, MD  traZODone (DESYREL) 100 MG tablet TAKE 1 TABLET (100 MG TOTAL) BY MOUTH AT BEDTIME. 05/21/15   Edward Saguier, PA-C   BP 132/109 mmHg  Pulse 67  Temp(Src) 98.5 F (36.9 C) (Oral)  Resp 16  SpO2 100% Physical Exam  Constitutional: She is oriented to person, place, and time. She appears well-developed and well-nourished.  HENT:  Head: Normocephalic and atraumatic.  Eyes: Conjunctivae are normal. Right eye exhibits no discharge.  Neck: Neck supple.  Cardiovascular: Normal rate, regular rhythm and normal heart sounds.   No murmur heard. Pulmonary/Chest: Effort normal and breath sounds normal. She has no wheezes. She has no rales.  Abdominal: Soft. She exhibits no distension. There is no tenderness.  Musculoskeletal: Normal range of motion. She exhibits no edema.  Neurological: She is oriented to person, place, and time. A cranial nerve deficit is present.  R arm mild weakness. + R protanor drift. Mildly slurred speech.  Skin: Skin is warm and dry. No rash noted. She is not diaphoretic.  Psychiatric: She has a normal mood and affect. Her behavior is normal.  Nursing note and vitals reviewed.   ED Course  Procedures (including critical care time) Labs Review Labs Reviewed  PROTIME-INR - Abnormal; Notable for the following:    Prothrombin Time 15.9 (*)    All other components within normal limits  COMPREHENSIVE METABOLIC PANEL - Abnormal; Notable for the following:    Glucose, Bld 111 (*)    ALT 9 (*)    All  other components within normal limits  APTT  CBC  DIFFERENTIAL  ETHANOL  URINE RAPID DRUG SCREEN, HOSP PERFORMED  URINALYSIS, ROUTINE W REFLEX MICROSCOPIC (NOT AT [facility])  I-STAT TROPOININ, ED    Imaging Review Ct Head Wo Contrast  06/01/2015   CLINICAL DATA:  Right-sided facial droop. Slurred speech for 1 week. Right upper extremity weakness and increased tiredness. History of stroke in 2015, November.  EXAM: CT HEAD WITHOUT CONTRAST  TECHNIQUE: Contiguous axial images were obtained from the base of the skull through the vertex without intravenous contrast.  COMPARISON:  04/25/2015.  MRI of 09/01/2014.  FINDINGS: Sinuses/Soft tissues: Clear paranasal sinuses and mastoid air cells.  Intracranial: No hemorrhage, hydrocephalus, intra-axial, or extra-axial fluid collection. Subtle left frontal lobe hypoattenuation and subtle effacement of sulci. Example images 19 and 20 of series 3. No midline shift.  IMPRESSION: Subtle edema within the left frontal lobe is suspicious for acute or subacute  MCA distribution infarct.  These results will be called to the ordering clinician or representative by the Radiologist Assistant, and communication documented in the PACS or zVision Dashboard.   Electronically Signed   By: Jeronimo Greaves M.D.   On: 06/01/2015 12:36   I have personally reviewed and evaluated these images and lab results as part of my medical decision-making.   EKG Interpretation None      MDM   Final diagnoses:  None    Patient is a 54 year old female presenting with stroke symptoms. CT shows evidence of subacute stroke. We will do a stroke protocol. She is well outside any window for treatment. We will discuss with neurology, admit for MRI and further workup.      Domingue Coltrain Randall An, MD 06/01/15 2250

## 2015-06-01 NOTE — Telephone Encounter (Signed)
After reviewing pt ct head. And discussing with her that some slurred speech this am. Advised to go to [facility] ED now. Pt Dawn Escobar is going to drive her over now. I advised that she will need MRI of the head. Pt agreed would go now.

## 2015-06-01 NOTE — Progress Notes (Signed)
Pt is admitted to [phone]. Admission vital is stable

## 2015-06-01 NOTE — Telephone Encounter (Signed)
I gave pt a rx of clonazapam when she was in office. Then go rx request refill. Did she loose the rx??She is in hospital right now so call on Monday or Tuesday.

## 2015-06-02 ENCOUNTER — Inpatient Hospital Stay (HOSPITAL_COMMUNITY): Payer: Medicare Other

## 2015-06-02 ENCOUNTER — Telehealth: Payer: Self-pay | Admitting: Neurology

## 2015-06-02 DIAGNOSIS — I639 Cerebral infarction, unspecified: Secondary | ICD-10-CM

## 2015-06-02 DIAGNOSIS — E780 Pure hypercholesterolemia: Secondary | ICD-10-CM

## 2015-06-02 DIAGNOSIS — Z720 Tobacco use: Secondary | ICD-10-CM

## 2015-06-02 DIAGNOSIS — I480 Paroxysmal atrial fibrillation: Secondary | ICD-10-CM

## 2015-06-02 MED ORDER — CLONAZEPAM 0.5 MG PO TABS
0.5000 mg | ORAL_TABLET | Freq: Two times a day (BID) | ORAL | Status: DC | PRN
  Administered 2015-06-02 – 2015-06-03 (×3): 0.5 mg via ORAL
  Filled 2015-06-02 (×3): qty 1

## 2015-06-02 MED ORDER — INFLUENZA VAC SPLIT QUAD 0.5 ML IM SUSY
0.5000 mL | PREFILLED_SYRINGE | INTRAMUSCULAR | Status: AC
  Administered 2015-06-03: 0.5 mL via INTRAMUSCULAR
  Filled 2015-06-02: qty 0.5

## 2015-06-02 NOTE — Progress Notes (Signed)
  Echocardiogram 2D Echocardiogram has been performed.  Leta Jungling M 06/02/2015, 1:49 PM

## 2015-06-02 NOTE — Telephone Encounter (Signed)
The patient contacted me on 06/01/2015. She had onset of right-sided arm numbness, some speech changes. She went to her primary doctor, a repeat CT scan showed new findings, she was sent to the emergency room. MRI the brain done shows an early subacute left middle cerebral artery distribution stroke. The patient is being admitted for further evaluation.  MRI brain June 01, 2015:  IMPRESSION: MRI HEAD IMPRESSION:  1. Early subacute acute left MCA territory cortical infarct as above. There is associated petechial hemorrhage without hemorrhagic transformation. No significant mass effect. 2. Faint hyperintense signal intensity on DWI sequence within the deep white matter of the high posterior left frontal lobe. While this finding may be related to chronic small vessel ischemic changes, this may also reflect sequela of subacute ischemia.  MRA HEAD IMPRESSION:  1. Negative intracranial MRA. No large or proximal arterial branch occlusion. No hemodynamically significant or correctable stenosis. 2. Diminutive right vertebral artery terminates in PICA.

## 2015-06-02 NOTE — Progress Notes (Signed)
STROKE TEAM PROGRESS NOTE  HPI Dawn Escobar is a 54 y.o. female with a history of atrial fibrillation on apixaban who was in her normal state of health until one and a half weeks ago when she had sudden onset right facial and right arm weakness. She saw her PCP today who ordered a head CT which showed a hypodensity in the left MCA distribution   LKW: 1.5 weeks ago tpa given?: no, out of window    SUBJECTIVE (INTERVAL HISTORY) She is feeling better. She is walking with PT. She is having difficulty with speech output and her right leg and hand feel a little weak but improved. She endorses compliance with Eliquis.    OBJECTIVE Temp:  [97.9 F (36.6 C)-98.6 F (37 C)] 98.6 F (37 C) (08/27 1042) Pulse Rate:  [59-89] 63 (08/27 1042) Cardiac Rhythm:  [-] Sinus bradycardia (08/27 0700) Resp:  [14-24] 18 (08/27 1042) BP: (124-161)/(51-109) 154/65 mmHg (08/27 1042) SpO2:  [95 %-100 %] 99 % (08/27 1042)     Component Value Date/Time   CHOL 243* 06/01/2015 1208   TRIG 72.0 06/01/2015 1208   HDL 43.40 06/01/2015 1208   CHOLHDL 6 06/01/2015 1208   VLDL 14.4 06/01/2015 1208   LDLCALC 185* 06/01/2015 1208   Lab Results  Component Value Date   HGBA1C 5.6 08/31/2014      Component Value Date/Time   LABOPIA NONE DETECTED 06/01/2015 1636   COCAINSCRNUR NONE DETECTED 06/01/2015 1636   LABBENZ NONE DETECTED 06/01/2015 1636   AMPHETMU NONE DETECTED 06/01/2015 1636   THCU NONE DETECTED 06/01/2015 1636   LABBARB NONE DETECTED 06/01/2015 1636     Basic Metabolic Panel:  Recent Labs Lab 06/01/15 1208 06/01/15 1459  NA 137 138  K 4.7 4.8  CL 103 105  CO2 28 26  GLUCOSE 102* 111*  BUN 14 10  CREATININE 0.84 0.94  CALCIUM 9.1 9.1   CBC:  Recent Labs Lab 06/01/15 1459  WBC 7.3  NEUTROABS 5.5  HGB 12.9  HCT 37.8  MCV 89.8  PLT 151   Coagulation:  Recent Labs Lab 06/01/15 1459  LABPROT 15.9*  INR 1.26    IMAGING  Dg Chest 2 View 06/01/2015    No active  cardiopulmonary disease.    Ct Head Wo Contrast 06/01/2015    Subtle edema within the left frontal lobe is suspicious for acute or subacute MCA distribution infarct.       Mr Dawn Escobar Head/brain Wo Cm 06/01/2015     MRI HEAD  1. Early subacute acute left MCA territory cortical infarct as above. There is associated petechial hemorrhage without hemorrhagic transformation. No significant mass effect.  2. Faint hyperintense signal intensity on DWI sequence within the deep white matter of the high posterior left frontal lobe. While this finding may be related to chronic small vessel ischemic changes, this may also reflect sequela of subacute ischemia.   MRA HEAD   1. Negative intracranial MRA. No large or proximal arterial branch occlusion. No hemodynamically significant or correctable stenosis. 2. Diminutive right vertebral artery terminates in PICA.         Physical exam: Exam: Gen: NAD Eyes: anicteric sclerae, moist conjunctivae  CV: no MRG, no carotid bruits, no peripheral edema Mental Status: Alert, follows commands, good historian, oriented to person, place, date, situation  Neuro: Detailed Neurologic Exam  Speech:  No aphasia, no dysarthria, mild speech apraxia  Cranial Nerves:  The pupils are equal, round, and reactive to light. Attempted, Fundi not visualized due  to small pupils. Visual fields intact to confrontation. EOMI. No gaze preference. Visual fields full. Mild lower right facial weakness. Tongue midline.uvula midline. Hearing intact to voice. Shoulder shrug intact  Motor Observation:  no involuntary movements noted. Tone appears normal.    Strength:  Mild right-sided hemiparesis with right pronator drift   Sensation: intact to LT  DTRs: 2+ throughout  Plantars downgoing.   Coordination: No dysmetria  Gait: Patient able to ambulate normally down the hallway with PT    ASSESSMENT/PLAN [name] is a 54 y.o. female  with history of atrial fibrillation on apixaban, Raynaud's disease, hyperlipidemia, previous stroke, peripheral neuropathy, hypertension, migraine headaches, and anxiety,  presenting with sudden onset of right facial and right arm weakness. She did not receive IV t-PA due to late presentation.  Stroke:  Dominant infarct probably embolic secondary to atrial fibrillation.  Resultant  mild right upper > lower extremity weakness with some  Mild speech apraxia  MRI - Early subacute acute left MCA territory cortical infarct  MRA  negative  Carotid Doppler - Bilateral: 1-39% ICA stenosis. Vertebral artery flow is antegrade.  2D Echo pending  LDL 185  HgbA1c pending  Eliquis for VTE prophylaxis  Diet Heart Room service appropriate?: Yes; Fluid consistency:: Thin  eliquis (apixaban) prior to admission, now on eliquis  Patient counseled to be compliant with her antithrombotic medications  Ongoing aggressive stroke risk factor management  Therapy recommendations:  Pending  Disposition:  Pending  Hypertension  Stable Permissive hypertension (OK if < 220/120) but gradually normalize in 5-7 days  Hyperlipidemia  Home meds:  Crestor 20 mg daily   LDL 185, goal < 70  Crestor changed to Lipitor 80 mg daily  Continue statin at discharge  Diabetes  HgbA1c pending, goal < 7.0  No previous history of diabetes mellitus  Other Stroke Risk Factors  Cigarette smoker, advised to stop smoking  ETOH use  Obesity, There is no weight on file to calculate BMI.   Hx stroke/TIA  Migraines   Atrial fibrillation   Other Active Problems    Hospital day # 1  Delton See PA-C [facility] Pager [phone] 06/02/2015, 12:39 PM   Personally examined patient and images, and have participated in and made any corrections needed to history, physical, neuro exam,assessment and plan as stated above. 54 year old female with a previous embolic left ACA infarct in  November 2015 with resultant right-sided weakness and numbness who was placed on Eliquis after paroxysmal atrial fibrillation was found a loop recorder. Patient endorses compliance with Eliquis however she continues to smoke and has multiple risk factors of hypertension, hyperlipidemia and is not compliant with her other medications. Situation is complicated by financial constraints and patient is living in a hotel. Need to continue Eliquis for secondary stroke prevention, and need to aggressively manage vascular risk factors and stop smoking.Continue Eliquis due to atrial fibrillation for secondary stroke prevention and maintain strict control of hypertension with blood pressure goal below 130/90, diabetes with hemoglobin A1c goal below 6.5% and lipids with LDL cholesterol goal below 70.   Naomie Dean, MD Stroke Neurology [phone] [facility]         To contact Stroke Continuity provider, please refer to [email or web address]. After hours, contact General Neurology

## 2015-06-02 NOTE — Progress Notes (Signed)
PROGRESS NOTE  Dawn Escobar ZOX:[id number] DOB: [birth year] DOA: 06/01/2015 PCP: Esperanza Richters, PA-C  Assessment/Plan: Acute CVA  MRI brain, MRA head + Says she has not missed eliquis dose but records from cardiology show she has not been complaint -still smoking -Check 2D echo, carotid doppler, A1C. lipid panel done this morning shows LDL: 185  - Lipitor 80 mg daily. - Cleared Swallow eval in the ED PT, OT. -Patient following with Dr. Pearlean Brownie as outpatient.  paroxysmal A. fib Detected on loop recorder. On eliquis   Generalized anxiety disorder Continue Klonopin and trazodone.  Peripheral neuropathy and muscle pains Continue Neurontin and Robaxin  Essential hypertension Allow permissive blood pressure. Not taking any medications.  Tobacco use Counseled on cessation. I will place her on nicotine patch.  Code Status: full Family Communication:  Disposition Plan:    Consultants:  neuro  Procedures:      HPI/Subjective: No SOB-- very anxious  Objective: Filed Vitals:   06/02/15 1042  BP: 154/65  Pulse: 63  Temp: 98.6 F (37 C)  Resp: 18    Intake/Output Summary (Last 24 hours) at 06/02/15 1238 Last data filed at 06/01/15 2100  Gross per 24 hour  Intake    840 ml  Output      0 ml  Net    840 ml   There were no vitals filed for this visit.  Exam:   General:  Awake, NAD  Cardiovascular: rrr  Respiratory: clear  Abdomen: +BS, soft  Musculoskeletal: no edema   Data Reviewed: Basic Metabolic Panel:  Recent Labs Lab 06/01/15 1208 06/01/15 1459  NA 137 138  K 4.7 4.8  CL 103 105  CO2 28 26  GLUCOSE 102* 111*  BUN 14 10  CREATININE 0.84 0.94  CALCIUM 9.1 9.1   Liver Function Tests:  Recent Labs Lab 06/01/15 1208 06/01/15 1459  AST 14 23  ALT 7 9*  ALKPHOS 63 65  BILITOT 0.4 0.5  PROT 7.1 6.9  ALBUMIN 4.1 4.0   No results for input(s): LIPASE, AMYLASE in the last 168 hours. No results for input(s): AMMONIA in the last  168 hours. CBC:  Recent Labs Lab 06/01/15 1459  WBC 7.3  NEUTROABS 5.5  HGB 12.9  HCT 37.8  MCV 89.8  PLT 151   Cardiac Enzymes: No results for input(s): CKTOTAL, CKMB, CKMBINDEX, TROPONINI in the last 168 hours. BNP (last 3 results) No results for input(s): BNP in the last 8760 hours.  ProBNP (last 3 results) No results for input(s): PROBNP in the last 8760 hours.  CBG: No results for input(s): GLUCAP in the last 168 hours.  No results found for this or any previous visit (from the past 240 hour(s)).   Studies: Dg Chest 2 View  06/01/2015   CLINICAL DATA:  STROKE, HTN, smoker. Hx A-fib and loop recorder implant  EXAM: CHEST  2 VIEW  COMPARISON:  08/31/2014  FINDINGS: Cardiac silhouette normal in size and configuration. Loop recorder device lies in left anterior chest wall. Normal mediastinal and hilar contours.  Clear lungs.  No pleural effusion or pneumothorax.  Bony thorax is intact.  IMPRESSION: No active cardiopulmonary disease.   Electronically Signed   By: Amie Portland M.D.   On: 06/01/2015 20:59   Ct Head Wo Contrast  06/01/2015   CLINICAL DATA:  Right-sided facial droop. Slurred speech for 1 week. Right upper extremity weakness and increased tiredness. History of stroke in 2015, November.  EXAM: CT HEAD WITHOUT CONTRAST  TECHNIQUE: Contiguous  axial images were obtained from the base of the skull through the vertex without intravenous contrast.  COMPARISON:  04/25/2015.  MRI of 09/01/2014.  FINDINGS: Sinuses/Soft tissues: Clear paranasal sinuses and mastoid air cells.  Intracranial: No hemorrhage, hydrocephalus, intra-axial, or extra-axial fluid collection. Subtle left frontal lobe hypoattenuation and subtle effacement of sulci. Example images 19 and 20 of series 3. No midline shift.  IMPRESSION: Subtle edema within the left frontal lobe is suspicious for acute or subacute MCA distribution infarct.  These results will be called to the ordering clinician or representative by  the Radiologist Assistant, and communication documented in the PACS or zVision Dashboard.   Electronically Signed   By: Jeronimo Greaves M.D.   On: 06/01/2015 12:36   Mr Brain Wo Contrast  06/01/2015   CLINICAL DATA:  Initial evaluation for acute right-sided weakness.  EXAM: MRI HEAD WITHOUT CONTRAST  MRA HEAD WITHOUT CONTRAST  TECHNIQUE: Multiplanar, multiecho pulse sequences of the brain and surrounding structures were obtained without intravenous contrast. Angiographic images of the head were obtained using MRA technique without contrast.  COMPARISON:  Prior CT from earlier the same day.  FINDINGS: MRI HEAD FINDINGS  There is patchy restricted diffusion involving predominantly the cortical gray matter of the posterior left frontal lobe extending into the posterior insular cortex, consistent with acute left MCA territory infarct. This appears to have somewhat normalized at this point on corresponding ADC map, consistent with early subacute ischemic infarct. There is associated petechial hemorrhage on SWI sequence without frank hemorrhagic transformation. Gyral swelling and edema without significant mass effect. Normal intravascular flow voids maintained. No other infarct. No other acute or chronic intracranial hemorrhage.  Cerebral volume within normal limits for patient age. Minimal T2/FLAIR hyperintensity within the periventricular white matter likely related to chronic small vessel ischemic disease. Similarly, subtle FLAIR signal intensity within the subcortical white matter of the high posterior left frontal lobe may be related to chronic small vessel ischemia, or possibly subacute ischemic changes, as the signal characteristics are are similar relative to the left MCA cortical infarct. No mass lesion, midline shift, or mass effect. No hydrocephalus. No extra-axial fluid collection.  Craniocervical junction normal. Mild degenerative changes present within the visualized upper cervical spine without significant  stenosis.  Incidental note made of a partially empty sella. Pituitary gland otherwise unremarkable.  No acute abnormality about the orbits. Sequelae of bilateral cataract extraction noted.  Mild mucosal thickening within the ethmoidal air cells. Paranasal sinuses are otherwise clear. No mastoid effusion. Inner ear structures grossly normal.  Bone marrow signal intensity within normal limits. Scalp soft tissues unremarkable.  MRA HEAD FINDINGS  ANTERIOR CIRCULATION:  Visualized distal cervical segments of the internal carotid arteries are widely patent with antegrade flow. The petrous, cavernous, and supraclinoid segments are widely patent. A1 segments, anterior communicating artery, and anterior cerebral arteries are well opacified.  M1 segments widely patent without stenosis or occlusion. MCA bifurcations normal. Distal MCA branches fairly symmetric bilaterally without definite proximal M2 branch occlusion. No focal stenosis  POSTERIOR CIRCULATION:  Left vertebral artery is dominant and widely patent to the vertebrobasilar junction. Diminutive right vertebral artery terminates in the right posterior inferior cerebral artery. Posterior inferior cerebral arteries patent bilaterally. Basilar artery well opacified. Superior cerebellar arteries widely patent. P1 segment mildly hypoplastic with prominent posterior communicating arteries bilaterally. Posterior cerebral arteries well opacified to their distal aspects.  No aneurysm or vascular malformation.  IMPRESSION: MRI HEAD IMPRESSION:  1. Early subacute acute left MCA territory cortical infarct  as above. There is associated petechial hemorrhage without hemorrhagic transformation. No significant mass effect. 2. Faint hyperintense signal intensity on DWI sequence within the deep white matter of the high posterior left frontal lobe. While this finding may be related to chronic small vessel ischemic changes, this may also reflect sequela of subacute ischemia.  MRA HEAD  IMPRESSION:  1. Negative intracranial MRA. No large or proximal arterial branch occlusion. No hemodynamically significant or correctable stenosis. 2. Diminutive right vertebral artery terminates in PICA.   Electronically Signed   By: Rise Mu M.D.   On: 06/01/2015 22:07   Mr Maxine Glenn Head/brain Wo Cm  06/01/2015   CLINICAL DATA:  Initial evaluation for acute right-sided weakness.  EXAM: MRI HEAD WITHOUT CONTRAST  MRA HEAD WITHOUT CONTRAST  TECHNIQUE: Multiplanar, multiecho pulse sequences of the brain and surrounding structures were obtained without intravenous contrast. Angiographic images of the head were obtained using MRA technique without contrast.  COMPARISON:  Prior CT from earlier the same day.  FINDINGS: MRI HEAD FINDINGS  There is patchy restricted diffusion involving predominantly the cortical gray matter of the posterior left frontal lobe extending into the posterior insular cortex, consistent with acute left MCA territory infarct. This appears to have somewhat normalized at this point on corresponding ADC map, consistent with early subacute ischemic infarct. There is associated petechial hemorrhage on SWI sequence without frank hemorrhagic transformation. Gyral swelling and edema without significant mass effect. Normal intravascular flow voids maintained. No other infarct. No other acute or chronic intracranial hemorrhage.  Cerebral volume within normal limits for patient age. Minimal T2/FLAIR hyperintensity within the periventricular white matter likely related to chronic small vessel ischemic disease. Similarly, subtle FLAIR signal intensity within the subcortical white matter of the high posterior left frontal lobe may be related to chronic small vessel ischemia, or possibly subacute ischemic changes, as the signal characteristics are are similar relative to the left MCA cortical infarct. No mass lesion, midline shift, or mass effect. No hydrocephalus. No extra-axial fluid collection.   Craniocervical junction normal. Mild degenerative changes present within the visualized upper cervical spine without significant stenosis.  Incidental note made of a partially empty sella. Pituitary gland otherwise unremarkable.  No acute abnormality about the orbits. Sequelae of bilateral cataract extraction noted.  Mild mucosal thickening within the ethmoidal air cells. Paranasal sinuses are otherwise clear. No mastoid effusion. Inner ear structures grossly normal.  Bone marrow signal intensity within normal limits. Scalp soft tissues unremarkable.  MRA HEAD FINDINGS  ANTERIOR CIRCULATION:  Visualized distal cervical segments of the internal carotid arteries are widely patent with antegrade flow. The petrous, cavernous, and supraclinoid segments are widely patent. A1 segments, anterior communicating artery, and anterior cerebral arteries are well opacified.  M1 segments widely patent without stenosis or occlusion. MCA bifurcations normal. Distal MCA branches fairly symmetric bilaterally without definite proximal M2 branch occlusion. No focal stenosis  POSTERIOR CIRCULATION:  Left vertebral artery is dominant and widely patent to the vertebrobasilar junction. Diminutive right vertebral artery terminates in the right posterior inferior cerebral artery. Posterior inferior cerebral arteries patent bilaterally. Basilar artery well opacified. Superior cerebellar arteries widely patent. P1 segment mildly hypoplastic with prominent posterior communicating arteries bilaterally. Posterior cerebral arteries well opacified to their distal aspects.  No aneurysm or vascular malformation.  IMPRESSION: MRI HEAD IMPRESSION:  1. Early subacute acute left MCA territory cortical infarct as above. There is associated petechial hemorrhage without hemorrhagic transformation. No significant mass effect. 2. Faint hyperintense signal intensity on DWI sequence within the  deep white matter of the high posterior left frontal lobe. While this  finding may be related to chronic small vessel ischemic changes, this may also reflect sequela of subacute ischemia.  MRA HEAD IMPRESSION:  1. Negative intracranial MRA. No large or proximal arterial branch occlusion. No hemodynamically significant or correctable stenosis. 2. Diminutive right vertebral artery terminates in PICA.   Electronically Signed   By: Rise Mu M.D.   On: 06/01/2015 22:07    Scheduled Meds: . amitriptyline  25 mg Oral Daily  . apixaban  5 mg Oral BID  . aspirin  325 mg Oral Daily  . atorvastatin  80 mg Oral q1800  . gabapentin  100 mg Oral TID  . methocarbamol  500 mg Oral QHS  . nicotine  14 mg Transdermal Daily  . pantoprazole  40 mg Oral Daily  . traZODone  100 mg Oral QHS   Continuous Infusions:  Antibiotics Given (last 72 hours)    None      Active Problems:   Hypercholesterolemia   Tobacco abuse   Acute CVA (cerebrovascular accident)   Atrial fibrillation   Generalized anxiety disorder   History of depression   GERD (gastroesophageal reflux disease)    Time spent: 25 min    Cristela Stalder  [facility] Pager [phone]. If 7PM-7AM, please contact night-coverage at [email or web address], password [facility] 06/02/2015, 12:38 PM  LOS: 1 day

## 2015-06-02 NOTE — Progress Notes (Signed)
Utilization review completed.  

## 2015-06-02 NOTE — Evaluation (Signed)
Physical Therapy Evaluation Patient Details Name: Dawn Escobar MRN: [id number] DOB: [birth year] Today's Date: 06/02/2015   History of Present Illness  Patient is a 54 yo engaged female with Rt sided weakness. She sustained a Lt sided MCA infarct.  PMH also includes prior CVA with near resolution of symtoms  Clinical Impression  Patient's discharge disposition dependent on rate of progress in regaining strength and balance. Patient independent ambulator on unit with distant staff supervision. Will further assess balance next 1-2 sessions. Will continue to follow patient while on this venue of care to progress mobility. Session today shortened secondary to medical testing.     Follow Up Recommendations No PT follow up;Outpatient PT    Equipment Recommendations  None recommended by PT    Recommendations for Other Services       Precautions / Restrictions Precautions Precautions: None Restrictions Weight Bearing Restrictions: No      Mobility  Bed Mobility Overal bed mobility: Independent             General bed mobility comments: without rail  Transfers Overall transfer level: Independent Equipment used: None             General transfer comment: good transfer safety  Ambulation/Gait Ambulation/Gait assistance: Supervision Ambulation Distance (Feet): 200 Feet Assistive device: 1 person hand held assist Gait Pattern/deviations: Decreased dorsiflexion - right;Decreased step length - right     General Gait Details: reports heavy feeling in foot  Stairs            Wheelchair Mobility    Modified Rankin (Stroke Patients Only) Modified Rankin (Stroke Patients Only) Pre-Morbid Rankin Score: No significant disability Modified Rankin: Slight disability     Balance Overall balance assessment: No apparent balance deficits (not formally assessed)                                           Pertinent Vitals/Pain Pain Assessment:  No/denies pain    Home Living Family/patient expects to be discharged to:: Other (Comment)                 Additional Comments: lives in hotel since last year, moved from [place] and reports she did not have opportunity to seek permanent housing before her initial stroke    Prior Function Level of Independence: Independent         Comments: near full resolution from prior CVA (has PT/OT/ST home services)     Hand Dominance   Dominant Hand: Left    Extremity/Trunk Assessment   Upper Extremity Assessment: Generalized weakness (partial range flexion and abduct)           Lower Extremity Assessment: RLE deficits/detail RLE Deficits / Details: 3+/5 hip flexion, 4-/5 knee ext, 4/5 ankle DF    Cervical / Trunk Assessment: Normal  Communication   Communication: Expressive difficulties;Other (comment) (slurred speech)  Cognition Arousal/Alertness: Awake/alert Behavior During Therapy: WFL for tasks assessed/performed Overall Cognitive Status: Within Functional Limits for tasks assessed                      General Comments      Exercises        Assessment/Plan    PT Assessment Patient needs continued PT services  PT Diagnosis Hemiplegia non-dominant side   PT Problem List Decreased strength;Decreased activity tolerance;Decreased balance;Decreased mobility;Impaired sensation  PT Treatment Interventions Gait training;Functional mobility training;Therapeutic  activities;Therapeutic exercise;Balance training;Neuromuscular re-education;Patient/family education   PT Goals (Current goals can be found in the Care Plan section) Acute Rehab PT Goals Patient Stated Goal: regain my strength PT Goal Formulation: With patient Time For Goal Achievement: 06/09/15 Potential to Achieve Goals: Good    Frequency Min 4X/week   Barriers to discharge   primary level of hotel    Co-evaluation               End of Session Equipment Utilized During Treatment: Gait  belt Activity Tolerance: Patient tolerated treatment well Patient left: in bed;with call bell/phone within reach;with nursing/sitter in room Nurse Communication: Mobility status         Time: 1610-9604 PT Time Calculation (min) (ACUTE ONLY): 20 min   Charges:         PT G CodesNestor Lewandowsky, PT [phone]  Tyrek Lawhorn 06/02/2015, 7:06 PM

## 2015-06-02 NOTE — Progress Notes (Signed)
*  PRELIMINARY RESULTS* Vascular Ultrasound Carotid Duplex (Doppler) has been completed.  Preliminary findings: Bilateral:  1-39% ICA stenosis.  Vertebral artery flow is antegrade.      Farrel Demark, RDMS, RVT  06/02/2015, 11:01 AM

## 2015-06-03 DIAGNOSIS — I63522 Cerebral infarction due to unspecified occlusion or stenosis of left anterior cerebral artery: Secondary | ICD-10-CM | POA: Diagnosis not present

## 2015-06-03 MED ORDER — NICOTINE 14 MG/24HR TD PT24: 14.0000 mg | MEDICATED_PATCH | Freq: Every day | TRANSDERMAL | Status: DC

## 2015-06-03 MED ORDER — CLONAZEPAM 0.5 MG PO TABS: 0.5000 mg | ORAL_TABLET | Freq: Two times a day (BID) | ORAL | Status: DC | PRN

## 2015-06-03 MED ORDER — ATORVASTATIN CALCIUM 80 MG PO TABS: 80.0000 mg | ORAL_TABLET | Freq: Every day | ORAL | Status: DC

## 2015-06-03 NOTE — Progress Notes (Signed)
Patient given DC instructions, prescriptions and handouts. All questions answered. Patient in [facility] escorted to lobby by staff. S.O. to transport home in personal vehicle.

## 2015-06-03 NOTE — Progress Notes (Signed)
STROKE TEAM PROGRESS NOTE  HPI Miyah Hampshire is a 54 y.o. female with a history of atrial fibrillation on apixaban who was in her normal state of health until one and a half weeks ago when she had sudden onset right facial and right arm weakness. She saw her PCP today who ordered a head CT which showed a hypodensity in the left MCA distribution   LKW: 1.5 weeks ago tpa given?: no, out of window    SUBJECTIVE (INTERVAL HISTORY) No family members present. The patient continues to improve. She feels her main problem right now is anxiety. We will ask the medical service to address this issue.  OBJECTIVE Temp:  [97.7 F (36.5 C)-98.6 F (37 C)] 97.8 F (36.6 C) (08/28 0611) Pulse Rate:  [60-68] 62 (08/28 0611) Cardiac Rhythm:  [-] Normal sinus rhythm (08/27 2000) Resp:  [16-20] 20 (08/28 0611) BP: (114-156)/(59-73) 137/70 mmHg (08/28 0611) SpO2:  [97 %-100 %] 97 % (08/28 0611)     Component Value Date/Time   CHOL 243* 06/01/2015 1208   TRIG 72.0 06/01/2015 1208   HDL 43.40 06/01/2015 1208   CHOLHDL 6 06/01/2015 1208   VLDL 14.4 06/01/2015 1208   LDLCALC 185* 06/01/2015 1208   Lab Results  Component Value Date   HGBA1C 5.6 08/31/2014      Component Value Date/Time   LABOPIA NONE DETECTED 06/01/2015 1636   COCAINSCRNUR NONE DETECTED 06/01/2015 1636   LABBENZ NONE DETECTED 06/01/2015 1636   AMPHETMU NONE DETECTED 06/01/2015 1636   THCU NONE DETECTED 06/01/2015 1636   LABBARB NONE DETECTED 06/01/2015 1636     Basic Metabolic Panel:   Recent Labs Lab 06/01/15 1208 06/01/15 1459  NA 137 138  K 4.7 4.8  CL 103 105  CO2 28 26  GLUCOSE 102* 111*  BUN 14 10  CREATININE 0.84 0.94  CALCIUM 9.1 9.1   CBC:   Recent Labs Lab 06/01/15 1459  WBC 7.3  NEUTROABS 5.5  HGB 12.9  HCT 37.8  MCV 89.8  PLT 151   Coagulation:   Recent Labs Lab 06/01/15 1459  LABPROT 15.9*  INR 1.26    IMAGING  Dg Chest 2 View 06/01/2015    No active cardiopulmonary disease.     Ct Head Wo Contrast 06/01/2015    Subtle edema within the left frontal lobe is suspicious for acute or subacute MCA distribution infarct.       Mr Maxine Glenn Head/brain Wo Cm 06/01/2015     MRI HEAD  1. Early subacute acute left MCA territory cortical infarct as above. There is associated petechial hemorrhage without hemorrhagic transformation. No significant mass effect.  2. Faint hyperintense signal intensity on DWI sequence within the deep white matter of the high posterior left frontal lobe. While this finding may be related to chronic small vessel ischemic changes, this may also reflect sequela of subacute ischemia.   MRA HEAD   1. Negative intracranial MRA. No large or proximal arterial branch occlusion. No hemodynamically significant or correctable stenosis. 2. Diminutive right vertebral artery terminates in PICA.        2-D echocardiogram 06/02/2015 Study Conclusions - Left ventricle: The cavity size was normal. Wall thickness was increased in a pattern of mild LVH. Systolic function was normal. The estimated ejection fraction was in the range of 55% to 60%. Wall motion was normal; there were no regional wall motion abnormalities. Doppler parameters are consistent with abnormal left ventricular relaxation (grade 1 diastolic dysfunction). - Aortic valve: There was mild to  moderate regurgitation. - Mitral valve: There was mild regurgitation.    Physical exam: Exam: Gen: NAD Eyes: anicteric sclerae, moist conjunctivae  CV: no MRG, no carotid bruits, no peripheral edema Mental Status: Alert, follows commands, good historian, oriented to person, place, date, situation  Neuro: Detailed Neurologic Exam  Speech:  No aphasia, no dysarthria, mild speech apraxia  Cranial Nerves:  The pupils are equal, round, and reactive to light. Attempted, Fundi not visualized due to small pupils. Visual fields intact to confrontation. EOMI. No gaze  preference. Visual fields full. Mild lower right facial weakness. Tongue midline.uvula midline. Hearing intact to voice. Shoulder shrug intact  Motor Observation:  no involuntary movements noted. Tone appears normal.    Strength:  Mild right-sided hemiparesis with right pronator drift   Sensation: intact to LT  DTRs: 2+ throughout  Plantars downgoing.   Coordination: No dysmetria  Gait: Patient able to ambulate normally down the hallway with PT    ASSESSMENT/PLAN Ms. Kolbie Clarkston is a 54 y.o. female with history of atrial fibrillation on apixaban, Raynaud's disease, hyperlipidemia, previous stroke, peripheral neuropathy, hypertension, migraine headaches, and anxiety,  presenting with sudden onset of right facial and right arm weakness. She did not receive IV t-PA due to late presentation.  Stroke:  Dominant infarct probably embolic secondary to atrial fibrillation.  Resultant  mild right upper > lower extremity weakness with some  Mild speech apraxia  MRI - Early subacute acute left MCA territory cortical infarct  MRA  negative  Carotid Doppler - Bilateral: 1-39% ICA stenosis. Vertebral artery flow is antegrade.  2D Echo - EF 55 - 60 %. No cardiac source of emboli identified.  LDL 185  HgbA1c pending  Eliquis for VTE prophylaxis Diet Heart Room service appropriate?: Yes; Fluid consistency:: Thin  eliquis (apixaban) prior to admission, now on eliquis  Patient counseled to be compliant with her antithrombotic medications  Ongoing aggressive stroke risk factor management  Therapy recommendations:  No physical therapy follow-up recommended.  Disposition:  Pending  Hypertension  Stable Permissive hypertension (OK if < 220/120) but gradually normalize in 5-7 days  Hyperlipidemia  Home meds:  Crestor 20 mg daily   LDL 185, goal < 70  Crestor changed to Lipitor 80 mg daily  Continue statin at discharge  Diabetes  HgbA1c pending, goal <  7.0  No previous history of diabetes mellitus  Other Stroke Risk Factors  Cigarette smoker, advised to stop smoking  ETOH use  Obesity, There is no weight on file to calculate BMI.   Hx stroke/TIA  Migraines   Atrial fibrillation   PLAN  The stroke team will sign off at this time. Please call if we can be of further service.  Follow-up with Dr. Pearlean Brownie in 2 months.  Hospital day # 2  Delton See PA-C [facility] Pager [phone] 06/03/2015, 8:45 AM   Personally examined patient and images, and have participated in and made any corrections needed to history, physical, neuro exam,assessment and plan as stated above. 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 Patient endorses compliance with Eliquis however she continues to smoke and has multiple risk factors of hypertension, hyperlipidemia and is not compliant with her other medications. Situation is complicated by financial constraints and patient is living in a hotel. Need to continue Eliquis for secondary stroke prevention, and need to  aggressively manage vascular risk factors and stop smoking.Continue Eliquis due to atrial fibrillation for secondary stroke prevention and maintain strict control of hypertension with blood pressure goal below 130/90, diabetes with hemoglobin A1c goal below 6.5% and lipids with LDL cholesterol goal below 70.            To contact Stroke Continuity provider, please refer to [email or web address]. After hours, contact General Neurology

## 2015-06-03 NOTE — Discharge Summary (Signed)
Physician Discharge Summary  Dawn Escobar ZOX:[id number] DOB: [birth year] DOA: 06/01/2015  PCP: Esperanza Richters, PA-C  Admit date: 06/01/2015 Discharge date: 06/03/2015  Time spent: 35 minutes  Recommendations for Outpatient Follow-up:  1. Stop smoking 2. Compliance with meds 3. Follow up on HgbA1C- pending at time of d/c 4. Outpatient PT 5. Outpatient psych referral for anxiety  Discharge Diagnoses:  Active Problems:   Hypercholesterolemia   Tobacco abuse   Acute CVA (cerebrovascular accident)   Atrial fibrillation   Generalized anxiety disorder   History of depression   GERD (gastroesophageal reflux disease)   Discharge Condition: improved  Diet recommendation: cardiac  There were no vitals filed for this visit.  History of present illness:  54 year old female with history of renal disease, hypertension, anxiety, ongoing tobacco use, GERD with hospitalization in November 2015 for embolic left anterior cerebral artery infarct. She had extensive evaluation for the source of embolism which was negative. She had a loop recorder inserted and was discharged home and a few months later it showed paroxysmal A. fib. She was then started on Eliquis which she has been tolerating and compliant with but due to financial reasons she was not able to afford any other medications and stopped all her other medications. She reports having some right sided weakness following the stroke which resolved after a few months. She saw her new PCP about a month back for neck pain and upper back pain and some occasional numbness in her right arm and she was referred to neurologist Dr. Pearlean Brownie as a follow-up. She saw Dr. Pearlean Brownie one month back as a follow-up and recommended to continue Eliquis. Also seen was taken off amlodipine with concerned that it may drop her blood pressure too low. About one and half week back patient had acute onset of slurred speech with right facial droop and right upper extremity weakness  for which she called 911. When EMS arrived they check her blood pressure and it was 180/120 mmHg. She told the EMS that she did not want to come to the hospital unless she had severe symptoms. She reports that the EMS personnel told her that her symptoms did not appear to be related to stroke and to follow-up with her PCP. Patient reports that she had ongoing right upper extremity weakness, slurred speech with word finding difficulty off and on, with facial droop noticed by her fianc. She also reports some numbness in her right arm. Her fianc also noticed that she had some unsteady gait. Patient denies headache, dizziness, fever, chills, nausea , vomiting, chest pain, palpitations, SOB, abdominal pain, bowel or urinary symptoms. Denies change in weight or appetite. Patient continues to smoke half a pack of cigarettes per day. She denies use of alcohol or illicit drug use.  Patient went to see her PCP as a follow up today. Her blood pressure was mildly elevated. Given her symptoms of off-and-on right facial droop and right-sided weakness, he ordered a head CT which showed acute versus subacute infarct over left anterior lobe she was referred to [facility] for further workup.  In the ED patient's vitals were stable. She cleared bedside swallow evaluation. Blood work done showed normal CBC and complains of metabolic panel. Head CT did results as outlined above. LDL checked at PCP office showed last 12 of 243 and LDL of 185.   Hospital Course:  Acute CVA MRI brain, MRA head + Says she has not missed eliquis dose but records from cardiology show she has not been compliant -  still smoking - 2D echo done carotid doppler Preliminary findings: Bilateral: 1-39% ICA stenosis. Vertebral artery flow is antegrade.   A1C pending. lipid panel done this morning shows LDL: 185  - Lipitor 80 mg daily. - Cleared Swallow eval in the ED PT, OT- outpatient -Patient following with Dr. Pearlean Brownie as  outpatient.  paroxysmal A. fib Detected on loop recorder. On eliquis   Generalized anxiety disorder Continue Klonopin and trazodone.  Peripheral neuropathy and muscle pains Continue Neurontin and Robaxin  Essential hypertension Allow permissive blood pressure. Not taking any medications.  Tobacco use Counseled on cessation. I will place her on nicotine patch  Procedures:    Consultations:  neuro  Discharge Exam: Filed Vitals:   06/03/15 1024  BP: 134/55  Pulse: 61  Temp: 97.9 F (36.6 C)  Resp: 17    General: awake, NAD   Discharge Instructions   Discharge Instructions    Ambulatory referral to Neurology    Complete by:  As directed   Dr. Pearlean Brownie requests follow up for this patient in 2 months.     Diet - low sodium heart healthy    Complete by:  As directed      Discharge instructions    Complete by:  As directed   Outpatient PT referral Follow up with Dr. Pearlean Brownie (neurology) Stop smoking     Increase activity slowly    Complete by:  As directed           Current Discharge Medication List    START taking these medications   Details  atorvastatin (LIPITOR) 80 MG tablet Take 1 tablet (80 mg total) by mouth daily at 6 PM. Qty: 30 tablet, Refills: 0    nicotine (NICODERM CQ - DOSED IN MG/24 HOURS) 14 mg/24hr patch Place 1 patch (14 mg total) onto the skin daily. Qty: 28 patch, Refills: 0      CONTINUE these medications which have CHANGED   Details  clonazePAM (KLONOPIN) 0.5 MG tablet Take 1 tablet (0.5 mg total) by mouth 2 (two) times daily as needed (anxiety). Qty: 14 tablet, Refills: 0      CONTINUE these medications which have NOT CHANGED   Details  acetaminophen (TYLENOL) 500 MG tablet Take 500 mg by mouth every 6 (six) hours as needed for mild pain, moderate pain or headache.    amitriptyline (ELAVIL) 25 MG tablet Take 1 tablet (25 mg total) by mouth daily. Qty: 90 tablet, Refills: 3    apixaban (ELIQUIS) 5 MG TABS tablet Take 1 tablet  (5 mg total) by mouth 2 (two) times daily. Qty: 60 tablet, Refills: 11    esomeprazole (NEXIUM) 40 MG capsule Take 1 capsule (40 mg total) by mouth daily. Qty: 30 capsule, Refills: 3    gabapentin (NEURONTIN) 100 MG capsule TAKE 1 CAPSULE (100 MG TOTAL) BY MOUTH 3 (THREE) TIMES DAILY. Qty: 90 capsule, Refills: 1    methocarbamol (ROBAXIN) 500 MG tablet 1 TABLET AT BEDTIME AS NEEDED FOR MUSCLE SPASMS Qty: 15 tablet, Refills: 1    traZODone (DESYREL) 100 MG tablet TAKE 1 TABLET (100 MG TOTAL) BY MOUTH AT BEDTIME. Qty: 30 tablet, Refills: 1      STOP taking these medications     doxazosin (CARDURA) 8 MG tablet      amLODipine (NORVASC) 5 MG tablet      rosuvastatin (CRESTOR) 20 MG tablet        Allergies  Allergen Reactions  . Morphine And Related Hives  . Tape Rash  No tape at all   Follow-up Information    Follow up with Saguier, Ramon Dredge, PA-C In 1 week.   Specialty:  Physician Assistant   Contact information:   [address] [place] [address] [phone]       Follow up with SETHI,PRAMOD, MD. Schedule an appointment as soon as possible for a visit in 2 months.   Specialties:  Neurology, Radiology   Contact information:   [address] [place] [address] [phone]        The results of significant diagnostics from this hospitalization (including imaging, microbiology, ancillary and laboratory) are listed below for reference.    Significant Diagnostic Studies: Dg Chest 2 View  06/01/2015   CLINICAL DATA:  STROKE, HTN, smoker. Hx A-fib and loop recorder implant  EXAM: CHEST  2 VIEW  COMPARISON:  08/31/2014  FINDINGS: Cardiac silhouette normal in size and configuration. Loop recorder device lies in left anterior chest wall. Normal mediastinal and hilar contours.  Clear lungs.  No pleural effusion or pneumothorax.  Bony thorax is intact.  IMPRESSION: No active cardiopulmonary disease.   Electronically Signed   By: Amie Portland  M.D.   On: 06/01/2015 20:59   Ct Head Wo Contrast  06/01/2015   CLINICAL DATA:  Right-sided facial droop. Slurred speech for 1 week. Right upper extremity weakness and increased tiredness. History of stroke in 2015, November.  EXAM: CT HEAD WITHOUT CONTRAST  TECHNIQUE: Contiguous axial images were obtained from the base of the skull through the vertex without intravenous contrast.  COMPARISON:  04/25/2015.  MRI of 09/01/2014.  FINDINGS: Sinuses/Soft tissues: Clear paranasal sinuses and mastoid air cells.  Intracranial: No hemorrhage, hydrocephalus, intra-axial, or extra-axial fluid collection. Subtle left frontal lobe hypoattenuation and subtle effacement of sulci. Example images 19 and 20 of series 3. No midline shift.  IMPRESSION: Subtle edema within the left frontal lobe is suspicious for acute or subacute MCA distribution infarct.  These results will be called to the ordering clinician or representative by the Radiologist Assistant, and communication documented in the PACS or zVision Dashboard.   Electronically Signed   By: Jeronimo Greaves M.D.   On: 06/01/2015 12:36   Mr Brain Wo Contrast  06/01/2015   CLINICAL DATA:  Initial evaluation for acute right-sided weakness.  EXAM: MRI HEAD WITHOUT CONTRAST  MRA HEAD WITHOUT CONTRAST  TECHNIQUE: Multiplanar, multiecho pulse sequences of the brain and surrounding structures were obtained without intravenous contrast. Angiographic images of the head were obtained using MRA technique without contrast.  COMPARISON:  Prior CT from earlier the same day.  FINDINGS: MRI HEAD FINDINGS  There is patchy restricted diffusion involving predominantly the cortical gray matter of the posterior left frontal lobe extending into the posterior insular cortex, consistent with acute left MCA territory infarct. This appears to have somewhat normalized at this point on corresponding ADC map, consistent with early subacute ischemic infarct. There is associated petechial hemorrhage on SWI  sequence without frank hemorrhagic transformation. Gyral swelling and edema without significant mass effect. Normal intravascular flow voids maintained. No other infarct. No other acute or chronic intracranial hemorrhage.  Cerebral volume within normal limits for patient age. Minimal T2/FLAIR hyperintensity within the periventricular white matter likely related to chronic small vessel ischemic disease. Similarly, subtle FLAIR signal intensity within the subcortical white matter of the high posterior left frontal lobe may be related to chronic small vessel ischemia, or possibly subacute ischemic changes, as the signal characteristics are are similar  relative to the left MCA cortical infarct. No mass lesion, midline shift, or mass effect. No hydrocephalus. No extra-axial fluid collection.  Craniocervical junction normal. Mild degenerative changes present within the visualized upper cervical spine without significant stenosis.  Incidental note made of a partially empty sella. Pituitary gland otherwise unremarkable.  No acute abnormality about the orbits. Sequelae of bilateral cataract extraction noted.  Mild mucosal thickening within the ethmoidal air cells. Paranasal sinuses are otherwise clear. No mastoid effusion. Inner ear structures grossly normal.  Bone marrow signal intensity within normal limits. Scalp soft tissues unremarkable.  MRA HEAD FINDINGS  ANTERIOR CIRCULATION:  Visualized distal cervical segments of the internal carotid arteries are widely patent with antegrade flow. The petrous, cavernous, and supraclinoid segments are widely patent. A1 segments, anterior communicating artery, and anterior cerebral arteries are well opacified.  M1 segments widely patent without stenosis or occlusion. MCA bifurcations normal. Distal MCA branches fairly symmetric bilaterally without definite proximal M2 branch occlusion. No focal stenosis  POSTERIOR CIRCULATION:  Left vertebral artery is dominant and widely patent to  the vertebrobasilar junction. Diminutive right vertebral artery terminates in the right posterior inferior cerebral artery. Posterior inferior cerebral arteries patent bilaterally. Basilar artery well opacified. Superior cerebellar arteries widely patent. P1 segment mildly hypoplastic with prominent posterior communicating arteries bilaterally. Posterior cerebral arteries well opacified to their distal aspects.  No aneurysm or vascular malformation.  IMPRESSION: MRI HEAD IMPRESSION:  1. Early subacute acute left MCA territory cortical infarct as above. There is associated petechial hemorrhage without hemorrhagic transformation. No significant mass effect. 2. Faint hyperintense signal intensity on DWI sequence within the deep white matter of the high posterior left frontal lobe. While this finding may be related to chronic small vessel ischemic changes, this may also reflect sequela of subacute ischemia.  MRA HEAD IMPRESSION:  1. Negative intracranial MRA. No large or proximal arterial branch occlusion. No hemodynamically significant or correctable stenosis. 2. Diminutive right vertebral artery terminates in PICA.   Electronically Signed   By: Rise Mu M.D.   On: 06/01/2015 22:07   Mr Maxine Glenn Head/brain Wo Cm  06/01/2015   CLINICAL DATA:  Initial evaluation for acute right-sided weakness.  EXAM: MRI HEAD WITHOUT CONTRAST  MRA HEAD WITHOUT CONTRAST  TECHNIQUE: Multiplanar, multiecho pulse sequences of the brain and surrounding structures were obtained without intravenous contrast. Angiographic images of the head were obtained using MRA technique without contrast.  COMPARISON:  Prior CT from earlier the same day.  FINDINGS: MRI HEAD FINDINGS  There is patchy restricted diffusion involving predominantly the cortical gray matter of the posterior left frontal lobe extending into the posterior insular cortex, consistent with acute left MCA territory infarct. This appears to have somewhat normalized at this point  on corresponding ADC map, consistent with early subacute ischemic infarct. There is associated petechial hemorrhage on SWI sequence without frank hemorrhagic transformation. Gyral swelling and edema without significant mass effect. Normal intravascular flow voids maintained. No other infarct. No other acute or chronic intracranial hemorrhage.  Cerebral volume within normal limits for patient age. Minimal T2/FLAIR hyperintensity within the periventricular white matter likely related to chronic small vessel ischemic disease. Similarly, subtle FLAIR signal intensity within the subcortical white matter of the high posterior left frontal lobe may be related to chronic small vessel ischemia, or possibly subacute ischemic changes, as the signal characteristics are are similar relative to the left MCA cortical infarct. No mass lesion, midline shift, or mass effect. No hydrocephalus. No extra-axial fluid collection.  Craniocervical junction  normal. Mild degenerative changes present within the visualized upper cervical spine without significant stenosis.  Incidental note made of a partially empty sella. Pituitary gland otherwise unremarkable.  No acute abnormality about the orbits. Sequelae of bilateral cataract extraction noted.  Mild mucosal thickening within the ethmoidal air cells. Paranasal sinuses are otherwise clear. No mastoid effusion. Inner ear structures grossly normal.  Bone marrow signal intensity within normal limits. Scalp soft tissues unremarkable.  MRA HEAD FINDINGS  ANTERIOR CIRCULATION:  Visualized distal cervical segments of the internal carotid arteries are widely patent with antegrade flow. The petrous, cavernous, and supraclinoid segments are widely patent. A1 segments, anterior communicating artery, and anterior cerebral arteries are well opacified.  M1 segments widely patent without stenosis or occlusion. MCA bifurcations normal. Distal MCA branches fairly symmetric bilaterally without definite  proximal M2 branch occlusion. No focal stenosis  POSTERIOR CIRCULATION:  Left vertebral artery is dominant and widely patent to the vertebrobasilar junction. Diminutive right vertebral artery terminates in the right posterior inferior cerebral artery. Posterior inferior cerebral arteries patent bilaterally. Basilar artery well opacified. Superior cerebellar arteries widely patent. P1 segment mildly hypoplastic with prominent posterior communicating arteries bilaterally. Posterior cerebral arteries well opacified to their distal aspects.  No aneurysm or vascular malformation.  IMPRESSION: MRI HEAD IMPRESSION:  1. Early subacute acute left MCA territory cortical infarct as above. There is associated petechial hemorrhage without hemorrhagic transformation. No significant mass effect. 2. Faint hyperintense signal intensity on DWI sequence within the deep white matter of the high posterior left frontal lobe. While this finding may be related to chronic small vessel ischemic changes, this may also reflect sequela of subacute ischemia.  MRA HEAD IMPRESSION:  1. Negative intracranial MRA. No large or proximal arterial branch occlusion. No hemodynamically significant or correctable stenosis. 2. Diminutive right vertebral artery terminates in PICA.   Electronically Signed   By: Rise Mu M.D.   On: 06/01/2015 22:07    Microbiology: No results found for this or any previous visit (from the past 240 hour(s)).   Labs: Basic Metabolic Panel:  Recent Labs Lab 06/01/15 1208 06/01/15 1459  NA 137 138  K 4.7 4.8  CL 103 105  CO2 28 26  GLUCOSE 102* 111*  BUN 14 10  CREATININE 0.84 0.94  CALCIUM 9.1 9.1   Liver Function Tests:  Recent Labs Lab 06/01/15 1208 06/01/15 1459  AST 14 23  ALT 7 9*  ALKPHOS 63 65  BILITOT 0.4 0.5  PROT 7.1 6.9  ALBUMIN 4.1 4.0   No results for input(s): LIPASE, AMYLASE in the last 168 hours. No results for input(s): AMMONIA in the last 168 hours. CBC:  Recent  Labs Lab 06/01/15 1459  WBC 7.3  NEUTROABS 5.5  HGB 12.9  HCT 37.8  MCV 89.8  PLT 151   Cardiac Enzymes: No results for input(s): CKTOTAL, CKMB, CKMBINDEX, TROPONINI in the last 168 hours. BNP: BNP (last 3 results) No results for input(s): BNP in the last 8760 hours.  ProBNP (last 3 results) No results for input(s): PROBNP in the last 8760 hours.  CBG: No results for input(s): GLUCAP in the last 168 hours.     SignedMarlin Canary  [facility] 06/03/2015, 1:22 PM

## 2015-06-03 NOTE — Progress Notes (Addendum)
Occupational Therapy Evaluation Patient Details Name: Dawn Escobar MRN: [id number] DOB: 1960/10/29 Today's Date: 06/03/2015    History of Present Illness Patient is a 54 yo engaged female with Rt sided weakness. She sustained a Lt sided MCA infarct.  PMH also includes prior CVA with near resolution of symtoms   Clinical Impression   Pt admitted with the above diagnoses and presents with below problem list. Pt will benefit from continued acute OT to address the below listed deficits and maximize independence with BADLs prior to d/c home. PTA pt was independent with ADLs.  Pt presents with impaired RUE strength and coordination, impaired dynamic standing balance with decreased activity tolerance impacting level of assist with ADLs. Pt also presents with c/o vision changes (see details below) and slurred speech with expressive difficulties at times. OT to continue to follow acutely.      Follow Up Recommendations  Supervision/Assistance - 24 hour;Outpatient OT    Equipment Recommendations  None recommended by OT    Recommendations for Other Services Speech consult; f/u with opthamologist     Precautions / Restrictions Precautions Precautions: None Restrictions Weight Bearing Restrictions: No      Mobility Bed Mobility               General bed mobility comments: in chair  Transfers Overall transfer level: Independent Equipment used: None             General transfer comment: hands on lap to stand from toilet    Balance Overall balance assessment: Needs assistance         Standing balance support: No upper extremity supported;During functional activity Standing balance-Leahy Scale: Good Standing balance comment: good initial static standing balance, fatigues during dynamic standing balance activities                            ADL Overall ADL's : Needs assistance/impaired Eating/Feeding: Set up;Sitting   Grooming: Minimal  assistance;Standing   Upper Body Bathing: Minimal assitance;Sitting   Lower Body Bathing: Min guard;Sit to/from stand;Minimal assistance   Upper Body Dressing : Minimal assistance;Set up;Sitting   Lower Body Dressing: Minimal assistance;Min guard;Sit to/from stand   Toilet Transfer: Min guard;Ambulation;Comfort height toilet   Toileting- Clothing Manipulation and Hygiene: Min guard;Sit to/from stand   Tub/ Shower Transfer: Min guard;Tub transfer;Ambulation;Shower seat   Functional mobility during ADLs: Min guard General ADL Comments: Pt presents with impaired RUE strength and coordination and impaired balance impacting level of assist with ADLs. Pt completed toilet transfer as detailed above. Pt then completed household distance ambulation at min guard level. Swaying and fatigue noted during functinal mobility. Pt reports feeling fatigued with decreased activity tolerance. Discussed using shower seat for bathing (pt has). Issued theraputty and [facility] HEP.     Vision Vision Assessment?: Vision impaired- to be further tested in functional context Additional Comments: Pt able to read name on therapist badge and time on wall. Pt reports some impaired vision in right eye reading name badge.    Perception     Praxis      Pertinent Vitals/Pain Pain Assessment: 0-10 Pain Score:  (constant for the past few days on right side of body) Pain Location: right side of body- hips, shoulder, neck,  Pain Descriptors / Indicators: Constant Pain Intervention(s): Monitored during session;Limited activity within patient's tolerance     Hand Dominance Left   Extremity/Trunk Assessment Upper Extremity Assessment Upper Extremity Assessment: RUE deficits/detail RUE Deficits / Details: 3+/5 grossly,  reports difficulty opening containers, fatigues easily RUE Coordination: decreased fine motor   Lower Extremity Assessment Lower Extremity Assessment: Defer to PT evaluation       Communication  Communication Communication: Other (comment);Expressive difficulties (slurred speech)   Cognition Arousal/Alertness: Awake/alert Behavior During Therapy: WFL for tasks assessed/performed Overall Cognitive Status: Within Functional Limits for tasks assessed                     General Comments       Exercises       Shoulder Instructions      Home Living Family/patient expects to be discharged to:: Other (Comment)                                 Additional Comments: lives in hotel since last year, moved from [place] and reports she did not have opportunity to seek permanent housing before her initial stroke. Ground floor, tub shower. Has shower seat      Prior Functioning/Environment Level of Independence: Independent        Comments: near full resolution from prior CVA (has PT/OT/ST home services)    OT Diagnosis: Hemiplegia non-dominant side;Disturbance of vision   OT Problem List: Decreased strength;Decreased activity tolerance;Impaired balance (sitting and/or standing);Impaired vision/perception;Decreased coordination;Decreased knowledge of use of DME or AE;Decreased knowledge of precautions;Impaired UE functional use   OT Treatment/Interventions: Self-care/ADL training;Therapeutic exercise;Neuromuscular education;DME and/or AE instruction;Therapeutic activities;Patient/family education;Balance training    OT Goals(Current goals can be found in the care plan section) Acute Rehab OT Goals Patient Stated Goal: regain my strength OT Goal Formulation: With patient Time For Goal Achievement: 06/10/15 Potential to Achieve Goals: Good ADL Goals Pt Will Perform Grooming: standing;with modified independence Pt Will Perform Upper Body Dressing: with modified independence;sitting Pt Will Perform Lower Body Dressing: with modified independence;with adaptive equipment;sit to/from stand Pt/caregiver will Perform Home Exercise Program: Increased strength;Right Upper  extremity;With theraputty;With theraband;With written HEP provided  OT Frequency: Min 2X/week   Barriers to D/C:            Co-evaluation              End of Session    Activity Tolerance: Patient tolerated treatment well Patient left: in chair;with call bell/phone within reach;with family/visitor present   Time: 1610-9604 OT Time Calculation (min): 20 min Charges:  OT General Charges $OT Visit: 1 Procedure OT Evaluation $Initial OT Evaluation Tier I: 1 Procedure G-Codes:    Pilar Grammes Jun 11, 2015, 3:15 PM

## 2015-06-04 LAB — CUP PACEART REMOTE DEVICE CHECK: MDC IDC SESS DTM: 20160829092318

## 2015-06-04 LAB — HEMOGLOBIN A1C
HEMOGLOBIN A1C: 5.7 % — AB (ref 4.8–5.6)
MEAN PLASMA GLUCOSE: 117 mg/dL

## 2015-06-04 NOTE — Telephone Encounter (Signed)
Left message for pt to call back  °

## 2015-06-05 ENCOUNTER — Telehealth: Payer: Self-pay

## 2015-06-05 NOTE — Telephone Encounter (Signed)
Reason for admission:  Stroke  PCP: Esperanza Richters, PA-C  Admit date: 06/01/2015 Discharge date: 06/03/2015  Hospital follow up appointment:  06/07/15 at 10:30 am with Esperanza Richters, PA-C.    Recommendations for Outpatient Follow-up:  1. Stop smoking 2. Compliance with meds 3. Follow up on HgbA1C- pending at time of d/c 4. Outpatient PT 5. Outpatient psych referral for anxiety  Transition Care Management Follow-up Telephone Call  How have you been since you were released from the hospital? Tired, weakness in right arm, speech problem, pain back/neck/headache, difficulty swallowing    Do you understand why you were in the hospital? yes   Do you understand the discharge instructions? yes, discharge instructions reviewed with patient.  Items Reviewed:  Medications reviewed: yes  Allergies reviewed: yes  Dietary changes reviewed: yes, heart healthy low sodium; pt states that she is not sure what she should be eating.   Referrals reviewed: yes, has not heard back regarding PT and psych referral.     Functional Questionnaire:   Activities of Daily Living (ADLs):   She states they are independent in the following: ambulation, bathing and hygiene, feeding, continence, grooming, toileting, dressing and Pt states it just takes her a long time to complete these things.   States they require assistance with the following: needs assistance with opening things with right hand.     Any transportation issues/concerns?: no   Any patient concerns? yes, unable to afford nicotine patches.  Not covered by insurance.  Pt is also having to pay out of pocket for Lipitor.  She still does not have a blood pressure machine.     Confirmed importance and date/time of follow-up visits scheduled: yes   Confirmed with patient if condition begins to worsen call PCP or go to the ER: yes

## 2015-06-06 ENCOUNTER — Encounter: Payer: Self-pay | Admitting: Neurology

## 2015-06-06 ENCOUNTER — Ambulatory Visit (INDEPENDENT_AMBULATORY_CARE_PROVIDER_SITE_OTHER): Payer: Medicare Other | Admitting: Neurology

## 2015-06-06 VITALS — BP 105/60 | HR 76 | Ht 65.0 in | Wt 182.2 lb

## 2015-06-06 DIAGNOSIS — I6340 Cerebral infarction due to embolism of unspecified cerebral artery: Secondary | ICD-10-CM

## 2015-06-06 DIAGNOSIS — I639 Cerebral infarction, unspecified: Secondary | ICD-10-CM

## 2015-06-06 MED ORDER — ROSUVASTATIN CALCIUM 40 MG PO TABS: 40.0000 mg | ORAL_TABLET | Freq: Every day | ORAL | Status: DC

## 2015-06-06 NOTE — Patient Instructions (Signed)
I had a long d/w patient and husband about his recent stroke, risk for recurrent stroke/TIAs, personally independently reviewed imaging studies and stroke evaluation results and answered questions.Continue Eliquis  for secondary stroke prevention and maintain strict control of hypertension with blood pressure goal below 130/90, diabetes with hemoglobin A1c goal below 6.5% and lipids with LDL cholesterol goal below 100 mg/dL. we talked about the lack of data about comparison of different new anti-coagulation medications to each other and superiority over each other. I also advised the patient to eat a healthy diet with plenty of whole grains, cereals, fruits and vegetables, exercise regularly and maintain ideal body weight .greater than 50% of time during this 25 minute visit were spent on counseling and coordination of care. Consider possible participation in the RESTORE rehabilitation trial if interested Followup in the future with me in 6 months or call earlier if necessary. Stroke Prevention Some medical conditions and behaviors are associated with an increased chance of having a stroke. You may prevent a stroke by making healthy choices and managing medical conditions. HOW CAN I REDUCE MY RISK OF HAVING A STROKE?   Stay physically active. Get at least 30 minutes of activity on most or all days.  Do not smoke. It may also be helpful to avoid exposure to secondhand smoke.  Limit alcohol use. Moderate alcohol use is considered to be:  No more than 2 drinks per day for men.  No more than 1 drink per day for nonpregnant women.  Eat healthy foods. This involves:  Eating 5 or more servings of fruits and vegetables a day.  Making dietary changes that address high blood pressure (hypertension), high cholesterol, diabetes, or obesity.  Manage your cholesterol levels.  Making food choices that are high in fiber and low in saturated fat, trans fat, and cholesterol may control cholesterol  levels.  Take any prescribed medicines to control cholesterol as directed by your health care provider.  Manage your diabetes.  Controlling your carbohydrate and sugar intake is recommended to manage diabetes.  Take any prescribed medicines to control diabetes as directed by your health care provider.  Control your hypertension.  Making food choices that are low in salt (sodium), saturated fat, trans fat, and cholesterol is recommended to manage hypertension.  Take any prescribed medicines to control hypertension as directed by your health care provider.  Maintain a healthy weight.  Reducing calorie intake and making food choices that are low in sodium, saturated fat, trans fat, and cholesterol are recommended to manage weight.  Stop drug abuse.  Avoid taking birth control pills.  Talk to your health care provider about the risks of taking birth control pills if you are over 75 years old, smoke, get migraines, or have ever had a blood clot.  Get evaluated for sleep disorders (sleep apnea).  Talk to your health care provider about getting a sleep evaluation if you snore a lot or have excessive sleepiness.  Take medicines only as directed by your health care provider.  For some people, aspirin or blood thinners (anticoagulants) are helpful in reducing the risk of forming abnormal blood clots that can lead to stroke. If you have the irregular heart rhythm of atrial fibrillation, you should be on a blood thinner unless there is a good reason you cannot take them.  Understand all your medicine instructions.  Make sure that other conditions (such as anemia or atherosclerosis) are addressed. SEEK IMMEDIATE MEDICAL CARE IF:   You have sudden weakness or numbness of the  face, arm, or leg, especially on one side of the body.  Your face or eyelid droops to one side.  You have sudden confusion.  You have trouble speaking (aphasia) or understanding.  You have sudden trouble seeing  in one or both eyes.  You have sudden trouble walking.  You have dizziness.  You have a loss of balance or coordination.  You have a sudden, severe headache with no known cause.  You have new chest pain or an irregular heartbeat. Any of these symptoms may represent a serious problem that is an emergency. Do not wait to see if the symptoms will go away. Get medical help at once. Call your local emergency services (911 in [place] Do not drive yourself to the hospital. Document Released: 10/30/2004 Document Revised: 02/06/2014 Document Reviewed: 03/25/2013 [facility] Patient Information 2015 [place], [place]. This information is not intended to replace advice given to you by your health care provider. Make sure you discuss any questions you have with your health care provider.

## 2015-06-06 NOTE — Progress Notes (Signed)
[facility] [address] [place]. [place] [address] [phone]       OFFICE FOLLOW-UP NOTE  Dawn. Quaneisha Hanisch Date of Birth:  1960-12-20 Medical Record Number:  [id number]   HPI: Dawn Escobar is a 65 Caucasian lady who is referred by Dr. Willa Rough from the emergency room for urgent neurological evaluation. 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 At that time she underwent extensive evaluation for source of embolism which was negative. However she had loop recorder inserted which a few months later showed paroxysmal atrial fibrillation. She has been on eliquis since then he is tolerating it well but she states that she did financial constraints was not able to get any of her other medications and stopped all her other medicines. For the last 1 month she's been having multifocal symptoms of right-sided headaches, muscle twitchings, dizziness, tremor and increased anxiety. She was taking clonazepam when necessary as well as admission: 25 mg at night for anxiety which she clearly has not been taking for the last month or so. She is also noted some subjective worsening of her mild right-sided weakness from a prior stroke as well as numbness. She actually was supposed to see me for follow-up following a previous stroke but she missed one appointment and could not afford to make another appointment due to her finding medical bills. She denies any new focal weakness, double vision, vertigo. Update 06/06/2015 : She is seen urgently today upon last office visit 1 month ago as she was admitted to the hospital on 06/01/15 complaining of headache dizziness speech difficulty. MRI scan of the brain showed acute to subacute left frontal and insular cortex infarct despite being on chronic anticoagulation with eliquis. Carotid ultrasound and transthoracic echo were unremarkable. Hemoglobin A1c was  5.7. Lipid profile showed total cholesterol 243, triglycerides 72, HDL 43 and elevated LDL 185 mg percent. She states that she has been compliant with the medication. She states her speech is improving though she still has some speech hesitancy and word finding difficulty. She is getting a home physical and speech therapy which is about to be completed. She is not clear if there are any plans for outpatient therapy. She complains of increased anxiety, tiredness, decreased appetite, back pain. She states her blood pressure is well controlled and today it is 105/60. She is tolerating eliquis well without bleeding or bruising. She remains on Lipitor and is tolerating it well. ROS:   14 system review of systems is positive for  speech difficulty, dizziness, weakness, headaches, anxiety, decreased appetite, tiredness, back pain, fatigue and all other systems negative PMH:  Past Medical History  Diagnosis Date  . PTSD (post-traumatic stress disorder)   . Raynaud's disease   . Anxiety   . Hypercholesteremia   . Stroke   . GERD (gastroesophageal reflux disease) 04/25/2015  . Hereditary and idiopathic peripheral neuropathy 04/25/2015  . Hypertension   . Atrial fibrillation   . Anxiety   . Migraine     Social History:  Social History   Social History  . Marital Status: Single    Spouse Name: N/A  . Number of Children: 0  . Years of Education: 12th   Occupational History  . n/a    Social History Main Topics  . Smoking status: Current Every Day Smoker -- 0.20 packs/day    Types: Cigarettes  . Smokeless tobacco: Never Used  . Alcohol Use: 0.0  oz/week    0 Standard drinks or equivalent per week  . Drug Use: No  . Sexual Activity: Not on file   Other Topics Concern  . Not on file   Social History Narrative   Patient lives at home alone.   Caffeine use: 1 cup of coffee and 20 oz of soda    Medications:   Current Outpatient Prescriptions on File Prior to Visit  Medication Sig Dispense  Refill  . acetaminophen (TYLENOL) 500 MG tablet Take 500 mg by mouth every 6 (six) hours as needed for mild pain, moderate pain or headache.    Marland Kitchen amitriptyline (ELAVIL) 25 MG tablet Take 1 tablet (25 mg total) by mouth daily. (Patient taking differently: Take 25 mg by mouth at bedtime. ) 90 tablet 3  . apixaban (ELIQUIS) 5 MG TABS tablet Take 1 tablet (5 mg total) by mouth 2 (two) times daily. 60 tablet 11  . clonazePAM (KLONOPIN) 0.5 MG tablet Take 1 tablet (0.5 mg total) by mouth 2 (two) times daily as needed (anxiety). 14 tablet 0  . esomeprazole (NEXIUM) 40 MG capsule Take 1 capsule (40 mg total) by mouth daily. 30 capsule 3  . gabapentin (NEURONTIN) 100 MG capsule TAKE 1 CAPSULE (100 MG TOTAL) BY MOUTH 3 (THREE) TIMES DAILY. 90 capsule 1  . methocarbamol (ROBAXIN) 500 MG tablet 1 TABLET AT BEDTIME AS NEEDED FOR MUSCLE SPASMS (Patient taking differently: TAKE 500 MG BY MOUTH DAILY AT BEDTIME) 15 tablet 1  . traZODone (DESYREL) 100 MG tablet TAKE 1 TABLET (100 MG TOTAL) BY MOUTH AT BEDTIME. 30 tablet 1   No current facility-administered medications on file prior to visit.    Allergies:   Allergies  Allergen Reactions  . Morphine And Related Hives  . Tape Rash    No tape at all    Physical Exam General: well developed, well nourished middle-aged lady, seated, in no evident distress Head: head normocephalic and atraumatic.  Neck: supple with no carotid or supraclavicular bruits Cardiovascular: regular rate and rhythm, no murmurs Musculoskeletal: no deformity Skin:  no rash/petichiae Vascular:  Normal pulses all extremities Filed Vitals:   06/06/15 1342  BP: 105/60  Pulse: 76   Neurologic Exam Mental Status: Awake and fully alert. Oriented to place and time. Recent and remote memory intact. Attention span, concentration and fund of knowledge appropriate. Mood and affect appropriate. Occasional word finding difficulties and nonfluent speech but good comprehension Cranial Nerves:  Fundoscopic exam reveals sharp disc margins. Pupils equal, briskly reactive to light. Extraocular movements full without nystagmus. Visual fields full to confrontation. Hearing intact. Facial sensation intact. Face, tongue, palate moves normally and symmetrically.  Motor: Normal bulk and tone. Normal strength in all tested extremity muscles. Mild right grip weakness. Diminished fine finger movements on the right. Orbits left over right approximate it. Mild weakness of right ankle dorsiflexors and hip flexors only. Sensory.: intact to touch ,pinprick .position and vibratory sensation. Except slight diminished sensation in the right upper extremity and right lower face.  Coordination: Rapid alternating movements normal in all extremities. Finger-to-nose and heel-to-shin performed accurately bilaterally. Gait and Station: Arises from chair without difficulty. Stance is normal. Gait demonstrates normal stride length and balance . Able to heel, toe and tandem walk without difficulty.  Reflexes: 1+ and symmetric. Toes downgoing.   NIHSS  1 Modified Rankin  1   ASSESSMENT: 51 year Caucasian lady with embolic left anterior cerebral artery infarct in November 2015 was subsequently found to have paroxysmal atrial fibrillation  on loop recorder a few months later. She has new multifocal complaints of headaches, twitchings, dizziness, tremors which may be related to recrudescence of old deficits with perhaps component of underlying anxiety  recent admission on 06/01/15 due to subacute left MCA infarct despite being on anticoagulation on eliquis. Multiple vascular risk factors of hypertension, hyperlipidemia, atrial fibrillation and  prior stroke    PLAN: I had a long d/w patient and husband about his recent stroke, risk for recurrent stroke/TIAs, personally independently reviewed imaging studies and stroke evaluation results and answered questions.Continue Eliquis  for secondary stroke prevention and maintain  strict control of hypertension with blood pressure goal below 130/90, diabetes with hemoglobin A1c goal below 6.5% and lipids with LDL cholesterol goal below 100 mg/dL. we talked about the lack of data about comparison of different new anti-coagulation medications to each other and superiority over each other. I also advised the patient to eat a healthy diet with plenty of whole grains, cereals, fruits and vegetables, exercise regularly and maintain ideal body weight .greater than 50% of time during this 25 minute visit were spent on counseling and coordination of care. Consider possible participation in the RESTORE rehabilitation trial if interested Followup in the future with me in 6 months or call earlier if necessary. Delia Heady, MD   Note: This document was prepared with digital dictation and possible smart phrase technology. Any transcriptional errors that result from this process are unintentional

## 2015-06-07 ENCOUNTER — Encounter: Payer: Self-pay | Admitting: Medical

## 2015-06-07 ENCOUNTER — Ambulatory Visit (INDEPENDENT_AMBULATORY_CARE_PROVIDER_SITE_OTHER): Payer: Medicare Other | Admitting: Medical

## 2015-06-07 VITALS — BP 120/70 | HR 90 | Temp 99.1°F | Ht 65.0 in | Wt 182.4 lb

## 2015-06-07 DIAGNOSIS — R29898 Other symptoms and signs involving the musculoskeletal system: Secondary | ICD-10-CM | POA: Diagnosis not present

## 2015-06-07 DIAGNOSIS — I10 Essential (primary) hypertension: Secondary | ICD-10-CM

## 2015-06-07 DIAGNOSIS — I6340 Cerebral infarction due to embolism of unspecified cerebral artery: Secondary | ICD-10-CM

## 2015-06-07 DIAGNOSIS — F411 Generalized anxiety disorder: Secondary | ICD-10-CM

## 2015-06-07 MED ORDER — SERTRALINE HCL 25 MG PO TABS: 25.0000 mg | ORAL_TABLET | Freq: Every day | ORAL | Status: DC

## 2015-06-07 NOTE — Telephone Encounter (Signed)
Future appt scheduled.  

## 2015-06-07 NOTE — Progress Notes (Signed)
Pre visit review using our clinic review tool, if applicable. No additional management support is needed unless otherwise documented below in the visit note. 

## 2015-06-07 NOTE — Patient Instructions (Addendum)
Please get the blood pressure cuff so we know what your daily bp readings area. Your are still on doxasin by a former cardiologist. You were told this could help Raynauds. I think amlodipine is a better option. But want you to give me update on your bp readings first over next week before we make a decision on stopping doxasin and starting amlodipine. Also for any bp med changes in future we want to know your bp level response to these changes.  Will refer you to PT for your rt arm weakness.  Please call Dr. Pearlean Brownie office and let me know if speech therapy appointment set up. Also update on if doing restore trial.   For your anxiety. Will add sertraline.  Follow up in 2 wks or as needed.  Lipid panel will be done in 3 months.

## 2015-06-07 NOTE — Progress Notes (Signed)
Subjective:    Patient ID: Dawn Escobar, female    DOB: 1961/09/17, 54 y.o.   MRN: [id number]  HPI I had a long d/w patient and husband about his recent stroke, risk for recurrent stroke/TIAs, personally independently reviewed imaging studies and stroke evaluation results and answered questions.Continue Eliquis for secondary stroke prevention and maintain strict control of hypertension with blood pressure goal below 130/90, diabetes with hemoglobin A1c goal below 6.5% and lipids with LDL cholesterol goal below 100 mg/dL. we talked about the lack of data about comparison of different new anti-coagulation medications to each other and superiority over each other. I also advised the patient to eat a healthy diet with plenty of whole grains, cereals, fruits and vegetables, exercise regularly and maintain ideal body weight .greater than 50% of time during this 25 minute visit were spent on counseling and coordination of care. Consider possible participation in the RESTORE rehabilitation trial if interested Followup in the future with me in 6 months or call earlier if necessary.  Above is summary of Dr. Pearlean Brownie appointment.  Pt a1-c less than 6.5.  Pt lipids are high. Pt neurologist did rx Crestor. Prior statins did not conrol her cholesterol as well as Crestor did in the past.  Pt is taking the Eliquis. Pt was having some compliance issues before. She was counseled to be compliant.   Pt has some intermittent slurred speech since last event for which she was hospitalized. Rt upper ext weakness persists but same as last physical exam.  She has chronic anxiety history as well. She request add on other medication in addition to what she was formerly on.  Pt still has not gotten blood pressure cuff filled. I advised pt to get even if not covered by insurance. I gave her script on last visit.    Review of Systems  Constitutional: Negative for fever, chills and fatigue.  HENT: Negative for  congestion, drooling, ear pain, hearing loss, postnasal drip and rhinorrhea.   Respiratory: Negative for cough, chest tightness, shortness of breath and wheezing.   Cardiovascular: Negative for chest pain and palpitations.  Gastrointestinal: Negative for abdominal pain.  Musculoskeletal: Negative for back pain.  Skin: Negative for rash.  Neurological: Positive for weakness. Negative for dizziness, speech difficulty, numbness and headaches.       Rt side ext. Since one week before last visit.  Some intermittent slurred speech as well but not new. Present one week before last visit.  Hematological: Negative for adenopathy. Does not bruise/bleed easily.  Psychiatric/Behavioral: Negative for suicidal ideas, behavioral problems, decreased concentration and agitation. The patient is nervous/anxious.     Past Medical History  Diagnosis Date  . PTSD (post-traumatic stress disorder)   . Raynaud's disease   . Anxiety   . Hypercholesteremia   . Stroke   . GERD (gastroesophageal reflux disease) 04/25/2015  . Hereditary and idiopathic peripheral neuropathy 04/25/2015  . Hypertension   . Atrial fibrillation   . Anxiety   . Migraine     Social History   Social History  . Marital Status: Single    Spouse Name: N/A  . Number of Children: 0  . Years of Education: 12th   Occupational History  . n/a    Social History Main Topics  . Smoking status: Current Every Day Smoker -- 0.20 packs/day    Types: Cigarettes  . Smokeless tobacco: Never Used  . Alcohol Use: 0.0 oz/week    0 Standard drinks or equivalent per week  . Drug Use:  No  . Sexual Activity: Not on file   Other Topics Concern  . Not on file   Social History Narrative   Patient lives at home alone.   Caffeine use: 1 cup of coffee and 20 oz of soda    Past Surgical History  Procedure Laterality Date  . Wrist surgery    . Abdominal hysterectomy    . Knee surgery    . Hip surgery  Right  . Tee without cardioversion N/A  09/05/2014    Procedure: TRANSESOPHAGEAL ECHOCARDIOGRAM (TEE);  Surgeon: Wendall Stade, MD;  Location: [facility] ENDOSCOPY;  Service: Cardiovascular;  Laterality: N/A;  loop after  . 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 Colon cancer Brother   . Heart disease Mother   . Heart attack Father     Allergies  Allergen Reactions  . Morphine And Related Hives  . Tape Rash    No tape at all    Current Outpatient Prescriptions on File Prior to Visit  Medication Sig Dispense Refill  . acetaminophen (TYLENOL) 500 MG tablet Take 500 mg by mouth every 6 (six) hours as needed for mild pain, moderate pain or headache.    Marland Kitchen amitriptyline (ELAVIL) 25 MG tablet Take 1 tablet (25 mg total) by mouth daily. (Patient taking differently: Take 25 mg by mouth at bedtime. ) 90 tablet 3  . apixaban (ELIQUIS) 5 MG TABS tablet Take 1 tablet (5 mg total) by mouth 2 (two) times daily. 60 tablet 11  . clonazePAM (KLONOPIN) 0.5 MG tablet Take 1 tablet (0.5 mg total) by mouth 2 (two) times daily as needed (anxiety). 14 tablet 0  . esomeprazole (NEXIUM) 40 MG capsule Take 1 capsule (40 mg total) by mouth daily. 30 capsule 3  . gabapentin (NEURONTIN) 100 MG capsule TAKE 1 CAPSULE (100 MG TOTAL) BY MOUTH 3 (THREE) TIMES DAILY. 90 capsule 1  . methocarbamol (ROBAXIN) 500 MG tablet 1 TABLET AT BEDTIME AS NEEDED FOR MUSCLE SPASMS (Patient taking differently: TAKE 500 MG BY MOUTH DAILY AT BEDTIME) 15 tablet 1  . traZODone (DESYREL) 100 MG tablet TAKE 1 TABLET (100 MG TOTAL) BY MOUTH AT BEDTIME. 30 tablet 1  . rosuvastatin (CRESTOR) 40 MG tablet Take 1 tablet (40 mg total) by mouth daily. (Patient not taking: Reported on 06/07/2015) 30 tablet 5   No current facility-administered medications on file prior to visit.    BP 128/59 mmHg  Pulse 90  Temp(Src) 99.1 F (37.3 C) (Oral)  Ht 5\' 5"  (1.651 m)  Wt 182 lb 6.4 oz (82.736  kg)  BMI 30.35 kg/m2  SpO2 97%       Objective:   Physical Exam General Mental Status- Alert. General Appearance- Not in acute distress.   Skin General: Color- Normal Color. Moisture- Normal Moisture.  Neck Carotid Arteries- Normal color. Moisture- Normal Moisture. No carotid bruits. No JVD.  Chest and Lung Exam Auscultation: Breath Sounds:-Normal.  Cardiovascular Auscultation:Rythm- Regular. Murmurs & Other Heart Sounds:Auscultation of the heart reveals- No Murmurs.  Abdomen Inspection:-Inspeection Normal. Palpation/Percussion:Note:No mass. Palpation and Percussion of the abdomen reveal- Non Tender, Non Distended + BS, no rebound or guarding.    Neurologic Cranial Nerve exam:- CN III-XII intact(No nystagmus), symmetric smile. Drift Test:- No drift. Romberg Exam:- Negative.  Heal to Toe Gait exam:-Normal. Finger to Nose:- Normal/Intact Strength:- Rt upper ext 3-4/5 equal and symmetric strength both  upper and lower extremities. Lt side 5/5.  Lower ext- 5/5 lt side strength. Rt side 4-5/5 strength.       Assessment & Plan:

## 2015-06-13 ENCOUNTER — Encounter: Payer: Self-pay | Admitting: Internal Medicine

## 2015-06-15 ENCOUNTER — Ambulatory Visit: Payer: Medicare Other | Admitting: Medical

## 2015-06-18 ENCOUNTER — Ambulatory Visit (INDEPENDENT_AMBULATORY_CARE_PROVIDER_SITE_OTHER): Payer: Medicare Other | Admitting: *Deleted

## 2015-06-18 ENCOUNTER — Ambulatory Visit: Payer: Medicare Other | Attending: Medical | Admitting: Physical Therapy

## 2015-06-18 DIAGNOSIS — I6340 Cerebral infarction due to embolism of unspecified cerebral artery: Secondary | ICD-10-CM

## 2015-06-18 DIAGNOSIS — R4701 Aphasia: Secondary | ICD-10-CM | POA: Insufficient documentation

## 2015-06-18 DIAGNOSIS — I69922 Dysarthria following unspecified cerebrovascular disease: Secondary | ICD-10-CM | POA: Insufficient documentation

## 2015-06-18 DIAGNOSIS — R29898 Other symptoms and signs involving the musculoskeletal system: Secondary | ICD-10-CM | POA: Diagnosis not present

## 2015-06-18 NOTE — Therapy (Signed)
[facility] [address]  [address] [place], [place], [address] Phone: [phone]   Fax:  [phone]  Physical Therapy Evaluation  Patient Details  Name: Dawn Escobar MRN: [id number] Date of Birth: [birth year] Referring Provider:  Esperanza Richters, PA-C  Encounter Date: 06/18/2015      PT End of Session - 06/18/15 1720    Visit Number 1   Number of Visits 1   Date for PT Re-Evaluation --  Eval only   PT Start Time 1418  Patient slow to complete admission paperwork, then needed to use bathroom before initiating eval   PT Stop Time 1453   PT Time Calculation (min) 35 min   Activity Tolerance Patient tolerated treatment well   Behavior During Therapy [facility] for tasks assessed/performed      Past Medical History  Diagnosis Date  . PTSD (post-traumatic stress disorder)   . Raynaud's disease   . Anxiety   . Hypercholesteremia   . Stroke   . GERD (gastroesophageal reflux disease) 04/25/2015  . Hereditary and idiopathic peripheral neuropathy 04/25/2015  . Hypertension   . Atrial fibrillation   . Anxiety   . Migraine     Past Surgical History  Procedure Laterality Date  . Wrist surgery    . Abdominal hysterectomy    . Knee surgery    . Hip surgery  Right  . Tee without cardioversion N/A 09/05/2014    Procedure: TRANSESOPHAGEAL ECHOCARDIOGRAM (TEE);  Surgeon: Wendall Stade, MD;  Location: [facility] ENDOSCOPY;  Service: Cardiovascular;  Laterality: N/A;  loop after  . 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  Visit Diagnosis:  Right arm weakness      Subjective Assessment - 06/18/15 1419    Subjective Patient reports onset of right arm weakness and speech deficits following a stroke ~15-20 days ago. Patient reports this is her second stroke, the first being on 08/30/14, after which the symptoms completely resolved with  therapy. Patient states right arm weakness limits ability to pick up objects, open jars, etc. States it take sher longer to complete ADL's due to difficulty using right hand.   Pertinent History CVA with right hemiparesis 08/30/14, and again on 06/01/15   Diagnostic tests MRI 06/01/15: Early subacute acute left MCA territory cortical infarct    Currently in Pain? Yes   Pain Score 7    Pain Location Back   Pain Orientation Lower;Mid   Pain Descriptors / Indicators Aching   Pain Type Chronic pain   Pain Onset More than a month ago   Pain Frequency Constant   Multiple Pain Sites Yes   Pain Score 7   Pain Location Neck  + headache   Pain Orientation Right   Pain Descriptors / Indicators Aching   Pain Type Acute pain   Pain Onset 1 to 4 weeks ago   Pain Frequency Several days a week            [facility] PT Assessment - 06/18/15 1418    Assessment   Medical Diagnosis R UE weakness s/p CVA   Onset Date/Surgical Date 06/01/15   Hand Dominance Left   Balance Screen   Has the patient fallen in the past 6 months No   Has the patient had a decrease in activity level because of a fear of falling?  No   Is the patient  reluctant to leave their home because of a fear of falling?  No   Home Environment   Living Environment Other (Comment)   Additional Comments Motel   Prior Function   Level of Independence Independent   Vocation On disability   Leisure Try to get out at least 1x/day   Observation/Other Assessments   Focus on Therapeutic Outcomes (FOTO)  58% (42% limitation)   Sensation   Additional Comments reports occasional numbess and tingling down right arm in thumb, 2nd and 3rd digits   Coordination   Gross Motor Movements are Fluid and Coordinated No   Fine Motor Movements are Fluid and Coordinated No   Coordination and Movement Description Ataxic   Finger Nose Finger Test Ataxic with target approximation   Tone   Assessment Location Right Upper Extremity   ROM / Strength   AROM  / PROM / Strength AROM;Strength   AROM   AROM Assessment Site Shoulder   Right/Left Shoulder Right;Left   Right Shoulder Flexion 134 Degrees   Right Shoulder ABduction 123 Degrees   Right Shoulder Internal Rotation --  WFL behind back   Right Shoulder External Rotation --  WFL behind head   Left Shoulder Flexion 166 Degrees   Left Shoulder ABduction 160 Degrees   Left Shoulder Internal Rotation --  WFL behind back   Left Shoulder External Rotation --  Limited reach behind head   Strength   Overall Strength Comments Left UE WNL   Strength Assessment Site Shoulder;Elbow;Forearm;Wrist;Hand   Right/Left Shoulder Right   Right Shoulder Flexion 3+/5   Right Shoulder ABduction 3+/5   Right Shoulder Internal Rotation 4-/5   Right Shoulder External Rotation 3+/5   Right/Left Elbow Right   Right Elbow Flexion 4-/5   Right Elbow Extension 3/5   Right/Left Forearm Right   Right Forearm Pronation 3+/5   Right Forearm Supination 3+/5   Right/Left Wrist Right   Right Wrist Flexion 3+/5   Right Wrist Extension 3+/5   Right Wrist Radial Deviation 3+/5   Right Wrist Ulnar Deviation 3+/5   Right/Left hand Right;Left   Right Hand Grip (lbs) 10   Right Hand Lateral Pinch 8 lbs   Left Hand Grip (lbs) 55   Left Hand Lateral Pinch 12 lbs   RUE Tone   RUE Tone Mild;Hypotonic                                       Plan - 06/18/15 1722    Clinical Impression Statement Patient referred for OP PT for R arm weakness following left MCA territory cerebral infarct on 06/01/15. Patient demonstrates decreased AROM of right UE relative to left stemming from right UE weakness and lack of coordination, with right UE strength grossly 3+/5. Right grip strength also significantly diminished relative to left (with left impaired from h/o mutiple surgeries and fused wrist). Patient demonstrating ataxic movement patterns with both gross and fine motor coordination testing in right UE.  Right UE sensation impaired with patient reporting intermittent numbness and tingling along medial nerve distribution, which may also be related to neck pain issues. Right UE weakness, ROM limitations and coordination deficits impact patient's ability to perform ADL's; resulting in increased time to complete tasks and difficulty performing any tasks requiring grip strength such as opening a jar. Upon completion of assessment, patient stating that she has also been referred for speech therapy at another location for  the speech and language deficits resulting from the CVA and inquired if it would be possible to receive all services at the same location. Patient informed that SLP services not available at this location, but PT and OT available at neuro location where she will be receiving SLP. Will plan to transition services to neuro location to simplify therapy regimen for patient, and will recommend OT in place of PT to address impact of right UE weakness on ADL performance.   PT Duration --  Eval only   Recommended Other Services OT for right UE weakness s/p CVA impacting ADL performance   Consulted and Agree with Plan of Care Patient          G-Codes - July 06, 2015 1744    Functional Assessment Tool Used FOTO = 58% (42% limitation)   Functional Limitation Carrying, moving and handling objects   Carrying, Moving and Handling Objects Current Status (J1914) At least 40 percent but less than 60 percent impaired, limited or restricted   Carrying, Moving and Handling Objects Goal Status (N8295) At least 40 percent but less than 60 percent impaired, limited or restricted   Carrying, Moving and Handling Objects Discharge Status [phone]) At least 40 percent but less than 60 percent impaired, limited or restricted       Problem List Patient Active Problem List   Diagnosis Date Noted  . Generalized anxiety disorder 04/25/2015  . History of depression 04/25/2015  . Raynaud's disease 04/25/2015  . GERD  (gastroesophageal reflux disease) 04/25/2015  . Neuropathy 04/25/2015  . Neck pain 04/25/2015  . Atrial fibrillation 10/14/2014  . Acute CVA (cerebrovascular accident)   . Rash and nonspecific skin eruption   . Cerebral embolism with cerebral infarction   . 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 Tobacco abuse   . TIA (transient ischemic attack) 08/30/2014  . Hypercholesterolemia 08/30/2014    Marry Guan, PT, MPT 07/06/15, 5:49 PM  [facility] [address]  [address] [place], [place], [address] Phone: [phone]   Fax:  [phone]

## 2015-06-19 NOTE — Progress Notes (Signed)
Loop recorder 

## 2015-06-21 ENCOUNTER — Ambulatory Visit (INDEPENDENT_AMBULATORY_CARE_PROVIDER_SITE_OTHER): Payer: Medicare Other | Admitting: Medical

## 2015-06-21 ENCOUNTER — Encounter: Payer: Self-pay | Admitting: Medical

## 2015-06-21 VITALS — BP 110/70 | HR 82 | Temp 98.1°F | Resp 16 | Ht 65.0 in | Wt 182.8 lb

## 2015-06-21 DIAGNOSIS — I6340 Cerebral infarction due to embolism of unspecified cerebral artery: Secondary | ICD-10-CM

## 2015-06-21 DIAGNOSIS — I10 Essential (primary) hypertension: Secondary | ICD-10-CM | POA: Diagnosis not present

## 2015-06-21 DIAGNOSIS — R29898 Other symptoms and signs involving the musculoskeletal system: Secondary | ICD-10-CM | POA: Diagnosis not present

## 2015-06-21 DIAGNOSIS — F411 Generalized anxiety disorder: Secondary | ICD-10-CM

## 2015-06-21 MED ORDER — CLONAZEPAM 0.5 MG PO TABS: 0.5000 mg | ORAL_TABLET | Freq: Two times a day (BID) | ORAL | Status: DC | PRN

## 2015-06-21 MED ORDER — METHOCARBAMOL 500 MG PO TABS: ORAL_TABLET | ORAL | Status: DC

## 2015-06-21 NOTE — Progress Notes (Signed)
Subjective:    Patient ID: Dawn Escobar, female    DOB: 10-Jul-1961, 54 y.o.   MRN: [id number]  HPI  Pt is in for follow up.  Pt states idea of taking sertraline making her nervous. She has various concerns about various things she read on package insert. Reviewed these with pt. And I counseled with her possible side effects  but unlikely. Benefit of med exceeds risk presently. She can try if has any stop med and notify us.  Pt just go her blood pressure machine today. Has not checked bp yet.  No recurrent neurologic signs or symptoms. No chest pain.  Pt has gone to PT. She also will be seeing speech therapy.  Overall she is feeling well.  Pt is in post stoke period has see both PT and has followed up with neurologist.   Review of Systems  Constitutional: Negative for fever, chills and fatigue.  HENT: Negative for congestion, drooling, ear pain, hearing loss, postnasal drip and rhinorrhea.   Respiratory: Negative for cough, chest tightness, shortness of breath and wheezing.   Cardiovascular: Negative for chest pain and palpitations.  Gastrointestinal: Negative for abdominal pain.  Musculoskeletal: Negative for back pain.  Skin: Negative for rash.  Neurological: Positive for weakness. Negative for dizziness, speech difficulty, numbness and headaches.       Rt side ext same  Some intermittent slurred speech as well but not new. Present one week before last visit.  Hematological: Negative for adenopathy. Does not bruise/bleed easily.  Psychiatric/Behavioral: Negative for suicidal ideas, behavioral problems, decreased concentration and agitation. The patient is nervous/anxious.     Past Medical History  Diagnosis Date  . PTSD (post-traumatic stress disorder)   . Raynaud's disease   . Anxiety   . Hypercholesteremia   . Stroke   . GERD (gastroesophageal reflux disease) 04/25/2015  . Hereditary and idiopathic peripheral neuropathy 04/25/2015  . Hypertension   . Atrial  fibrillation   . Anxiety   . Migraine     Social History   Social History  . Marital Status: Single    Spouse Name: N/A  . Number of Children: 0  . Years of Education: 12th   Occupational History  . n/a    Social History Main Topics  . Smoking status: Current Every Day Smoker -- 0.20 packs/day    Types: Cigarettes  . Smokeless tobacco: Never Used  . Alcohol Use: 0.0 oz/week    0 Standard drinks or equivalent per week  . Drug Use: No  . Sexual Activity: Not on file   Other Topics Concern  . Not on file   Social History Narrative   Patient lives at home alone.   Caffeine use: 1 cup of coffee and 20 oz of soda    Past Surgical History  Procedure Laterality Date  . Wrist surgery    . Abdominal hysterectomy    . Knee surgery    . Hip surgery  Right  . Tee without cardioversion N/A 09/05/2014    Procedure: TRANSESOPHAGEAL ECHOCARDIOGRAM (TEE);  Surgeon: Wendall Stade, MD;  Location: [facility] ENDOSCOPY;  Service: Cardiovascular;  Laterality: N/A;  loop after  . 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 Colon cancer Brother   . Heart disease Mother   . Heart attack Father     Allergies  Allergen Reactions  . Morphine And Related Hives  .  Tape Rash    No tape at all    Current Outpatient Prescriptions on File Prior to Visit  Medication Sig Dispense Refill  . acetaminophen (TYLENOL) 500 MG tablet Take 500 mg by mouth every 6 (six) hours as needed for mild pain, moderate pain or headache.    Marland Kitchen amitriptyline (ELAVIL) 25 MG tablet Take 1 tablet (25 mg total) by mouth daily. (Patient taking differently: Take 25 mg by mouth at bedtime. ) 90 tablet 3  . apixaban (ELIQUIS) 5 MG TABS tablet Take 1 tablet (5 mg total) by mouth 2 (two) times daily. 60 tablet 11  . atorvastatin (LIPITOR) 80 MG tablet Take 80 mg by mouth daily.    . clonazePAM (KLONOPIN) 0.5 MG tablet Take 1  tablet (0.5 mg total) by mouth 2 (two) times daily as needed (anxiety). 14 tablet 0  . esomeprazole (NEXIUM) 40 MG capsule Take 1 capsule (40 mg total) by mouth daily. 30 capsule 3  . gabapentin (NEURONTIN) 100 MG capsule TAKE 1 CAPSULE (100 MG TOTAL) BY MOUTH 3 (THREE) TIMES DAILY. 90 capsule 1  . methocarbamol (ROBAXIN) 500 MG tablet 1 TABLET AT BEDTIME AS NEEDED FOR MUSCLE SPASMS (Patient taking differently: TAKE 500 MG BY MOUTH DAILY AT BEDTIME) 15 tablet 1  . rosuvastatin (CRESTOR) 40 MG tablet Take 1 tablet (40 mg total) by mouth daily. 30 tablet 5  . sertraline (ZOLOFT) 25 MG tablet Take 1 tablet (25 mg total) by mouth daily. 30 tablet 0  . traZODone (DESYREL) 100 MG tablet TAKE 1 TABLET (100 MG TOTAL) BY MOUTH AT BEDTIME. 30 tablet 1   No current facility-administered medications on file prior to visit.    BP 110/70 mmHg  Pulse 82  Temp(Src) 98.1 F (36.7 C) (Oral)  Resp 16  Ht  (1.651 m)  Wt 182 lb 12.8 oz (82.918 kg)  BMI 30.42 kg/m2  SpO2 98%       Objective:   Physical Exam Physical Exam General Mental Status- Alert. General Appearance- Not in acute distress.   Skin General: Color- Normal Color. Moisture- Normal Moisture.  Neck Carotid Arteries- Normal color. Moisture- Normal Moisture. No carotid bruits. No JVD.  Chest and Lung Exam Auscultation: Breath Sounds:-Normal.  Cardiovascular Auscultation:Rythm- Regular. Murmurs & Other Heart Sounds:Auscultation of the heart reveals- No Murmurs.  Abdomen Inspection:-Inspeection Normal. Palpation/Percussion:Note:No mass. Palpation and Percussion of the abdomen reveal- Non Tender, Non Distended + BS, no rebound or guarding.    Neurologic Cranial Nerve exam:- CN III-XII intact(No nystagmus), symmetric smile. Drift Test:- No drift. Romberg Exam:- Negative.  Heal to Toe Gait exam:-Normal. Finger to Nose:- Normal/Intact Strength:- Rt upper ext 3-4/5 equal and symmetric strength both upper and lower  extremities. Lt side 5/5.  Lower ext- 5/5 lt side strength. Rt side 4-5/5 strength.       Assessment & Plan:  For your blood pressure. Can now stop doxasin and start amlodipine. You have blood pressure cuff now and we will be able to now effect of med change. Check bp twice daily or if you feel bad. Give [place] call in one week with bp readings. If bp readings over 140/90 give [place] update on readings quicker.(note with raynaud hx better for pt to use amlodipine)  For your anxiety, I do think sertraline is safe. Benefit exceeds risk. But if you experience side effects stop med and notify us.  Refilled Klonopin  Continue PT. And attend speech therapy.  If you get stoke symptoms/new symptom then ED evaluation.  Follow  up in 1 month or as needed

## 2015-06-21 NOTE — Patient Instructions (Addendum)
For your blood pressure. Can now stop doxasin and start amlodipine. You have blood pressure cuff now and we will be able to now effect of med change. Check bp twice daily or if you feel bad. Give [place] call in one week with bp readings. If bp readings over 140/90 give [place] update on readings quicker.  For your anxiety, I do think sertraline is safe. Benefit exceeds risk. But if you experience side effects stop med and notify us. Refilled klonopin  Continue PT. And attend speech therapy.  If you get stoke symptoms/new symptom then ED evaluation.  Follow up in 1 month or as needed

## 2015-06-21 NOTE — Progress Notes (Signed)
Pre visit review using our clinic review tool, if applicable. No additional management support is needed unless otherwise documented below in the visit note. 

## 2015-06-26 ENCOUNTER — Ambulatory Visit: Payer: Medicare Other

## 2015-06-27 LAB — CUP PACEART REMOTE DEVICE CHECK: Date Time Interrogation Session: 20160911181848

## 2015-06-27 NOTE — Progress Notes (Signed)
Carelink summary report received. Battery status OK. Normal device function. No new symptom episodes, tachy episodes, brady, or pause episodes. No new AF episodes. Monthly summary reports and ROV with AS on 10/17/15 at 1:00pm.

## 2015-06-28 ENCOUNTER — Other Ambulatory Visit: Payer: Self-pay | Admitting: Medical

## 2015-06-28 NOTE — Telephone Encounter (Signed)
Refilled her robaxin

## 2015-07-02 ENCOUNTER — Ambulatory Visit: Payer: Medicare Other | Admitting: Speech Pathology

## 2015-07-02 ENCOUNTER — Telehealth: Payer: Self-pay | Admitting: Neurology

## 2015-07-02 ENCOUNTER — Other Ambulatory Visit: Payer: Self-pay | Admitting: Medical

## 2015-07-02 DIAGNOSIS — I69922 Dysarthria following unspecified cerebrovascular disease: Secondary | ICD-10-CM | POA: Diagnosis not present

## 2015-07-02 DIAGNOSIS — R4701 Aphasia: Secondary | ICD-10-CM | POA: Diagnosis not present

## 2015-07-02 DIAGNOSIS — IMO0002 Reserved for concepts with insufficient information to code with codable children: Secondary | ICD-10-CM

## 2015-07-02 DIAGNOSIS — R29898 Other symptoms and signs involving the musculoskeletal system: Secondary | ICD-10-CM | POA: Diagnosis not present

## 2015-07-02 NOTE — Telephone Encounter (Signed)
The patient is doing [facility] and Dr. Pearlean Brownie also wants her to do PT. No one set her up for PT. So her primary care doctor set her up in [place] and did an evaluation there and she is wondering if she can do PT over at [facility] and she wants her PT evaluation that she had in [place] to be sent over there so she can have everything done in one place. She is wanting a referal to be sent over there for her PT at [facility]. The best number to contact is [phone]

## 2015-07-02 NOTE — Patient Instructions (Signed)
Homework packet provided 

## 2015-07-02 NOTE — Therapy (Signed)
[facility] [address] [place], [place], [address] Phone: [phone]   Fax:  [phone]  Speech Language Pathology Evaluation  Patient Details  Name: Dawn Escobar MRN: [id number] Date of Birth: Jul 03, 1961 Referring Provider:  Micki Riley, MD  Encounter Date: 07/02/2015      End of Session - 07/02/15 1414    Visit Number 1   Number of Visits 16   Date for SLP Re-Evaluation 08/27/15   SLP Start Time 1317   SLP Stop Time  1410   SLP Time Calculation (min) 53 min   Activity Tolerance Patient tolerated treatment well      Past Medical History  Diagnosis Date  . PTSD (post-traumatic stress disorder)   . Raynaud's disease   . Anxiety   . Hypercholesteremia   . Stroke   . GERD (gastroesophageal reflux disease) 04/25/2015  . Hereditary and idiopathic peripheral neuropathy 04/25/2015  . Hypertension   . Atrial fibrillation   . Anxiety   . Migraine     Past Surgical History  Procedure Laterality Date  . Wrist surgery    . Abdominal hysterectomy    . Knee surgery    . Hip surgery  Right  . Tee without cardioversion N/A 09/05/2014    Procedure: TRANSESOPHAGEAL ECHOCARDIOGRAM (TEE);  Surgeon: Wendall Stade, MD;  Location: [facility] ENDOSCOPY;  Service: Cardiovascular;  Laterality: N/A;  loop after  . 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  Visit Diagnosis: Receptive aphasia - Plan: SLP plan of care cert/re-cert  Dysarthria due to cerebrovascular accident - Plan: SLP plan of care cert/re-cert      Subjective Assessment - 07/02/15 1333    Subjective "I can't get words out and I have trouble understanding what I read and I have trouble spelling"   Currently in Pain? Yes   Pain Score 8    Pain Location Back   Pain Orientation Right;Lower   Pain Descriptors / Indicators Aching   Pain Descriptors /  Indicators Throbbing   Pain Type Chronic pain   Pain Onset More than a month ago   Pain Frequency Several days a week   Aggravating Factors  walking   Pain Relieving Factors nothing   Effect of Pain on Daily Activities can't exercise,             SLP Evaluation [facility] - 07/02/15 1333    SLP Visit Information   SLP Received On 07/02/15   Onset Date 06/01/15   Medical Diagnosis embolic CVA   General Information   Behavioral/Cognition Pt reports difficulty reading and writing   Mobility Status walks independently, no falls   Prior Functional Status   Cognitive/Linguistic Baseline Within functional limits   Type of Home Other(Comment)    Lives With Significant other   Vocation On disability   Cognition   Overall Cognitive Status Impaired/Different from baseline   Auditory Comprehension   Yes/No Questions Impaired   Complex Questions 50-74% accurate   Commands Impaired   Two Step Basic Commands 75-100% accurate   Multistep Basic Commands 25-49% accurate   Reading Comprehension   Reading Status Impaired   Word level 76-100% accurate   Sentence Level 51-75% accurate   Paragraph Level 26-50% accurate   Functional Environmental (signs, name badge) Impaired   Expression   Primary Mode of Expression Verbal   Verbal Expression  Overall Verbal Expression Impaired   Initiation No impairment   Automatic Speech Day of week   Level of Generative/Spontaneous Verbalization Conversation   Repetition Impaired   Level of Impairment Phrase level   Pragmatics No impairment   Written Expression   Dominant Hand Left   Written Expression Exceptions to [facility]   Overall Writen Expression slow, mildly labored - atypical, no spelling errors noted   Oral Motor/Sensory Function   Overall Oral Motor/Sensory Function Impaired  lower labial dyskinesia at rest    Labial ROM Within Functional Limits   Lingual ROM Within Functional Limits   Lingual Symmetry Within Functional Limits   Lingual  Strength Reduced Right   Motor Speech   Articulation Impaired   Level of Impairment Conversation   Intelligibility Intelligible   Phonation [facility]                         SLP Education - 07/02/15 1413    Education provided Yes   Education Details goals of therpy, compensations for aphasia   Person(s) Educated Patient   Methods Explanation;Demonstration   Comprehension Need further instruction          SLP Short Term Goals - 07/02/15 1429    SLP SHORT TERM GOAL #1   Title Pt will name 8 items for a simple category with occasional min A   Time 4   Period Weeks   Status New   SLP SHORT TERM GOAL #2   Title Pt will comprehend simple functional written material (menus, schedules, directions) with 75% accuracy and occasional minb A   Time 4   Period Weeks   Status New   SLP SHORT TERM GOAL #3   Title Pt will write simple sentences and functional information (forms, orders, lists) with 80% accuracy and occasional min A.    Time 4   Period Weeks   SLP SHORT TERM GOAL #4   Title Pt will utlize compensations for dysarthira during structured tasks with occasional min A   Baseline 4   Period Weeks   Status New          SLP Long Term Goals - 07/02/15 1433    SLP LONG TERM GOAL #1   Title Pt will participate in 8 minute simple conversation with compensations for dysarthria and aphasia with occasional min A   Time 8   Period Weeks   Status New   SLP LONG TERM GOAL #2   Title Pt will comprhend simple 3-5 sentence paragraph with 80% accuracy and rare min A   Time 8   Period Weeks   Status New          Plan - 07/02/15 1429    Clinical Impression Statement Dawn Escobar, a 54 y.o. female is s/p CVA 06/01/15. She had a prior CVA 08/2014. Dawn Escobar reports difficulty "getting out my words," and difficulty with written expression and reading comprehension. Simple conversation re: course of illness was relatively fluent and grammatic with slight slur. Upon formal  testing, automatic speech and repetition were halting, groping and slow, inconsistent with more fluent conversation. Simple naming of objects in the room was accurate with minimal amount of extended time. Pt named  6 items in one minute for a given category, with 10 in 1 minute being [facility]. Lingual weakness on the right noted, with extraneous low lip dyskinesia type movements at rest. Auditory comprehension intact to 2 step commands and mildly complex yes/no questions. Written expression  for biographical information accurate but slow, labored writting. Phrase to dictation without erors, but agian, slow. I recommend skilled ST to maximize verbal and written expression and reading comprehension for improved independence and QOL          G-Codes - 2015/07/10 1436    Functional Assessment Tool Used noms   Functional Limitations Spoken language expressive   Spoken Language Expression Current Status [id number]) At least 20 percent but less than 40 percent impaired, limited or restricted   Spoken Language Expression Goal Status (O9629) At least 1 percent but less than 20 percent impaired, limited or restricted      Problem List Patient Active Problem List   Diagnosis Date Noted  . Generalized anxiety disorder 04/25/2015  . History of depression 04/25/2015  . Raynaud's disease 04/25/2015  . GERD (gastroesophageal reflux disease) 04/25/2015  . Neuropathy 04/25/2015  . Neck pain 04/25/2015  . Atrial fibrillation 10/14/2014  . Acute CVA (cerebrovascular accident)   . Rash and nonspecific skin eruption   . Cerebral embolism with cerebral infarction   . 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 Tobacco abuse   . TIA (transient ischemic attack) 08/30/2014  . Hypercholesterolemia 08/30/2014    Lovvorn, Radene Journey MS, CCC-SLP 07/10/2015, 2:39 PM  [facility] [address] [place], [place], [address] Phone: [phone]    Fax:  [phone]

## 2015-07-03 ENCOUNTER — Other Ambulatory Visit: Payer: Self-pay | Admitting: Neurology

## 2015-07-03 DIAGNOSIS — I639 Cerebral infarction, unspecified: Secondary | ICD-10-CM

## 2015-07-03 NOTE — Telephone Encounter (Signed)
LFt vm for patient that order is for physical therapy for neuro rehab next [place].

## 2015-07-03 NOTE — Telephone Encounter (Signed)
Too soon for refill.

## 2015-07-03 NOTE — Telephone Encounter (Signed)
Yes. I put the order in

## 2015-07-12 ENCOUNTER — Encounter: Payer: Self-pay | Admitting: Internal Medicine

## 2015-07-17 ENCOUNTER — Telehealth: Payer: Self-pay | Admitting: *Deleted

## 2015-07-17 ENCOUNTER — Ambulatory Visit (INDEPENDENT_AMBULATORY_CARE_PROVIDER_SITE_OTHER): Payer: Medicare Other | Admitting: *Deleted

## 2015-07-17 DIAGNOSIS — I6319 Cerebral infarction due to embolism of other precerebral artery: Secondary | ICD-10-CM

## 2015-07-17 LAB — CUP PACEART REMOTE DEVICE CHECK: MDC IDC SESS DTM: 20161011173824

## 2015-07-17 NOTE — Telephone Encounter (Signed)
[facility] requesting call back.  Need to know if patient was symptomatic with tachy episode on LINQ transmission from 07/12/15.

## 2015-07-17 NOTE — Telephone Encounter (Signed)
Patient returned phone call.  She states that she occasionally experiences palpitations.  She states that she recently had another CVA and is "not recovering as quickly this time".  She states that she has been having memory and speech issues.  She states she is receiving therapy and states that her PCP started her on amlodipine and Zoloft recently.  Patient states she is taking all medications as prescribed, including Eliquis.  She is unable to recall if she had any symptoms related to the tachy episode on 07/12/15.  Encouraged patient to call with worsening symptoms, questions, or concerns; patient voices understanding of all instructions.

## 2015-07-18 ENCOUNTER — Ambulatory Visit (HOSPITAL_BASED_OUTPATIENT_CLINIC_OR_DEPARTMENT_OTHER)
Admission: RE | Admit: 2015-07-18 | Discharge: 2015-07-18 | Disposition: A | Discharge status: Home / Self Care | Payer: Medicare Other | Source: Ambulatory Visit | Attending: Medical | Admitting: Medical

## 2015-07-18 ENCOUNTER — Encounter: Payer: Self-pay | Admitting: Medical

## 2015-07-18 ENCOUNTER — Ambulatory Visit (INDEPENDENT_AMBULATORY_CARE_PROVIDER_SITE_OTHER): Payer: Medicare Other | Admitting: Medical

## 2015-07-18 VITALS — BP 130/80 | HR 71 | Temp 97.8°F | Ht 65.0 in | Wt 186.0 lb

## 2015-07-18 DIAGNOSIS — I6340 Cerebral infarction due to embolism of unspecified cerebral artery: Secondary | ICD-10-CM | POA: Diagnosis not present

## 2015-07-18 DIAGNOSIS — M549 Dorsalgia, unspecified: Secondary | ICD-10-CM | POA: Diagnosis present

## 2015-07-18 DIAGNOSIS — J208 Acute bronchitis due to other specified organisms: Secondary | ICD-10-CM | POA: Diagnosis not present

## 2015-07-18 DIAGNOSIS — R05 Cough: Secondary | ICD-10-CM

## 2015-07-18 DIAGNOSIS — R197 Diarrhea, unspecified: Secondary | ICD-10-CM | POA: Diagnosis not present

## 2015-07-18 DIAGNOSIS — R059 Cough, unspecified: Secondary | ICD-10-CM

## 2015-07-18 DIAGNOSIS — R6883 Chills (without fever): Secondary | ICD-10-CM | POA: Insufficient documentation

## 2015-07-18 DIAGNOSIS — J0100 Acute maxillary sinusitis, unspecified: Secondary | ICD-10-CM

## 2015-07-18 DIAGNOSIS — R5383 Other fatigue: Secondary | ICD-10-CM | POA: Insufficient documentation

## 2015-07-18 LAB — CBC WITH DIFFERENTIAL/PLATELET
BASOS ABS: 0.1 10*3/uL (ref 0.0–0.1)
Basophils Relative: 0.6 % (ref 0.0–3.0)
EOS PCT: 1.3 % (ref 0.0–5.0)
Eosinophils Absolute: 0.1 10*3/uL (ref 0.0–0.7)
HCT: 39.9 % (ref 36.0–46.0)
HEMOGLOBIN: 13.1 g/dL (ref 12.0–15.0)
Lymphocytes Relative: 16.6 % (ref 12.0–46.0)
Lymphs Abs: 1.9 10*3/uL (ref 0.7–4.0)
MCHC: 32.9 g/dL (ref 30.0–36.0)
MCV: 92.7 fl (ref 78.0–100.0)
MONO ABS: 0.6 10*3/uL (ref 0.1–1.0)
Monocytes Relative: 5.2 % (ref 3.0–12.0)
NEUTROS PCT: 76.3 % (ref 43.0–77.0)
Neutro Abs: 8.7 10*3/uL — ABNORMAL HIGH (ref 1.4–7.7)
Platelets: 185 10*3/uL (ref 150.0–400.0)
RBC: 4.3 Mil/uL (ref 3.87–5.11)
RDW: 13.5 % (ref 11.5–15.5)
WBC: 11.4 10*3/uL — AB (ref 4.0–10.5)

## 2015-07-18 MED ORDER — DOXYCYCLINE HYCLATE 100 MG PO TABS: 100.0000 mg | ORAL_TABLET | Freq: Two times a day (BID) | ORAL | Status: DC

## 2015-07-18 MED ORDER — CEFTRIAXONE SODIUM 1 G IJ SOLR
1.0000 g | Freq: Once | INTRAMUSCULAR | Status: AC
  Administered 2015-07-18: 1 g via INTRAMUSCULAR

## 2015-07-18 MED ORDER — BENZONATATE 100 MG PO CAPS: 100.0000 mg | ORAL_CAPSULE | Freq: Three times a day (TID) | ORAL | Status: DC | PRN

## 2015-07-18 MED ORDER — FLUTICASONE PROPIONATE 50 MCG/ACT NA SUSP: 2.0000 | Freq: Every day | NASAL | Status: DC

## 2015-07-18 NOTE — Progress Notes (Signed)
Pre visit review using our clinic review tool, if applicable. No additional management support is needed unless otherwise documented below in the visit note. 

## 2015-07-18 NOTE — Patient Instructions (Addendum)
You appear to have sinus infection. Possibly started with allergic rhinitis type symptoms.  Bronchitis as well. With recent fever, chills, sweats, fatigue and left lower rib pain on deep breathing, I do want to get cxr to evaluate for pneumonia.  Rx flonase for nasal congestion. We gave rocephin 1 gram im in office today.  Rx doxycycline antibioitc.  Benzonatate for cough  Please get cbc and cxr today.  Follow up in 7 days or as needed

## 2015-07-18 NOTE — Progress Notes (Signed)
Subjective:    Patient ID: Dawn Escobar, female    DOB: Mar 10, 1961, 54 y.o.   MRN: [id number]  HPI  Pt in states she feels bad. She states started with sneezing, then nasal congestion, and now sinus pressure. Also some chest congestion. She feels fatigue. Pt feels fever, chills and sweats. When she takes a deep breath her left side posterior back region hurts.  Pt states one time in past had pneumonia.   Pt mentioned that cardiologist has contacted about loop recording. She had transient increase hr. Pt does not remember feeling any symptoms. Pt states cardiologist is supposed to call her back after nurse relays message to cardiologist.    Review of Systems  Constitutional: Positive for fever, chills and fatigue.  HENT: Positive for congestion, postnasal drip, sinus pressure and sneezing.   Respiratory: Positive for cough. Negative for chest tightness, shortness of breath and wheezing.   Cardiovascular: Negative for chest pain and palpitations.  Musculoskeletal: Negative for back pain.  Skin: Negative for pallor and rash.  Neurological: Negative for dizziness, seizures, weakness and light-headedness.  Hematological: Negative for adenopathy. Does not bruise/bleed easily.   Past Medical History  Diagnosis Date  . PTSD (post-traumatic stress disorder)   . Raynaud's disease   . Anxiety   . Hypercholesteremia   . Stroke (HCC)   . GERD (gastroesophageal reflux disease) 04/25/2015  . Hereditary and idiopathic peripheral neuropathy 04/25/2015  . Hypertension   . Atrial fibrillation (HCC)   . Anxiety   . Migraine     Social History   Social History  . Marital Status: Single    Spouse Name: N/A  . Number of Children: 0  . Years of Education: 12th   Occupational History  . n/a    Social History Main Topics  . Smoking status: Current Every Day Smoker -- 0.20 packs/day    Types: Cigarettes  . Smokeless tobacco: Never Used  . Alcohol Use: 0.0 oz/week    0 Standard drinks  or equivalent per week  . Drug Use: No  . Sexual Activity: Not on file   Other Topics Concern  . Not on file   Social History Narrative   Patient lives at home alone.   Caffeine use: 1 cup of coffee and 20 oz of soda    Past Surgical History  Procedure Laterality Date  . Wrist surgery    . Abdominal hysterectomy    . Knee surgery    . Hip surgery  Right  . Tee without cardioversion N/A 09/05/2014    Procedure: TRANSESOPHAGEAL ECHOCARDIOGRAM (TEE);  Surgeon: Wendall Stade, MD;  Location: [facility] ENDOSCOPY;  Service: Cardiovascular;  Laterality: N/A;  loop after  . 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 Colon cancer Brother   . Heart disease Mother   . Heart attack Father     Allergies  Allergen Reactions  . Morphine And Related Hives  . Tape Rash    No tape at all    Current Outpatient Prescriptions on File Prior to Visit  Medication Sig Dispense Refill  . acetaminophen (TYLENOL) 500 MG tablet Take 500 mg by mouth every 6 (six) hours as needed for mild pain, moderate pain or headache.    Marland Kitchen amitriptyline (ELAVIL) 25 MG tablet Take 1 tablet (25 mg total) by mouth daily. (Patient taking differently: Take 25 mg by mouth at  bedtime. ) 90 tablet 3  . AMLODIPINE & DIET MANAGE PROD PO Take by mouth.    Marland Kitchen. apixaban (ELIQUIS) 5 MG TABS tablet Take 1 tablet (5 mg total) by mouth 2 (two) times daily. 60 tablet 11  . clonazePAM (KLONOPIN) 0.5 MG tablet Take 1 tablet (0.5 mg total) by mouth 2 (two) times daily as needed (anxiety). 60 tablet 0  . esomeprazole (NEXIUM) 40 MG capsule Take 1 capsule (40 mg total) by mouth daily. 30 capsule 3  . gabapentin (NEURONTIN) 100 MG capsule TAKE 1 CAPSULE (100 MG TOTAL) BY MOUTH 3 (THREE) TIMES DAILY. 90 capsule 1  . methocarbamol (ROBAXIN) 500 MG tablet 1 TABLET AT BEDTIME AS NEEDED FOR MUSCLE SPASMS 15 tablet 1  . methocarbamol (ROBAXIN) 500 MG  tablet 1 TABLET AT BEDTIME AS NEEDED FOR MUSCLE SPASMS 15 tablet 0  . rosuvastatin (CRESTOR) 40 MG tablet Take 1 tablet (40 mg total) by mouth daily. 30 tablet 5  . sertraline (ZOLOFT) 25 MG tablet Take 1 tablet (25 mg total) by mouth daily. 30 tablet 0  . traZODone (DESYREL) 100 MG tablet TAKE 1 TABLET (100 MG TOTAL) BY MOUTH AT BEDTIME. 30 tablet 1   No current facility-administered medications on file prior to visit.    BP 130/80 mmHg  Pulse 71  Temp(Src) 97.8 F (36.6 C) (Oral)  Ht 5\' 5"  (1.651 m)  Wt 186 lb (84.369 kg)  BMI 30.95 kg/m2  SpO2 97%       Objective:   Physical Exam  General  Mental Status - Alert. General Appearance - Well groomed. Not in acute distress.  Skin Rashes- No Rashes.  HEENT Head- Normal. Ear Auditory Canal - Left- Normal. Right - Normal.Tympanic Membrane- Left- Normal. Right- Normal. Eye Sclera/Conjunctiva- Left- Normal. Right- Normal. Nose & Sinuses Nasal Mucosa- Left-  Boggy and Congested. Right-  Boggy and  Congested.Bilateral maxillary sinus pressure and frontal sinus pressure. Mouth & Throat Lips: Upper Lip- Normal: no dryness, cracking, pallor, cyanosis, or vesicular eruption. Lower Lip-Normal: no dryness, cracking, pallor, cyanosis or vesicular eruption. Buccal Mucosa- Bilateral- No Aphthous ulcers. Oropharynx- No Discharge or Erythema. Tonsils: Characteristics- Bilateral- No Erythema or Congestion. Size/Enlargement- Bilateral- No enlargement. Discharge- bilateral-None.  Neck Neck- Supple. No Masses.   Chest and Lung Exam Auscultation: Breath Sounds:-even unlabored. Only faint rough breath sound lelt lower lobe.  Cardiovascular Auscultation:Rythm- Regular, rate and rhythm. Murmurs & Other Heart Sounds:Ausculatation of the heart reveal- No Murmurs.  Lymphatic Head & Neck General Head & Neck Lymphatics: Bilateral: Description- No Localized lymphadenopathy.       Assessment & Plan:  You appear to have sinus infection.  Possibly started with allergic rhinitis type symptoms.  Rx flonase for nasal congestion. We gave rocephin 1 gram im in office today.  Rx doxycycline antibioitc.  Benzonatate for cough  Please get cbc anc cxr today.  Follow up in 7 days or as needed

## 2015-07-18 NOTE — Progress Notes (Signed)
Loop recorder 

## 2015-07-20 ENCOUNTER — Other Ambulatory Visit: Payer: Self-pay | Admitting: Medical

## 2015-07-20 NOTE — Telephone Encounter (Signed)
Pt. Notified.

## 2015-07-20 NOTE — Telephone Encounter (Signed)
Notify pt refilled her sertraline and muscle relaxant.

## 2015-07-23 ENCOUNTER — Ambulatory Visit (INDEPENDENT_AMBULATORY_CARE_PROVIDER_SITE_OTHER): Payer: Medicare Other | Admitting: Medical

## 2015-07-23 ENCOUNTER — Encounter: Payer: Self-pay | Admitting: Medical

## 2015-07-23 VITALS — BP 110/68 | HR 77 | Temp 98.2°F | Resp 16 | Ht 65.0 in | Wt 185.8 lb

## 2015-07-23 DIAGNOSIS — R1012 Left upper quadrant pain: Secondary | ICD-10-CM | POA: Diagnosis not present

## 2015-07-23 DIAGNOSIS — K219 Gastro-esophageal reflux disease without esophagitis: Secondary | ICD-10-CM

## 2015-07-23 DIAGNOSIS — J0100 Acute maxillary sinusitis, unspecified: Secondary | ICD-10-CM | POA: Diagnosis not present

## 2015-07-23 DIAGNOSIS — I6340 Cerebral infarction due to embolism of unspecified cerebral artery: Secondary | ICD-10-CM

## 2015-07-23 DIAGNOSIS — J208 Acute bronchitis due to other specified organisms: Secondary | ICD-10-CM

## 2015-07-23 DIAGNOSIS — R0789 Other chest pain: Secondary | ICD-10-CM

## 2015-07-23 LAB — CBC WITH DIFFERENTIAL/PLATELET
BASOS ABS: 0.1 10*3/uL (ref 0.0–0.1)
Basophils Relative: 1 % (ref 0–1)
Eosinophils Absolute: 0.2 10*3/uL (ref 0.0–0.7)
Eosinophils Relative: 3 % (ref 0–5)
HEMATOCRIT: 39.7 % (ref 36.0–46.0)
HEMOGLOBIN: 13.8 g/dL (ref 12.0–15.0)
LYMPHS PCT: 30 % (ref 12–46)
Lymphs Abs: 2.2 10*3/uL (ref 0.7–4.0)
MCH: 31.2 pg (ref 26.0–34.0)
MCHC: 34.8 g/dL (ref 30.0–36.0)
MCV: 89.8 fL (ref 78.0–100.0)
MPV: 8.5 fL — AB (ref 8.6–12.4)
Monocytes Absolute: 0.4 10*3/uL (ref 0.1–1.0)
Monocytes Relative: 6 % (ref 3–12)
NEUTROS ABS: 4.3 10*3/uL (ref 1.7–7.7)
Neutrophils Relative %: 60 % (ref 43–77)
Platelets: 185 10*3/uL (ref 150–400)
RBC: 4.42 MIL/uL (ref 3.87–5.11)
RDW: 14.1 % (ref 11.5–15.5)
WBC: 7.2 10*3/uL (ref 4.0–10.5)

## 2015-07-23 LAB — COMPREHENSIVE METABOLIC PANEL
ALBUMIN: 4.3 g/dL (ref 3.6–5.1)
ALK PHOS: 69 U/L (ref 33–130)
ALT: 11 U/L (ref 6–29)
AST: 19 U/L (ref 10–35)
BILIRUBIN TOTAL: 0.5 mg/dL (ref 0.2–1.2)
BUN: 9 mg/dL (ref 7–25)
CALCIUM: 9.2 mg/dL (ref 8.6–10.4)
CO2: 27 mmol/L (ref 20–31)
CREATININE: 0.87 mg/dL (ref 0.50–1.05)
Chloride: 106 mmol/L (ref 98–110)
GLUCOSE: 101 mg/dL — AB (ref 65–99)
Potassium: 4.5 mmol/L (ref 3.5–5.3)
Sodium: 141 mmol/L (ref 135–146)
Total Protein: 7.1 g/dL (ref 6.1–8.1)

## 2015-07-23 LAB — AMYLASE: Amylase: 22 U/L (ref 0–105)

## 2015-07-23 LAB — TROPONIN I

## 2015-07-23 LAB — LIPASE: Lipase: 8 U/L (ref 7–60)

## 2015-07-23 MED ORDER — GI COCKTAIL ~~LOC~~
30.0000 mL | Freq: Once | ORAL | Status: AC
  Administered 2015-07-23: 30 mL via ORAL

## 2015-07-23 MED ORDER — OMEPRAZOLE 20 MG PO CPDR: 20.0000 mg | DELAYED_RELEASE_CAPSULE | Freq: Every day | ORAL | Status: DC

## 2015-07-23 NOTE — Addendum Note (Signed)
Addended by: Harley AltoPRICE, Rashmi Tallent M on: 07/23/2015 02:58 PM   Modules accepted: Orders

## 2015-07-23 NOTE — Progress Notes (Signed)
Pre visit review using our clinic review tool, if applicable. No additional management support is needed unless otherwise documented below in the visit note. 

## 2015-07-23 NOTE — Progress Notes (Signed)
Subjective:    Patient ID: Dawn Escobar, female    DOB: 1961-09-29, 54 y.o.   MRN: [id number]  HPI   Pt in states she feeling worse than before. Below is from last visit  (Pt in states she feels bad. She states started with sneezing, then nasal congestion, and now sinus pressure. Also some chest congestion. She feels fatigue. Pt feels fever, chills and sweats. When she takes a deep breath her left side posterior back region hurts.  Pt states one time in past had pneumonia.   Pt mentioned that cardiologist has contacted about loop recording. She had transient increase hr. Pt does not remember feeling any symptoms. Pt states cardiologist is supposed to call her back after nurse relays message to cardiologist.)  Above area in () is from last visit. Sneezing still. Congestion about the same. Sinus pressure slight improved. Chest congestion about the same. Pt states burning sensation to throat and burning sensation when she swollows.. Pt had esophagus study ordered in computer. Pt states this was never done. She states GI office did evaluate after a stroke.  Pt states some diffuse pain across up chest for 4-5 days. No reported diaphoresis, no jaw pain, or sob. Then states her fiancee states whe wheezes some. Pt does smoke.  Pt last labs showed slight wbc increase 5 days ago. Chest xray did not show any abnormality.  Pt last treated with rocephin 1 gram im, doxcycline.and benzonatate. Pt feels like burning in throat, esophagus and chest got worse with doxycycline.     Review of Systems  Constitutional: Positive for fever and chills. Negative for diaphoresis and fatigue.  HENT: Positive for congestion, sinus pressure and sneezing. Negative for postnasal drip.   Respiratory: Negative for cough, chest tightness, shortness of breath and wheezing.   Cardiovascular: Negative for chest pain and palpitations.  Gastrointestinal: Positive for abdominal pain.       Burning to throat. Some report  of left side abdominal pain.   I don't recall her mentioning any abdomen pain on last visit.  Musculoskeletal: Negative for back pain.  Skin: Negative for pallor and rash.  Neurological: Negative for dizziness, seizures, weakness and light-headedness.  Hematological: Negative for adenopathy. Does not bruise/bleed easily.  Psychiatric/Behavioral: Negative for behavioral problems and confusion.     Past Medical History  Diagnosis Date  . PTSD (post-traumatic stress disorder)   . Raynaud's disease   . Anxiety   . Hypercholesteremia   . Stroke (HCC)   . GERD (gastroesophageal reflux disease) 04/25/2015  . Hereditary and idiopathic peripheral neuropathy 04/25/2015  . Hypertension   . Atrial fibrillation (HCC)   . Anxiety   . Migraine     Social History   Social History  . Marital Status: Single    Spouse Name: N/A  . Number of Children: 0  . Years of Education: 12th   Occupational History  . n/a    Social History Main Topics  . Smoking status: Current Every Day Smoker -- 0.20 packs/day    Types: Cigarettes  . Smokeless tobacco: Never Used  . Alcohol Use: 0.0 oz/week    0 Standard drinks or equivalent per week  . Drug Use: No  . Sexual Activity: Not on file   Other Topics Concern  . Not on file   Social History Narrative   Patient lives at home alone.   Caffeine use: 1 cup of coffee and 20 oz of soda    Past Surgical History  Procedure Laterality Date  .  Wrist surgery    . Abdominal hysterectomy    . Knee surgery    . Hip surgery  Right  . Tee without cardioversion N/A 09/05/2014    Procedure: TRANSESOPHAGEAL ECHOCARDIOGRAM (TEE);  Surgeon: Wendall Stade, MD;  Location: [name] ENDOSCOPY;  Service: Cardiovascular;  Laterality: N/A;  loop after  . 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 Colon cancer Brother   . Heart disease Mother   . Heart attack Father       Allergies  Allergen Reactions  . Morphine And Related Hives  . Tape Rash    No tape at all    Current Outpatient Prescriptions on File Prior to Visit  Medication Sig Dispense Refill  . acetaminophen (TYLENOL) 500 MG tablet Take 500 mg by mouth every 6 (six) hours as needed for mild pain, moderate pain or headache.    Marland Kitchen amitriptyline (ELAVIL) 25 MG tablet Take 1 tablet (25 mg total) by mouth daily. (Patient taking differently: Take 25 mg by mouth at bedtime. ) 90 tablet 3  . AMLODIPINE & DIET MANAGE PROD PO Take by mouth.    Marland Kitchen apixaban (ELIQUIS) 5 MG TABS tablet Take 1 tablet (5 mg total) by mouth 2 (two) times daily. 60 tablet 11  . benzonatate (TESSALON) 100 MG capsule Take 1 capsule (100 mg total) by mouth 3 (three) times daily as needed. 30 capsule 0  . clonazePAM (KLONOPIN) 0.5 MG tablet Take 1 tablet (0.5 mg total) by mouth 2 (two) times daily as needed (anxiety). 60 tablet 0  . doxycycline (VIBRA-TABS) 100 MG tablet Take 1 tablet (100 mg total) by mouth 2 (two) times daily. 20 tablet 0  . esomeprazole (NEXIUM) 40 MG capsule Take 1 capsule (40 mg total) by mouth daily. 30 capsule 3  . fluticasone (FLONASE) 50 MCG/ACT nasal spray Place 2 sprays into both nostrils daily. 16 g 1  . gabapentin (NEURONTIN) 100 MG capsule TAKE 1 CAPSULE (100 MG TOTAL) BY MOUTH 3 (THREE) TIMES DAILY. 90 capsule 1  . methocarbamol (ROBAXIN) 500 MG tablet 1 TABLET AT BEDTIME AS NEEDED FOR MUSCLE SPASMS 15 tablet 1  . rosuvastatin (CRESTOR) 40 MG tablet Take 1 tablet (40 mg total) by mouth daily. 30 tablet 5  . sertraline (ZOLOFT) 25 MG tablet TAKE 1 TABLET (25 MG TOTAL) BY MOUTH DAILY. 30 tablet 2  . traZODone (DESYREL) 100 MG tablet TAKE 1 TABLET (100 MG TOTAL) BY MOUTH AT BEDTIME. 30 tablet 1   No current facility-administered medications on file prior to visit.    BP 110/68 mmHg  Pulse 77  Temp(Src) 98.2 F (36.8 C) (Oral)  Resp 16  Ht  (1.651 m)  Wt 185 lb 12.8 oz (84.278 kg)  BMI 30.92  kg/m2  SpO2 97%       Objective:   Physical Exam  Mental Status - Alert. General Appearance - Well groomed. Not in acute distress.  Skin Rashes- No Rashes.  HEENT Head- Normal. Ear Auditory Canal - Left- Normal. Right - Normal.Tympanic Membrane- Left- Normal. Right- Normal. Eye Sclera/Conjunctiva- Left- Normal. Right- Normal. Nose & Sinuses Nasal Mucosa- Left- Boggy and Congested. Right- Boggy and Congested.Bilateral maxillary sinus pressure and frontal sinus pressure. Mouth & Throat Lips: Upper Lip- Normal: no dryness, cracking, pallor, cyanosis, or vesicular eruption. Lower Lip-Normal: no dryness, cracking, pallor, cyanosis or vesicular eruption. Buccal Mucosa- Bilateral- No  Aphthous ulcers. Oropharynx- No Discharge or Erythema. Tonsils: Characteristics- Bilateral- No Erythema or Congestion. Size/Enlargement- Bilateral- No enlargement. Discharge- bilateral-None.  Neck Neck- Supple. No Masses.   Chest and Lung Exam Auscultation: Breath Sounds:-even unlabored. Only faint rough breath sound lelt lower lobe.  Cardiovascular Auscultation:Rythm- Regular, rate and rhythm. Murmurs & Other Heart Sounds:Ausculatation of the heart reveal- No Murmurs.  Lymphatic Head & Neck General Head & Neck Lymphatics: Bilateral: Description- No Localized lymphadenopathy.     Abdomen Inspection:-Inspection Normal.  Palpation/Perucssion: Palpation and Percussion of the abdomen reveal- faint left upper quadrant regionTender, No Rebound tenderness, No rigidity(Guarding) and No Palpable abdominal masses.  Liver:-Normal.  Spleen:- Normal.   Back- no cva tenderness.  .     Assessment & Plan:  Your symptoms from last time seem partially improved.   Your sinus are less painful but still present. We gave you rocephin and doxycyline.  Your cxr showed no infection. Your wbc was mild elevated. Will repeat this today.  Your atypical faint upper chest pain today does not present like  obvious cardiac in origin. Your ekg looks ok today (sinus rhythm). This pain has been faint and present for 5 days. I will get troponin stat outpt. I will refer you back to your cardiologist since you mentioned abnormality on loop recorder that they asked you about but then they never contacted you back. If you get any worse chest type discomfort then ED evaluation.  Your abdomen pain/seems to be Genella Rife like and seems to occur with using the doxycycline. So I want you to hold the doxycyline until temporarily to see if your abdomen pain improves. May rx other antibiotic such as cefdnir after I get labs back. Rx omeprazole 20 mg rx. Also give Gi cocktail today.   For you left upper quadrant and epigastric pain will add labs cmp, amylase, lipase, and get cbc.  Follow up 7 days or as needed.

## 2015-07-23 NOTE — Patient Instructions (Addendum)
Your symptoms from last time seem partially improved.   Your sinus are less painful but still present. We gave you rocephin and doxycyline.  Your cxr showed no infection. Your wbc was mild elevated. Will repeat this today.  Your atypical faint upper chest pain today does not present like obvious cardiac in origin. Your ekg looks ok today. This pain has been faint and present for 5 days. I will get troponin stat outpt. I will refer you back to your cardiologist since you mentioned abnormality on loop recorder that they asked you about but then they never contacted you back. If you get any worse chest type discomfort then ED evaluation.  Your abdomen pain/seems to be Dawn RifeGerd like and seems to occur with using the doxycycline. So I want you to hold the doxycyline until temporarily to see if your abdomen pain improves. May rx other antibiotic such as cefdnir after I get labs back. Rx omeprazole 20 mg rx. Also give Gi cocktail today.   For you left upper quadrant and epigastric pain will add labs cmp, amylase, lipase, and get cbc.  Follow up 7 days or as needed.

## 2015-07-25 ENCOUNTER — Ambulatory Visit (INDEPENDENT_AMBULATORY_CARE_PROVIDER_SITE_OTHER): Payer: Medicare Other | Admitting: Nurse Practitioner

## 2015-07-25 ENCOUNTER — Encounter: Payer: Self-pay | Admitting: Nurse Practitioner

## 2015-07-25 VITALS — BP 128/80 | HR 72 | Ht 65.0 in | Wt 184.0 lb

## 2015-07-25 DIAGNOSIS — I480 Paroxysmal atrial fibrillation: Secondary | ICD-10-CM

## 2015-07-25 DIAGNOSIS — Z720 Tobacco use: Secondary | ICD-10-CM | POA: Diagnosis not present

## 2015-07-25 DIAGNOSIS — I6340 Cerebral infarction due to embolism of unspecified cerebral artery: Secondary | ICD-10-CM | POA: Diagnosis not present

## 2015-07-25 DIAGNOSIS — I639 Cerebral infarction, unspecified: Secondary | ICD-10-CM

## 2015-07-25 MED ORDER — VERAPAMIL HCL ER 120 MG PO TBCR: 120.0000 mg | EXTENDED_RELEASE_TABLET | Freq: Every day | ORAL | Status: DC

## 2015-07-25 NOTE — Progress Notes (Signed)
Electrophysiology Office Note Date: 07/25/2015  ID:  Dawn Escobar, DOB [birth year], MRN [id number]  PCP: Esperanza RichtersSaguier, Edward, PA-C Electrophysiologist: Allred  CC: Atrial fibrillation follow up  Dawn Escobar is a 54 y.o. female seen today for Dr Johney FrameAllred.  She presents today for routine electrophysiology followup.  Since last being seen in our clinic, the patient reports doing reasonably well. She has persistent GI complaints with abdominal pain, dyspepsia, anorexia and esophageal burning.  She denies chest pain, palpitations, dyspnea, PND, orthopnea, nausea, vomiting, dizziness, syncope, edema, weight gain, or early satiety.  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 PTSD (post-traumatic stress disorder)   . Raynaud's disease   . Anxiety   . Hypercholesteremia   . Stroke (HCC)   . GERD (gastroesophageal reflux disease) 04/25/2015  . Hereditary and idiopathic peripheral neuropathy 04/25/2015  . Hypertension   . Atrial fibrillation (HCC)   . Anxiety   . Migraine    Past Surgical History  Procedure Laterality Date  . Wrist surgery    . Abdominal hysterectomy    . Knee surgery    . Hip surgery  Right  . Tee without cardioversion N/A 09/05/2014    Procedure: TRANSESOPHAGEAL ECHOCARDIOGRAM (TEE);  Surgeon: Wendall StadePeter C Nishan, MD;  [name];  Service: Cardiovascular;  Laterality: N/A;  loop after  . Loop recorder implant N/A 09/05/2014    MDT LINQ implanted by Dr Johney FrameAllred for cryptogenic stroke    Current Outpatient Prescriptions  Medication Sig Dispense Refill  . acetaminophen (TYLENOL) 500 MG tablet Take 500 mg by mouth every 6 (six) hours as needed for mild pain, moderate pain or headache.    Marland Kitchen. amitriptyline (ELAVIL) 25 MG tablet Take 1 tablet (25 mg total) by mouth daily. (Patient taking differently: Take 25 mg by mouth at bedtime. ) 90 tablet 3  . AMLODIPINE & DIET MANAGE PROD PO Take by mouth.    Marland Kitchen.  apixaban (ELIQUIS) 5 MG TABS tablet Take 1 tablet (5 mg total) by mouth 2 (two) times daily. 60 tablet 11  . benzonatate (TESSALON) 100 MG capsule Take 1 capsule (100 mg total) by mouth 3 (three) times daily as needed. 30 capsule 0  . clonazePAM (KLONOPIN) 0.5 MG tablet Take 1 tablet (0.5 mg total) by mouth 2 (two) times daily as needed (anxiety). 60 tablet 0  . esomeprazole (NEXIUM) 40 MG capsule Take 1 capsule (40 mg total) by mouth daily. 30 capsule 3  . fluticasone (FLONASE) 50 MCG/ACT nasal spray Place 2 sprays into both nostrils daily. 16 g 1  . gabapentin (NEURONTIN) 100 MG capsule TAKE 1 CAPSULE (100 MG TOTAL) BY MOUTH 3 (THREE) TIMES DAILY. 90 capsule 1  . methocarbamol (ROBAXIN) 500 MG tablet Take 500 mg by mouth at bedtime as needed for muscle spasms (take one daily by mouth at bedtime if needed).    Marland Kitchen. omeprazole (PRILOSEC) 20 MG capsule Take 1 capsule (20 mg total) by mouth daily. 30 capsule 1  . rosuvastatin (CRESTOR) 40 MG tablet Take 1 tablet (40 mg total) by mouth daily. 30 tablet 5  . sertraline (ZOLOFT) 25 MG tablet TAKE 1 TABLET (25 MG TOTAL) BY MOUTH DAILY. 30 tablet 2  . traZODone (DESYREL) 100 MG tablet TAKE 1 TABLET (100 MG TOTAL) BY MOUTH AT BEDTIME. 30 tablet 1  . doxycycline (VIBRA-TABS) 100 MG tablet Take 1 tablet (100 mg total) by mouth 2 (two) times daily. (Patient not taking: Reported  on 07/25/2015) 20 tablet 0  . verapamil (CALAN-SR) 120 MG CR tablet Take 1 tablet (120 mg total) by mouth daily. 30 tablet 5   No current facility-administered medications for this visit.    Allergies:   Morphine and related and Tape   Social History: Social History   Social History  . Marital Status: Single    Spouse Name: N/A  . Number of Children: 0  . Years of Education: 12th   Occupational History  . n/a    Social History Main Topics  . Smoking status: Former Smoker -- 0.20 packs/day    Types: Cigarettes  . Smokeless tobacco: Never Used  . Alcohol Use: 0.0 oz/week     0 Standard drinks or equivalent per week  . Drug Use: No  . Sexual Activity: Not on file   Other Topics Concern  . Not on file   Social History Narrative   Patient lives at home alone.   Caffeine use: 1 cup of coffee and 20 oz of soda    Family History: Family History  Problem Relation Age of Onset  . Colon cancer Brother   . Heart disease Mother   . Heart attack Father      Review of Systems: All other systems reviewed and are otherwise negative except as noted above.   Physical Exam: VS:  BP 128/80 mmHg  Pulse 72  Ht  (1.651 m)  Wt 184 lb (83.462 kg)  BMI 30.62 kg/m2 , BMI Body mass index is 30.62 kg/(m^2).  GEN- The patient is well appearing, alert and oriented x 3 today.   HEENT: normocephalic, atraumatic; sclera clear, conjunctiva pink; hearing intact; oropharynx clear; neck supple  Lungs- Clear to ausculation bilaterally, normal work of breathing.  No wheezes, rales, rhonchi Heart- Regular rate and rhythm, no murmurs, rubs or gallops  GI- soft, non-tender, non-distended, bowel sounds present  Extremities- no clubbing, cyanosis, or edema; DP/PT/radial pulses 2+ bilaterally MS- no significant deformity or atrophy Skin- warm and dry, no rash or lesion; ILR incision well healed Psych- euthymic mood, full affect Neuro- strength and sensation are intact  ILR Interrogation- reviewed in detail today,  See PACEART report  EKG:  EKG is not ordered today.  Recent Labs: 08/31/2014: TSH 2.190 07/23/2015: ALT 11; BUN 9; Creat 0.87; Hemoglobin 13.8; Platelets 185; Potassium 4.5; Sodium 141   Wt Readings from Last 3 Encounters:  07/25/15 184 lb (83.462 kg)  07/23/15 185 lb 12.8 oz (84.278 kg)  07/18/15 186 lb (84.369 kg)     Other studies Reviewed: Additional studies/ records that were reviewed today include: Dr Jenel Lucks notes  Assessment and Plan:  1.  Paroxysmal atrial fibrillation Burden 0% by device interrogation today Continue Eliquis for CHADS2VASC of  at least 4 Recent CBC, BMET normal  2.  Stroke Follow up with neurology as scheduled  3.  Tobacco abuse Discussed cessation for 3 minutes today  4.  SVT 2 episodes that have been relatively symptomatic Will change Amlodipine to Verapamil today to help with SVT as well as blood pressure Will follow burden through [facility]   Current medicines are reviewed at length with the patient today.   The patient does not have concerns regarding her medicines.  The following changes were made today:  Discontinue amlodipine and add verapamil  daily  Labs/ tests ordered today include: none   Disposition:   Follow up with me in 6 months    Signed, Gypsy Balsam, NP 07/25/2015 5:35 PM  [facility]  [address] [place] [address] [phone] (office) [phone] (fax)

## 2015-07-25 NOTE — Patient Instructions (Addendum)
Medication Instructions:   STOP TAKING AMLODIPINE   START TAKING VERAPAMIL 120 MG ONCE A DAY    Labwork: NONE ORDER TODAY    Testing/Procedures: NONE ORDER TODAY    Follow-Up:  Your physician wants you to follow-up in:  IN  6  MONTHS WITH AMBER SEILER NP  You will receive a reminder letter in the mail two months in advance. If you don't receive a letter, please call our office to schedule the follow-up appointment.      Any Other Special Instructions Will Be Listed Below (If Applicable).

## 2015-07-27 ENCOUNTER — Ambulatory Visit: Payer: Medicare Other | Admitting: Medical

## 2015-07-27 ENCOUNTER — Other Ambulatory Visit: Payer: Self-pay

## 2015-07-27 MED ORDER — CLONAZEPAM 0.5 MG PO TABS: 0.5000 mg | ORAL_TABLET | Freq: Two times a day (BID) | ORAL | Status: DC | PRN

## 2015-07-27 NOTE — Telephone Encounter (Signed)
Please advise if ok to print refill.

## 2015-07-27 NOTE — Telephone Encounter (Signed)
rx refill of her clonzapam. Pt can pick up rx.

## 2015-07-31 ENCOUNTER — Encounter: Payer: Self-pay | Admitting: Rehabilitation

## 2015-07-31 ENCOUNTER — Ambulatory Visit: Payer: Medicare Other | Attending: Medical | Admitting: Rehabilitation

## 2015-07-31 ENCOUNTER — Ambulatory Visit: Payer: Medicare Other

## 2015-07-31 ENCOUNTER — Telehealth: Payer: Self-pay | Admitting: Rehabilitation

## 2015-07-31 DIAGNOSIS — R269 Unspecified abnormalities of gait and mobility: Secondary | ICD-10-CM | POA: Diagnosis not present

## 2015-07-31 DIAGNOSIS — I69351 Hemiplegia and hemiparesis following cerebral infarction affecting right dominant side: Secondary | ICD-10-CM | POA: Diagnosis not present

## 2015-07-31 DIAGNOSIS — R4701 Aphasia: Secondary | ICD-10-CM | POA: Insufficient documentation

## 2015-07-31 DIAGNOSIS — R2681 Unsteadiness on feet: Secondary | ICD-10-CM | POA: Diagnosis not present

## 2015-07-31 DIAGNOSIS — IMO0002 Reserved for concepts with insufficient information to code with codable children: Secondary | ICD-10-CM

## 2015-07-31 DIAGNOSIS — I69922 Dysarthria following unspecified cerebrovascular disease: Secondary | ICD-10-CM | POA: Insufficient documentation

## 2015-07-31 DIAGNOSIS — M5441 Lumbago with sciatica, right side: Secondary | ICD-10-CM | POA: Insufficient documentation

## 2015-07-31 NOTE — Patient Instructions (Signed)
  Please complete the assigned speech therapy homework prior to your next session.  

## 2015-07-31 NOTE — Therapy (Signed)
[facility] [address] [place], [place], [address] Phone: [phone]   Fax:  [phone]  Physical Therapy Evaluation  Patient Details  Name: Dawn Escobar MRN: [id number] Date of Birth: 02-25-1961 No Data Recorded  Encounter Date: 07/31/2015      PT End of Session - 07/31/15 1831    Visit Number 1   Number of Visits 17   Date for PT Re-Evaluation 09/29/15   Authorization Type MCR-G codes every 10th visit   PT Start Time 1448   PT Stop Time 1535   PT Time Calculation (min) 47 min   Activity Tolerance Patient tolerated treatment well   Behavior During Therapy [facility] for tasks assessed/performed      Past Medical History  Diagnosis Date  . PTSD (post-traumatic stress disorder)   . Raynaud's disease   . Anxiety   . Hypercholesteremia   . Stroke (HCC)   . GERD (gastroesophageal reflux disease) 04/25/2015  . Hereditary and idiopathic peripheral neuropathy 04/25/2015  . Hypertension   . Atrial fibrillation (HCC)   . Anxiety   . Migraine     Past Surgical History  Procedure Laterality Date  . Wrist surgery    . Abdominal hysterectomy    . Knee surgery    . Hip surgery  Right  . Tee without cardioversion N/A 09/05/2014    Procedure: TRANSESOPHAGEAL ECHOCARDIOGRAM (TEE);  Surgeon: Wendall Stade, MD;  Location: [facility] ENDOSCOPY;  Service: Cardiovascular;  Laterality: N/A;  loop after  . 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  Visit Diagnosis:  Abnormality of gait - Plan: PT plan of care cert/re-cert  Unsteadiness - Plan: PT plan of care cert/re-cert  Hemiparesis affecting right side as late effect of stroke (HCC) - Plan: PT plan of care cert/re-cert  Right-sided low back pain with right-sided sciatica - Plan: PT plan of care cert/re-cert      Subjective Assessment - 07/31/15 1503    Subjective Pt  reports coming to therapy due to stroke.  "My R arm and leg is still weak."  "My back hurts from my shoulder.  I've noticied that I can't use my hand to open things as well as I used to be able to."    Pertinent History CVA with right hemiparesis 08/30/14, and again on 06/01/15, anxiety, PTSD, Raynauds   Diagnostic tests MRI 06/01/15: Early subacute acute left MCA territory cortical infarct    Currently in Pain? Yes   Pain Score 6   "good day"   Pain Location Back   Pain Orientation Lower;Right   Pain Type Chronic pain   Pain Radiating Towards R leg   Pain Onset More than a month ago   Pain Frequency Constant   Aggravating Factors  "doing too much" and trying to sleep   Pain Relieving Factors "nothing really"             [facility] PT Assessment - 07/31/15 0001    Assessment   Medical Diagnosis R UE weakness s/p CVA   Onset Date/Surgical Date 06/01/15   Hand Dominance Left   Precautions   Precautions Fall   Balance Screen   Has the patient fallen in the past 6 months No   Has the patient had a decrease in activity level because of a fear of falling?  No   Is the patient reluctant to leave their  home because of a fear of falling?  No   Home Environment   Living Environment Other (Comment)  motel   Living Arrangements Alone   Available Help at Discharge Other (Comment)  fiance   Type of Home Other(Comment)  motel   Home Access Level entry   Home Layout Two level   Alternate Level Stairs-Number of Steps 10  when she has to do laundry   Alternate Level Stairs-Rails Left   Home Equipment Shower seat  uses outside of tub to sit on and dry off   Additional Comments Motel   Prior Function   Level of Independence Independent   Vocation On disability   Leisure Try to get out at least 1x/day   Cognition   Overall Cognitive Status Impaired/Different from baseline   Area of Impairment Memory;Attention   Observation/Other Assessments   Focus on Therapeutic Outcomes (FOTO)  58% (42%  limitation)  as taken on 06/18/15   Sensation   Light Touch Impaired Detail   Light Touch Impaired Details Impaired RUE;Impaired RLE  increased sensitivity on medial aspect of R LE   Additional Comments reports occasional numbess and tingling down right arm in thumb, 2nd and 3rd digits, but is more intermittent   Coordination   Gross Motor Movements are Fluid and Coordinated No   Fine Motor Movements are Fluid and Coordinated No   Coordination and Movement Description Ataxic   Finger Nose Finger Test Ataxic with target approximation   Heel [facility]   ROM / Strength   AROM / PROM / Strength Strength   AROM   Right Shoulder Flexion --   Right Shoulder ABduction --   Right Shoulder Internal Rotation --   Right Shoulder External Rotation --   Left Shoulder Flexion --   Left Shoulder ABduction --   Left Shoulder Internal Rotation --   Left Shoulder External Rotation --   Strength   Overall Strength Deficits   Overall Strength Comments RLE hip abd 3/5, add 3/5, hip flex 3+/5 w/ jerky movements noted, knee flex 3+/5 w/ jerky mvmts, knee ext 4/5 w/ jerky movements, ankle DF and PF 4/5 w/ jerky movements   Right Shoulder Flexion --   Right Shoulder ABduction --   Right Shoulder Internal Rotation --   Right Shoulder External Rotation --   Right Elbow Flexion --   Right Elbow Extension --   Right Forearm Pronation --   Right Forearm Supination --   Right Wrist Flexion --   Right Wrist Extension --   Right Wrist Radial Deviation --   Right Wrist Ulnar Deviation --   Right Hand Grip (lbs) --   Left Hand Grip (lbs) --   Transfers   Transfers Sit to Stand;Stand to Sit   Sit to Stand 6: Modified independent (Device/Increase time)   Stand to Sit 6: Modified independent (Device/Increase time)   Ambulation/Gait   Ambulation/Gait Yes   Ambulation/Gait Assistance 6: Modified independent (Device/Increase time)   Ambulation Distance (Feet) 230 Feet   Assistive device None   Gait  Pattern Step-through pattern;Decreased stance time - right;Decreased stride length;Decreased weight shift to right;Trendelenburg;Antalgic;Lateral hip instability   Ambulation Surface Level;Indoor   Gait velocity 3.65 ft/sec   Stairs Yes   Stairs Assistance 6: Modified independent (Device/Increase time)   Stair Management Technique No rails;Alternating pattern;Step to pattern;Forwards   Number of Stairs 4   Height of Stairs 6   Balance   Balance Assessed Yes   Standardized Balance Assessment  Standardized Balance Assessment Dynamic Gait Index   Dynamic Gait Index   Level Surface Normal   Change in Gait Speed Normal   Gait with Horizontal Head Turns Moderate Impairment   Gait with Vertical Head Turns Moderate Impairment   Gait and Pivot Turn Normal   Step Over Obstacle Mild Impairment   Step Around Obstacles Normal   Steps Mild Impairment   Total Score 18   DGI comment: Scores of 19 or less are predictive of falls in older community living adults   RUE Tone   RUE Tone Mild;Hypotonic                           PT Education - 07/31/15 1830    Education provided Yes   Education Details Pt educated on evaluation findings, POC   Person(s) Educated Patient   Methods Explanation   Comprehension Verbalized understanding          PT Short Term Goals - 07/31/15 1837    PT SHORT TERM GOAL #1   Title Pt will initiate HEP for strengthening and balance to decrease fall risk and improve functional mobility.  (Target Date: 08/28/15)   Time 4   Period Weeks   PT SHORT TERM GOAL #2   Title Pt will increase DGI to 22/24 in order to indicate increase in functional balance and decreased fall risk.    (Target Date: 08/28/15)   Time 4   Period Weeks   PT SHORT TERM GOAL #3   Title Pt will verbalize CVA warning signs independently to indicate decreased time to seek medical attention in case of future CVA.   (Target Date: 08/28/15)   Time 4   Period Weeks   PT SHORT TERM  GOAL #4   Title Pt will report no more than 2 point increase in back pain during therapy session to indicate that pain is decreased limiting factor in mobility.   (Target Date: 08/28/15)   Time 4   Period Weeks   PT SHORT TERM GOAL #5   Title Will assess and increase by 150' in order to indicate increase in fucntional endurance.    (Target Date: 08/28/15)           PT Long Term Goals - 07/31/15 1843    PT LONG TERM GOAL #1   Title Pt will be independent with HEP for strengthening and balance in order to indicate decreased fall risk and improved functional mobility. (Target Date: 09/25/15)   Time 8   Period Weeks   PT LONG TERM GOAL #2   Title Pt will increase R hip strength to 4/5 to indicate increased strength and improved functional mobility.   (Target Date: 09/25/15)   Time 8   Period Weeks   PT LONG TERM GOAL #3   Title Pt will initiate participation in community fitness program to continue to improve functional mobility beyond therapy.   (Target Date: 09/25/15)   Time 8   Period Weeks   PT LONG TERM GOAL #4   Title Pt will ambulate 500' without AD over outdoor surfaces (paved, grass, curb, etc) at mod I level to indicate safe return to community.   (Target Date: 09/25/15)   Time 8   PT LONG TERM GOAL #5   Title Pt will increase by 150' to indicate increase in functional endurance.   (Target Date: 09/25/15)  Plan - 08/08/15 1832    Clinical Impression Statement Pt presents with L CVA and resulting R hemiparesis on 08/30/14 and another L MCA CVA on 06/01/15.  Pt demonstrates increased weakness in RLE with all testing and is noted to have "jerky" movements with testing that could not be re-produced with tone testing.  Also note increased pain in R low back radiating to R LE, causing gait abnormality and antalgic gait pattern (in addition to strength deficits causing gait dysfunction).  Upon PT evaluation, note DGI to be 18/24 and strength no more than  3+/5 in RLE.  Based on findings and current deficits, feel that pt would benefit from skilled OP neuro PT to address these.     Pt will benefit from skilled therapeutic intervention in order to improve on the following deficits Abnormal gait;Decreased activity tolerance;Decreased balance;Decreased cognition;Decreased coordination;Decreased endurance;Decreased mobility;Decreased strength;Difficulty walking;Impaired perceived functional ability;Impaired sensation;Impaired UE functional use;Postural dysfunction;Improper body mechanics;Dizziness   Rehab Potential Good   PT Frequency 2x / week   PT Duration 8 weeks   PT Treatment/Interventions ADLs/Self Care Home Management;Electrical Stimulation;Moist Heat;Ultrasound;Gait training;Stair training;Functional mobility training;Therapeutic activities;Therapeutic exercise;Neuromuscular re-education;Balance training;Patient/family education;Manual techniques;Vestibular   PT Next Visit Plan Provide with R hip strengthening HEP, educate on CVA warning signs, high level balance   Recommended Other Services PT sent telephone encounter to MD regarding need for OT order.    Consulted and Agree with Plan of Care Patient          G-Codes - 2015/08/08 1849    Functional Assessment Tool Used DGI: 18/24   Functional Limitation Mobility: Walking and moving around   Mobility: Walking and Moving Around Current Status [phone]) At least 20 percent but less than 40 percent impaired, limited or restricted   Mobility: Walking and Moving Around Goal Status [phone]) At least 1 percent but less than 20 percent impaired, limited or restricted       Problem List Patient Active Problem List   Diagnosis Date Noted  . Generalized anxiety disorder 04/25/2015  . History of depression 04/25/2015  . Raynaud's disease 04/25/2015  . GERD (gastroesophageal reflux disease) 04/25/2015  . Neuropathy (HCC) 04/25/2015  . Neck pain 04/25/2015  . Atrial fibrillation (HCC) 10/14/2014  .  Acute CVA (cerebrovascular accident) (HCC)   . Rash and nonspecific skin eruption   . Cerebral embolism with cerebral infarction (HCC)   . 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 Tobacco abuse   . TIA (transient ischemic attack) 08/30/2014  . Hypercholesterolemia 08/30/2014    Harriet Butte, PT, MPT [facility] [address] [place], [place], [address] Phone: [phone]   Fax:  [phone] 08/08/2015, 6:53 PM  Name: Dawn Escobar MRN: [id number] Date of Birth: March 30, 1961

## 2015-07-31 NOTE — Telephone Encounter (Signed)
PT evaluation was completed yesterday. Due to limited functional use and pain of RUE, patient would benefit from outpatient OT.  If you agree, please submit an order for outpatient OT.  Thanks so much, Dawn Escobar, PT, MPT [facility] [address] [place], [place], [address] Phone: [phone]   Fax:  [phone] 07/31/2015, 6:55 PM

## 2015-07-31 NOTE — Therapy (Signed)
[facility] [address] [place], [place], [address] Phone: [phone]   Fax:  [phone]  Speech Language Pathology Treatment  Patient Details  Name: Dawn Escobar MRN: [id number] Date of Birth: 1961/03/05 No Data Recorded  Encounter Date: 07/31/2015      End of Session - 07/31/15 1633    Visit Number 2   Number of Visits 16   Date for SLP Re-Evaluation 08/27/15   SLP Start Time 1535   SLP Stop Time  1615   SLP Time Calculation (min) 40 min   Activity Tolerance Patient tolerated treatment well      Past Medical History  Diagnosis Date  . PTSD (post-traumatic stress disorder)   . Raynaud's disease   . Anxiety   . Hypercholesteremia   . Stroke (HCC)   . GERD (gastroesophageal reflux disease) 04/25/2015  . Hereditary and idiopathic peripheral neuropathy 04/25/2015  . Hypertension   . Atrial fibrillation (HCC)   . Anxiety   . Migraine     Past Surgical History  Procedure Laterality Date  . Wrist surgery    . Abdominal hysterectomy    . Knee surgery    . Hip surgery  Right  . Tee without cardioversion N/A 09/05/2014    Procedure: TRANSESOPHAGEAL ECHOCARDIOGRAM (TEE);  Surgeon: Wendall Stade, MD;  Location: [facility] ENDOSCOPY;  Service: Cardiovascular;  Laterality: N/A;  loop after  . 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  Visit Diagnosis: Receptive aphasia  Dysarthria due to cerebrovascular accident      Subjective Assessment - 07/31/15 1539    Subjective "I can't fill out the (financial aid) paperwork."               ADULT SLP TREATMENT - 07/31/15 1542    General Information   Behavior/Cognition Alert;Cooperative;Pleasant mood   Treatment Provided   Treatment provided Cognitive-Linquistic   Pain Assessment   Pain Assessment 0-10   Pain Score 8    Pain Location rt leg radiating to  lower back   Pain Descriptors / Indicators Aching;Shooting;Sharp   Pain Intervention(s) Monitored during session   Cognitive-Linquistic Treatment   Treatment focused on Aphasia   Skilled Treatment SLP spoke with pt re: feelings post CVA, encouraged talking to her MD about referral to neuropsych PRN. Pt's speech appears to have improved since last visit (eval). In responsive naming tasks, pt req'd min A consistently, with phonemic cues assisting most successfully.    Assessment / Recommendations / Plan   Plan Continue with current plan of care   Progression Toward Goals   Progression toward goals Progressing toward goals          SLP Education - 07/31/15 1633    Education provided Yes   Education Details neuropsych services   Person(s) Educated Patient   Methods Explanation   Comprehension Verbalized understanding          SLP Short Term Goals - 07/31/15 1636    SLP SHORT TERM GOAL #1   Title Pt will name 8 items for a simple category with occasional min A   Time 4   Period Weeks   Status On-going   SLP SHORT TERM GOAL #2   Title Pt will comprehend simple functional written material (menus, schedules, directions) with 75% accuracy and occasional minb A   Time 4   Period Weeks   Status  On-going   SLP SHORT TERM GOAL #3   Title Pt will write simple sentences and functional information (forms, orders, lists) with 80% accuracy and occasional min A.    Time 4   Period Weeks   Status On-going   SLP SHORT TERM GOAL #4   Title Pt will utlize compensations for dysarthira during structured tasks with occasional min A   Baseline 4   Period Weeks   Status On-going          SLP Long Term Goals - 07/31/15 1636    SLP LONG TERM GOAL #1   Title Pt will participate in 8 minute simple conversation with compensations for dysarthria and aphasia with occasional min A   Time 8   Period Weeks   Status On-going   SLP LONG TERM GOAL #2   Title Pt will comprhend simple 3-5 sentence  paragraph with 80% accuracy and rare min A   Time 8   Period Weeks   Status On-going          Plan - 07/31/15 1633    Clinical Impression Statement Dysarthria appears to have resolved, but SLP will wait to remove from diagnosis. Pt cont to present with expressive aphasia, mild in severity. Pt cont to express difficulty with writing but these skills were not targeted today. Skilled ST to cont to address stated deficits in order to improve pt's expressive language and thus her QOL.   Speech Therapy Frequency 2x / week   Duration --  8 weeks   Treatment/Interventions Compensatory techniques;Patient/family education;Functional tasks;Cueing hierarchy;Multimodal communcation approach;SLP instruction and feedback;Internal/external aids   Potential to Achieve Goals Good        Problem List Patient Active Problem List   Diagnosis Date Noted  . Generalized anxiety disorder 04/25/2015  . History of depression 04/25/2015  . Raynaud's disease 04/25/2015  . GERD (gastroesophageal reflux disease) 04/25/2015  . Neuropathy (HCC) 04/25/2015  . Neck pain 04/25/2015  . Atrial fibrillation (HCC) 10/14/2014  . Acute CVA (cerebrovascular accident) (HCC)   . Rash and nonspecific skin eruption   . Cerebral embolism with cerebral infarction (HCC)   . 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 Tobacco abuse   . TIA (transient ischemic attack) 08/30/2014  . Hypercholesterolemia 08/30/2014    [facility] Fair[facility]ntCHINKE,Fouad Taul , MS, CCC-SLP  07/31/2015, 4:37 PM  [facility] [address] [place], [place], [address] Phone: [phone]   Fax:  [phone]   Name: Philipp OvensDenise Birchard MRN: [id number] Date of Birth: 04-07-1961

## 2015-08-01 ENCOUNTER — Other Ambulatory Visit: Payer: Self-pay | Admitting: Neurology

## 2015-08-01 ENCOUNTER — Other Ambulatory Visit: Payer: Self-pay

## 2015-08-01 DIAGNOSIS — G8110 Spastic hemiplegia affecting unspecified side: Secondary | ICD-10-CM

## 2015-08-01 MED ORDER — CLONAZEPAM 0.5 MG PO TABS: 0.5000 mg | ORAL_TABLET | Freq: Two times a day (BID) | ORAL | Status: DC | PRN

## 2015-08-01 NOTE — Telephone Encounter (Signed)
Called pharmacy and spoke with Tammy and she will cancel the electronic Rx due to pt bringing in hard copy. Cody refilled Rx this time due to Edward's absence. Rx will be faxed for future refills.

## 2015-08-01 NOTE — Telephone Encounter (Signed)
Agree . i will place order for OT

## 2015-08-01 NOTE — Telephone Encounter (Deleted)
Pt

## 2015-08-02 ENCOUNTER — Ambulatory Visit: Payer: Medicare Other

## 2015-08-02 ENCOUNTER — Ambulatory Visit: Payer: Medicare Other | Admitting: Physical Therapy

## 2015-08-02 DIAGNOSIS — R269 Unspecified abnormalities of gait and mobility: Secondary | ICD-10-CM

## 2015-08-02 DIAGNOSIS — M5441 Lumbago with sciatica, right side: Secondary | ICD-10-CM

## 2015-08-02 DIAGNOSIS — I69351 Hemiplegia and hemiparesis following cerebral infarction affecting right dominant side: Secondary | ICD-10-CM | POA: Diagnosis not present

## 2015-08-02 DIAGNOSIS — R4701 Aphasia: Secondary | ICD-10-CM

## 2015-08-02 DIAGNOSIS — R2681 Unsteadiness on feet: Secondary | ICD-10-CM | POA: Diagnosis not present

## 2015-08-02 DIAGNOSIS — I69922 Dysarthria following unspecified cerebrovascular disease: Secondary | ICD-10-CM | POA: Diagnosis not present

## 2015-08-02 NOTE — Therapy (Signed)
[facility] [address] [place], [place], [address] Phone: [phone]   Fax:  [phone]  Speech Language Pathology Treatment  Patient Details  Name: Dawn Escobar MRN: [id number] Date of Birth: [birth year] No Data Recorded  Encounter Date: 08/02/2015      End of Session - 08/02/15 1642    Visit Number 3   Number of Visits 17   Date for SLP Re-Evaluation 08/27/15   SLP Start Time 1449   SLP Stop Time  1530   SLP Time Calculation (min) 41 min   Activity Tolerance Patient tolerated treatment well      Past Medical History  Diagnosis Date  . PTSD (post-traumatic stress disorder)   . Raynaud's disease   . Anxiety   . Hypercholesteremia   . Stroke (HCC)   . GERD (gastroesophageal reflux disease) 04/25/2015  . Hereditary and idiopathic peripheral neuropathy 04/25/2015  . Hypertension   . Atrial fibrillation (HCC)   . Anxiety   . Migraine     Past Surgical History  Procedure Laterality Date  . Wrist surgery    . Abdominal hysterectomy    . Knee surgery    . Hip surgery  Right  . Tee without cardioversion N/A 09/05/2014    Procedure: TRANSESOPHAGEAL ECHOCARDIOGRAM (TEE);  Surgeon: Wendall StadePeter C Nishan, MD;  [facility];  Service: Cardiovascular;  Laterality: N/A;  loop after  . Loop recorder implant N/A 09/05/2014    MDT LINQ implanted by Dr Johney FrameAllred for cryptogenic stroke    There were no vitals filed for this visit.  Visit Diagnosis: Receptive aphasia  Expressive aphasia      Subjective Assessment - 08/02/15 1453    Subjective "I brought the financial aid paperwork."               ADULT SLP TREATMENT - 08/02/15 1454    General Information   Behavior/Cognition Alert;Cooperative;Pleasant mood   Treatment Provided   Treatment provided Cognitive-Linquistic   Pain Assessment   Pain Assessment 0-10   Pain Score 8    Pain Location rt radiating to lower back   Pain Descriptors /  Indicators Aching   Pain Intervention(s) Monitored during session   Cognitive-Linquistic Treatment   Treatment focused on Aphasia   Skilled Treatment SLP worked with pt on written language expression with written practice prior to writing anything on the form today by filling out financial aid form. Pt req'd min A rarely for form information. Pt asked SLP to clarify information on the form however SLP feels this was more due to pt background/lack of knowledge how to fill out forms than aphasia.   Assessment / Recommendations / Plan   Plan Continue with current plan of care   Progression Toward Goals   Progression toward goals Progressing toward goals            SLP Short Term Goals - 08/02/15 1644    SLP SHORT TERM GOAL #1   Title Pt will name 8 items for a simple category with occasional min A   Time 4   Period Weeks   Status On-going   SLP SHORT TERM GOAL #2   Title Pt will comprehend simple functional written material (menus, schedules, directions) with 75% accuracy and occasional minb A   Status Achieved   SLP SHORT TERM GOAL #3   Title Pt will write simple sentences and functional information (forms, orders, lists) with 80% accuracy and occasional min A.    Status Achieved  SLP SHORT TERM GOAL #4   Title Pt will utlize compensations for dysarthira during structured tasks with occasional min A   Status Achieved          SLP Long Term Goals - 08/02/15 1645    SLP LONG TERM GOAL #1   Title Pt will participate in 8 minute simple conversation with compensations for dysarthria and aphasia with occasional min A   Status Achieved   SLP LONG TERM GOAL #2   Title Pt will comprhend simple 3-5 sentence paragraph with 80% accuracy and rare min A   Time 8   Period Weeks   Status On-going   SLP LONG TERM GOAL #3   Title pt will demo written language appropriate for functional tasks 95% success   Time 8   Period Weeks   Status New          Plan - 08/02/15 1643     Clinical Impression Statement Dysarthria appears to have resolved, SLP has removed from tx diagnosis. Pt cont to present with expressive aphasia, mild in severity. Pt cont to express difficulty with writing, seen today in rare written aphasia. Skilled ST to cont to address stated deficits in order to improve pt's expressive language and thus her QOL.   Speech Therapy Frequency 2x / week   Duration --  8 weeks   Treatment/Interventions Compensatory techniques;Patient/family education;Functional tasks;Cueing hierarchy;Multimodal communcation approach;SLP instruction and feedback;Internal/external aids   Potential to Achieve Goals Good        Problem List Patient Active Problem List   Diagnosis Date Noted  . Generalized anxiety disorder 04/25/2015  . History of depression 04/25/2015  . Raynaud's disease 04/25/2015  . GERD (gastroesophageal reflux disease) 04/25/2015  . Neuropathy (HCC) 04/25/2015  . Neck pain 04/25/2015  . Atrial fibrillation (HCC) 10/14/2014  . Acute CVA (cerebrovascular accident) (HCC)   . Rash and nonspecific skin eruption   . Cerebral embolism with cerebral infarction (HCC)   . 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 Tobacco abuse   . TIA (transient ischemic attack) 08/30/2014  . Hypercholesterolemia 08/30/2014    [facility] , MS, CCC-SLP  08/02/2015, 4:47 PM  [facility] [address] [place], [place], [address] Phone: [phone]   Fax:  [phone]   Name: Dawn Escobar MRN: [id number] Date of Birth: 01-08-1961

## 2015-08-05 ENCOUNTER — Encounter: Payer: Self-pay | Admitting: Physical Therapy

## 2015-08-05 NOTE — Therapy (Signed)
[facility] [address] [place], [place], [address] Phone: [phone]   Fax:  [phone]  Physical Therapy Treatment  Patient Details  Name: Dawn [name] MRN: [id number] Date of Birth: Mar 29, 1961 No Data Recorded  Encounter Date: 08/02/2015      PT End of Session - 08/05/15 2136    Visit Number 2   Number of Visits 17   Date for PT Re-Evaluation 09/29/15   Authorization Type MCR-G codes every 10th visit   PT Start Time 1535   PT Stop Time 1618   PT Time Calculation (min) 43 min      Past Medical History  Diagnosis Date  . PTSD (post-traumatic stress disorder)   . Raynaud's disease   . Anxiety   . Hypercholesteremia   . Stroke (HCC)   . GERD (gastroesophageal reflux disease) 04/25/2015  . Hereditary and idiopathic peripheral neuropathy 04/25/2015  . Hypertension   . Atrial fibrillation (HCC)   . Anxiety   . Migraine     Past Surgical History  Procedure Laterality Date  . Wrist surgery    . Abdominal hysterectomy    . Knee surgery    . Hip surgery  Right  . Tee without cardioversion N/A 09/05/2014    Procedure: TRANSESOPHAGEAL ECHOCARDIOGRAM (TEE);  Surgeon: Wendall StadePeter C Nishan, MD;  [facility];  Service: Cardiovascular;  Laterality: N/A;  loop after  . Loop recorder implant N/A 09/05/2014    MDT LINQ implanted by Dr Johney FrameAllred for cryptogenic stroke    There were no vitals filed for this visit.  Visit Diagnosis:  Hemiparesis affecting right side as late effect of stroke (HCC)  Abnormality of gait  Right-sided low back pain with right-sided sciatica      Subjective Assessment - 08/05/15 2127    Subjective Pt reports R leg shaking involuntarily at times; cont to c/o pain in back - reports OT referral has been requested   Pertinent History CVA with right hemiparesis 08/30/14, and again on 06/01/15, anxiety, PTSD, Raynauds   Diagnostic tests MRI 06/01/15: Early subacute acute left MCA  territory cortical infarct    Currently in Pain? Yes   Pain Score 6    Pain Location Back   Pain Orientation Right;Lower   Pain Descriptors / Indicators Aching   Pain Type Chronic pain   Pain Radiating Towards down R leg   Pain Onset More than a month ago   Pain Frequency Constant                         [facility] Adult PT Treatment/Exercise - 08/05/15 0001    Lumbar Exercises: Stretches   Active Hamstring Stretch 1 rep;30 seconds  RLE - runner's stretch in standing   Single Knee to Chest Stretch 1 rep;20 seconds   Single Knee to Chest Stretch Limitations low back pain and tremors in RLE   Double Knee to Chest Stretch 1 rep;20 seconds   Lower Trunk Rotation 2 reps;20 seconds  to L for R side stretch   Pelvic Tilt Other (comment)  10 reps - 3 sec hold   Quadruped Mid Back Stretch 3 reps;10 seconds  cat/camel stretch   Quadruped Mid Back Stretch Limitations limited pelvic mobility   Lumbar Exercises: Aerobic   Stationary Bike SciFit level 1.5 x 5"   Lumbar Exercises: Supine   Clam 10 reps  RLE - with 3# weight   Heel Slides 10 reps  RLE   Bent Knee  Raise 10 reps   Straight Leg Raise 10 reps   Other Supine Lumbar Exercises R hip flexion/extension control exercise with R knee flexed - on and off mat x 10 reps   Lumbar Exercises: Sidelying   Hip Abduction 5 reps  RLE     Standing heel cord stretch with 2" step - 30 sec hold for RLE - 1 rep           PT Education - 08/05/15 2135    Education provided Yes   Education Details back packet exercise booklet and stretches for hamstring and heel cord - in standing   Person(s) Educated Patient   Methods Explanation;Demonstration;Handout   Comprehension Verbalized understanding;Returned demonstration          PT Short Term Goals - 07/31/15 1837    PT SHORT TERM GOAL #1   Title Pt will initiate HEP for strengthening and balance to decrease fall risk and improve functional mobility.  (Target Date: 08/28/15)    Time 4   Period Weeks   PT SHORT TERM GOAL #2   Title Pt will increase DGI to 22/24 in order to indicate increase in functional balance and decreased fall risk.    (Target Date: 08/28/15)   Time 4   Period Weeks   PT SHORT TERM GOAL #3   Title Pt will verbalize CVA warning signs independently to indicate decreased time to seek medical attention in case of future CVA.   (Target Date: 08/28/15)   Time 4   Period Weeks   PT SHORT TERM GOAL #4   Title Pt will report no more than 2 point increase in back pain during therapy session to indicate that pain is decreased limiting factor in mobility.   (Target Date: 08/28/15)   Time 4   Period Weeks   PT SHORT TERM GOAL #5   Title Will assess and increase by 150' in order to indicate increase in fucntional endurance.    (Target Date: 08/28/15)           PT Long Term Goals - 07/31/15 1843    PT LONG TERM GOAL #1   Title Pt will be independent with HEP for strengthening and balance in order to indicate decreased fall risk and improved functional mobility. (Target Date: 09/25/15)   Time 8   Period Weeks   PT LONG TERM GOAL #2   Title Pt will increase R hip strength to 4/5 to indicate increased strength and improved functional mobility.   (Target Date: 09/25/15)   Time 8   Period Weeks   PT LONG TERM GOAL #3   Title Pt will initiate participation in community fitness program to continue to improve functional mobility beyond therapy.   (Target Date: 09/25/15)   Time 8   Period Weeks   PT LONG TERM GOAL #4   Title Pt will ambulate 500' without AD over outdoor surfaces (paved, grass, curb, etc) at mod I level to indicate safe return to community.   (Target Date: 09/25/15)   Time 8   PT LONG TERM GOAL #5   Title Pt will increase by 150' to indicate increase in functional endurance.   (Target Date: 09/25/15)               Plan - 08/05/15 2136    Clinical Impression Statement Pt presents with increased tone/spasticity in  RLE with tremors noted with certain exercises - dystonia?   Pt will benefit from skilled therapeutic intervention in order to improve  on the following deficits Abnormal gait;Decreased activity tolerance;Decreased balance;Decreased cognition;Decreased coordination;Decreased endurance;Decreased mobility;Decreased strength;Difficulty walking;Impaired perceived functional ability;Impaired sensation;Impaired UE functional use;Postural dysfunction;Improper body mechanics;Dizziness   Rehab Potential Good   PT Frequency 2x / week   PT Duration 8 weeks   PT Treatment/Interventions ADLs/Self Care Home Management;Electrical Stimulation;Moist Heat;Ultrasound;Gait training;Stair training;Functional mobility training;Therapeutic activities;Therapeutic exercise;Neuromuscular re-education;Balance training;Patient/family education;Manual techniques;Vestibular   PT Next Visit Plan check HEP; CVA education - continue strengthening RLE   PT Home Exercise Plan back packet exercises and stretches - heel cord and hamstring   Consulted and Agree with Plan of Care Patient        Problem List Patient Active Problem List   Diagnosis Date Noted  . Generalized anxiety disorder 04/25/2015  . History of depression 04/25/2015  . Raynaud's disease 04/25/2015  . GERD (gastroesophageal reflux disease) 04/25/2015  . Neuropathy (HCC) 04/25/2015  . Neck pain 04/25/2015  . Atrial fibrillation (HCC) 10/14/2014  . Acute CVA (cerebrovascular accident) (HCC)   . Rash and nonspecific skin eruption   . Cerebral embolism with cerebral infarction (HCC)   . 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 Tobacco abuse   . TIA (transient ischemic attack) 08/30/2014  . Hypercholesterolemia 08/30/2014    DildayDonavan Burnet, PT 08/05/2015, 9:40 PM  [facility] [address] [place], [place], [address] Phone: [phone]   Fax:   [phone]  Name: Lenzi Marmo MRN: [id number] Date of Birth: [birth year]

## 2015-08-06 ENCOUNTER — Encounter: Payer: Self-pay | Admitting: Medical

## 2015-08-06 ENCOUNTER — Ambulatory Visit (INDEPENDENT_AMBULATORY_CARE_PROVIDER_SITE_OTHER): Payer: Medicare Other | Admitting: Medical

## 2015-08-06 ENCOUNTER — Encounter (INDEPENDENT_AMBULATORY_CARE_PROVIDER_SITE_OTHER): Payer: Self-pay

## 2015-08-06 VITALS — BP 118/76 | HR 78 | Temp 98.0°F | Resp 16 | Ht 65.0 in | Wt 187.0 lb

## 2015-08-06 DIAGNOSIS — F32A Depression, unspecified: Secondary | ICD-10-CM

## 2015-08-06 DIAGNOSIS — R1032 Left lower quadrant pain: Secondary | ICD-10-CM | POA: Diagnosis not present

## 2015-08-06 DIAGNOSIS — I7300 Raynaud's syndrome without gangrene: Secondary | ICD-10-CM

## 2015-08-06 DIAGNOSIS — R21 Rash and other nonspecific skin eruption: Secondary | ICD-10-CM

## 2015-08-06 DIAGNOSIS — F329 Major depressive disorder, single episode, unspecified: Secondary | ICD-10-CM

## 2015-08-06 DIAGNOSIS — R195 Other fecal abnormalities: Secondary | ICD-10-CM

## 2015-08-06 DIAGNOSIS — I6340 Cerebral infarction due to embolism of unspecified cerebral artery: Secondary | ICD-10-CM

## 2015-08-06 DIAGNOSIS — K219 Gastro-esophageal reflux disease without esophagitis: Secondary | ICD-10-CM | POA: Diagnosis not present

## 2015-08-06 DIAGNOSIS — Z86010 Personal history of colonic polyps: Secondary | ICD-10-CM

## 2015-08-06 LAB — COMPREHENSIVE METABOLIC PANEL
ALBUMIN: 3.8 g/dL (ref 3.5–5.2)
ALT: 11 U/L (ref 0–35)
AST: 19 U/L (ref 0–37)
Alkaline Phosphatase: 65 U/L (ref 39–117)
BUN: 9 mg/dL (ref 6–23)
CHLORIDE: 105 meq/L (ref 96–112)
CO2: 27 meq/L (ref 19–32)
Calcium: 9 mg/dL (ref 8.4–10.5)
Creatinine, Ser: 0.83 mg/dL (ref 0.40–1.20)
GFR: 76.28 mL/min (ref >60.00)
GLUCOSE: 90 mg/dL (ref 70–99)
POTASSIUM: 5 meq/L (ref 3.5–5.1)
SODIUM: 139 meq/L (ref 135–145)
Total Bilirubin: 0.4 mg/dL (ref 0.2–1.2)
Total Protein: 6.5 g/dL (ref 6.0–8.3)

## 2015-08-06 LAB — CBC WITH DIFFERENTIAL/PLATELET
BASOS PCT: 0.4 % (ref 0.0–3.0)
Basophils Absolute: 0 10*3/uL (ref 0.0–0.1)
EOS PCT: 1.9 % (ref 0.0–5.0)
Eosinophils Absolute: 0.2 10*3/uL (ref 0.0–0.7)
HCT: 38.7 % (ref 36.0–46.0)
Hemoglobin: 12.9 g/dL (ref 12.0–15.0)
LYMPHS ABS: 1.5 10*3/uL (ref 0.7–4.0)
Lymphocytes Relative: 15.9 % (ref 12.0–46.0)
MCHC: 33.4 g/dL (ref 30.0–36.0)
MCV: 92.2 fl (ref 78.0–100.0)
Monocytes Absolute: 0.6 10*3/uL (ref 0.1–1.0)
Monocytes Relative: 6.3 % (ref 3.0–12.0)
NEUTROS ABS: 7.3 10*3/uL (ref 1.4–7.7)
NEUTROS PCT: 75.5 % (ref 43.0–77.0)
PLATELETS: 172 10*3/uL (ref 150.0–400.0)
RBC: 4.19 Mil/uL (ref 3.87–5.11)
RDW: 13.8 % (ref 11.5–15.5)
WBC: 9.6 10*3/uL (ref 4.0–10.5)

## 2015-08-06 MED ORDER — SERTRALINE HCL 50 MG PO TABS: 50.0000 mg | ORAL_TABLET | Freq: Every day | ORAL | Status: DC

## 2015-08-06 MED ORDER — CLOTRIMAZOLE-BETAMETHASONE 1-0.05 % EX CREA: 1.0000 "application " | TOPICAL_CREAM | Freq: Two times a day (BID) | CUTANEOUS | Status: DC

## 2015-08-06 MED ORDER — CIPROFLOXACIN HCL 500 MG PO TABS: 500.0000 mg | ORAL_TABLET | Freq: Two times a day (BID) | ORAL | Status: DC

## 2015-08-06 MED ORDER — METRONIDAZOLE 500 MG PO TABS: 500.0000 mg | ORAL_TABLET | Freq: Three times a day (TID) | ORAL | Status: DC

## 2015-08-06 NOTE — Progress Notes (Signed)
Pre visit review using our clinic review tool, if applicable. No additional management support is needed unless otherwise documented below in the visit note. 

## 2015-08-06 NOTE — Patient Instructions (Addendum)
For gerd. Continue on omeprazole.  For your recent loose stools, llq pain, and hx of colon cancer(family members with polyps), I will rx cipro and flagyl. Get labs and refer you to GI.  For your depression. I will increase your sertraline to 50 mg a day.  For your raynauds. I want to see how you do on new ca channel blocker first and see what your bp readings are before considering rx of doxazosin(which is not first line tx choice).  For your rash rx of lotrisone. If rash spread notify me. If persists will refer to dermatologist.  Follow up in 3 wks or as needed.

## 2015-08-06 NOTE — Progress Notes (Signed)
Subjective:    Patient ID: Dawn Escobar, female    DOB: December 18, 1960, 54 y.o.   MRN: [id number]  HPI   Pt in for follow up.   Pt states her stomach does feel better with omeprazole. She is on 20 mg tablet. Never had been on before. Pt had partial improvement for about 1 hour after Gi cocktail. Pt she does report some occasional left flank pain/llq that comes and goes with loose stools. Last time had pain was yesterday. But no pain today. Pt has some occasional diarrhea on and off for one week. Pt states last colonoscopy in [place]. Pt was getting studies done every year. He had CA late 54 yo or 51 yr. Pt dad also had a lot of polyps and sisters.  Pt states that her symptoms having some loose stools since last visit.  No current cardiac symptoms. She did see cardiologist and had meds evaluated.  Pt has been on 25 mg of sertraline. She feels like this is not helping her mood recently. Mild depressed feeling.   At very end she showed me mild rash behind her left ear present for one week. It is itching.    Review of Systems  Constitutional: Negative for fever, chills, diaphoresis, activity change and fatigue.  Respiratory: Negative for cough, chest tightness and shortness of breath.   Cardiovascular: Negative for chest pain, palpitations and leg swelling.  Gastrointestinal: Positive for abdominal pain and diarrhea. Negative for nausea and vomiting.  Musculoskeletal: Negative for neck pain and neck stiffness.  Skin: Positive for rash.  Neurological: Negative for dizziness, tremors, seizures, syncope, facial asymmetry, speech difficulty, weakness, light-headedness, numbness and headaches.  Psychiatric/Behavioral: Positive for dysphoric mood. Negative for behavioral problems, confusion and agitation. The patient is not nervous/anxious.       Past Medical History  Diagnosis Date  . PTSD (post-traumatic stress disorder)   . Raynaud's disease   . Anxiety   . Hypercholesteremia    . Stroke (HCC)   . GERD (gastroesophageal reflux disease) 04/25/2015  . Hereditary and idiopathic peripheral neuropathy 04/25/2015  . Hypertension   . Atrial fibrillation (HCC)   . Anxiety   . Migraine     Social History   Social History  . Marital Status: Single    Spouse Name: N/A  . Number of Children: 0  . Years of Education: 12th   Occupational History  . n/a    Social History Main Topics  . Smoking status: Former Smoker -- 0.20 packs/day    Types: Cigarettes  . Smokeless tobacco: Never Used  . Alcohol Use: 0.0 oz/week    0 Standard drinks or equivalent per week  . Drug Use: No  . Sexual Activity: Not on file   Other Topics Concern  . Not on file   Social History Narrative   Patient lives at home alone.   Caffeine use: 1 cup of coffee and 20 oz of soda    Past Surgical History  Procedure Laterality Date  . Wrist surgery    . Abdominal hysterectomy    . Knee surgery    . Hip surgery  Right  . Tee without cardioversion N/A 09/05/2014    Procedure: TRANSESOPHAGEAL ECHOCARDIOGRAM (TEE);  Surgeon: Wendall StadePeter C Nishan, MD;  [facility];  Service: Cardiovascular;  Laterality: N/A;  loop after  . Loop recorder implant N/A 09/05/2014    MDT LINQ implanted by Dr Johney FrameAllred for cryptogenic stroke    Family History  Problem Relation Age of Onset  .  Colon cancer Brother   . Heart disease Mother   . Heart attack Father     Allergies  Allergen Reactions  . Morphine And Related Hives  . Tape Rash    No tape at all    Current Outpatient Prescriptions on File Prior to Visit  Medication Sig Dispense Refill  . acetaminophen (TYLENOL) 500 MG tablet Take 500 mg by mouth every 6 (six) hours as needed for mild pain, moderate pain or headache.    Marland Kitchen amitriptyline (ELAVIL) 25 MG tablet Take 1 tablet (25 mg total) by mouth daily. (Patient taking differently: Take 25 mg by mouth at bedtime. ) 90 tablet 3  . AMLODIPINE & DIET MANAGE PROD PO Take by mouth.    Marland Kitchen apixaban  (ELIQUIS) 5 MG TABS tablet Take 1 tablet (5 mg total) by mouth 2 (two) times daily. 60 tablet 11  . clonazePAM (KLONOPIN) 0.5 MG tablet Take 1 tablet (0.5 mg total) by mouth 2 (two) times daily as needed (anxiety). 60 tablet 0  . doxycycline (VIBRA-TABS) 100 MG tablet Take 1 tablet (100 mg total) by mouth 2 (two) times daily. 20 tablet 0  . esomeprazole (NEXIUM) 40 MG capsule Take 1 capsule (40 mg total) by mouth daily. 30 capsule 3  . fluticasone (FLONASE) 50 MCG/ACT nasal spray Place 2 sprays into both nostrils daily. 16 g 1  . gabapentin (NEURONTIN) 100 MG capsule TAKE 1 CAPSULE (100 MG TOTAL) BY MOUTH 3 (THREE) TIMES DAILY. 90 capsule 1  . methocarbamol (ROBAXIN) 500 MG tablet Take 500 mg by mouth at bedtime as needed for muscle spasms (take one daily by mouth at bedtime if needed).    Marland Kitchen omeprazole (PRILOSEC) 20 MG capsule Take 1 capsule (20 mg total) by mouth daily. 30 capsule 1  . rosuvastatin (CRESTOR) 40 MG tablet Take 1 tablet (40 mg total) by mouth daily. 30 tablet 5  . sertraline (ZOLOFT) 25 MG tablet TAKE 1 TABLET (25 MG TOTAL) BY MOUTH DAILY. 30 tablet 2  . traZODone (DESYREL) 100 MG tablet TAKE 1 TABLET (100 MG TOTAL) BY MOUTH AT BEDTIME. 30 tablet 1  . verapamil (CALAN-SR) 120 MG CR tablet Take 1 tablet (120 mg total) by mouth daily. 30 tablet 5   No current facility-administered medications on file prior to visit.    BP 118/76 mmHg  Pulse 78  Temp(Src) 98 F (36.7 C) (Oral)  Resp 16  Ht  (1.651 m)  Wt 187 lb (84.823 kg)  BMI 31.12 kg/m2  SpO2 98%       Objective:   Physical Exam  General Mental Status- Alert. General Appearance- Not in acute distress.   Skin Mild linear rash about 2.5 cm behind lt ear. Mild raised.   Neck Carotid Arteries- Normal color. Moisture- Normal Moisture. No carotid bruits. No JVD.  Chest and Lung Exam Auscultation: Breath Sounds:-Normal.  Cardiovascular Auscultation:Rythm- Regular. Murmurs & Other Heart Sounds:Auscultation  of the heart reveals- No Murmurs.  Abdomen Inspection:-Inspeection Normal. Palpation/Percussion:Note:No mass. Palpation and Percussion of the abdomen reveal-  Tender llq and left flank, Non Distended + BS, no rebound or guarding.  Back- no cva tenderness    Neurologic Cranial Nerve exam:- CN III-XII intact(No nystagmus), symmetric smile. Strength:- 5/5 equal and symmetric strength both upper and lower extremities.        Assessment & Plan:  For gerd. Continue on omeprazole.  For your recent loose stools, llq pain, and hx of colon cancer(family members with polyps), I will rx cipro and  flagyl. Get labs and refer you to GI.  For your depression. I will increase your sertraline to 50 mg a day.  For your raynauds. I want to see how you do on new ca channel blocker first and see what your bp readings are before considering rx of doxazosin(which is not first line tx choice).  For your rash rx of lotrisone. If rash spread notify me. If persists will refer to dermatologist.  Follow up in 3 wks or as needed.

## 2015-08-07 ENCOUNTER — Ambulatory Visit: Payer: Medicare Other | Admitting: Physical Therapy

## 2015-08-07 ENCOUNTER — Ambulatory Visit: Payer: Medicare Other | Attending: Medical | Admitting: Speech Pathology

## 2015-08-07 DIAGNOSIS — I69398 Other sequelae of cerebral infarction: Secondary | ICD-10-CM | POA: Insufficient documentation

## 2015-08-07 DIAGNOSIS — R269 Unspecified abnormalities of gait and mobility: Secondary | ICD-10-CM

## 2015-08-07 DIAGNOSIS — I69351 Hemiplegia and hemiparesis following cerebral infarction affecting right dominant side: Secondary | ICD-10-CM | POA: Diagnosis not present

## 2015-08-07 DIAGNOSIS — R29898 Other symptoms and signs involving the musculoskeletal system: Secondary | ICD-10-CM | POA: Insufficient documentation

## 2015-08-07 DIAGNOSIS — R279 Unspecified lack of coordination: Secondary | ICD-10-CM | POA: Insufficient documentation

## 2015-08-07 DIAGNOSIS — M79602 Pain in left arm: Secondary | ICD-10-CM | POA: Diagnosis not present

## 2015-08-07 DIAGNOSIS — R4701 Aphasia: Secondary | ICD-10-CM | POA: Insufficient documentation

## 2015-08-07 DIAGNOSIS — M5441 Lumbago with sciatica, right side: Secondary | ICD-10-CM | POA: Diagnosis not present

## 2015-08-07 DIAGNOSIS — R2681 Unsteadiness on feet: Secondary | ICD-10-CM | POA: Diagnosis not present

## 2015-08-07 NOTE — Therapy (Signed)
[facility] [address] [place], [place], [address] Phone: [phone]   Fax:  [phone]  Speech Language Pathology Treatment  Patient Details  Name: Dawn Escobar MRN: [id number] Date of Birth: [birth year] No Data Recorded  Encounter Date: 08/07/2015      End of Session - 08/07/15 1359    Date for SLP Re-Evaluation 08/27/15   SLP Start Time 1322   SLP Stop Time  1400   SLP Time Calculation (min) 38 min   Activity Tolerance Patient limited by pain      Past Medical History  Diagnosis Date  . PTSD (post-traumatic stress disorder)   . Raynaud's disease   . Anxiety   . Hypercholesteremia   . Stroke (HCC)   . GERD (gastroesophageal reflux disease) 04/25/2015  . Hereditary and idiopathic peripheral neuropathy 04/25/2015  . Hypertension   . Atrial fibrillation (HCC)   . Anxiety   . Migraine     Past Surgical History  Procedure Laterality Date  . Wrist surgery    . Abdominal hysterectomy    . Knee surgery    . Hip surgery  Right  . Tee without cardioversion N/A 09/05/2014    Procedure: TRANSESOPHAGEAL ECHOCARDIOGRAM (TEE);  Surgeon: Wendall Stade, MD;  Location: [facility] ENDOSCOPY;  Service: Cardiovascular;  Laterality: N/A;  loop after  . 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  Visit Diagnosis: Expressive aphasia      Subjective Assessment - 08/07/15 1323    Subjective " forgot the homework - it was very hard" pt arrived 8 minutes late               ADULT SLP TREATMENT - 08/07/15 1323    General Information   Behavior/Cognition Alert;Cooperative;Pleasant mood   Treatment Provided   Treatment provided Cognitive-Linquistic   Pain Assessment   Pain Assessment 0-10   Pain Score 9    Pain Location left neck, shoulder, arm and lower back   Pain Descriptors / Indicators Aching;Constant   Pain  Intervention(s) Monitored during session   Cognitive-Linquistic Treatment   Treatment focused on Aphasia   Skilled Treatment Facilitated naming in categories - pt named 4-5 items in a given category with min to mod sematnic, phonemic and written cues.  Facilitated written express and reading comprehension at the word level with identifiying errors in sentences with 80% accuracy and min A. Pt required max A  to correct errors at word level.   Assessment / Recommendations / Plan   Plan Continue with current plan of care   Progression Toward Goals   Progression toward goals Progressing toward goals            SLP Short Term Goals - 08/07/15 1359    SLP SHORT TERM GOAL #1   Title Pt will name 8 items for a simple category with occasional min A   Time 3   Period Weeks   Status On-going   SLP SHORT TERM GOAL #2   Title Pt will comprehend simple functional written material (menus, schedules, directions) with 75% accuracy and occasional minb A   Status Achieved   SLP SHORT TERM GOAL #3   Title Pt will write simple sentences and functional information (forms, orders, lists) with 80% accuracy and occasional min A.    Status Achieved   SLP SHORT TERM GOAL #4   Title Pt  will utlize compensations for dysarthira during structured tasks with occasional min A   Status Achieved          SLP Long Term Goals - 08/07/15 1359    SLP LONG TERM GOAL #1   Title Pt will participate in 8 minute simple conversation with compensations for dysarthria and aphasia with occasional min A   Status Achieved   SLP LONG TERM GOAL #2   Title Pt will comprhend simple 3-5 sentence paragraph with 80% accuracy and rare min A   Time 7   Period Weeks   Status On-going   SLP LONG TERM GOAL #3   Title pt will demo written language appropriate for functional tasks 95% success   Time 7   Period Weeks   Status On-going          Plan - 08/07/15 1356    Clinical Impression Statement Pt. continues to present with  mild aphasia, with word finding difficulties in conversation and required max A to correct written errors/absurdities. Continue skilled ST to maximize verbal and written expression.    Speech Therapy Frequency 2x / week   Treatment/Interventions Compensatory techniques;Patient/family education;Functional tasks;Cueing hierarchy;Multimodal communcation approach;SLP instruction and feedback;Internal/external aids   Potential to Achieve Goals Good        Problem List Patient Active Problem List   Diagnosis Date Noted  . Generalized anxiety disorder 04/25/2015  . History of depression 04/25/2015  . Raynaud's disease 04/25/2015  . GERD (gastroesophageal reflux disease) 04/25/2015  . Neuropathy (HCC) 04/25/2015  . Neck pain 04/25/2015  . Atrial fibrillation (HCC) 10/14/2014  . Acute CVA (cerebrovascular accident) (HCC)   . Rash and nonspecific skin eruption   . Cerebral embolism with cerebral infarction (HCC)   . 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 Tobacco abuse   . TIA (transient ischemic attack) 08/30/2014  . Hypercholesterolemia 08/30/2014    Lovvorn, Radene JourneyLaura Ann MS, CCC-SLP 08/07/2015, 2:00 PM  [facility] [address] [place], [place], [address] Phone: [phone]   Fax:  [phone]   Name: Dawn Escobar MRN: [id number] Date of Birth: 1961/03/15

## 2015-08-07 NOTE — Therapy (Signed)
[facility] [address] [place], [place], [address] Phone: [phone]   Fax:  [phone]  Physical Therapy Treatment  Patient Details  Name: Dawn Escobar MRN: [id number] Date of Birth: 10-21-1960 No Data Recorded  Encounter Date: 08/07/2015      PT End of Session - 08/07/15 1501    Visit Number 3   Number of Visits 17   Date for PT Re-Evaluation 09/29/15   Authorization Type MCR-G codes every 10th visit   PT Start Time 1404   PT Stop Time 1448   PT Time Calculation (min) 44 min   Equipment Utilized During Treatment Gait belt   Activity Tolerance Patient tolerated treatment well   Behavior During Therapy [facility] for tasks assessed/performed      Past Medical History  Diagnosis Date  . PTSD (post-traumatic stress disorder)   . Raynaud's disease   . Anxiety   . Hypercholesteremia   . Stroke (HCC)   . GERD (gastroesophageal reflux disease) 04/25/2015  . Hereditary and idiopathic peripheral neuropathy 04/25/2015  . Hypertension   . Atrial fibrillation (HCC)   . Anxiety   . Migraine     Past Surgical History  Procedure Laterality Date  . Wrist surgery    . Abdominal hysterectomy    . Knee surgery    . Hip surgery  Right  . Tee without cardioversion N/A 09/05/2014    Procedure: TRANSESOPHAGEAL ECHOCARDIOGRAM (TEE);  Surgeon: Wendall Stade, MD;  Location: [facility] ENDOSCOPY;  Service: Cardiovascular;  Laterality: N/A;  loop after  . 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  Visit Diagnosis:  Abnormality of gait      Subjective Assessment - 08/07/15 1411    Subjective Having L UE pain today that started yesterday.     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 Yes   Pain Score 9    Pain Location Shoulder   Pain Orientation Left   Pain Descriptors / Indicators Aching;Throbbing   Pain Type Acute pain   Pain Onset Yesterday   Pain Frequency Constant   Aggravating Factors  unsure-just started   Pain Relieving Factors nothing really-tried ice and heat     Performed runners stretch x 30 seconds bil sides at chair for support Supine bil knee to chest x 30 seconds x 2 Supine R LE-heel slides, SAQ, hip abdcution/adduction all x 10 with 3# weight with significant spasticity/tone during movements. Gentle massage to L upper trap and paraspinals Standing step ups onto/off 6" step with UE assist with LLE for RLE weight bearing.  Taps to 6" step with alternating LE's and UE assist for LE weight bearing.         PT Short Term Goals - 07/31/15 1837    PT SHORT TERM GOAL #1   Title Pt will initiate HEP for strengthening and balance to decrease fall risk and improve functional mobility.  (Target Date: 08/28/15)   Time 4   Period Weeks   PT SHORT TERM GOAL #2   Title Pt will increase DGI to 22/24 in order to indicate increase in functional balance and decreased fall risk.    (Target Date: 08/28/15)   Time 4   Period Weeks  PT SHORT TERM GOAL #3   Title Pt will verbalize CVA warning signs independently to indicate decreased time to seek medical attention in case of future CVA.   (Target Date: 08/28/15)   Time 4   Period Weeks   PT SHORT TERM GOAL #4   Title Pt will report no more than 2 point increase in back pain during therapy session to indicate that pain is decreased limiting factor in mobility.   (Target Date: 08/28/15)   Time 4   Period Weeks   PT SHORT TERM GOAL #5   Title Will assess 6MWT and increase by 150' in order to indicate increase in fucntional endurance.    (Target Date: 08/28/15)           PT Long Term Goals - 07/31/15 1843    PT LONG TERM GOAL #1   Title Pt will be independent with HEP for strengthening  and balance in order to indicate decreased fall risk and improved functional mobility. (Target Date: 09/25/15)   Time 8   Period Weeks   PT LONG TERM GOAL #2   Title Pt will increase R hip strength to 4/5 to indicate increased strength and improved functional mobility.   (Target Date: 09/25/15)   Time 8   Period Weeks   PT LONG TERM GOAL #3   Title Pt will initiate participation in community fitness program to continue to improve functional mobility beyond therapy.   (Target Date: 09/25/15)   Time 8   Period Weeks   PT LONG TERM GOAL #4   Title Pt will ambulate 500' without AD over outdoor surfaces (paved, grass, curb, etc) at mod I level to indicate safe return to community.   (Target Date: 09/25/15)   Time 8   PT LONG TERM GOAL #5   Title Pt will increase 6MWT by 150' to indicate increase in functional endurance.   (Target Date: 09/25/15)               Plan - 08/07/15 1501    Clinical Impression Statement Pt continues with increased tone/spasticity in RLE with tremors with certain exercises but not present during transfers or ambulation .  Continue PT per POC.   Pt will benefit from skilled therapeutic intervention in order to improve on the following deficits Abnormal gait;Decreased activity tolerance;Decreased balance;Decreased cognition;Decreased coordination;Decreased endurance;Decreased mobility;Decreased strength;Difficulty walking;Impaired perceived functional ability;Impaired sensation;Impaired UE functional use;Postural dysfunction;Improper body mechanics;Dizziness   Rehab Potential Good   PT Frequency 2x / week   PT Duration 8 weeks   PT Treatment/Interventions ADLs/Self Care Home Management;Electrical Stimulation;Moist Heat;Ultrasound;Gait training;Stair training;Functional mobility training;Therapeutic activities;Therapeutic exercise;Neuromuscular re-education;Balance training;Patient/family education;Manual techniques;Vestibular   PT Next Visit Plan RLE strengthening  in closed chain;CVA education   PT Home Exercise Plan back packet exercises and stretches - heel cord and hamstring        Problem List Patient Active Problem List   Diagnosis Date Noted  . Generalized anxiety disorder 04/25/2015  . History of depression 04/25/2015  . Raynaud's disease 04/25/2015  . GERD (gastroesophageal reflux disease) 04/25/2015  . Neuropathy (HCC) 04/25/2015  . Neck pain 04/25/2015  . Atrial fibrillation (HCC) 10/14/2014  . Acute CVA (cerebrovascular accident) (HCC)   . Rash and nonspecific skin eruption   . Cerebral embolism with cerebral infarction (HCC)   . 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 Tobacco abuse   . TIA (transient ischemic attack) 08/30/2014  . Hypercholesterolemia 08/30/2014    Newell Coralobertson, Laylamarie Terry  08/07/2015, 3:04 PM  [facility] [address] [place], [place], [address] Phone: [phone]   Fax:  [phone]  Name: Dawn Escobar MRN: [id number] Date of Birth: Aug 17, 1961    Newell Coral, [facility] 08/07/2015 3:04 PM Phone: [phone] Fax: [phone]

## 2015-08-09 ENCOUNTER — Ambulatory Visit: Payer: Medicare Other | Admitting: Speech Pathology

## 2015-08-09 ENCOUNTER — Ambulatory Visit: Payer: Medicare Other | Admitting: Physical Therapy

## 2015-08-09 DIAGNOSIS — R269 Unspecified abnormalities of gait and mobility: Secondary | ICD-10-CM

## 2015-08-09 DIAGNOSIS — R4701 Aphasia: Secondary | ICD-10-CM

## 2015-08-09 DIAGNOSIS — I69351 Hemiplegia and hemiparesis following cerebral infarction affecting right dominant side: Secondary | ICD-10-CM | POA: Diagnosis not present

## 2015-08-09 DIAGNOSIS — R279 Unspecified lack of coordination: Secondary | ICD-10-CM | POA: Diagnosis not present

## 2015-08-09 DIAGNOSIS — R2681 Unsteadiness on feet: Secondary | ICD-10-CM | POA: Diagnosis not present

## 2015-08-09 DIAGNOSIS — R29898 Other symptoms and signs involving the musculoskeletal system: Secondary | ICD-10-CM | POA: Diagnosis not present

## 2015-08-09 NOTE — Therapy (Signed)
[facility] [address] [place], [place], [address] Phone: [phone]   Fax:  [phone]  Speech Language Pathology Treatment  Patient Details  Name: Donielle Kaigler MRN: [id number] Date of Birth: 09/01/1961 No Data Recorded  Encounter Date: 08/09/2015      End of Session - 08/09/15 1401    Visit Number 4   Number of Visits 17   Date for SLP Re-Evaluation 08/27/15   SLP Start Time 1317   SLP Stop Time  1400   SLP Time Calculation (min) 43 min      Past Medical History  Diagnosis Date  . PTSD (post-traumatic stress disorder)   . Raynaud's disease   . Anxiety   . Hypercholesteremia   . Stroke (HCC)   . GERD (gastroesophageal reflux disease) 04/25/2015  . Hereditary and idiopathic peripheral neuropathy 04/25/2015  . Hypertension   . Atrial fibrillation (HCC)   . Anxiety   . Migraine     Past Surgical History  Procedure Laterality Date  . Wrist surgery    . Abdominal hysterectomy    . Knee surgery    . Hip surgery  Right  . Tee without cardioversion N/A 09/05/2014    Procedure: TRANSESOPHAGEAL ECHOCARDIOGRAM (TEE);  Surgeon: Wendall Stade, MD;  Location: [facility] ENDOSCOPY;  Service: Cardiovascular;  Laterality: N/A;  loop after  . 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  Visit Diagnosis: Expressive aphasia      Subjective Assessment - 08/09/15 1323    Subjective "The homework was hard"               ADULT SLP TREATMENT - 08/09/15 1323    Pain Assessment   Pain Assessment No/denies pain   Pain Score 9    Pain Location left arm and shoulder   Pain Descriptors / Indicators Tingling;Aching   Pain Intervention(s) Monitored during session   Cognitive-Linquistic Treatment   Treatment focused on Aphasia   Skilled Treatment Facilitated verbal expression/naming with pt generating 2 sentences with  multiple meaning words with extended time and rare min A. Pt named 6 tems for given category with usual min to mod questioning cues. Reading comprehension at sentence level correcting verbal absudities - oral reading with improved speed and fluency. Pt demonstrated 85% accuracy in correcing absudity with min A and extended time, indicating comprehension of mildly compex sentences.    Assessment / Recommendations / Plan   Plan Continue with current plan of care   Progression Toward Goals   Progression toward goals Progressing toward goals          SLP Education - 08/09/15 1353    Education provided Yes   Education Details compensations for aphasia   Person(s) Educated Patient   Methods Explanation;Demonstration;Handout   Comprehension Verbalized understanding;Returned demonstration          SLP Short Term Goals - 08/09/15 1401    SLP SHORT TERM GOAL #1   Title Pt will name 8 items for a simple category with occasional min A   Time 3   Period Weeks   Status On-going   SLP SHORT TERM GOAL #2   Title Pt will comprehend simple functional written material (menus, schedules, directions) with 75% accuracy and occasional minb A   Status Achieved   SLP SHORT TERM GOAL #3   Title Pt will write simple sentences and functional  information (forms, orders, lists) with 80% accuracy and occasional min A.    Status Achieved   SLP SHORT TERM GOAL #4   Title Pt will utlize compensations for dysarthira during structured tasks with occasional min A   Status Achieved          SLP Long Term Goals - 08/09/15 1401    SLP LONG TERM GOAL #1   Title Pt will participate in 8 minute simple conversation with compensations for dysarthria and aphasia with occasional min A   Status Achieved   SLP LONG TERM GOAL #2   Title Pt will comprhend simple 3-5 sentence paragraph with 80% accuracy and rare min A   Time 7   Period Weeks   Status On-going   SLP LONG TERM GOAL #3   Title pt will demo written  language appropriate for functional tasks 95% success   Time 7   Period Weeks   Status On-going          Plan - 08/09/15 1356    Clinical Impression Statement Mild aphasia continues duirng structured naming tasks, and reading tasks. Conversational speech is fluent and grammatic, which is inconsistent  and atypical when compared with with performance on naming tasks. Writing last session slower, more labored than on eval, which is also inconsistent. Continue skilled ST to maximize verbal expression and reading compenension.   Speech Therapy Frequency 2x / week   Treatment/Interventions Compensatory techniques;Patient/family education;Functional tasks;Cueing hierarchy;Multimodal communcation approach;SLP instruction and feedback;Internal/external aids   Potential to Achieve Goals Good        Problem List Patient Active Problem List   Diagnosis Date Noted  . Generalized anxiety disorder 04/25/2015  . History of depression 04/25/2015  . Raynaud's disease 04/25/2015  . GERD (gastroesophageal reflux disease) 04/25/2015  . Neuropathy (HCC) 04/25/2015  . Neck pain 04/25/2015  . Atrial fibrillation (HCC) 10/14/2014  . Acute CVA (cerebrovascular accident) (HCC)   . Rash and nonspecific skin eruption   . Cerebral embolism with cerebral infarction (HCC)   . 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 Tobacco abuse   . TIA (transient ischemic attack) 08/30/2014  . Hypercholesterolemia 08/30/2014    [place] Hibberd, Radene JourneyLaura Ann MS, CCC-SLP 08/09/2015, 2:02 PM  [facility] [address] [place], [place], [address] Phone: [phone]   Fax:  [phone]   Name: Philipp OvensDenise Roycroft MRN: [id number] Date of Birth: 1961-02-14

## 2015-08-09 NOTE — Patient Instructions (Signed)
Homework provided 

## 2015-08-09 NOTE — Therapy (Signed)
[facility] [address] [place], [place], [address] Phone: [phone]   Fax:  [phone]  Physical Therapy Treatment  Patient Details  Name: Dawn [name] MRN: [id number] Date of Birth: [birth year] No Data Recorded  Encounter Date: 08/09/2015      PT End of Session - 08/09/15 1950    Visit Number 4   Number of Visits 17   Date for PT Re-Evaluation 09/29/15   Authorization Type MCR-G codes every 10th visit   PT Start Time 1405   PT Stop Time 1444   PT Time Calculation (min) 39 min   Activity Tolerance Patient limited by pain   Behavior During Therapy [facility] for tasks assessed/performed      Past Medical History  Diagnosis Date  . PTSD (post-traumatic stress disorder)   . Raynaud's disease   . Anxiety   . Hypercholesteremia   . Stroke (HCC)   . GERD (gastroesophageal reflux disease) 04/25/2015  . Hereditary and idiopathic peripheral neuropathy 04/25/2015  . Hypertension   . Atrial fibrillation (HCC)   . Anxiety   . Migraine     Past Surgical History  Procedure Laterality Date  . Wrist surgery    . Abdominal hysterectomy    . Knee surgery    . Hip surgery  Right  . Tee without cardioversion N/A 09/05/2014    Procedure: TRANSESOPHAGEAL ECHOCARDIOGRAM (TEE);  Surgeon: Wendall StadePeter C Nishan, MD;  [facility];  Service: Cardiovascular;  Laterality: N/A;  loop after  . Loop recorder implant N/A 09/05/2014    MDT LINQ implanted by Dr Johney FrameAllred for cryptogenic stroke    There were no vitals filed for this visit.  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 Yes   Pain Score 8    Pain Location  Shoulder   Pain Orientation Left   Pain Descriptors / Indicators Aching;Throbbing   Pain Type Acute pain   Pain Onset In the past 7 days   Pain Frequency Constant   Aggravating Factors  hanging down   Pain Relieving Factors nothing really other than holding it to her body      Scifit all 4 extremities x 13 minutes for UE flexibility and LE strengthening.  No uncontrolled RLE movements noted.  Taps to 6" step and stepping onto/off 6" step-with UE assist.  Pt having difficulty placing RLE on step with taps but not with stepping onto/off step.  Seated hip flexion, hamstring curl, LAQ and ankle dorsiflexion x 10 each with yellow theraband.  Uncontrolled movements not present with hip flexion but were visible with other exercises.  Needs rest breaks in between activities.                             PT Short Term Goals - 07/31/15 1837    PT SHORT TERM GOAL #1   Title Pt will initiate HEP for strengthening and balance to decrease fall risk and improve functional mobility.  (Target Date: 08/28/15)   Time 4   Period Weeks   PT SHORT TERM GOAL #2   Title Pt will increase DGI to 22/24 in  order to indicate increase in functional balance and decreased fall risk.    (Target Date: 08/28/15)   Time 4   Period Weeks   PT SHORT TERM GOAL #3   Title Pt will verbalize CVA warning signs independently to indicate decreased time to seek medical attention in case of future CVA.   (Target Date: 08/28/15)   Time 4   Period Weeks   PT SHORT TERM GOAL #4   Title Pt will report no more than 2 point increase in back pain during therapy session to indicate that pain is decreased limiting factor in mobility.   (Target Date: 08/28/15)   Time 4   Period Weeks   PT SHORT TERM GOAL #5   Title Will assess and increase by 150' in order to indicate increase in fucntional endurance.    (Target Date: 08/28/15)           PT Long Term Goals - 07/31/15 1843    PT LONG TERM GOAL #1    Title Pt will be independent with HEP for strengthening and balance in order to indicate decreased fall risk and improved functional mobility. (Target Date: 09/25/15)   Time 8   Period Weeks   PT LONG TERM GOAL #2   Title Pt will increase R hip strength to 4/5 to indicate increased strength and improved functional mobility.   (Target Date: 09/25/15)   Time 8   Period Weeks   PT LONG TERM GOAL #3   Title Pt will initiate participation in community fitness program to continue to improve functional mobility beyond therapy.   (Target Date: 09/25/15)   Time 8   Period Weeks   PT LONG TERM GOAL #4   Title Pt will ambulate 500' without AD over outdoor surfaces (paved, grass, curb, etc) at mod I level to indicate safe return to community.   (Target Date: 09/25/15)   Time 8   PT LONG TERM GOAL #5   Title Pt will increase by 150' to indicate increase in functional endurance.   (Target Date: 09/25/15)               Plan - 08/09/15 1951    Clinical Impression Statement Pt with c/o L UE shoulder pain today.  States she had "pinched nerve" on R side previously.  Continues with inconsistent movements in RLE.  Continue PT per POC.   Pt will benefit from skilled therapeutic intervention in order to improve on the following deficits Abnormal gait;Decreased activity tolerance;Decreased balance;Decreased cognition;Decreased coordination;Decreased endurance;Decreased mobility;Decreased strength;Difficulty walking;Impaired perceived functional ability;Impaired sensation;Impaired UE functional use;Postural dysfunction;Improper body mechanics;Dizziness   Rehab Potential Good   PT Frequency 2x / week   PT Duration 8 weeks   PT Treatment/Interventions ADLs/Self Care Home Management;Electrical Stimulation;Moist Heat;Ultrasound;Gait training;Stair training;Functional mobility training;Therapeutic activities;Therapeutic exercise;Neuromuscular re-education;Balance training;Patient/family education;Manual  techniques;Vestibular   PT Next Visit Plan RLE strengthening in closed chain;CVA education   PT Home Exercise Plan back packet exercises and stretches - heel cord and hamstring   Consulted and Agree with Plan of Care Patient        Problem List Patient Active Problem List   Diagnosis Date Noted  . Generalized anxiety disorder 04/25/2015  . History of depression 04/25/2015  . Raynaud's disease 04/25/2015  . GERD (gastroesophageal reflux disease) 04/25/2015  . Neuropathy (HCC) 04/25/2015  . Neck pain 04/25/2015  . Atrial fibrillation (HCC) 10/14/2014  . Acute CVA (cerebrovascular accident) (HCC)   . Rash and nonspecific skin eruption   .  Cerebral embolism with cerebral infarction (HCC)   . 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 Tobacco abuse   . TIA (transient ischemic attack) 08/30/2014  . Hypercholesterolemia 08/30/2014    Newell Coral 08/09/2015, 7:58 PM  [facility] [address] [place], [place], [address] Phone: [phone]   Fax:  [phone]  Name: Dawn Escobar MRN: [id number] Date of Birth: Feb 07, 1961    Newell Coral, [place] [facility] 08/09/2015 7:58 PM Phone: [phone] Fax: [phone]

## 2015-08-11 ENCOUNTER — Other Ambulatory Visit: Payer: Self-pay | Admitting: Medical

## 2015-08-14 ENCOUNTER — Ambulatory Visit: Payer: Medicare Other | Admitting: Speech Pathology

## 2015-08-14 ENCOUNTER — Ambulatory Visit: Payer: Medicare Other | Admitting: Physical Therapy

## 2015-08-14 ENCOUNTER — Ambulatory Visit: Payer: Medicare Other | Admitting: Occupational Therapy

## 2015-08-14 VITALS — BP 128/69 | HR 68

## 2015-08-14 DIAGNOSIS — R279 Unspecified lack of coordination: Secondary | ICD-10-CM | POA: Diagnosis not present

## 2015-08-14 DIAGNOSIS — R4701 Aphasia: Secondary | ICD-10-CM | POA: Diagnosis not present

## 2015-08-14 DIAGNOSIS — I69351 Hemiplegia and hemiparesis following cerebral infarction affecting right dominant side: Secondary | ICD-10-CM

## 2015-08-14 DIAGNOSIS — R29898 Other symptoms and signs involving the musculoskeletal system: Secondary | ICD-10-CM

## 2015-08-14 DIAGNOSIS — R2681 Unsteadiness on feet: Secondary | ICD-10-CM | POA: Diagnosis not present

## 2015-08-14 DIAGNOSIS — I69398 Other sequelae of cerebral infarction: Secondary | ICD-10-CM

## 2015-08-14 DIAGNOSIS — M79602 Pain in left arm: Secondary | ICD-10-CM

## 2015-08-14 DIAGNOSIS — R278 Other lack of coordination: Secondary | ICD-10-CM

## 2015-08-14 DIAGNOSIS — R269 Unspecified abnormalities of gait and mobility: Secondary | ICD-10-CM | POA: Diagnosis not present

## 2015-08-14 DIAGNOSIS — R209 Unspecified disturbances of skin sensation: Secondary | ICD-10-CM

## 2015-08-14 NOTE — Therapy (Signed)
[facility] [address] [place], [place], [address] Phone: [phone]   Fax:  [phone]  Physical Therapy Treatment  Patient Details  Name: Dawn Escobar MRN: [id number] Date of Birth: 28-Aug-1961 No Data Recorded  Encounter Date: 08/14/2015      PT End of Session - 08/14/15 1554    Visit Number 5   Number of Visits 17   Date for PT Re-Evaluation 09/29/15   Authorization Type MCR-G codes every 10th visit   PT Start Time 1147   PT Stop Time 1231   PT Time Calculation (min) 44 min   Activity Tolerance Patient limited by pain   Behavior During Therapy Anxious      Past Medical History  Diagnosis Date  . PTSD (post-traumatic stress disorder)   . Raynaud's disease   . Anxiety   . Hypercholesteremia   . Stroke (HCC)   . GERD (gastroesophageal reflux disease) 04/25/2015  . Hereditary and idiopathic peripheral neuropathy 04/25/2015  . Hypertension   . Atrial fibrillation (HCC)   . Anxiety   . Migraine     Past Surgical History  Procedure Laterality Date  . Wrist surgery    . Abdominal hysterectomy    . Knee surgery    . Hip surgery  Right  . Tee without cardioversion N/A 09/05/2014    Procedure: TRANSESOPHAGEAL ECHOCARDIOGRAM (TEE);  Surgeon: Wendall Stade, MD;  Location: [facility] ENDOSCOPY;  Service: Cardiovascular;  Laterality: N/A;  loop after  . Loop recorder implant N/A 09/05/2014    MDT LINQ implanted by Dr Johney Frame for cryptogenic stroke    Filed Vitals:   08/14/15 1149  BP: 128/69  Pulse: 68    Visit Diagnosis:  Hemiparesis affecting right side as late effect of stroke (HCC)      Subjective Assessment - 08/14/15 1149    Subjective "It's even worse." re: L shoulder and arm.  OT instructed her to call Dr Marlis Edelson office   Pertinent History CVA with right hemiparesis 08/30/14, and again on 06/01/15, anxiety, PTSD, Raynauds   Diagnostic tests MRI 06/01/15: Early subacute acute left MCA territory  cortical infarct    Currently in Pain? Yes   Pain Score 10-Worst pain ever   Pain Location Shoulder   Pain Orientation Left   Pain Descriptors / Indicators Aching   Pain Type Acute pain   Pain Onset In the past 7 days   Pain Frequency Constant   Aggravating Factors  malpositioning   Pain Relieving Factors repositioning                         [facility] Adult PT Treatment/Exercise - 08/14/15 1204    Transfers   Transfers Sit to Stand;Stand to Sit   Sit to Stand 6: Modified independent (Device/Increase time)   Stand to Sit 6: Modified independent (Device/Increase time)   Ambulation/Gait   Ambulation/Gait Yes   Ambulation/Gait Assistance 5: Supervision   Ambulation/Gait Assistance Details no evidence of ataxia with gait or knee/hip instability   Ambulation Distance (Feet) 576 Feet  plus 120' x 4   Assistive device None   Gait Pattern Step-through pattern;Decreased stance time - right;Decreased weight shift to right   Ambulation Surface Level;Indoor   Gait Comments 3 minute walk test x 576';pt unable to go any further due to L arm pain and endurance   Knee/Hip Exercises: Standing   Heel Raises Both;15 reps   Hip Flexion Both;15 reps  4# weight  Hip Abduction Both;15 reps  4# weight   Forward Step Up 15 reps;Hand Hold: 2;Both;Step Height: 6"   Other Standing Knee Exercises Straight leg hip flexion bil x 15 with 4# weight;walking on heels and walking on toes   Other Standing Knee Exercises pt continues to exhibit "uncontrolled" movement in R LE with exercises but is not present during gait;increased time needed to attempt to complete full ROM with exercises                PT Education - 08/14/15 1556    Education provided Yes   Education Details CVA education   Person(s) Educated Patient   Methods Explanation   Comprehension Need further instruction          PT Short Term Goals - 07/31/15 1837    PT SHORT TERM GOAL #1   Title Pt will initiate HEP  for strengthening and balance to decrease fall risk and improve functional mobility.  (Target Date: 08/28/15)   Time 4   Period Weeks   PT SHORT TERM GOAL #2   Title Pt will increase DGI to 22/24 in order to indicate increase in functional balance and decreased fall risk.    (Target Date: 08/28/15)   Time 4   Period Weeks   PT SHORT TERM GOAL #3   Title Pt will verbalize CVA warning signs independently to indicate decreased time to seek medical attention in case of future CVA.   (Target Date: 08/28/15)   Time 4   Period Weeks   PT SHORT TERM GOAL #4   Title Pt will report no more than 2 point increase in back pain during therapy session to indicate that pain is decreased limiting factor in mobility.   (Target Date: 08/28/15)   Time 4   Period Weeks   PT SHORT TERM GOAL #5   Title Will assess 6MWT and increase by 150' in order to indicate increase in fucntional endurance.    (Target Date: 08/28/15)           PT Long Term Goals - 07/31/15 1843    PT LONG TERM GOAL #1   Title Pt will be independent with HEP for strengthening and balance in order to indicate decreased fall risk and improved functional mobility. (Target Date: 09/25/15)   Time 8   Period Weeks   PT LONG TERM GOAL #2   Title Pt will increase R hip strength to 4/5 to indicate increased strength and improved functional mobility.   (Target Date: 09/25/15)   Time 8   Period Weeks   PT LONG TERM GOAL #3   Title Pt will initiate participation in community fitness program to continue to improve functional mobility beyond therapy.   (Target Date: 09/25/15)   Time 8   Period Weeks   PT LONG TERM GOAL #4   Title Pt will ambulate 500' without AD over outdoor surfaces (paved, grass, curb, etc) at mod I level to indicate safe return to community.   (Target Date: 09/25/15)   Time 8   PT LONG TERM GOAL #5   Title Pt will increase 6MWT by 150' to indicate increase in functional endurance.   (Target Date: 09/25/15)                Plan - 08/14/15 1555    Clinical Impression Statement Pt continues with inconsistency in RLE control.  Continue PT per POC.   Pt will benefit from skilled therapeutic intervention in order to improve on the following  deficits Abnormal gait;Decreased activity tolerance;Decreased balance;Decreased cognition;Decreased coordination;Decreased endurance;Decreased mobility;Decreased strength;Difficulty walking;Impaired perceived functional ability;Impaired sensation;Impaired UE functional use;Postural dysfunction;Improper body mechanics;Dizziness   Rehab Potential Good   PT Frequency 2x / week   PT Duration 8 weeks   PT Treatment/Interventions ADLs/Self Care Home Management;Electrical Stimulation;Moist Heat;Ultrasound;Gait training;Stair training;Functional mobility training;Therapeutic activities;Therapeutic exercise;Neuromuscular re-education;Balance training;Patient/family education;Manual techniques;Vestibular   PT Next Visit Plan RLE strengthening in closed chain;review CVA education and provide handout   PT Home Exercise Plan back packet exercises and stretches - heel cord and hamstring   Consulted and Agree with Plan of Care Patient        Problem List Patient Active Problem List   Diagnosis Date Noted  . Generalized anxiety disorder 04/25/2015  . History of depression 04/25/2015  . Raynaud's disease 04/25/2015  . GERD (gastroesophageal reflux disease) 04/25/2015  . Neuropathy (HCC) 04/25/2015  . Neck pain 04/25/2015  . Atrial fibrillation (HCC) 10/14/2014  . Acute CVA (cerebrovascular accident) (HCC)   . Rash and nonspecific skin eruption   . Cerebral embolism with cerebral infarction (HCC)   . 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 Tobacco abuse   . TIA (transient ischemic attack) 08/30/2014  . Hypercholesterolemia 08/30/2014    Newell Coral 08/14/2015, 3:58 PM  [facility] [address] [place], [place], [address] Phone: [phone]   Fax:  [phone]  Name: Dawn Escobar MRN: [id number] Date of Birth: 11/12/1960    Newell Coral, [place] [facility] 08/14/2015 3:58 PM Phone: [phone] Fax: [phone]

## 2015-08-14 NOTE — Therapy (Signed)
[facility] [address] [place], [place], [address] Phone: [phone]   Fax:  [phone]  Occupational Therapy Evaluation  Patient Details  Name: Philipp OvensDenise Meaney MRN: [id number] Date of Birth: [birth year] Referring Provider: Dr. Pearlean BrownieSethi  Encounter Date: 08/14/2015      OT End of Session - 08/14/15 1508    Visit Number 1   Number of Visits 17   Date for OT Re-Evaluation 10/11/14   Authorization Type Medicare   Authorization - Visit Number 1   Authorization - Number of Visits 10   OT Start Time 1107   OT Stop Time 1145   OT Time Calculation (min) 38 min   Activity Tolerance Patient tolerated treatment well   Behavior During Therapy [facility] for tasks assessed/performed      Past Medical History  Diagnosis Date  . PTSD (post-traumatic stress disorder)   . Raynaud's disease   . Anxiety   . Hypercholesteremia   . Stroke (HCC)   . GERD (gastroesophageal reflux disease) 04/25/2015  . Hereditary and idiopathic peripheral neuropathy 04/25/2015  . Hypertension   . Atrial fibrillation (HCC)   . Anxiety   . Migraine     Past Surgical History  Procedure Laterality Date  . Wrist surgery    . Abdominal hysterectomy    . Knee surgery    . Hip surgery  Right  . Tee without cardioversion N/A 09/05/2014    Procedure: TRANSESOPHAGEAL ECHOCARDIOGRAM (TEE);  Surgeon: Wendall StadePeter C Nishan, MD;  [facility];  Service: Cardiovascular;  Laterality: N/A;  loop after  . Loop recorder implant N/A 09/05/2014    MDT LINQ implanted by Dr Johney FrameAllred for cryptogenic stroke    There were no vitals filed for this visit.  Visit Diagnosis:  Right arm weakness - Plan: Ot plan of care cert/re-cert  Left arm pain - Plan: Ot plan of care cert/re-cert  Decreased coordination - Plan: Ot plan of care cert/re-cert  Altered sensation due to recent stroke - Plan: Ot plan of care cert/re-cert      Subjective Assessment - 08/14/15 1113    Subjective  Pt s/p CVA Nov. 2015 then a second CVA 06/01/15   Pertinent History see Epic snapshot    Patient Stated Goals reduce shoulder pain    Currently in Pain? Yes   Pain Score 8    Pain Location Shoulder   Pain Orientation Left   Pain Descriptors / Indicators Aching   Pain Type Acute pain   Pain Onset In the past 7 days   Pain Frequency Constant   Aggravating Factors  malpositioning   Pain Relieving Factors reposistioning           [facility] OT Assessment - 08/14/15 0001    Assessment   Diagnosis CVA   Referring Provider Dr. Pearlean BrownieSethi   Onset Date 06/01/15   Assessment Pt s/p CVA on 06/01/15 presents with RUE weakness,and decreased coordiantion which impedes performence of ADLs/ IADLS.   Prior Therapy OT/ PT  with previous CVA   Precautions   Precautions Fall   Precaution Comments loop recorder- no estim   Home  Environment   Family/patient expects to be discharged to: Other (comment)  lives in hotel   Lives With Alone   Prior Function   Level of Independence Independent   Vocation On disability   Leisure Try to get out at least 1x/day   ADL   Eating/Feeding Independent   Grooming --  increased time   Upper Body Bathing  Modified independent   Lower Body Bathing Modified independent   Lower Body Bathing Patient Percentage --  has shouwer seat    Upper Body Dressing Increased time;Independent   Lower Body Dressing Increased time;Independent   Press photographer Modified independent   ADL comments Pt reports she fatigues very quickly   Mobility   Mobility Status --  modified independent   Written Expression   Dominant Hand Left   Handwriting 100% legible;Increased time   Vision Assessment   Vision Assessment Vision impaired  _ to be further tested in functional context   Cognition   Overall Cognitive Status Cognition to be further assessed in functional context PRN   Area of Impairment Memory;Attention   Observation/Other  Assessments   Focus on Therapeutic Outcomes (FOTO)  58% (42% limitation)   Sensation   Light Touch Impaired by gross assessment   Light Touch Impaired Details Impaired RUE;Impaired LUE   Proprioception Impaired by gross assessment  RUE   Additional Comments reports occasional numbess and tingling down left  arm in thumb, 2nd and 3rd digits, but is more intermittent   Coordination   Gross Motor Movements are Fluid and Coordinated No   Fine Motor Movements are Fluid and Coordinated No   9 Hole Peg Test Right;Left   Right 9 Hole Peg Test 55 secs   Left 9 Hole Peg Test 34.34. secs   Tremors present in RUE   Coordination impaired RUE   ROM / Strength   AROM / PROM / Strength AROM;Strength   AROM   Overall AROM  Within functional limits for tasks performed  decreased control in RUE   Strength   Overall Strength Deficits   Overall Strength Comments RUE strength 3/5 to 3+/5, LUE 4-4+/5   Right Hand Grip (lbs) 15lbs   Left Hand Grip (lbs) 40 lbs                           OT Short Term Goals - 08/14/15 1523    OT SHORT TERM GOAL #1   Title I with HEP.   Time 4   Period Weeks   Status New   OT SHORT TERM GOAL #2   Title Pt will verbalize understanding of positioning,and strategies to minimize LUE pain   Time 4   Period Weeks   Status New   OT SHORT TERM GOAL #3   Title Pt will improve RUE grip strength to 22 lbs for increased functional use   Time 4   Period Weeks   Status New   OT SHORT TERM GOAL #4   Title Pt will demonstrate improved fine motor coordination as evidenced by decreasing LUE 9 hole peg test score to 45 secs or less.   Time 4   Period Weeks   Status New           OT Long Term Goals - 08/14/15 1529    OT LONG TERM GOAL #1   Title Pt will report ability to use LUE as dominant hand for ADLS/ IADLS 75% of the time with pain less than or equal to 5/10.   Time 8   Period Weeks   Status New   OT LONG TERM GOAL #2   Title Pt will increase  LUE grip strength to 48lbs or greater for increased functional use.   Time 8   Period Weeks   Status New   OT LONG TERM GOAL #3  Title Pt will demonstrate improved fine motor coordination as evidenced by decreasing RUE 9 hole peg test score to 40 secs or less.    Time 8   Period Weeks   Status New   OT LONG TERM GOAL #4   Title Pt will demonstrate adequate RUE strength to retrieve 3 lbs from overhead shelf x3 reps without dropping.   Time 8   Status New   OT LONG TERM GOAL #5   Title Pt will demonstrate ability to perform basic home managment/ cooking task modified independently demonstrating good safety awareness.   Time 8   Period Weeks   Status New               Plan - 2015-09-11 1509    Clinical Impression Statement Pt with history of CVA in Nov 2015 and the a recent CVA 06/01/15. Pt presents with decreased strength and coordination in RUE and pain in LUE which impedes performance of ADLs/ IADLs.Pt can benefit from skilled occupational therapy to maximize independence with ADLs/ IADLs.   Pt will benefit from skilled therapeutic intervention in order to improve on the following deficits (Retired) Decreased strength;Decreased mobility;Decreased cognition;Pain;Impaired UE functional use;Decreased balance;Decreased knowledge of use of DME;Decreased knowledge of precautions;Decreased activity tolerance;Impaired sensation;Decreased safety awareness;Decreased endurance;Impaired flexibility;Decreased range of motion;Decreased coordination;Abnormal gait   Rehab Potential Good   OT Frequency 2x / week  plus eval   OT Duration 8 weeks   OT Treatment/Interventions Self-care/ADL training;Therapeutic exercise;Patient/family education;Balance training;Splinting;Manual Therapy;Neuromuscular education;Ultrasound;Therapeutic exercises;Therapeutic activities;DME and/or AE instruction;Parrafin;Fluidtherapy;Cognitive remediation/compensation;Passive range of motion;Moist Heat;Contrast Bath   Plan  inital hEP for RUE coordination and strength   Consulted and Agree with Plan of Care Patient          G-Codes - 2015/09/11 1545    Functional Assessment Tool Used Grip strength:  RUE 15 lbs LUE 40 lbs    9 hole peg test: RUE15 lbs  LUE 40 lbs   Functional Limitation Self care   Self Care Current Status (Z6109) At least 20 percent but less than 40 percent impaired, limited or restricted   Self Care Goal Status (U0454) At least 1 percent but less than 20 percent impaired, limited or restricted      Problem List Patient Active Problem List   Diagnosis Date Noted  . Generalized anxiety disorder 04/25/2015  . History of depression 04/25/2015  . Raynaud's disease 04/25/2015  . GERD (gastroesophageal reflux disease) 04/25/2015  . Neuropathy (HCC) 04/25/2015  . Neck pain 04/25/2015  . Atrial fibrillation (HCC) 10/14/2014  . Acute CVA (cerebrovascular accident) (HCC)   . Rash and nonspecific skin eruption   . Cerebral embolism with cerebral infarction (HCC)   . 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 Tobacco abuse   . TIA (transient ischemic attack) 08/30/2014  . Hypercholesterolemia 08/30/2014    Kagan Hietpas 11-Sep-2015, 4:47 PM Keene Breath, OTR/L [phone] Phone: [phone] 4:47 PM 09/11/2015 [facility] [address] [place], [place], [address] Phone: [phone]   Fax:  [phone]  Name: Haeven Nickle MRN: [id number] Date of Birth: 1961-06-18

## 2015-08-14 NOTE — Therapy (Signed)
[facility] [address] [place], [place], [address] Phone: [phone]   Fax:  [phone]  Speech Language Pathology Treatment  Patient Details  Name: Maxene Byington MRN: [id number] Date of Birth: 03/13/1961 No Data Recorded  Encounter Date: 08/14/2015    Past Medical History  Diagnosis Date  . PTSD (post-traumatic stress disorder)   . Raynaud's disease   . Anxiety   . Hypercholesteremia   . Stroke ([place])   . GERD (gastroesophageal reflux disease) 04/25/2015  . Hereditary and idiopathic peripheral neuropathy 04/25/2015  . Hypertension   . Atrial fibrillation ([place])   . Anxiety   . Migraine     Past Surgical History  Procedure Laterality Date  . Wrist surgery    . Abdominal hysterectomy    . Knee surgery    . Hip surgery  Right  . Tee without cardioversion N/A 09/05/2014    Procedure: TRANSESOPHAGEAL ECHOCARDIOGRAM (TEE);  Surgeon: Josue Hector, MD;  Location: [facility]OSCOPY;  Service: Cardiovascular;  Laterality: N/A;  loop after  . 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  Visit Diagnosis: Expressive aphasia      Subjective Assessment - 08/14/15 1322    Subjective "I didn't get the homework"               ADULT SLP TREATMENT - 08/14/15 1324    General Information   Behavior/Cognition Alert;Cooperative;Pleasant mood   Treatment Provided   Treatment provided Cognitive-Linquistic   Pain Assessment   Pain Assessment 0-10   Pain Score 10-Worst pain ever   Pain Location left arm   Pain Descriptors / Indicators Aching;Other (Comment);Tingling   Pain Intervention(s) Monitored during session   Cognitive-Linquistic Treatment   Treatment focused on Aphasia   Skilled Treatment Reading comprehension and word findng correcting verbal absurdities in sentences with  extednded time with reading  comprehension at complex sentence level 95% with extedned time.  Reading comprehension of 5-7 sentence paragraphs 95% with mildly extended time and supervision cues.  Simple conversation over 8 minutes without word finding episodes.           SLP Education - 08/14/15 1346    Education provided Yes   Education Details progress in therapy, goals met   Person(s) Educated Patient   Methods Explanation;Demonstration   Comprehension Verbalized understanding;Returned demonstration          SLP Short Term Goals - 08/14/15 1347    SLP SHORT TERM GOAL #1   Title Pt will name 8 items for a simple category with occasional min A   Time 3   Period Weeks   Status On-going   SLP SHORT TERM GOAL #2   Title Pt will comprehend simple functional written material (menus, schedules, directions) with 75% accuracy and occasional minb A   Status Achieved   SLP SHORT TERM GOAL #3   Title Pt will write simple sentences and functional information (forms, orders, lists) with 80% accuracy and occasional min A.    Status Achieved   SLP SHORT TERM GOAL #4   Title Pt will utlize compensations for dysarthira during structured tasks with occasional min A   Status Achieved          SLP Long Term Goals - 08/14/15 1347    SLP LONG TERM GOAL #1   Title Pt will participate in 8 minute simple conversation with compensations  for dysarthria and aphasia with occasional min A   Status Achieved   SLP LONG TERM GOAL #2   Title Pt will comprhend simple 3-5 sentence paragraph with 80% accuracy and rare min A   Time 7   Period Weeks   Status Achieved   SLP LONG TERM GOAL #3   Title pt will demo written language appropriate for functional tasks 95% success   Time 7   Period Weeks   Status Deferred  due to left UE pain           Plan - 08/14/15 1353    Clinical Impression Statement Structured naming tasks required min cues, however word finding with conversation has mostly resolved, which is atypical. Reading  comprehension at [place] level with supervisionn cues. Writing goal deferred due to c/o of significant pain  in left UE. Progress reviewed with pt - likely d/c 1-2 more sessions.    Speech Therapy Frequency 2x / week   Treatment/Interventions Compensatory techniques;Patient/family education;Functional tasks;Cueing hierarchy;Multimodal communcation approach;SLP instruction and feedback;Internal/external aids   Potential to Achieve Goals Good   Consulted and Agree with Plan of Care Patient        Problem List Patient Active Problem List   Diagnosis Date Noted  . Generalized anxiety disorder 04/25/2015  . History of depression 04/25/2015  . Raynaud's disease 04/25/2015  . GERD (gastroesophageal reflux disease) 04/25/2015  . Neuropathy ([place]) 04/25/2015  . Neck pain 04/25/2015  . Atrial fibrillation ([place]) 10/14/2014  . Acute CVA (cerebrovascular accident) ([place])   . Rash and nonspecific skin eruption   . Cerebral embolism with cerebral infarction ([place])   . 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 Tobacco abuse   . TIA (transient ischemic attack) 08/30/2014  . Hypercholesterolemia 08/30/2014    Keithon Mccoin, Annye Rusk MS, CCC-SLP 08/14/2015, 1:57 PM  [facility] [address] [place], [place], [address] Phone: [phone]   Fax:  [phone]   Name: Suleima Ohlendorf MRN: [id number] Date of Birth: November 08, 1960

## 2015-08-16 ENCOUNTER — Ambulatory Visit: Payer: Medicare Other

## 2015-08-16 ENCOUNTER — Ambulatory Visit: Payer: Medicare Other | Admitting: Physical Therapy

## 2015-08-16 ENCOUNTER — Ambulatory Visit (INDEPENDENT_AMBULATORY_CARE_PROVIDER_SITE_OTHER): Payer: Medicare Other | Admitting: *Deleted

## 2015-08-16 ENCOUNTER — Encounter: Payer: Self-pay | Admitting: Internal Medicine

## 2015-08-16 DIAGNOSIS — I639 Cerebral infarction, unspecified: Secondary | ICD-10-CM

## 2015-08-16 LAB — CUP PACEART REMOTE DEVICE CHECK: Date Time Interrogation Session: 20161110180802

## 2015-08-16 NOTE — Progress Notes (Signed)
Carelink Summary Report / Loop recorder 

## 2015-08-17 ENCOUNTER — Encounter: Payer: Self-pay | Admitting: Internal Medicine

## 2015-08-17 ENCOUNTER — Ambulatory Visit: Payer: Medicare Other | Admitting: Occupational Therapy

## 2015-08-17 DIAGNOSIS — R279 Unspecified lack of coordination: Secondary | ICD-10-CM

## 2015-08-17 DIAGNOSIS — I69398 Other sequelae of cerebral infarction: Secondary | ICD-10-CM

## 2015-08-17 DIAGNOSIS — R269 Unspecified abnormalities of gait and mobility: Secondary | ICD-10-CM | POA: Diagnosis not present

## 2015-08-17 DIAGNOSIS — R2681 Unsteadiness on feet: Secondary | ICD-10-CM | POA: Diagnosis not present

## 2015-08-17 DIAGNOSIS — R29898 Other symptoms and signs involving the musculoskeletal system: Secondary | ICD-10-CM

## 2015-08-17 DIAGNOSIS — I69351 Hemiplegia and hemiparesis following cerebral infarction affecting right dominant side: Secondary | ICD-10-CM | POA: Diagnosis not present

## 2015-08-17 DIAGNOSIS — R278 Other lack of coordination: Secondary | ICD-10-CM

## 2015-08-17 DIAGNOSIS — M79602 Pain in left arm: Secondary | ICD-10-CM

## 2015-08-17 DIAGNOSIS — R4701 Aphasia: Secondary | ICD-10-CM | POA: Diagnosis not present

## 2015-08-17 DIAGNOSIS — R209 Unspecified disturbances of skin sensation: Secondary | ICD-10-CM

## 2015-08-17 NOTE — Therapy (Signed)
[facility] [address] [place], [place], [address] Phone: [phone]   Fax:  [phone]  Occupational Therapy Treatment  Patient Details  Name: Dawn Escobar MRN: [id number] Date of Birth: Jul 02, 1961 Referring Provider: Dr. Pearlean Brownie  Encounter Date: 08/17/2015      OT End of Session - 08/17/15 1206    Visit Number 2   Number of Visits 17   Date for OT Re-Evaluation 10/11/14   Authorization Type Medicare   Authorization - Visit Number 2   Authorization - Number of Visits 10   OT Start Time 1155   OT Stop Time 1235   OT Time Calculation (min) 40 min   Activity Tolerance Patient tolerated treatment well   Behavior During Therapy [facility] for tasks assessed/performed      Past Medical History  Diagnosis Date  . PTSD (post-traumatic stress disorder)   . Raynaud's disease   . Anxiety   . Hypercholesteremia   . Stroke (HCC)   . GERD (gastroesophageal reflux disease) 04/25/2015  . Hereditary and idiopathic peripheral neuropathy 04/25/2015  . Hypertension   . Atrial fibrillation (HCC)   . Anxiety   . Migraine     Past Surgical History  Procedure Laterality Date  . Wrist surgery    . Abdominal hysterectomy    . Knee surgery    . Hip surgery  Right  . Tee without cardioversion N/A 09/05/2014    Procedure: TRANSESOPHAGEAL ECHOCARDIOGRAM (TEE);  Surgeon: Wendall Stade, MD;  Location: [facility] END[facility]COPY;  Service: Cardiovascular;  Laterality: N/A;  loop after  . 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  Visit Diagnosis:  Right arm weakness  Left arm pain  Decreased coordination  Altered sensation due to recent stroke    Treatment:Pt arrived reporting significant pain in left shoulder/ scapula radiating to arm. Hotpack applied to shoulder x 10 mins, while pt performed fine motor tasks with RUE, no adverse  reactions. Pt was instructed in in putty HEP and beginning coordination tasks for RUE, min-mod v.c. Due to sensory impairment. Kinesiotape applied to relax traps and promote scapular retraction LUE, pt reported almost immediate pain reduction from 10/10 to 5/10.                         OT Education - 08/17/15 1301    Education provided Yes   Education Details kinesiotape wear/ care, putty, coordination activities   Person(s) Educated Patient   Methods Explanation;Demonstration;Verbal cues;Handout   Comprehension Verbalized understanding;Returned demonstration          OT Short Term Goals - 08/14/15 1523    OT SHORT TERM GOAL #1   Title I with HEP.   Time 4   Period Weeks   Status New   OT SHORT TERM GOAL #2   Title Pt will verbalize understanding of positioning,and strategies to minimize LUE pain   Time 4   Period Weeks   Status New   OT SHORT TERM GOAL #3   Title Pt will improve RUE grip strength to 22 lbs for increased functional use   Time 4   Period Weeks   Status New   OT SHORT TERM GOAL #4   Title Pt will demonstrate improved fine motor coordination as evidenced by decreasing LUE 9 hole peg test score to 45 secs or less.   Time  4   Period Weeks   Status New           OT Long Term Goals - 08/14/15 1529    OT LONG TERM GOAL #1   Title Pt will report ability to use LUE as dominant hand for ADLS/ IADLS 75% of the time with pain less than or equal to 5/10.   Time 8   Period Weeks   Status New   OT LONG TERM GOAL #2   Title Pt will increase LUE grip strength to 48lbs or greater for increased functional use.   Time 8   Period Weeks   Status New   OT LONG TERM GOAL #3   Title Pt will demonstrate improved fine motor coordination as evidenced by decreasing RUE 9 hole peg test score to 40 secs or less.    Time 8   Period Weeks   Status New   OT LONG TERM GOAL #4   Title Pt will demonstrate adequate RUE strength to retrieve 3 lbs from overhead  shelf x3 reps without dropping.   Time 8   Status New   OT LONG TERM GOAL #5   Title Pt will demonstrate ability to perform basic home managment/ cooking task modified independently demonstrating good safety awareness.   Time 8   Period Weeks   Status New               Plan - 08/17/15 1202    Clinical Impression Statement Pt continues to demonstrate significant pain in left shoulder and RUE weakness/ coordination  deficits. Pt is progressing towards goals.   Pt will benefit from skilled therapeutic intervention in order to improve on the following deficits (Retired) Decreased strength;Decreased mobility;Decreased cognition;Pain;Impaired UE functional use;Decreased balance;Decreased knowledge of use of DME;Decreased knowledge of precautions;Decreased activity tolerance;Impaired sensation;Decreased safety awareness;Decreased endurance;Impaired flexibility;Decreased range of motion;Decreased coordination;Abnormal gait   Rehab Potential Good   OT Frequency 2x / week   OT Duration 8 weeks   OT Treatment/Interventions Self-care/ADL training;Therapeutic exercise;Patient/family education;Balance training;Splinting;Manual Therapy;Neuromuscular education;Ultrasound;Therapeutic exercises;Therapeutic activities;DME and/or AE instruction;Parrafin;Fluidtherapy;Cognitive remediation/compensation;Passive range of motion;Moist Heat;Contrast Bath   Plan  assess how kinesiotspe workedstrength, coordination, pain reduction   OT Home Exercise Plan 08/17/15   Consulted and Agree with Plan of Care Patient        Problem List Patient Active Problem List   Diagnosis Date Noted  . Generalized anxiety disorder 04/25/2015  . History of depression 04/25/2015  . Raynaud's disease 04/25/2015  . GERD (gastroesophageal reflux disease) 04/25/2015  . Neuropathy (HCC) 04/25/2015  . Neck pain 04/25/2015  . Atrial fibrillation (HCC) 10/14/2014  . Acute CVA (cerebrovascular accident) (HCC)   . Rash and  nonspecific skin eruption   . Cerebral embolism with cerebral infarction (HCC)   . 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 Tobacco abuse   . TIA (transient ischemic attack) 08/30/2014  . Hypercholesterolemia 08/30/2014    Sahir Tolson 08/17/2015, 1:02 PM Keene BreathKathryn Gergory Biello, OTR/L [phone] Phone: [phone] 1:02 PM 08/17/2015 [facility] [address] [place], [place], [address] Phone: [phone]   Fax:  [phone]  Name: Dawn Escobar MRN: [id number] Date of Birth: August 16, 1961

## 2015-08-17 NOTE — Patient Instructions (Signed)
Kinesiotape- You may wear tape for 3-5 days as long as you are not have problems/ irritation. If any problems get tape wet and remove slowly and gently Pat tape dry after showering   1. Grip Strengthening (Resistive Putty)   Squeeze putty using thumb and all fingers. Repeat _20___ times. Do __2__ sessions per day.   2. Roll putty into tube on table and pinch between each finger and thumb x 10 reps each. (can do ring and small finger together)   Practice flipping playing cards and dealing with thumb with right hand Practice picking up coins with right hand and place in a continer  Copyright  VHI. All rights reserved.

## 2015-08-20 ENCOUNTER — Other Ambulatory Visit: Payer: Self-pay | Admitting: Medical

## 2015-08-21 ENCOUNTER — Encounter: Payer: Self-pay | Admitting: Occupational Therapy

## 2015-08-21 ENCOUNTER — Ambulatory Visit: Payer: Medicare Other | Admitting: Physical Therapy

## 2015-08-21 ENCOUNTER — Ambulatory Visit: Payer: Medicare Other | Admitting: Speech Pathology

## 2015-08-21 ENCOUNTER — Ambulatory Visit: Payer: Medicare Other | Admitting: Occupational Therapy

## 2015-08-21 DIAGNOSIS — M79602 Pain in left arm: Secondary | ICD-10-CM

## 2015-08-21 DIAGNOSIS — R29898 Other symptoms and signs involving the musculoskeletal system: Secondary | ICD-10-CM

## 2015-08-21 DIAGNOSIS — R2681 Unsteadiness on feet: Secondary | ICD-10-CM | POA: Diagnosis not present

## 2015-08-21 DIAGNOSIS — R4701 Aphasia: Secondary | ICD-10-CM | POA: Diagnosis not present

## 2015-08-21 DIAGNOSIS — R279 Unspecified lack of coordination: Secondary | ICD-10-CM | POA: Diagnosis not present

## 2015-08-21 DIAGNOSIS — R278 Other lack of coordination: Secondary | ICD-10-CM

## 2015-08-21 DIAGNOSIS — I69351 Hemiplegia and hemiparesis following cerebral infarction affecting right dominant side: Secondary | ICD-10-CM

## 2015-08-21 DIAGNOSIS — R269 Unspecified abnormalities of gait and mobility: Secondary | ICD-10-CM | POA: Diagnosis not present

## 2015-08-21 NOTE — Therapy (Signed)
[facility] [address] [place], [place], [address] Phone: [phone]   Fax:  [phone]  Speech Language Pathology Treatment  Patient Details  Name: Dawn Escobar MRN: [id number] Date of Birth: 1961/06/09 No Data Recorded  Encounter Date: 08/21/2015      End of Session - 08/21/15 1404    Visit Number 5   Number of Visits 17   Date for SLP Re-Evaluation 08/27/15   SLP Start Time 1317   SLP Stop Time  1400   SLP Time Calculation (min) 43 min   Activity Tolerance Patient limited by pain      Past Medical History  Diagnosis Date  . PTSD (post-traumatic stress disorder)   . Raynaud's disease   . Anxiety   . Hypercholesteremia   . Stroke (HCC)   . GERD (gastroesophageal reflux disease) 04/25/2015  . Hereditary and idiopathic peripheral neuropathy 04/25/2015  . Hypertension   . Atrial fibrillation (HCC)   . Anxiety   . Migraine     Past Surgical History  Procedure Laterality Date  . Wrist surgery    . Abdominal hysterectomy    . Knee surgery    . Hip surgery  Right  . Tee without cardioversion N/A 09/05/2014    Procedure: TRANSESOPHAGEAL ECHOCARDIOGRAM (TEE);  Surgeon: Wendall StadePeter C Nishan, MD;  [facility];  Service: Cardiovascular;  Laterality: N/A;  loop after  . Loop recorder implant N/A 09/05/2014    MDT LINQ implanted by Dr Johney FrameAllred for cryptogenic stroke    There were no vitals filed for this visit.  Visit Diagnosis: Expressive aphasia      Subjective Assessment - 08/21/15 1325    Subjective "I didn't go the homework"   Pain Score 0               ADULT SLP TREATMENT - 08/21/15 1325    General Information   Behavior/Cognition Alert;Cooperative;Pleasant mood   Treatment Provided   Treatment provided Cognitive-Linquistic   Pain Assessment   Pain Assessment 0-10   Pain Score 4    Pain Location lower back   Pain Descriptors / Indicators Aching   Pain Intervention(s)  Monitored during session   Cognitive-Linquistic Treatment   Treatment focused on Aphasia   Skilled Treatment Pt reports her eyes get tired when she reads outside of therapy. Pt generated sentence  with  2 given words with usual extended time. Syntx and semantics intact with sentences. Extended time with this structured task is not noted in conversational spech, which is inconsistent. Structured speech tasks describing moderately complex leisure activiies for ST to guess with supervision cues, fluent, grammatic. Pt named 8 items for simple category  (Thanksgiving dinner),     Assessment / Recommendations / Plan   Plan Continue with current plan of care          SLP Education - 08/21/15 1357    Education provided Yes   Education Details progress in therapy   Person(s) Educated Patient   Methods Explanation;Demonstration;Verbal cues;Handout   Comprehension Verbalized understanding;Returned demonstration          SLP Short Term Goals - 08/21/15 1403    SLP SHORT TERM GOAL #1   Title Pt will name 8 items for a simple category with occasional min A   Time 3   Period Weeks   Status Achieved   SLP SHORT TERM GOAL #2   Title Pt will comprehend simple functional written material (menus, schedules, directions) with 75% accuracy and occasional  minb A   Status Achieved   SLP SHORT TERM GOAL #3   Title Pt will write simple sentences and functional information (forms, orders, lists) with 80% accuracy and occasional min A.    Status Achieved   SLP SHORT TERM GOAL #4   Title Pt will utlize compensations for dysarthira during structured tasks with occasional min A   Status Achieved          SLP Long Term Goals - 08/21/15 1404    SLP LONG TERM GOAL #1   Title Pt will participate in 8 minute simple conversation with compensations for dysarthria and aphasia with occasional min A   Status Achieved   SLP LONG TERM GOAL #2   Title Pt will comprhend simple 3-5 sentence paragraph with 80%  accuracy and rare min A   Time 7   Period Weeks   Status Achieved   SLP LONG TERM GOAL #3   Title pt will demo written language appropriate for functional tasks 95% success   Time 7   Period Weeks   Status Deferred  due to left UE pain           Plan - 08/21/15 1357    Clinical Impression Statement Strucutred speech tasks (descriptives) and Simlple conversation is fluent and grammatic. With conversation re: Thanksgiving dinner, pt named food items with supervision cues. Plan to d/c next session.    Speech Therapy Frequency 2x / week   Duration 1 week   Treatment/Interventions Compensatory techniques;Patient/family education;Functional tasks;Cueing hierarchy;Multimodal communcation approach;SLP instruction and feedback;Internal/external aids   Potential to Achieve Goals Good   Consulted and Agree with Plan of Care Patient        Problem List Patient Active Problem List   Diagnosis Date Noted  . Generalized anxiety disorder 04/25/2015  . History of depression 04/25/2015  . Raynaud's disease 04/25/2015  . GERD (gastroesophageal reflux disease) 04/25/2015  . Neuropathy (HCC) 04/25/2015  . Neck pain 04/25/2015  . Atrial fibrillation (HCC) 10/14/2014  . Acute CVA (cerebrovascular accident) (HCC)   . Rash and nonspecific skin eruption   . Cerebral embolism with cerebral infarction (HCC)   . 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 Tobacco abuse   . TIA (transient ischemic attack) 08/30/2014  . Hypercholesterolemia 08/30/2014    Brynlei Klausner, Radene Journey MS, CCC-SLP 08/21/2015, 2:05 PM  [place] [facility] [address] [place], [place], [address] Phone: [phone]   Fax:  [phone]   Name: Dawn Escobar MRN: [id number] Date of Birth: 28-Mar-1961

## 2015-08-21 NOTE — Patient Instructions (Signed)
Stroke Prevention Some medical conditions and behaviors are associated with an increased chance of having a stroke. You may prevent a stroke by making healthy choices and managing medical conditions. HOW CAN I REDUCE MY RISK OF HAVING A STROKE?   Stay physically active. Get at least 30 minutes of activity on most or all days.  Do not smoke. It may also be helpful to avoid exposure to secondhand smoke.  Limit alcohol use. Moderate alcohol use is considered to be:  No more than 2 drinks per day for men.  No more than 1 drink per day for nonpregnant women.  Eat healthy foods. This involves:  Eating 5 or more servings of fruits and vegetables a day.  Making dietary changes that address high blood pressure (hypertension), high cholesterol, diabetes, or obesity.  Manage your cholesterol levels.  Making food choices that are high in fiber and low in saturated fat, trans fat, and cholesterol may control cholesterol levels.  Take any prescribed medicines to control cholesterol as directed by your health care provider.  Manage your diabetes.  Controlling your carbohydrate and sugar intake is recommended to manage diabetes.  Take any prescribed medicines to control diabetes as directed by your health care provider.  Control your hypertension.  Making food choices that are low in salt (sodium), saturated fat, trans fat, and cholesterol is recommended to manage hypertension.  Ask your health care provider if you need treatment to lower your blood pressure. Take any prescribed medicines to control hypertension as directed by your health care provider.  If you are 18-39 years of age, have your blood pressure checked every 3-5 years. If you are 40 years of age or older, have your blood pressure checked every year.  Maintain a healthy weight.  Reducing calorie intake and making food choices that are low in sodium, saturated fat, trans fat, and cholesterol are recommended to manage  weight.  Stop drug abuse.  Avoid taking birth control pills.  Talk to your health care provider about the risks of taking birth control pills if you are over 35 years old, smoke, get migraines, or have ever had a blood clot.  Get evaluated for sleep disorders (sleep apnea).  Talk to your health care provider about getting a sleep evaluation if you snore a lot or have excessive sleepiness.  Take medicines only as directed by your health care provider.  For some people, aspirin or blood thinners (anticoagulants) are helpful in reducing the risk of forming abnormal blood clots that can lead to stroke. If you have the irregular heart rhythm of atrial fibrillation, you should be on a blood thinner unless there is a good reason you cannot take them.  Understand all your medicine instructions.  Make sure that other conditions (such as anemia or atherosclerosis) are addressed. SEEK IMMEDIATE MEDICAL CARE IF:   You have sudden weakness or numbness of the face, arm, or leg, especially on one side of the body.  Your face or eyelid droops to one side.  You have sudden confusion.  You have trouble speaking (aphasia) or understanding.  You have sudden trouble seeing in one or both eyes.  You have sudden trouble walking.  You have dizziness.  You have a loss of balance or coordination.  You have a sudden, severe headache with no known cause.  You have new chest pain or an irregular heartbeat. Any of these symptoms may represent a serious problem that is an emergency. Do not wait to see if the symptoms will   go away. Get medical help at once. Call your local emergency services (911 in [place] Do not drive yourself to the hospital.   This information is not intended to replace advice given to you by your health care provider. Make sure you discuss any questions you have with your health care provider.   Document Released: 10/30/2004 Document Revised: 10/13/2014 Document Reviewed:  03/25/2013 Elsevier Interactive Patient Education 2016 Elsevier Inc.  

## 2015-08-21 NOTE — Therapy (Signed)
[facility] [address] [place], [place], [address] Phone: [phone]   Fax:  [phone]  Occupational Therapy Treatment  Patient Details  Name: Dawn Escobar MRN: [id number] Date of Birth: [birth year] Referring Provider: Dr. Pearlean Brownie  Encounter Date: 08/21/2015      OT End of Session - 08/21/15 1303    Visit Number 3   Number of Visits 17   Date for OT Re-Evaluation 10/11/14   Authorization Type Medicare   Authorization - Visit Number 3   Authorization - Number of Visits 10   OT Start Time 1100   OT Stop Time 1145   OT Time Calculation (min) 45 min   Activity Tolerance Patient tolerated treatment well      Past Medical History  Diagnosis Date  . PTSD (post-traumatic stress disorder)   . Raynaud's disease   . Anxiety   . Hypercholesteremia   . Stroke (HCC)   . GERD (gastroesophageal reflux disease) 04/25/2015  . Hereditary and idiopathic peripheral neuropathy 04/25/2015  . Hypertension   . Atrial fibrillation (HCC)   . Anxiety   . Migraine     Past Surgical History  Procedure Laterality Date  . Wrist surgery    . Abdominal hysterectomy    . Knee surgery    . Hip surgery  Right  . Tee without cardioversion N/A 09/05/2014    Procedure: TRANSESOPHAGEAL ECHOCARDIOGRAM (TEE);  Surgeon: Wendall Stade, MD;  Location: [facility] ENDOSCOPY;  Service: Cardiovascular;  Laterality: N/A;  loop after  . 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  Visit Diagnosis:  Right arm weakness  Left arm pain  Decreased coordination  Hemiparesis affecting right side as late effect of stroke [facility])      Subjective Assessment - 08/21/15 1110    Subjective  The tape helped with the pain   Pertinent History see Epic snapshot    Patient Stated Goals reduce shoulder pain    Currently in Pain? Yes   Pain Score 8    Pain Location  Arm   Pain Orientation Left   Pain Descriptors / Indicators Pins and needles;Throbbing   Pain Type Acute pain   Pain Onset 1 to 4 weeks ago   Pain Frequency Constant   Aggravating Factors  using it   Pain Relieving Factors taping, rest                      OT Treatments/Exercises (OP) - 08/21/15 0001    Exercises   Exercises Shoulder   Shoulder Exercises: ROM/Strengthening   Other ROM/Strengthening Exercises Seated: shoulder flexion x 10 reps with dowel. Pt only able to achieve 90-100* shoulder flexion. Then attempted shoulder abduction to Rt side with dowel, but pt struggled with initiation of movement. However, when pt asked to reach for pen in sh. abduction to Rt, she could do without difficulty.    Fine Motor Coordination   Fine Motor Coordination Small Pegboard   Small Pegboard Pt placing small pegs in pegboard Rt hand with min difficulty and drops. Copying design with min cues to copy correctly and/or correct. Pt then taking pegs out manipulating up to 3 at a time with mod difficulty and drops.    Manual Therapy   Manual Therapy Taping   Manual therapy comments Taping to relax upper traps, middle deltoid, and biceps and to activate trunk  extension and scapula retraction for pain managment of LUE   Kinesiotex Inhibit Muscle;Facilitate Muscle                  OT Short Term Goals - 08/14/15 1523    OT SHORT TERM GOAL #1   Title I with HEP.   Time 4   Period Weeks   Status New   OT SHORT TERM GOAL #2   Title Pt will verbalize understanding of positioning,and strategies to minimize LUE pain   Time 4   Period Weeks   Status New   OT SHORT TERM GOAL #3   Title Pt will improve RUE grip strength to 22 lbs for increased functional use   Time 4   Period Weeks   Status New   OT SHORT TERM GOAL #4   Title Pt will demonstrate improved fine motor coordination as evidenced by decreasing LUE 9 hole peg test score to 45 secs or less.   Time 4   Period Weeks    Status New           OT Long Term Goals - 08/14/15 1529    OT LONG TERM GOAL #1   Title Pt will report ability to use LUE as dominant hand for ADLS/ IADLS 75% of the time with pain less than or equal to 5/10.   Time 8   Period Weeks   Status New   OT LONG TERM GOAL #2   Title Pt will increase LUE grip strength to 48lbs or greater for increased functional use.   Time 8   Period Weeks   Status New   OT LONG TERM GOAL #3   Title Pt will demonstrate improved fine motor coordination as evidenced by decreasing RUE 9 hole peg test score to 40 secs or less.    Time 8   Period Weeks   Status New   OT LONG TERM GOAL #4   Title Pt will demonstrate adequate RUE strength to retrieve 3 lbs from overhead shelf x3 reps without dropping.   Time 8   Status New   OT LONG TERM GOAL #5   Title Pt will demonstrate ability to perform basic home managment/ cooking task modified independently demonstrating good safety awareness.   Time 8   Period Weeks   Status New               Plan - 08/21/15 1304    Clinical Impression Statement Pt demo inconsistent movement RUE. Pt demo ability to perform coordination ex's well.    Plan cane ex's   Consulted and Agree with Plan of Care Patient        Problem List Patient Active Problem List   Diagnosis Date Noted  . Generalized anxiety disorder 04/25/2015  . History of depression 04/25/2015  . Raynaud's disease 04/25/2015  . GERD (gastroesophageal reflux disease) 04/25/2015  . Neuropathy (HCC) 04/25/2015  . Neck pain 04/25/2015  . Atrial fibrillation (HCC) 10/14/2014  . Acute CVA (cerebrovascular accident) (HCC)   . Rash and nonspecific skin eruption   . Cerebral embolism with cerebral infarction (HCC)   . 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 Tobacco abuse   . TIA (transient ischemic attack) 08/30/2014  . Hypercholesterolemia 08/30/2014    Kelli Churn, OTR/L 08/21/2015, 1:07 PM  [facility] [address] [place], [place], [address] Phone: [phone]   Fax:  [phone]  Name: Dawn Escobar  MRN: [id number] Date of Birth: 07-09-1961

## 2015-08-21 NOTE — Patient Instructions (Signed)
Complete homework from last week

## 2015-08-21 NOTE — Therapy (Signed)
[facility] [address] [place], [place], [address] Phone: [phone]   Fax:  [phone]  Physical Therapy Treatment  Patient Details  Name: Dawn Escobar MRN: [id number] Date of Birth: August 02, 1961 No Data Recorded  Encounter Date: 08/21/2015      PT End of Session - 08/21/15 1458    Visit Number 6   Number of Visits 17   Date for PT Re-Evaluation 09/29/15   Authorization Type MCR-G codes every 10th visit   PT Start Time 1404   PT Stop Time 1447   PT Time Calculation (min) 43 min   Equipment Utilized During Treatment Gait belt   Activity Tolerance Patient limited by pain   Behavior During Therapy [facility] for tasks assessed/performed      Past Medical History  Diagnosis Date  . PTSD (post-traumatic stress disorder)   . Raynaud's disease   . Anxiety   . Hypercholesteremia   . Stroke (HCC)   . GERD (gastroesophageal reflux disease) 04/25/2015  . Hereditary and idiopathic peripheral neuropathy 04/25/2015  . Hypertension   . Atrial fibrillation (HCC)   . Anxiety   . Migraine     Past Surgical History  Procedure Laterality Date  . Wrist surgery    . Abdominal hysterectomy    . Knee surgery    . Hip surgery  Right  . Tee without cardioversion N/A 09/05/2014    Procedure: TRANSESOPHAGEAL ECHOCARDIOGRAM (TEE);  Surgeon: Wendall Stade, MD;  Location[facility] ENDOSCOPY;  Service: Cardiovascular;  Laterality: N/A;  loop after  . 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  Visit Diagnosis:  Unsteadiness      Subjective Assessment - 08/21/15 1406    Subjective Pt reports pain in L shoulder and arm better with kinesiotape.  Continues with decreased sensation RLE per pt report.   Pertinent History CVA with right hemiparesis 08/30/14, and again on 06/01/15, anxiety, PTSD, Raynauds   Diagnostic tests MRI 06/01/15: Early  subacute acute left MCA territory cortical infarct    Currently in Pain? Yes   Pain Score 4    Pain Location Back   Pain Orientation Lower   Pain Descriptors / Indicators Aching   Pain Type Chronic pain   Pain Onset More than a month ago   Pain Frequency Constant             [facility] Adult PT Treatment/Exercise - 08/21/15 1455    Transfers   Transfers Sit to Stand;Stand to Sit   Sit to Stand 6: Modified independent (Device/Increase time)   Stand to Sit 6: Modified independent (Device/Increase time)   Comments repeated x 10 x 2 for strengthening and weight bearing-tends to keep RLE flexed   Lumbar Exercises: Machines for Strengthening   Leg Press bil LE 60# x 20, RLE 40# x 10 x 2 and LLE 50# x 10 x 2   Knee/Hip Exercises: Standing   Forward Step Up Both;15 reps;Step Height: 8";Other (comment)  intermittent hand hold                PT Education - 08/21/15 1500    Education provided Yes   Education Details CVA education   Person(s) Educated Patient   Methods Explanation;Handout   Comprehension Verbalized understanding          PT Short Term Goals - 08/21/15 1501    PT SHORT TERM  GOAL #1   Title Pt will initiate HEP for strengthening and balance to decrease fall risk and improve functional mobility.  (Target Date: 08/28/15)   Time 4   Period Weeks   PT SHORT TERM GOAL #2   Title Pt will increase DGI to 22/24 in order to indicate increase in functional balance and decreased fall risk.    (Target Date: 08/28/15)   Time 4   Period Weeks   PT SHORT TERM GOAL #3   Title Pt will verbalize CVA warning signs independently to indicate decreased time to seek medical attention in case of future CVA.   (Target Date: 08/28/15)   Time 4   Period Weeks   Status Achieved   PT SHORT TERM GOAL #4   Title Pt will report no more than 2 point increase in back pain during therapy session to indicate that pain is decreased limiting factor in mobility.   (Target Date: 08/28/15)    Time 4   Period Weeks   PT SHORT TERM GOAL #5   Title Will assess 6MWT and increase by 150' in order to indicate increase in fucntional endurance.    (Target Date: 08/28/15)           PT Long Term Goals - 07/31/15 1843    PT LONG TERM GOAL #1   Title Pt will be independent with HEP for strengthening and balance in order to indicate decreased fall risk and improved functional mobility. (Target Date: 09/25/15)   Time 8   Period Weeks   PT LONG TERM GOAL #2   Title Pt will increase R hip strength to 4/5 to indicate increased strength and improved functional mobility.   (Target Date: 09/25/15)   Time 8   Period Weeks   PT LONG TERM GOAL #3   Title Pt will initiate participation in community fitness program to continue to improve functional mobility beyond therapy.   (Target Date: 09/25/15)   Time 8   Period Weeks   PT LONG TERM GOAL #4   Title Pt will ambulate 500' without AD over outdoor surfaces (paved, grass, curb, etc) at mod I level to indicate safe return to community.   (Target Date: 09/25/15)   Time 8   PT LONG TERM GOAL #5   Title Pt will increase 6MWT by 150' to indicate increase in functional endurance.   (Target Date: 09/25/15)               Plan - 08/21/15 1458    Clinical Impression Statement Pt tearful today expressing amount of stress from anniversary of her stroke as well as death of her mother.  Needing frequent rest breaks and reassurance.  Continue PT per POC.   Pt will benefit from skilled therapeutic intervention in order to improve on the following deficits Abnormal gait;Decreased activity tolerance;Decreased balance;Decreased cognition;Decreased coordination;Decreased endurance;Decreased mobility;Decreased strength;Difficulty walking;Impaired perceived functional ability;Impaired sensation;Impaired UE functional use;Postural dysfunction;Improper body mechanics;Dizziness   Rehab Potential Good   PT Frequency 2x / week   PT Duration 8 weeks   PT  Treatment/Interventions ADLs/Self Care Home Management;Electrical Stimulation;Moist Heat;Ultrasound;Gait training;Stair training;Functional mobility training;Therapeutic activities;Therapeutic exercise;Neuromuscular re-education;Balance training;Patient/family education;Manual techniques;Vestibular   PT Next Visit Plan RLE strengthening in closed chain and with resistance bands.  Attempt 6 minute walk test again (pt unable to complete on a previous session)   PT Home Exercise Plan back packet exercises and stretches - heel cord and hamstring   Consulted and Agree with Plan of Care Patient  Problem List Patient Active Problem List   Diagnosis Date Noted  . Generalized anxiety disorder 04/25/2015  . History of depression 04/25/2015  . Raynaud's disease 04/25/2015  . GERD (gastroesophageal reflux disease) 04/25/2015  . Neuropathy (HCC) 04/25/2015  . Neck pain 04/25/2015  . Atrial fibrillation (HCC) 10/14/2014  . Acute CVA (cerebrovascular accident) (HCC)   . Rash and nonspecific skin eruption   . Cerebral embolism with cerebral infarction (HCC)   . 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 Tobacco abuse   . TIA (transient ischemic attack) 08/30/2014  . Hypercholesterolemia 08/30/2014    Newell Coral 08/21/2015, 3:02 PM  [facility] [address] [place], [place], [address] Phone: [phone]   Fax:  [phone]  Name: Dawn Escobar MRN: [id number] Date of Birth: Jun 03, 1961    Newell Coral, [facility] 08/21/2015 3:02 PM Phone: [phone] Fax: [phone]

## 2015-08-23 ENCOUNTER — Ambulatory Visit: Payer: Medicare Other | Admitting: Physical Therapy

## 2015-08-23 ENCOUNTER — Ambulatory Visit: Payer: Medicare Other | Admitting: Speech Pathology

## 2015-08-23 ENCOUNTER — Ambulatory Visit: Payer: Medicare Other | Admitting: Occupational Therapy

## 2015-08-23 DIAGNOSIS — I69351 Hemiplegia and hemiparesis following cerebral infarction affecting right dominant side: Secondary | ICD-10-CM | POA: Diagnosis not present

## 2015-08-23 DIAGNOSIS — R29898 Other symptoms and signs involving the musculoskeletal system: Secondary | ICD-10-CM

## 2015-08-23 DIAGNOSIS — R279 Unspecified lack of coordination: Secondary | ICD-10-CM | POA: Diagnosis not present

## 2015-08-23 DIAGNOSIS — R4701 Aphasia: Secondary | ICD-10-CM | POA: Diagnosis not present

## 2015-08-23 DIAGNOSIS — M79602 Pain in left arm: Secondary | ICD-10-CM

## 2015-08-23 DIAGNOSIS — R269 Unspecified abnormalities of gait and mobility: Secondary | ICD-10-CM | POA: Diagnosis not present

## 2015-08-23 DIAGNOSIS — R2681 Unsteadiness on feet: Secondary | ICD-10-CM | POA: Diagnosis not present

## 2015-08-23 NOTE — Patient Instructions (Signed)
   Lie on back or sitting up tall, holding wand. Raise arms over head. Hold 5sec. Repeat 10 times per set.  Do 2-3 sessions per day.    ROM: Abduction - Wand   Holding wand with right hand palm up, push wand directly out to Rt side, leading with other hand palm down, until stretch is felt. Hold 5 seconds. Repeat 10 times per set. Do 2-3 sessions per day.    ROM: Extension - Wand (Standing)   Stand holding wand behind back, palms forward and raise approx. 6" Repeat 10 times per set.  Do 2-3 sessions per day.

## 2015-08-23 NOTE — Therapy (Signed)
[facility] [address] [place], [place], [address] Phone: [phone]   Fax:  [phone]  Occupational Therapy Treatment  Patient Details  Name: Dawn Escobar MRN: [id number] Date of Birth: [birth year] Referring Provider: Dr. Pearlean Brownie  Encounter Date: 08/23/2015      OT End of Session - 08/23/15 1605    Visit Number 4   Number of Visits 17   Date for OT Re-Evaluation 10/11/14   Authorization Type Medicare   Authorization - Visit Number 4   Authorization - Number of Visits 10   OT Start Time 1532   OT Stop Time 1615   OT Time Calculation (min) 43 min   Equipment Utilized During Treatment UBE   Activity Tolerance Patient tolerated treatment well      Past Medical History  Diagnosis Date  . PTSD (post-traumatic stress disorder)   . Raynaud's disease   . Anxiety   . Hypercholesteremia   . Stroke (HCC)   . GERD (gastroesophageal reflux disease) 04/25/2015  . Hereditary and idiopathic peripheral neuropathy 04/25/2015  . Hypertension   . Atrial fibrillation (HCC)   . Anxiety   . Migraine     Past Surgical History  Procedure Laterality Date  . Wrist surgery    . Abdominal hysterectomy    . Knee surgery    . Hip surgery  Right  . Tee without cardioversion N/A 09/05/2014    Procedure: TRANSESOPHAGEAL ECHOCARDIOGRAM (TEE);  Surgeon: Wendall Stade, MD;  Location: [facility] ENDOSCOPY;  Service: Cardiovascular;  Laterality: N/A;  loop after  . 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  Visit Diagnosis:  Right arm weakness  Left arm pain  Hemiparesis affecting right side as late effect of stroke (HCC)      Subjective Assessment - 08/23/15 1540    Subjective  I fell last night on my Lt side which makes my pain worse   Patient Stated Goals reduce shoulder pain    Currently in Pain? Yes   Pain Score 9    Pain  Location Shoulder   Pain Orientation Left   Pain Descriptors / Indicators Sharp   Pain Type Chronic pain   Pain Frequency Constant   Aggravating Factors  using it   Pain Relieving Factors taping, rest                      OT Treatments/Exercises (OP) - 08/23/15 0001    Shoulder Exercises: ROM/Strengthening   UBE (Upper Arm Bike) x 10 min. Level 3 (forwards/backwards) with rest break   Other ROM/Strengthening Exercises Pt issued cane HEP and performed each x 10 reps (see pt instructions)                 OT Education - 08/23/15 1559    Education provided Yes   Education Details cane HEP   Person(s) Educated Patient   Methods Explanation;Demonstration;Handout   Comprehension Verbalized understanding;Returned demonstration          OT Short Term Goals - 08/23/15 1611    OT SHORT TERM GOAL #1   Title I with HEP.   Time 4   Period Weeks   Status New   OT SHORT TERM GOAL #2   Title Pt will verbalize understanding of positioning,and strategies to minimize LUE pain   Time 4   Period Weeks   Status  New   OT SHORT TERM GOAL #3   Title Pt will improve RUE grip strength to 22 lbs for increased functional use   Time 4   Period Weeks   Status New   OT SHORT TERM GOAL #4   Title Pt will demonstrate improved fine motor coordination as evidenced by decreasing RUE 9 hole peg test score to 45 secs or less.   Time 4   Period Weeks   Status New           OT Long Term Goals - 08/14/15 1529    OT LONG TERM GOAL #1   Title Pt will report ability to use LUE as dominant hand for ADLS/ IADLS 75% of the time with pain less than or equal to 5/10.   Time 8   Period Weeks   Status New   OT LONG TERM GOAL #2   Title Pt will increase LUE grip strength to 48lbs or greater for increased functional use.   Time 8   Period Weeks   Status New   OT LONG TERM GOAL #3   Title Pt will demonstrate improved fine motor coordination as evidenced by decreasing RUE 9 hole peg  test score to 40 secs or less.    Time 8   Period Weeks   Status New   OT LONG TERM GOAL #4   Title Pt will demonstrate adequate RUE strength to retrieve 3 lbs from overhead shelf x3 reps without dropping.   Time 8   Status New   OT LONG TERM GOAL #5   Title Pt will demonstrate ability to perform basic home managment/ cooking task modified independently demonstrating good safety awareness.   Time 8   Period Weeks   Status New               Plan - 08/23/15 1612    Clinical Impression Statement Pt with increased pain at LUE today secondary to reports of falling on Lt side yesterday.    Plan gripper activity bilateral hands, taping LUE to manage pain   OT Home Exercise Plan 08/17/15: coordination and putty HEP. 08/23/15: Cane HEP    Consulted and Agree with Plan of Care Patient        Problem List Patient Active Problem List   Diagnosis Date Noted  . Generalized anxiety disorder 04/25/2015  . History of depression 04/25/2015  . Raynaud's disease 04/25/2015  . GERD (gastroesophageal reflux disease) 04/25/2015  . Neuropathy (HCC) 04/25/2015  . Neck pain 04/25/2015  . Atrial fibrillation (HCC) 10/14/2014  . Acute CVA (cerebrovascular accident) (HCC)   . Rash and nonspecific skin eruption   . Cerebral embolism with cerebral infarction (HCC)   . 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 Tobacco abuse   . TIA (transient ischemic attack) 08/30/2014  . Hypercholesterolemia 08/30/2014    Kelli ChurnBallie, Tagg Eustice Johnson, OTR/L 08/23/2015, 4:16 PM  [facility] [address] [place], [place], [address] Phone: [phone]   Fax:  [phone]  Name: Dawn Escobar MRN: [id number] Date of Birth: March 06, 1961

## 2015-08-23 NOTE — Therapy (Signed)
[facility] [address] [place], [place], [address] Phone: [phone]   Fax:  [phone]  Speech Language Pathology Treatment  Patient Details  Name: Dawn Escobar MRN: [id number] Date of Birth: 1961/07/15 Dr. Ulyses Jarred  Encounter Date: 08/23/2015      End of Session - 08/23/15 1354    SLP Start Time 1315   SLP Stop Time  1354   SLP Time Calculation (min) 39 min      Past Medical History  Diagnosis Date  . PTSD (post-traumatic stress disorder)   . Raynaud's disease   . Anxiety   . Hypercholesteremia   . Stroke ([place])   . GERD (gastroesophageal reflux disease) 04/25/2015  . Hereditary and idiopathic peripheral neuropathy 04/25/2015  . Hypertension   . Atrial fibrillation ([place])   . Anxiety   . Migraine     Past Surgical History  Procedure Laterality Date  . Wrist surgery    . Abdominal hysterectomy    . Knee surgery    . Hip surgery  Right  . Tee without cardioversion N/A 09/05/2014    Procedure: TRANSESOPHAGEAL ECHOCARDIOGRAM (TEE);  Surgeon: Josue Hector, MD;  Location: [facility] ENDOSCOPY;  Service: Cardiovascular;  Laterality: N/A;  loop after  . 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  Visit Diagnosis: Expressive aphasia      Subjective Assessment - 08/23/15 1322    Subjective "I fell last night - I bent over to move something and fell on my left shoulder"               ADULT SLP TREATMENT - 08/23/15 1323    General Information   Behavior/Cognition Alert;Cooperative;Pleasant mood   Treatment Provided   Treatment provided Cognitive-Linquistic   Pain Assessment   Pain Assessment 0-10   Pain Score 8    Pain Location left shoulder and arm   Pain Descriptors / Indicators Aching   Pain Intervention(s) Monitored during session   Cognitive-Linquistic Treatment   Treatment focused on  Aphasia   Skilled Treatment Simple conversation over 10 minutes with less than 2 word finding episodes that pt self corrected. Pt defined idioms  and similarities/differences with supervision cues   Assessment / Recommendations / Plan   Plan Discharge SLP treatment due to (comment)  goals met   Progression Toward Goals   Progression toward goals Goals met, education completed, patient discharged from [place]  Visits from Start of Care: 6  Current functional level related to goals / functional outcomes: See goals below   Remaining deficits: Occasional word finding episodes, pt uses compensations successfully   Education / Equipment: Compensations for aphasia and dysarthria Plan: Patient agrees to discharge.  Patient goals were met. Patient is being discharged due to meeting the stated rehab goals.  ?????           SLP Short Term Goals - 08/23/15 1339    SLP SHORT TERM GOAL #1   Title Pt will name 8 items for a simple category with occasional min A   Time 3   Period Weeks   Status Achieved   SLP SHORT TERM GOAL #2   Title Pt will comprehend simple functional written material (menus, schedules, directions) with 75% accuracy and occasional minb A   Status Achieved   SLP SHORT TERM GOAL #3  Title Pt will write simple sentences and functional information (forms, orders, lists) with 80% accuracy and occasional min A.    Status Achieved   SLP SHORT TERM GOAL #4   Title Pt will utlize compensations for dysarthira during structured tasks with occasional min A   Status Achieved          SLP Long Term Goals - August 25, 2015 1339    SLP LONG TERM GOAL #1   Title Pt will participate in 8 minute simple conversation with compensations for dysarthria and aphasia with occasional min A   Status Achieved   SLP LONG TERM GOAL #2   Title Pt will comprhend simple 3-5 sentence paragraph with 80% accuracy and rare min A   Time 7   Period Weeks   Status  Achieved   SLP LONG TERM GOAL #3   Title pt will demo written language appropriate for functional tasks 95% success   Baseline deferred due to UE pain   Time 7   Period Weeks   Status Deferred  due to left UE pain           Plan - 2015/08/25 1337    Clinical Impression Statement Simple conversation over 15 minutes with compensations for word finding used 1x with mod I. Goals met, verbal expression is WFL.   Speech Therapy Frequency 2x / week   Duration 1 week   Treatment/Interventions Compensatory techniques;Patient/family education;Functional tasks;Cueing hierarchy;Multimodal communcation approach;SLP instruction and feedback;Internal/external aids   Potential to Achieve Goals Good   Consulted and Agree with Plan of Care Patient          G-Codes - 08/25/2015 1349    Functional Assessment Tool Used noms   Functional Limitations Spoken language expressive   Spoken Language Expression Goal Status [phone]) At least 1 percent but less than 20 percent impaired, limited or restricted   Spoken Language Expression Discharge Status [phone]) At least 1 percent but less than 20 percent impaired, limited or restricted      Problem List Patient Active Problem List   Diagnosis Date Noted  . Generalized anxiety disorder 04/25/2015  . History of depression 04/25/2015  . Raynaud's disease 04/25/2015  . GERD (gastroesophageal reflux disease) 04/25/2015  . Neuropathy ([place]) 04/25/2015  . Neck pain 04/25/2015  . Atrial fibrillation ([place]) 10/14/2014  . Acute CVA (cerebrovascular accident) ([place])   . Rash and nonspecific skin eruption   . Cerebral embolism with cerebral infarction ([place])   . 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 Tobacco abuse   . TIA (transient ischemic attack) 08/30/2014  . Hypercholesterolemia 08/30/2014    [place] Proudfoot, Annye Rusk MS, CCC-SLP 08-25-2015, 1:55 PM  [place] [address]  [place], [place], [address] Phone: [phone]   Fax:  [phone]   Name: Jannatul Wojdyla MRN: [id number] Date of Birth: 10-19-1960

## 2015-08-27 ENCOUNTER — Ambulatory Visit: Payer: Medicare Other | Admitting: Rehabilitation

## 2015-08-27 ENCOUNTER — Encounter: Payer: Self-pay | Admitting: Rehabilitation

## 2015-08-27 ENCOUNTER — Ambulatory Visit: Payer: Medicare Other | Admitting: Occupational Therapy

## 2015-08-27 DIAGNOSIS — I69351 Hemiplegia and hemiparesis following cerebral infarction affecting right dominant side: Secondary | ICD-10-CM

## 2015-08-27 DIAGNOSIS — M5441 Lumbago with sciatica, right side: Secondary | ICD-10-CM

## 2015-08-27 DIAGNOSIS — R2681 Unsteadiness on feet: Secondary | ICD-10-CM | POA: Diagnosis not present

## 2015-08-27 DIAGNOSIS — R279 Unspecified lack of coordination: Secondary | ICD-10-CM

## 2015-08-27 DIAGNOSIS — R269 Unspecified abnormalities of gait and mobility: Secondary | ICD-10-CM | POA: Diagnosis not present

## 2015-08-27 DIAGNOSIS — R29898 Other symptoms and signs involving the musculoskeletal system: Secondary | ICD-10-CM

## 2015-08-27 DIAGNOSIS — R278 Other lack of coordination: Secondary | ICD-10-CM

## 2015-08-27 DIAGNOSIS — R4701 Aphasia: Secondary | ICD-10-CM | POA: Diagnosis not present

## 2015-08-27 DIAGNOSIS — M79602 Pain in left arm: Secondary | ICD-10-CM

## 2015-08-27 NOTE — Therapy (Signed)
[facility] [address] [place], [place], [address] Phone: [phone]   Fax:  [phone]  Occupational Therapy Treatment  Patient Details  Name: Dawn Escobar MRN: [id number] Date of Birth: 1961/04/13 Referring Provider: Dr. Pearlean Brownie  Encounter Date: 08/27/2015      OT End of Session - 08/27/15 1356    Visit Number 5   Number of Visits 17   Date for OT Re-Evaluation 10/11/14   Authorization Type Medicare   Authorization - Visit Number 6   Authorization - Number of Visits 10   OT Start Time 1108   OT Stop Time 1146   OT Time Calculation (min) 38 min   Activity Tolerance Patient tolerated treatment well   Behavior During Therapy [facility] for tasks assessed/performed      Past Medical History  Diagnosis Date  . PTSD (post-traumatic stress disorder)   . Raynaud's disease   . Anxiety   . Hypercholesteremia   . Stroke (HCC)   . GERD (gastroesophageal reflux disease) 04/25/2015  . Hereditary and idiopathic peripheral neuropathy 04/25/2015  . Hypertension   . Atrial fibrillation (HCC)   . Anxiety   . Migraine     Past Surgical History  Procedure Laterality Date  . Wrist surgery    . Abdominal hysterectomy    . Knee surgery    . Hip surgery  Right  . Tee without cardioversion N/A 09/05/2014    Procedure: TRANSESOPHAGEAL ECHOCARDIOGRAM (TEE);  Surgeon: Wendall Stade, MD;  Location: [facility] ENDOSCOPY;  Service: Cardiovascular;  Laterality: N/A;  loop after  . 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  Visit Diagnosis:  Right arm weakness  Left arm pain  Hemiparesis affecting right side as late effect of stroke (HCC)  Decreased coordination      Subjective Assessment - 08/27/15 1129    Subjective  Pt reports left shoulder pain 7/10   Pertinent History see Epic snapshot    Patient Stated Goals reduce shoulder  pain    Currently in Pain? Yes   Pain Score 7    Pain Descriptors / Indicators Sharp   Pain Type Chronic pain   Pain Onset More than a month ago   Pain Frequency Constant   Aggravating Factors  use   Pain Relieving Factors taping, rest   Multiple Pain Sites No       Treatment: Fine Motor Coordination  Small Pegboard    Small Pegboard  Pt placing small pegs in pegboard Rt hand with min difficulty and drops. Copying design with min cues to copy correctly and/or correct. Manual Therapy    Manual Therapy  Taping    Manual therapy comments  Taping to relax upper traps, middle deltoid, and biceps and to activate trunk extension and scapula retraction for pain managment of LUE    Kinesiotex  Inhibit Muscle;Facilitate Muscle    Reviewed cane HEP for shoulder flexion, extension, chest press and shoulder abduction bilaterally, min v.c./ difficulty, pt noted to have tremors/ ataxic movements, 10-20 reps each exercise, min v.c.                          OT Short Term Goals - 08/23/15 1611    OT SHORT TERM GOAL #1   Title I with HEP.   Time 4   Period Weeks   Status New  OT SHORT TERM GOAL #2   Title Pt will verbalize understanding of positioning,and strategies to minimize LUE pain   Time 4   Period Weeks   Status New   OT SHORT TERM GOAL #3   Title Pt will improve RUE grip strength to 22 lbs for increased functional use   Time 4   Period Weeks   Status New   OT SHORT TERM GOAL #4   Title Pt will demonstrate improved fine motor coordination as evidenced by decreasing RUE 9 hole peg test score to 45 secs or less.   Time 4   Period Weeks   Status New           OT Long Term Goals - 08/14/15 1529    OT LONG TERM GOAL #1   Title Pt will report ability to use LUE as dominant hand for ADLS/ IADLS 75% of the time with pain less than or equal to 5/10.   Time 8   Period Weeks   Status New   OT LONG TERM GOAL #2   Title Pt will increase LUE grip strength  to 48lbs or greater for increased functional use.   Time 8   Period Weeks   Status New   OT LONG TERM GOAL #3   Title Pt will demonstrate improved fine motor coordination as evidenced by decreasing RUE 9 hole peg test score to 40 secs or less.    Time 8   Period Weeks   Status New   OT LONG TERM GOAL #4   Title Pt will demonstrate adequate RUE strength to retrieve 3 lbs from overhead shelf x3 reps without dropping.   Time 8   Status New   OT LONG TERM GOAL #5   Title Pt will demonstrate ability to perform basic home managment/ cooking task modified independently demonstrating good safety awareness.   Time 8   Period Weeks   Status New               Plan - 08/27/15 1352    Clinical Impression Statement Pt is progressing towards goals for RUE functional use. Pt continues to be limited in LUEfunctional use by pain.   Pt will benefit from skilled therapeutic intervention in order to improve on the following deficits (Retired) Decreased strength;Decreased mobility;Decreased cognition;Pain;Impaired UE functional use;Decreased balance;Decreased knowledge of use of DME;Decreased knowledge of precautions;Decreased activity tolerance;Impaired sensation;Decreased safety awareness;Decreased endurance;Impaired flexibility;Decreased range of motion;Decreased coordination;Abnormal gait   Rehab Potential Good   OT Frequency 2x / week   OT Duration 8 weeks   OT Treatment/Interventions Self-care/ADL training;Therapeutic exercise;Patient/family education;Balance training;Splinting;Manual Therapy;Neuromuscular education;Ultrasound;Therapeutic exercises;Therapeutic activities;DME and/or AE instruction;Parrafin;Fluidtherapy;Cognitive remediation/compensation;Passive range of motion;Moist Heat;Contrast Bath   Plan gripper for bilateral hands, progress bilateral UE functional use as able   OT Home Exercise Plan 08/17/15: coordination and putty HEP. 08/23/15: Cane HEP    Consulted and Agree with Plan of  Care Patient        Problem List Patient Active Problem List   Diagnosis Date Noted  . Generalized anxiety disorder 04/25/2015  . History of depression 04/25/2015  . Raynaud's disease 04/25/2015  . GERD (gastroesophageal reflux disease) 04/25/2015  . Neuropathy (HCC) 04/25/2015  . Neck pain 04/25/2015  . Atrial fibrillation (HCC) 10/14/2014  . Acute CVA (cerebrovascular accident) (HCC)   . Rash and nonspecific skin eruption   . Cerebral embolism with cerebral infarction (HCC)   . 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 Tobacco abuse   .  TIA (transient ischemic attack) 08/30/2014  . Hypercholesterolemia 08/30/2014    Kemontae Dunklee 08/27/2015, 1:57 PM Keene Breath, OTR/L [phone] Phone: [phone] 1:57 PM 08/27/2015 [facility] [address] [place], [place], [address] Phone: [phone]   Fax:  [phone]  Name: Dawn Escobar MRN: [id number] Date of Birth: Oct 31, 1960

## 2015-08-27 NOTE — Therapy (Signed)
[facility] [address] [place], [place], [address] Phone: [phone]   Fax:  [phone]  Physical Therapy Treatment  Patient Details  Name: Dawn Escobar MRN: [id number] Date of Birth: 17-Dec-1960 No Data Recorded  Encounter Date: 08/27/2015      PT End of Session - 08/27/15 1643    Visit Number 7   Number of Visits 17   Date for PT Re-Evaluation 09/29/15   Authorization Type MCR-G codes every 10th visit   Equipment Utilized During Treatment Gait belt   Activity Tolerance Patient limited by pain   Behavior During Therapy [facility]WFL for tasks assessed/performed      Past Medical History  Diagnosis Date  . PTSD (post-traumatic stress disorder)   . Raynaud's disease   . Anxiety   . Hypercholesteremia   . Stroke (HCC)   . GERD (gastroesophageal reflux disease) 04/25/2015  . Hereditary and idiopathic peripheral neuropathy 04/25/2015  . Hypertension   . Atrial fibrillation (HCC)   . Anxiety   . Migraine     Past Surgical History  Procedure Laterality Date  . Wrist surgery    . Abdominal hysterectomy    . Knee surgery    . Hip surgery  Right  . Tee without cardioversion N/A 09/05/2014    Procedure: TRANSESOPHAGEAL ECHOCARDIOGRAM (TEE);  Surgeon: Wendall StadePeter C Nishan, MD;  [facility];  Service: Cardiovascular;  Laterality: N/A;  loop after  . Loop recorder implant N/A 09/05/2014    MDT LINQ implanted by Dr Johney FrameAllred for cryptogenic stroke    There were no vitals filed for this visit.  Visit Diagnosis:  Abnormality of gait  Unsteadiness  Right-sided low back pain with right-sided sciatica      Subjective Assessment - 08/27/15 1152    Subjective "I'm still having pain in my L shoulder, I dont' have an appt with Dr. Pearlean BrownieSethi on 12/8."   Pertinent History CVA with right hemiparesis 08/30/14, and again on 06/01/15, anxiety, PTSD, Raynauds   Diagnostic tests MRI 06/01/15: Early subacute acute left MCA territory  cortical infarct    Currently in Pain? Yes   Pain Score 7    Pain Location Shoulder   Pain Orientation Left   Pain Descriptors / Indicators Sharp   Pain Type Chronic pain               Therex:  Pt educated on and performed back packet exercises for general core strengthening as well as LE stretching.  See pt handout for details on exercises.                    PT Education - 08/27/15 1323    Education provided Yes   Education Details Education on back packet exercises, performing 2-3 daily.     Person(s) Educated Patient   Methods Explanation;Handout;Demonstration   Comprehension Verbalized understanding;Returned demonstration          PT Short Term Goals - 08/23/15 1403    PT SHORT TERM GOAL #1   Title Pt will initiate HEP for strengthening and balance to decrease fall risk and improve functional mobility.  (Target Date: 08/28/15)   PT SHORT TERM GOAL #3   Title Pt will verbalize CVA warning signs independently to indicate decreased time to seek medical attention in case of future CVA.   (Target Date: 08/28/15)   Status Achieved   PT SHORT TERM GOAL #5   Baseline pt amb. 3" 19 secs for    prior to fatigue  requesting seated rest break           PT Long Term Goals - 07/31/15 1843    PT LONG TERM GOAL #1   Title Pt will be independent with HEP for strengthening and balance in order to indicate decreased fall risk and improved functional mobility. (Target Date: 09/25/15)   Time 8   Period Weeks   PT LONG TERM GOAL #2   Title Pt will increase R hip strength to 4/5 to indicate increased strength and improved functional mobility.   (Target Date: 09/25/15)   Time 8   Period Weeks   PT LONG TERM GOAL #3   Title Pt will initiate participation in community fitness program to continue to improve functional mobility beyond therapy.   (Target Date: 09/25/15)   Time 8   Period Weeks   PT LONG TERM GOAL #4   Title Pt will ambulate 500' without AD over  outdoor surfaces (paved, grass, curb, etc) at mod I level to indicate safe return to community.   (Target Date: 09/25/15)   Time 8   PT LONG TERM GOAL #5   Title Pt will increase by 150' to indicate increase in functional endurance.   (Target Date: 09/25/15)               Plan - 08/27/15 1154    Clinical Impression Statement Skilled session focused on back packet exercises to address low back pain and strenthen core, as well as LE stretching.  Tolerated well and provided for HEP.  Continue POC.    Pt will benefit from skilled therapeutic intervention in order to improve on the following deficits Abnormal gait;Decreased activity tolerance;Decreased balance;Decreased cognition;Decreased coordination;Decreased endurance;Decreased mobility;Decreased strength;Difficulty walking;Impaired perceived functional ability;Impaired sensation;Impaired UE functional use;Postural dysfunction;Improper body mechanics;Dizziness   Rehab Potential Good   PT Frequency 2x / week   PT Duration 8 weeks   PT Treatment/Interventions ADLs/Self Care Home Management;Electrical Stimulation;Moist Heat;Ultrasound;Gait training;Stair training;Functional mobility training;Therapeutic activities;Therapeutic exercise;Neuromuscular re-education;Balance training;Patient/family education;Manual techniques;Vestibular   PT Next Visit Plan RLE strengthening in closed chain and with resistance bands.  Attempt 6 minute walk test again (pt unable to complete on a previous session)   PT Home Exercise Plan Continue high level balance, RLE strengthening, work towards DC   [employer] and Agree with Plan of Care Patient        Problem List Patient Active Problem List   Diagnosis Date Noted  . Generalized anxiety disorder 04/25/2015  . History of depression 04/25/2015  . Raynaud's disease 04/25/2015  . GERD (gastroesophageal reflux disease) 04/25/2015  . Neuropathy (HCC) 04/25/2015  . Neck pain 04/25/2015  . Atrial  fibrillation (HCC) 10/14/2014  . Acute CVA (cerebrovascular accident) (HCC)   . Rash and nonspecific skin eruption   . Cerebral embolism with cerebral infarction (HCC)   . 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 Tobacco abuse   . TIA (transient ischemic attack) 08/30/2014  . Hypercholesterolemia 08/30/2014    Harriet Butte, PT, MPT [facility] [address] [place], [place], [address] Phone: [phone]   Fax:  [phone] 08/27/2015, 4:43 PM  Name: Dawn Escobar MRN: [id number] Date of Birth: [birth year]

## 2015-08-27 NOTE — Therapy (Signed)
[facility] [address] [place], [place], [address] Phone: [phone]   Fax:  [phone]  Physical Therapy Treatment  Patient Details  Name: Dawn Escobar MRN: [id number] Date of Birth: [birth year] No Data Recorded  Encounter Date: 08/23/2015      PT End of Session - 08/27/15 2024    Visit Number 8   Number of Visits 17   Date for PT Re-Evaluation 09/29/15   Authorization Type MCR-G codes every 10th visit   PT Start Time 1400   PT Stop Time 1445   PT Time Calculation (min) 45 min      Past Medical History  Diagnosis Date  . PTSD (post-traumatic stress disorder)   . Raynaud's disease   . Anxiety   . Hypercholesteremia   . Stroke (HCC)   . GERD (gastroesophageal reflux disease) 04/25/2015  . Hereditary and idiopathic peripheral neuropathy 04/25/2015  . Hypertension   . Atrial fibrillation (HCC)   . Anxiety   . Migraine     Past Surgical History  Procedure Laterality Date  . Wrist surgery    . Abdominal hysterectomy    . Knee surgery    . Hip surgery  Right  . Tee without cardioversion N/A 09/05/2014    Procedure: TRANSESOPHAGEAL ECHOCARDIOGRAM (TEE);  Surgeon: Wendall Stade, MD;  Location: [facility] ENDOSCOPY;  Service: Cardiovascular;  Laterality: N/A;  loop after  . 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  Visit Diagnosis:  Abnormality of gait  Hemiparesis affecting right side as late effect of stroke [facility])      Subjective Assessment - 08/27/15 1152    Subjective "I'm still having pain in my L shoulder, I dont' have an appt with Dr. Pearlean Brownie on 12/8."   Pertinent History CVA with right hemiparesis 08/30/14, and again on 06/01/15, anxiety, PTSD, Raynauds   Diagnostic tests MRI 06/01/15: Early subacute acute left MCA territory cortical infarct    Currently in Pain? Yes   Pain Score 7    Pain Location  Shoulder   Pain Orientation Left   Pain Descriptors / Indicators Sharp   Pain Type Chronic pain          Neuro Re-ed:  Pt performed standing balance exercises inside parallel bars - forward, backward, and side kicks x 10 reps each with UE support; heel raises x 10 reps with UE support R hip abduction in standing - attempted use of theraband for strengthening but pt unable to initiate movement; moved to parallel bars so pt could hold both bars for support and pt able to perform hip abduction with resistance with moderate difficulty Sidestepping inside bars 10' x 4 reps with UE support prn  Marching in place x 10 reps each leg with UE support Rockerboard with UE assist  Tap ups alternating with UE support to 6" step x 10 reps each leg; Stepping over and back of bolster with RLE/LLE x 5 reps each leg with UE support                      PT Education - 08/27/15 1323    Education provided Yes   Education Details Education on back packet exercises, performing 2-3 daily.     Person(s) Educated Patient   Methods Explanation;Handout;Demonstration   Comprehension Verbalized understanding;Returned demonstration          PT  Short Term Goals - 08/23/15 1403    PT SHORT TERM GOAL #1   Title Pt will initiate HEP for strengthening and balance to decrease fall risk and improve functional mobility.  (Target Date: 08/28/15)   PT SHORT TERM GOAL #3   Title Pt will verbalize CVA warning signs independently to indicate decreased time to seek medical attention in case of future CVA.   (Target Date: 08/28/15)   Status Achieved   PT SHORT TERM GOAL #5   Baseline pt amb. 3" 19 secs for    prior to fatigue requesting seated rest break           PT Long Term Goals - 07/31/15 1843    PT LONG TERM GOAL #1   Title Pt will be independent with HEP for strengthening and balance in order to indicate decreased fall risk and improved functional mobility. (Target Date: 09/25/15)   Time 8    Period Weeks   PT LONG TERM GOAL #2   Title Pt will increase R hip strength to 4/5 to indicate increased strength and improved functional mobility.   (Target Date: 09/25/15)   Time 8   Period Weeks   PT LONG TERM GOAL #3   Title Pt will initiate participation in community fitness program to continue to improve functional mobility beyond therapy.   (Target Date: 09/25/15)   Time 8   Period Weeks   PT LONG TERM GOAL #4   Title Pt will ambulate 500' without AD over outdoor surfaces (paved, grass, curb, etc) at mod I level to indicate safe return to community.   (Target Date: 09/25/15)   Time 8   PT LONG TERM GOAL #5   Title Pt will increase by 150' to indicate increase in functional endurance.   (Target Date: 09/25/15)               Plan - 08/27/15 1154    Clinical Impression Statement Skilled session focused on back packet exercises to address low back pain and strenthen core, as well as LE stretching.  Tolerated well and provided for HEP.  Continue POC.    Pt will benefit from skilled therapeutic intervention in order to improve on the following deficits Abnormal gait;Decreased activity tolerance;Decreased balance;Decreased cognition;Decreased coordination;Decreased endurance;Decreased mobility;Decreased strength;Difficulty walking;Impaired perceived functional ability;Impaired sensation;Impaired UE functional use;Postural dysfunction;Improper body mechanics;Dizziness   Rehab Potential Good   PT Frequency 2x / week   PT Duration 8 weeks   PT Treatment/Interventions ADLs/Self Care Home Management;Electrical Stimulation;Moist Heat;Ultrasound;Gait training;Stair training;Functional mobility training;Therapeutic activities;Therapeutic exercise;Neuromuscular re-education;Balance training;Patient/family education;Manual techniques;Vestibular   PT Next Visit Plan RLE strengthening in closed chain and with resistance bands.  Attempt 6 minute walk test again (pt unable to complete on a  previous session)   PT Home Exercise Plan Continue high level balance, RLE strengthening, work towards DC   [employer] and Agree with Plan of Care Patient        Problem List Patient Active Problem List   Diagnosis Date Noted  . Generalized anxiety disorder 04/25/2015  . History of depression 04/25/2015  . Raynaud's disease 04/25/2015  . GERD (gastroesophageal reflux disease) 04/25/2015  . Neuropathy (HCC) 04/25/2015  . Neck pain 04/25/2015  . Atrial fibrillation (HCC) 10/14/2014  . Acute CVA (cerebrovascular accident) (HCC)   . Rash and nonspecific skin eruption   . Cerebral embolism with cerebral infarction (HCC)   . 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 Tobacco abuse   .  TIA (transient ischemic attack) 08/30/2014  . Hypercholesterolemia 08/30/2014    Kary Kosilday, Armine Rizzolo Suzanne, PT 08/27/2015, 8:40 PM  [facility] [address] [place], [place], [address] Phone: [phone]   Fax:  [phone]  Name: Dawn [name] MRN: [id number] Date of Birth: 10-24-1960

## 2015-08-28 ENCOUNTER — Ambulatory Visit: Payer: Medicare Other | Admitting: Rehabilitation

## 2015-08-29 ENCOUNTER — Ambulatory Visit (INDEPENDENT_AMBULATORY_CARE_PROVIDER_SITE_OTHER): Payer: Medicare Other | Admitting: Medical

## 2015-08-29 ENCOUNTER — Ambulatory Visit (HOSPITAL_BASED_OUTPATIENT_CLINIC_OR_DEPARTMENT_OTHER)
Admission: RE | Admit: 2015-08-29 | Discharge: 2015-08-29 | Disposition: A | Discharge status: Home / Self Care | Payer: Medicare Other | Source: Ambulatory Visit | Attending: Medical | Admitting: Medical

## 2015-08-29 ENCOUNTER — Ambulatory Visit: Payer: Medicare Other | Admitting: Medical

## 2015-08-29 ENCOUNTER — Encounter: Payer: Self-pay | Admitting: Medical

## 2015-08-29 ENCOUNTER — Telehealth: Payer: Self-pay | Admitting: Medical

## 2015-08-29 VITALS — BP 122/78 | HR 76 | Temp 98.4°F | Ht 65.0 in | Wt 190.0 lb

## 2015-08-29 DIAGNOSIS — R05 Cough: Secondary | ICD-10-CM

## 2015-08-29 DIAGNOSIS — K140 Glossitis: Secondary | ICD-10-CM | POA: Diagnosis not present

## 2015-08-29 DIAGNOSIS — I6340 Cerebral infarction due to embolism of unspecified cerebral artery: Secondary | ICD-10-CM | POA: Diagnosis not present

## 2015-08-29 DIAGNOSIS — R059 Cough, unspecified: Secondary | ICD-10-CM

## 2015-08-29 DIAGNOSIS — R5383 Other fatigue: Secondary | ICD-10-CM | POA: Diagnosis not present

## 2015-08-29 DIAGNOSIS — R509 Fever, unspecified: Secondary | ICD-10-CM | POA: Diagnosis not present

## 2015-08-29 DIAGNOSIS — J208 Acute bronchitis due to other specified organisms: Secondary | ICD-10-CM

## 2015-08-29 DIAGNOSIS — R0989 Other specified symptoms and signs involving the circulatory and respiratory systems: Secondary | ICD-10-CM | POA: Insufficient documentation

## 2015-08-29 DIAGNOSIS — M791 Myalgia, unspecified site: Secondary | ICD-10-CM

## 2015-08-29 LAB — CBC WITH DIFFERENTIAL/PLATELET
BASOS PCT: 0.4 % (ref 0.0–3.0)
Basophils Absolute: 0 10*3/uL (ref 0.0–0.1)
EOS ABS: 0.2 10*3/uL (ref 0.0–0.7)
Eosinophils Relative: 1.9 % (ref 0.0–5.0)
HCT: 40.1 % (ref 36.0–46.0)
HEMOGLOBIN: 13.3 g/dL (ref 12.0–15.0)
LYMPHS ABS: 1.7 10*3/uL (ref 0.7–4.0)
Lymphocytes Relative: 20 % (ref 12.0–46.0)
MCHC: 33.1 g/dL (ref 30.0–36.0)
MCV: 92.7 fl (ref 78.0–100.0)
MONO ABS: 0.5 10*3/uL (ref 0.1–1.0)
Monocytes Relative: 6.4 % (ref 3.0–12.0)
NEUTROS PCT: 71.3 % (ref 43.0–77.0)
Neutro Abs: 5.9 10*3/uL (ref 1.4–7.7)
Platelets: 180 10*3/uL (ref 150.0–400.0)
RBC: 4.32 Mil/uL (ref 3.87–5.11)
RDW: 14.4 % (ref 11.5–15.5)
WBC: 8.3 10*3/uL (ref 4.0–10.5)

## 2015-08-29 LAB — POCT INFLUENZA A/B: Influenza A, POC: NEGATIVE

## 2015-08-29 LAB — VITAMIN B12: VITAMIN B 12: 297 pg/mL (ref 211–911)

## 2015-08-29 MED ORDER — AZITHROMYCIN 250 MG PO TABS: ORAL_TABLET | ORAL | Status: DC

## 2015-08-29 MED ORDER — CLONAZEPAM 0.5 MG PO TABS: 0.5000 mg | ORAL_TABLET | Freq: Two times a day (BID) | ORAL | Status: DC | PRN

## 2015-08-29 MED ORDER — GABAPENTIN 100 MG PO CAPS: ORAL_CAPSULE | ORAL | Status: DC

## 2015-08-29 MED ORDER — BENZONATATE 100 MG PO CAPS: 100.0000 mg | ORAL_CAPSULE | Freq: Three times a day (TID) | ORAL | Status: DC | PRN

## 2015-08-29 MED ORDER — METHOCARBAMOL 500 MG PO TABS: 500.0000 mg | ORAL_TABLET | Freq: Every evening | ORAL | Status: DC | PRN

## 2015-08-29 NOTE — Progress Notes (Signed)
Pre visit review using our clinic review tool, if applicable. No additional management support is needed unless otherwise documented below in the visit note. 

## 2015-08-29 NOTE — Telephone Encounter (Signed)
°  Relation to ZO:XWRUpt:self Call back number:[phone] Pharmacy:  Reason for call: pt would like for you to call her with her results, states she is having trouble with her my chart xray results and labs

## 2015-08-29 NOTE — Patient Instructions (Addendum)
I will rx azithromycin to treat for bronchitis in light of your symptoms and upcoming 4 day holiday. Will get cbc to assess wbc and cxr to evaluate if any pneumonia. Benzonatate for cough.  For your fatigue and your concern for low b12 will go ahead and get level today.  We did flu test due to your myalgias. The test was negative. In addition 3 days into muscle soreness. Even if  false negative you would have passed ideal treatment time frame. You can use otc nsaid ibuprofen or alleve for  Soreness.  Follow up in 5 days or as needed(at least give us update on how your are early next week) Worse or changing symptoms over weekend then ED evaluation.

## 2015-08-29 NOTE — Progress Notes (Signed)
Subjective:    Patient ID: Dawn Escobar, female    DOB: 1961-09-22, 54 y.o.   MRN: [id number]  HPI  Pt in today feeling tired. Pt states feeling bad for 2 wks(mostly nasal congestion and mild fatigue). But  last 3 days a lot worse getting sweats and chills with diffuse body aches(joint and muscle pain) all over. Pt is coughing a lot. Some productive cough. Some mucous production when she blows her nose some mucous.   About one month ago pt had some GI symptoms but that is now resolved. I rx'd cipro and flagyl.  Pt also states some fatigue as well. She thinks cipro may have dropped her b12 level after reviewing side effects on package insert.    Review of Systems  Constitutional: Positive for fever, chills, diaphoresis and fatigue.  HENT: Positive for congestion. Negative for ear pain, nosebleeds, postnasal drip, rhinorrhea, sinus pressure, sneezing and sore throat.   Respiratory: Positive for cough. Negative for chest tightness and wheezing.   Cardiovascular: Negative for chest pain and palpitations.  Gastrointestinal: Negative.   Musculoskeletal: Negative for back pain.  Neurological: Negative for dizziness and headaches.  Hematological: Negative for adenopathy. Does not bruise/bleed easily.    Past Medical History  Diagnosis Date  . PTSD (post-traumatic stress disorder)   . Raynaud's disease   . Anxiety   . Hypercholesteremia   . Stroke (HCC)   . GERD (gastroesophageal reflux disease) 04/25/2015  . Hereditary and idiopathic peripheral neuropathy 04/25/2015  . Hypertension   . Atrial fibrillation (HCC)   . Anxiety   . Migraine     Social History   Social History  . Marital Status: Single    Spouse Name: N/A  . Number of Children: 0  . Years of Education: 12th   Occupational History  . n/a    Social History Main Topics  . Smoking status: Former Smoker -- 0.20 packs/day    Types: Cigarettes  . Smokeless tobacco: Never Used  . Alcohol Use: 0.0 oz/week    0  Standard drinks or equivalent per week  . Drug Use: No  . Sexual Activity: Not on file   Other Topics Concern  . Not on file   Social History Narrative   Patient lives at home alone.   Caffeine use: 1 cup of coffee and 20 oz of soda    Past Surgical History  Procedure Laterality Date  . Wrist surgery    . Abdominal hysterectomy    . Knee surgery    . Hip surgery  Right  . Tee without cardioversion N/A 09/05/2014    Procedure: TRANSESOPHAGEAL ECHOCARDIOGRAM (TEE);  Surgeon: Wendall Stade, MD;  Location: [facility] ENDOSCOPY;  Service: Cardiovascular;  Laterality: N/A;  loop after  . 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 Colon cancer Brother   . Heart disease Mother   . Heart attack Father     Allergies  Allergen Reactions  . Morphine And Related Hives  . Tape Rash    No tape at all    Current Outpatient Prescriptions on File Prior to Visit  Medication Sig Dispense Refill  . acetaminophen (TYLENOL) 500 MG tablet Take 500 mg by mouth every 6 (six) hours as needed for mild pain, moderate pain or headache.    Marland Kitchen amitriptyline (ELAVIL) 25 MG tablet Take 1 tablet (25 mg total) by mouth  daily. (Patient taking differently: Take 25 mg by mouth at bedtime. ) 90 tablet 3  . AMLODIPINE & DIET MANAGE PROD PO Take by mouth.    Marland Kitchen. apixaban (ELIQUIS) 5 MG TABS tablet Take 1 tablet (5 mg total) by mouth 2 (two) times daily. 60 tablet 11  . clotrimazole-betamethasone (LOTRISONE) cream Apply 1 application topically 2 (two) times daily. 30 g 0  . doxycycline (VIBRA-TABS) 100 MG tablet Take 1 tablet (100 mg total) by mouth 2 (two) times daily. 20 tablet 0  . fluticasone (FLONASE) 50 MCG/ACT nasal spray Place 2 sprays into both nostrils daily. 16 g 1  . NEXIUM 40 MG capsule TAKE 1 CAPSULE (40 MG TOTAL) BY MOUTH DAILY. 30 capsule 2  . omeprazole (PRILOSEC) 20 MG capsule Take 1 capsule (20 mg  total) by mouth daily. 30 capsule 1  . rosuvastatin (CRESTOR) 40 MG tablet Take 1 tablet (40 mg total) by mouth daily. 30 tablet 5  . sertraline (ZOLOFT) 50 MG tablet Take 1 tablet (50 mg total) by mouth daily. 30 tablet 3  . traZODone (DESYREL) 100 MG tablet TAKE 1 TABLET (100 MG TOTAL) BY MOUTH AT BEDTIME. 30 tablet 1  . verapamil (CALAN-SR) 120 MG CR tablet Take 1 tablet (120 mg total) by mouth daily. 30 tablet 5   No current facility-administered medications on file prior to visit.    BP 122/78 mmHg  Pulse 76  Temp(Src) 98.4 F (36.9 C) (Oral)  Ht 5\' 5"  (1.651 m)  Wt 190 lb (86.183 kg)  BMI 31.62 kg/m2  SpO2 98%       Objective:   Physical Exam  General  Mental Status - Alert. General Appearance - Well groomed. Not in acute distress. Sounds nasal congested.  Skin Rashes- No Rashes.  HEENT Head- Normal. Ear Auditory Canal - Left- Normal. Right - Normal.Tympanic Membrane- Left- Normal. Right- Normal. Eye Sclera/Conjunctiva- Left- Normal. Right- Normal. Nose & Sinuses Nasal Mucosa- Left-  Not oggy or Congested. Right-  Not  boggy or Congested. Mouth & Throat Lips: Upper Lip- Normal: no dryness, cracking, pallor, cyanosis, or vesicular eruption. Lower Lip-Normal: no dryness, cracking, pallor, cyanosis or vesicular eruption. Buccal Mucosa- Bilateral- No Aphthous ulcers. Oropharynx- No Discharge or Erythema. Tonsils: Characteristics- Bilateral- No Erythema or Congestion. Size/Enlargement- Bilateral- No enlargement. Discharge- bilateral-None.  Neck Neck- Supple. No Masses.   Chest and Lung Exam Auscultation: Breath Sounds:- even and unlabored, but bilateral upper lobe rhonchi.  Cardiovascular Auscultation:Rythm- Regular, rate and rhythm. Murmurs & Other Heart Sounds:Ausculatation of the heart reveal- No Murmurs.  Lymphatic Head & Neck General Head & Neck Lymphatics: Bilateral: Description- No Localized lymphadenopathy.       Assessment & Plan:  I will rx  azithromycin to treat for bronchitis in light of your symptoms and upcoming 4 day holiday. Will get cbc to assess wbc and cxr to evaluate if any pneumonia. Benzonatate for cough.  For your fatigue and your concern for low b12 will go ahead and get level today.  We did flu test due to your myalgias. The test was negative. In addition 3 days into muscle soreness. Even if  false negative you would have passed ideal treatment time frame. You can use otc nsaid ibuprofen or alleve for  Soreness.  Follow up in 5 days or as needed(at least give us update on how your are early next week) Worse or changing symptoms over weekend then ED evaluation.

## 2015-09-03 ENCOUNTER — Ambulatory Visit: Payer: Medicare Other | Admitting: Rehabilitation

## 2015-09-03 ENCOUNTER — Ambulatory Visit: Payer: Medicare Other | Admitting: Occupational Therapy

## 2015-09-03 ENCOUNTER — Encounter: Payer: Self-pay | Admitting: Occupational Therapy

## 2015-09-03 VITALS — BP 148/76 | HR 72

## 2015-09-03 DIAGNOSIS — R29898 Other symptoms and signs involving the musculoskeletal system: Secondary | ICD-10-CM | POA: Diagnosis not present

## 2015-09-03 DIAGNOSIS — R2681 Unsteadiness on feet: Secondary | ICD-10-CM | POA: Diagnosis not present

## 2015-09-03 DIAGNOSIS — I69351 Hemiplegia and hemiparesis following cerebral infarction affecting right dominant side: Secondary | ICD-10-CM

## 2015-09-03 DIAGNOSIS — R4701 Aphasia: Secondary | ICD-10-CM | POA: Diagnosis not present

## 2015-09-03 DIAGNOSIS — R279 Unspecified lack of coordination: Secondary | ICD-10-CM | POA: Diagnosis not present

## 2015-09-03 DIAGNOSIS — R269 Unspecified abnormalities of gait and mobility: Secondary | ICD-10-CM | POA: Diagnosis not present

## 2015-09-03 DIAGNOSIS — R278 Other lack of coordination: Secondary | ICD-10-CM

## 2015-09-03 NOTE — Therapy (Signed)
[facility] [address] [place], [place], [address] Phone: [phone]   Fax:  [phone]  Physical Therapy Treatment  Patient Details  Name: Dawn Escobar MRN: [id number] Date of Birth: [birth year] No Data Recorded  Encounter Date: 09/03/2015      PT End of Session - 09/03/15 1155    Visit Number 9   Number of Visits 17   Date for PT Re-Evaluation 09/29/15   Authorization Type MCR-G codes every 10th visit   PT Start Time 1151  pt late for appt   PT Stop Time 1232   PT Time Calculation (min) 41 min   Activity Tolerance Patient tolerated treatment well   Behavior During Therapy [facility] for tasks assessed/performed      Past Medical History  Diagnosis Date  . PTSD (post-traumatic stress disorder)   . Raynaud's disease   . Anxiety   . Hypercholesteremia   . Stroke ([place])   . GERD (gastroesophageal reflux disease) 04/25/2015  . Hereditary and idiopathic peripheral neuropathy 04/25/2015  . Hypertension   . Atrial fibrillation ([place])   . Anxiety   . Migraine     Past Surgical History  Procedure Laterality Date  . Wrist surgery    . Abdominal hysterectomy    . Knee surgery    . Hip surgery  Right  . Tee without cardioversion N/A 09/05/2014    Procedure: TRANSESOPHAGEAL ECHOCARDIOGRAM (TEE);  Surgeon: Josue Hector, MD;  Location: [facility] ENDOSCOPY;  Service: Cardiovascular;  Laterality: N/A;  loop after  . Loop recorder implant N/A 09/05/2014    MDT LINQ implanted by Dr Rayann Heman for cryptogenic stroke    Filed Vitals:   09/03/15 1228  BP: 148/76  Pulse: 72    Visit Diagnosis:  Abnormality of gait  Hemiparesis affecting right side as late effect of stroke ([place])  Unsteadiness      Subjective Assessment - 09/03/15 1153    Subjective "I went to the doctor and he put my on some antibiotics.  I'm feeling a little better."     Pertinent History CVA with right hemiparesis 08/30/14, and again on 06/01/15, anxiety,  PTSD, Raynauds   Diagnostic tests MRI 06/01/15: Early subacute acute left MCA territory cortical infarct    Currently in Pain? Yes   Pain Score 4    Pain Location Shoulder   Pain Orientation Left   Pain Descriptors / Indicators Sharp   Pain Type Chronic pain   Pain Radiating Towards down L arm   Pain Onset More than a month ago   Pain Frequency Constant   Aggravating Factors  use   Pain Relieving Factors taping, rest             Therex:  Went over remaining back packet for HEP.  Performed seated pelvic tilts x 10 reps, wall squats x 10 reps with 5 sec hold, standing calf stretch x BLE 60 secs each, and standing back extension x 10 reps with 3 second hold.   6MWT with distance of 1177', indicative of improvement in back pain and functional endurance as she was unable to complete 6MWT during last visit.  Pt verbalized understanding.    Self Care:  Discussed importance of compliance with HEP.  Pt stating that she does exercises on days that she is not in therapy and has noted that back pain does not increase but remains the same.  She was also able to state CVA warning signs and symptoms during session with very little cues  from therapist.  Cues only provided for cognitive deficits that can occur with CVA.  Pt verbalized understanding.    NMR:  Performed DGI with new score of 19/24, improvement of only single point since eval.  Pt still at risk for fall, but has made small gains towards goals.  Educated pt on meaning of balance test and goals to improve score more than 19 to demonstrate decreased fall risk.  Pt verbalized understanding.             [facility] Adult PT Treatment/Exercise - 09/03/15 1220    Standardized Balance Assessment   Standardized Balance Assessment Dynamic Gait Index   Dynamic Gait Index   Level Surface Normal   Change in Gait Speed Normal   Gait with Horizontal Head Turns Moderate Impairment   Gait with Vertical Head Turns Mild Impairment   Gait and Pivot  Turn Normal   Step Over Obstacle Mild Impairment   Step Around Obstacles Normal   Steps Mild Impairment   Total Score 19                PT Education - 09/03/15 1253    Education provided Yes   Education Details importance on compliance with HEP   Person(s) Educated Patient   Methods Explanation   Comprehension Verbalized understanding          PT Short Term Goals - 09/03/15 1155    PT SHORT TERM GOAL #1   Title Pt will initiate HEP for strengthening and balance to decrease fall risk and improve functional mobility.  (Target Date: 08/28/15)   Baseline met 09/03/15   Status Achieved   PT SHORT TERM GOAL #2   Title Pt will increase DGI to 22/24 in order to indicate increase in functional balance and decreased fall risk.    (Target Date: 08/28/15)   Baseline 19/24 on 09/03/15   Status Not Met   PT SHORT TERM GOAL #3   Title Pt will verbalize CVA warning signs independently to indicate decreased time to seek medical attention in case of future CVA.   (Target Date: 08/28/15)   Baseline met 09/03/15   Status Achieved   PT SHORT TERM GOAL #4   Title Pt will report no more than 2 point increase in back pain during therapy session to indicate that pain is decreased limiting factor in mobility.   (Target Date: 08/28/15)   Baseline reports that pain stays the same (about 5-6) 09/03/15   Status Achieved   PT SHORT TERM GOAL #5   Title Will assess 6MWT and increase by 150' in order to indicate increase in fucntional endurance.    (Target Date: 08/28/15)   Baseline 1177' on 09/03/15, demonstrating improved functional endurance as pt was unable to complete during last visit.     Status Achieved           PT Long Term Goals - 07/31/15 1843    PT LONG TERM GOAL #1   Title Pt will be independent with HEP for strengthening and balance in order to indicate decreased fall risk and improved functional mobility. (Target Date: 09/25/15)   Time 8   Period Weeks   PT LONG TERM GOAL #2    Title Pt will increase R hip strength to 4/5 to indicate increased strength and improved functional mobility.   (Target Date: 09/25/15)   Time 8   Period Weeks   PT LONG TERM GOAL #3   Title Pt will initiate participation in community fitness program to  continue to improve functional mobility beyond therapy.   (Target Date: 09/25/15)   Time 8   Period Weeks   PT LONG TERM GOAL #4   Title Pt will ambulate 500' without AD over outdoor surfaces (paved, grass, curb, etc) at mod I level to indicate safe return to community.   (Target Date: 09/25/15)   Time 8   PT LONG TERM GOAL #5   Title Pt will increase 6MWT by 150' to indicate increase in functional endurance.   (Target Date: 09/25/15)               Plan - 09/03/15 1155    Clinical Impression Statement Skilled session focused on addressing STG's along with finishing back packet exercises.  Pt has met 4/5 STG's during first 4 weeks of treatment.  Note that she continues to have 19/24 on DGI, indicative of fall risk, therefore would add balance to HEP in next visits.     Pt will benefit from skilled therapeutic intervention in order to improve on the following deficits Abnormal gait;Decreased activity tolerance;Decreased balance;Decreased cognition;Decreased coordination;Decreased endurance;Decreased mobility;Decreased strength;Difficulty walking;Impaired perceived functional ability;Impaired sensation;Impaired UE functional use;Postural dysfunction;Improper body mechanics;Dizziness   Rehab Potential Good   PT Frequency 2x / week   PT Duration 8 weeks   PT Treatment/Interventions ADLs/Self Care Home Management;Electrical Stimulation;Moist Heat;Ultrasound;Gait training;Stair training;Functional mobility training;Therapeutic activities;Therapeutic exercise;Neuromuscular re-education;Balance training;Patient/family education;Manual techniques;Vestibular   PT Next Visit Plan RLE strengthening in closed chain and with resistance bands. Add  balance to HEP   PT Home Exercise Plan --   Consulted and Agree with Plan of Care Patient        Problem List Patient Active Problem List   Diagnosis Date Noted  . Generalized anxiety disorder 04/25/2015  . History of depression 04/25/2015  . Raynaud's disease 04/25/2015  . GERD (gastroesophageal reflux disease) 04/25/2015  . Neuropathy ([place]) 04/25/2015  . Neck pain 04/25/2015  . Atrial fibrillation ([place]) 10/14/2014  . Acute CVA (cerebrovascular accident) ([place])   . Rash and nonspecific skin eruption   . Cerebral embolism with cerebral infarction ([place])   . 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 Tobacco abuse   . TIA (transient ischemic attack) 08/30/2014  . Hypercholesterolemia 08/30/2014    Cameron Sprang, PT, MPT [facility] [address] [place], [place], [address] Phone: [phone]   Fax:  [phone] 09/03/2015, 12:56 PM  Name: Dawn Escobar MRN: [id number] Date of Birth: 04-30-1961

## 2015-09-03 NOTE — Telephone Encounter (Signed)
Left message for pt to call back if she has any questions. 

## 2015-09-03 NOTE — Therapy (Signed)
[facility] [address] [place], [place], [address] Phone: [phone]   Fax:  [phone]  Occupational Therapy Treatment  Patient Details  Name: Dawn Escobar MRN: [id number] Date of Birth: [birth year] Referring Provider: Dr. Pearlean Brownie  Encounter Date: 09/03/2015      OT End of Session - 09/03/15 1442    Visit Number 6   Number of Visits 17   Date for OT Re-Evaluation 10/11/14   Authorization Type Medicare   Authorization - Visit Number 7   Authorization - Number of Visits 10   OT Start Time 1232   OT Stop Time 1310   OT Time Calculation (min) 38 min   Equipment Utilized During Treatment UE ranger   Activity Tolerance Patient tolerated treatment well   Behavior During Therapy [facility] for tasks assessed/performed      Past Medical History  Diagnosis Date  . PTSD (post-traumatic stress disorder)   . Raynaud's disease   . Anxiety   . Hypercholesteremia   . Stroke ([facility])   . GERD (gastroesophageal reflux disease) 04/25/2015  . Hereditary and idiopathic peripheral neuropathy 04/25/2015  . Hypertension   . Atrial fibrillation ([facility])   . Anxiety   . Migraine     Past Surgical History  Procedure Laterality Date  . Wrist surgery    . Abdominal hysterectomy    . Knee surgery    . Hip surgery  Right  . Tee without cardioversion N/A 09/05/2014    Procedure: TRANSESOPHAGEAL ECHOCARDIOGRAM (TEE);  Surgeon: Wendall Stade, MD;  Location: [facility] ENDOSCOPY;  Service: Cardiovascular;  Laterality: N/A;  loop after  . 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  Visit Diagnosis:  Hemiparesis affecting right side as late effect of stroke ([facility])  Right arm weakness  Decreased coordination      Subjective Assessment - 09/03/15 1242    Subjective  left shoulder - only about a 5 (pain)   Pertinent History see Epic snapshot     Patient Stated Goals reduce shoulder pain    Currently in Pain? Yes   Pain Score 4    Pain Location Shoulder   Pain Orientation Left   Pain Descriptors / Indicators Sharp;Tingling   Pain Type Chronic pain   Pain Radiating Towards shoulder to fingers   Pain Onset More than a month ago   Pain Frequency Constant   Aggravating Factors  use, positioning   Pain Relieving Factors taping, rest                      OT Treatments/Exercises (OP) - 09/03/15 0001    Neurological Re-education Exercises   Other Exercises 1 Patient with decreased ability to grade motion in proximal right upper extremity resulting in ratcheting quality to movements of shoulder and elbow and forearm.  Worked in closed chain and modified closed chain to reduce affect of gravity and improve coordination and muscle balance in shoulder and elbow.  Followed in sitting in modified closed chain (UE ranger to incorporate vision to help grade muscle control.  Patient with significant sensory deficit and has difficulty adjusting muscle force.  Patient with best response in mid ranges - more challenging toward end range.                  OT Education - 09/03/15 1441    Education provided Yes  Education Details graded control, muscle balance in right shoulder and elbow   Person(s) Educated Patient   Methods Explanation   Comprehension Need further instruction          OT Short Term Goals - 09/03/15 1444    OT SHORT TERM GOAL #1   Title I with HEP.   Status On-going   OT SHORT TERM GOAL #2   Title Pt will verbalize understanding of positioning,and strategies to minimize LUE pain   Status On-going   OT SHORT TERM GOAL #3   Title Pt will improve RUE grip strength to 22 lbs for increased functional use   Status On-going   OT SHORT TERM GOAL #4   Title Pt will demonstrate improved fine motor coordination as evidenced by decreasing RUE 9 hole peg test score to 45 secs or less.   Status On-going            OT Long Term Goals - 09/03/15 1444    OT LONG TERM GOAL #1   Title Pt will report ability to use LUE as dominant hand for ADLS/ IADLS 75% of the time with pain less than or equal to 5/10.   Status On-going   OT LONG TERM GOAL #2   Title Pt will increase LUE grip strength to 48lbs or greater for increased functional use.   Status On-going   OT LONG TERM GOAL #3   Title Pt will demonstrate improved fine motor coordination as evidenced by decreasing RUE 9 hole peg test score to 40 secs or less.    Status On-going   OT LONG TERM GOAL #4   Title Pt will demonstrate adequate RUE strength to retrieve 3 lbs from overhead shelf x3 reps without dropping.   Status On-going   OT LONG TERM GOAL #5   Title Pt will demonstrate ability to perform basic home managment/ cooking task modified independently demonstrating good safety awareness.   Status On-going               Plan - 09/03/15 1442    Clinical Impression Statement Patient is showing improvement in right upper extremity function, and reports improvement in left UE pain since taping.     Pt will benefit from skilled therapeutic intervention in order to improve on the following deficits (Retired) Decreased strength;Decreased mobility;Decreased cognition;Pain;Impaired UE functional use;Decreased balance;Decreased knowledge of use of DME;Decreased knowledge of precautions;Decreased activity tolerance;Impaired sensation;Decreased safety awareness;Decreased endurance;Impaired flexibility;Decreased range of motion;Decreased coordination;Abnormal gait   Rehab Potential Good   OT Frequency 2x / week   OT Duration 8 weeks   OT Treatment/Interventions Self-care/ADL training;Therapeutic exercise;Patient/family education;Balance training;Splinting;Manual Therapy;Neuromuscular education;Ultrasound;Therapeutic exercises;Therapeutic activities;DME and/or AE instruction;Parrafin;Fluidtherapy;Cognitive remediation/compensation;Passive range of  motion;Moist Heat;Contrast Bath   Plan NMR right UE, gripper for bilateral hands, improve UE function as able   OT Home Exercise Plan 08/17/15: coordination and putty HEP. 08/23/15: Cane HEP    Consulted and Agree with Plan of Care Patient        Problem List Patient Active Problem List   Diagnosis Date Noted  . Generalized anxiety disorder 04/25/2015  . History of depression 04/25/2015  . Raynaud's disease 04/25/2015  . GERD (gastroesophageal reflux disease) 04/25/2015  . Neuropathy ([facility]) 04/25/2015  . Neck pain 04/25/2015  . Atrial fibrillation ([facility]) 10/14/2014  . Acute CVA (cerebrovascular accident) ([facility])   . Rash and nonspecific skin eruption   . Cerebral embolism with cerebral infarction ([facility])   . 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  Tobacco abuse   . TIA (transient ischemic attack) 08/30/2014  . Hypercholesterolemia 08/30/2014    Collier SalinaGellert, Kristin M, OTR/L 09/03/2015, 2:45 PM  [facility] [address] [place], [place], [address] Phone: [phone]   Fax:  [phone]  Name: Philipp OvensDenise Tokarz MRN: [id number] Date of Birth: Jul 06, 1961

## 2015-09-04 ENCOUNTER — Telehealth: Payer: Self-pay | Admitting: Medical

## 2015-09-04 ENCOUNTER — Encounter: Payer: Medicare Other | Admitting: Medical

## 2015-09-04 NOTE — Progress Notes (Signed)
This encounter was created in error - please disregard.

## 2015-09-05 ENCOUNTER — Ambulatory Visit: Payer: Medicare Other | Admitting: Rehabilitation

## 2015-09-05 ENCOUNTER — Ambulatory Visit: Payer: Medicare Other | Admitting: Occupational Therapy

## 2015-09-05 ENCOUNTER — Encounter: Payer: Self-pay | Admitting: Rehabilitation

## 2015-09-05 DIAGNOSIS — I69351 Hemiplegia and hemiparesis following cerebral infarction affecting right dominant side: Secondary | ICD-10-CM

## 2015-09-05 DIAGNOSIS — R269 Unspecified abnormalities of gait and mobility: Secondary | ICD-10-CM | POA: Diagnosis not present

## 2015-09-05 DIAGNOSIS — R2681 Unsteadiness on feet: Secondary | ICD-10-CM | POA: Diagnosis not present

## 2015-09-05 DIAGNOSIS — R279 Unspecified lack of coordination: Secondary | ICD-10-CM | POA: Diagnosis not present

## 2015-09-05 DIAGNOSIS — R29898 Other symptoms and signs involving the musculoskeletal system: Secondary | ICD-10-CM | POA: Diagnosis not present

## 2015-09-05 DIAGNOSIS — R4701 Aphasia: Secondary | ICD-10-CM | POA: Diagnosis not present

## 2015-09-05 DIAGNOSIS — R278 Other lack of coordination: Secondary | ICD-10-CM

## 2015-09-05 DIAGNOSIS — M5441 Lumbago with sciatica, right side: Secondary | ICD-10-CM

## 2015-09-05 NOTE — Therapy (Signed)
[facility] [address] [place], [place], [address] Phone: [phone]   Fax:  [phone]  Occupational Therapy Treatment  Patient Details  Name: Dawn Escobar MRN: [id number] Date of Birth: 01-Apr-1961 Referring Provider: Dr. Leonie Man  Encounter Date: 09/05/2015      OT End of Session - 09/05/15 1323    Visit Number 8   Number of Visits 17   Date for OT Re-Evaluation 10/11/14   Authorization Type Medicare   Authorization - Visit Number 8   Authorization - Number of Visits 10   OT Start Time 9381   OT Stop Time 1315   OT Time Calculation (min) 45 min   Equipment Utilized During Treatment UBE   Activity Tolerance Patient tolerated treatment well      Past Medical History  Diagnosis Date  . PTSD (post-traumatic stress disorder)   . Raynaud's disease   . Anxiety   . Hypercholesteremia   . Stroke ([place])   . GERD (gastroesophageal reflux disease) 04/25/2015  . Hereditary and idiopathic peripheral neuropathy 04/25/2015  . Hypertension   . Atrial fibrillation ([place])   . Anxiety   . Migraine     Past Surgical History  Procedure Laterality Date  . Wrist surgery    . Abdominal hysterectomy    . Knee surgery    . Hip surgery  Right  . Tee without cardioversion N/A 09/05/2014    Procedure: TRANSESOPHAGEAL ECHOCARDIOGRAM (TEE);  Surgeon: Josue Hector, MD;  Location: [facility] ENDOSCOPY;  Service: Cardiovascular;  Laterality: N/A;  loop after  . 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  Visit Diagnosis:  Hemiparesis affecting right side as late effect of stroke ([place])  Right arm weakness  Decreased coordination      Subjective Assessment - 09/05/15 1234    Pertinent History see Epic snapshot    Patient Stated Goals reduce shoulder pain    Currently in Pain? Yes   Pain Score 6    Pain Location Back   Pain  Orientation Lower   Pain Descriptors / Indicators Stabbing   Pain Type Chronic pain   Pain Onset More than a month ago   Pain Frequency Constant   Aggravating Factors  standing too long, walking   Pain Relieving Factors rest, heat            [facility] OT Assessment - 09/05/15 0001    Coordination   Right 9 Hole Peg Test 43.59 sec.    Hand Function   Right Hand Grip (lbs) 22 lbs, 15 lbs   Left Hand Grip (lbs) 68 lbs                  OT Treatments/Exercises (OP) - 09/05/15 0001    ADLs   ADL Comments Began assessing STG's (see assessment and goal section)   Exercises   Exercises Hand   Shoulder Exercises: ROM/Strengthening   UBE (Upper Arm Bike) x 5 min. Level 5 for strength/endurance   Hand Exercises   Other Hand Exercises Gripper set at 15 lbs. resistance to pick up blocks with Rt hand with min difficulty and drops, but no rest break required   Other Hand Exercises Gripper set originally at 55 lbs Lt hand (Lt grip = 68 lbs today), however pt unable to do. Reduced to 35 lbs resistance but could only grip a few blocks, then reduced  to 25 lbs resistance to pick up blocks Lt hand with mod to max difficulty and drops.    Neurological Re-education Exercises   Other Exercises 2 BUE AA/ROM with ball seated to above eye level with little ratcheting quality in movement. Therapist moved pt to supine to increase smoothness and ease of movement with sh. girdle stabalized, however pt with increased "ratcheting" movement and only performing shoulder flexion to 45 degrees. Pt then performed BUE AA/ROM in high level sh. flexion with physioball along wall with ratcheting but decreased when distracted by counting going up and down.                   OT Short Term Goals - 09/05/15 1241    OT SHORT TERM GOAL #1   Title I with HEP.   Status Achieved   OT SHORT TERM GOAL #2   Title Pt will verbalize understanding of positioning,and strategies to minimize LUE pain   Status Achieved    OT SHORT TERM GOAL #3   Title Pt will improve RUE grip strength to 22 lbs for increased functional use   Status On-going  Inconsistent (09/05/15 Rt = 22 lbs, then 15 lbs)   OT SHORT TERM GOAL #4   Title Pt will demonstrate improved fine motor coordination as evidenced by decreasing RUE 9 hole peg test score to 45 secs or less.   Baseline 55 sec.   Status Achieved  09/05/15 = 43.59 sec.            OT Long Term Goals - 09/05/15 1242    OT LONG TERM GOAL #1   Title Pt will report ability to use LUE as dominant hand for ADLS/ IADLS 84% of the time with pain less than or equal to 5/10.   Status On-going   OT LONG TERM GOAL #2   Title Pt will increase LUE grip strength to 48lbs or greater for increased functional use.   Status Achieved  09/05/15: Lt = 68 lbs   OT LONG TERM GOAL #3   Title Pt will demonstrate improved fine motor coordination as evidenced by decreasing RUE 9 hole peg test score to 40 secs or less.    Status On-going   OT LONG TERM GOAL #4   Title Pt will demonstrate adequate RUE strength to retrieve 3 lbs from overhead shelf x3 reps without dropping.   Status On-going   OT LONG TERM GOAL #5   Title Pt will demonstrate ability to perform basic home managment/ cooking task modified independently demonstrating good safety awareness.   Status On-going               Plan - 09/05/15 1324    Clinical Impression Statement Pt met STG's #1, 2, and #4. STG #3 - Pt inconsistent with Rt grip strength with each assessment (ranging from 15 lbs to 22 lbs). Also met LTG #2 with Lt grip strength 68 lbs, however unable to perform gripper/block activity at 55 lbs or 35 lbs resistance   Plan continue RUE strengthening and NMR   OT Home Exercise Plan 08/17/15: coordination and putty HEP. 08/23/15: Cane HEP    Consulted and Agree with Plan of Care Patient        Problem List Patient Active Problem List   Diagnosis Date Noted  . Generalized anxiety disorder 04/25/2015  .  History of depression 04/25/2015  . Raynaud's disease 04/25/2015  . GERD (gastroesophageal reflux disease) 04/25/2015  . Neuropathy ([place]) 04/25/2015  . Neck pain  04/25/2015  . Atrial fibrillation ([place]) 10/14/2014  . Acute CVA (cerebrovascular accident) ([place])   . Rash and nonspecific skin eruption   . Cerebral embolism with cerebral infarction ([place])   . 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 Tobacco abuse   . TIA (transient ischemic attack) 08/30/2014  . Hypercholesterolemia 08/30/2014    Carey Bullocks, OTR/L 09/05/2015, 1:30 PM  [facility] [address] [place], [place], [address] Phone: [phone]   Fax:  [phone]  Name: Ann Bohne MRN: [id number] Date of Birth: [birth year]

## 2015-09-05 NOTE — Therapy (Signed)
[facility] [address] [place], [place], [address] Phone: [phone]   Fax:  [phone]  Physical Therapy Treatment  Patient Details  Name: Dawn Escobar MRN: [id number] Date of Birth: 1960-11-30 No Data Recorded  Encounter Date: 09/05/2015      PT End of Session - 09/05/15 1301    Visit Number 10   Number of Visits 17   Date for PT Re-Evaluation 09/29/15   Authorization Type MCR-G codes every 10th visit   PT Start Time 1145   PT Stop Time 1230   PT Time Calculation (min) 45 min   Activity Tolerance Patient tolerated treatment well   Behavior During Therapy [facility] for tasks assessed/performed      Past Medical History  Diagnosis Date  . PTSD (post-traumatic stress disorder)   . Raynaud's disease   . Anxiety   . Hypercholesteremia   . Stroke ([place])   . GERD (gastroesophageal reflux disease) 04/25/2015  . Hereditary and idiopathic peripheral neuropathy 04/25/2015  . Hypertension   . Atrial fibrillation ([place])   . Anxiety   . Migraine     Past Surgical History  Procedure Laterality Date  . Wrist surgery    . Abdominal hysterectomy    . Knee surgery    . Hip surgery  Right  . Tee without cardioversion N/A 09/05/2014    Procedure: TRANSESOPHAGEAL ECHOCARDIOGRAM (TEE);  Surgeon: Josue Hector, MD;  Location: [facility] ENDOSCOPY;  Service: Cardiovascular;  Laterality: N/A;  loop after  . 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  Visit Diagnosis:  Abnormality of gait  Unsteadiness  Right-sided low back pain with right-sided sciatica  Hemiparesis affecting right side as late effect of stroke ([place])      Subjective Assessment - 09/05/15 1154    Subjective reports no changes since last time, no falls.    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 Yes   Pain Score 6    Pain Location Back   Pain Orientation Lower   Pain Descriptors / Indicators Stabbing   Pain Type Chronic pain   Pain Onset More than a month ago   Pain Frequency Constant   Pain Score 5   Pain Location Shoulder   Pain Orientation Left   Pain Descriptors / Indicators Sharp   Pain Type Chronic pain   Pain Onset More than a month ago   Pain Frequency Intermittent                 Therex:  Performed child's pose low back stretch at midline x 2 reps of 60 secs each.  Tolerated well.  Then performed SL hip abd with clam shell exercise x 10 reps each side with light tactile cues for decreased trunk rotation.  Progressed to BLE strengthening in open and closed chain with 4 way hip strengthening with red theraband.  Cues for decreased UE support when performing task with LLE and stabilizing on RLE to increase strength and stabilization in RLE.  Also requires tactile cues for upright posture as she continues to demonstrate posterior lean on hip ligaments. Continue to provide education on importance of compliance with HEP for core and hip strengthening to reduce low back pain.  Pt  verbalized understanding.                 PT Education - 09/05/15 1301    Education provided Yes   Education Details Education on importance of compliance with back and core HEP provided on last visits   Person(s) Educated Patient   Methods Explanation   Comprehension Verbalized understanding          PT Short Term Goals - 09/03/15 1155    PT SHORT TERM GOAL #1   Title Pt will initiate HEP for strengthening and balance to decrease fall risk and improve functional mobility.  (Target Date: 08/28/15)   Baseline met 09/03/15   Status Achieved   PT SHORT TERM GOAL #2   Title Pt will increase DGI to 22/24 in order to indicate increase in functional balance and  decreased fall risk.    (Target Date: 08/28/15)   Baseline 19/24 on 09/03/15   Status Not Met   PT SHORT TERM GOAL #3   Title Pt will verbalize CVA warning signs independently to indicate decreased time to seek medical attention in case of future CVA.   (Target Date: 08/28/15)   Baseline met 09/03/15   Status Achieved   PT SHORT TERM GOAL #4   Title Pt will report no more than 2 point increase in back pain during therapy session to indicate that pain is decreased limiting factor in mobility.   (Target Date: 08/28/15)   Baseline reports that pain stays the same (about 5-6) 09/03/15   Status Achieved   PT SHORT TERM GOAL #5   Title Will assess 6MWT and increase by 150' in order to indicate increase in fucntional endurance.    (Target Date: 08/28/15)   Baseline 1177' on 09/03/15, demonstrating improved functional endurance as pt was unable to complete during last visit.     Status Achieved           PT Long Term Goals - 07/31/15 1843    PT LONG TERM GOAL #1   Title Pt will be independent with HEP for strengthening and balance in order to indicate decreased fall risk and improved functional mobility. (Target Date: 09/25/15)   Time 8   Period Weeks   PT LONG TERM GOAL #2   Title Pt will increase R hip strength to 4/5 to indicate increased strength and improved functional mobility.   (Target Date: 09/25/15)   Time 8   Period Weeks   PT LONG TERM GOAL #3   Title Pt will initiate participation in community fitness program to continue to improve functional mobility beyond therapy.   (Target Date: 09/25/15)   Time 8   Period Weeks   PT LONG TERM GOAL #4   Title Pt will ambulate 500' without AD over outdoor surfaces (paved, grass, curb, etc) at mod I level to indicate safe return to community.   (Target Date: 09/25/15)   Time 8   PT LONG TERM GOAL #5   Title Pt will increase 6MWT by 150' to indicate increase in functional endurance.   (Target Date: 09/25/15)               Plan -  09/05/15 1301    Clinical Impression Statement Skilled session focused on BLE strengthening and stabilization with 4 way hip exercise.  Also provided with child's pose stretch for low back due to increased pain and hip abd strengthening with clam shells.  Tolerated well during session.     Pt will benefit from skilled therapeutic  intervention in order to improve on the following deficits Abnormal gait;Decreased activity tolerance;Decreased balance;Decreased cognition;Decreased coordination;Decreased endurance;Decreased mobility;Decreased strength;Difficulty walking;Impaired perceived functional ability;Impaired sensation;Impaired UE functional use;Postural dysfunction;Improper body mechanics;Dizziness   Rehab Potential Good   PT Frequency 2x / week   PT Duration 8 weeks   PT Treatment/Interventions ADLs/Self Care Home Management;Electrical Stimulation;Moist Heat;Ultrasound;Gait training;Stair training;Functional mobility training;Therapeutic activities;Therapeutic exercise;Neuromuscular re-education;Balance training;Patient/family education;Manual techniques;Vestibular   PT Next Visit Plan RLE strengthening in closed chain and with resistance bands. Add balance to HEP   Consulted and Agree with Plan of Care Patient          G-Codes - 2015/09/27 1303    Functional Assessment Tool Used 19/24 DGI   Functional Limitation Mobility: Walking and moving around   Mobility: Walking and Moving Around Current Status [phone]) At least 20 percent but less than 40 percent impaired, limited or restricted   Mobility: Walking and Moving Around Goal Status [phone]) At least 1 percent but less than 20 percent impaired, limited or restricted      Physical Therapy Progress Note  Dates of Reporting Period: 07/31/15 to 09/27/2015  Objective Reports of Subjective Statement: See above.  Pt still with c/o low back and L shoulder pain.  Provided multiple exercises for low back strengthening as well as general core and hip  strengthening.   Objective Measurements: DGI 19/24  Goal Update: See LTG"s above  Plan: Continue POC as above  Reason Skilled Services are Required: Pt continues to have R sided hemiparesis w/ decreased sensation, low back pain, decreased balance and poor core strength/stabilization.      Problem List Patient Active Problem List   Diagnosis Date Noted  . Generalized anxiety disorder 04/25/2015  . History of depression 04/25/2015  . Raynaud's disease 04/25/2015  . GERD (gastroesophageal reflux disease) 04/25/2015  . Neuropathy ([place]) 04/25/2015  . Neck pain 04/25/2015  . Atrial fibrillation ([place]) 10/14/2014  . Acute CVA (cerebrovascular accident) ([place])   . Rash and nonspecific skin eruption   . Cerebral embolism with cerebral infarction ([place])   . 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 Tobacco abuse   . TIA (transient ischemic attack) 08/30/2014  . Hypercholesterolemia 08/30/2014    Cameron Sprang, PT, MPT [facility] [address] [place], [place], [address] Phone: [phone]   Fax:  [phone] 2015-09-27, 1:04 PM  Name: Dawn Escobar MRN: [id number] Date of Birth: [birth year]

## 2015-09-11 ENCOUNTER — Ambulatory Visit: Payer: Medicare Other | Admitting: Occupational Therapy

## 2015-09-11 ENCOUNTER — Ambulatory Visit: Payer: Medicare Other | Admitting: Rehabilitation

## 2015-09-12 NOTE — Telephone Encounter (Signed)
No charge. But could you kindly call and inform her of policy for future. Then I could charge and now she is aware.

## 2015-09-12 NOTE — Telephone Encounter (Signed)
Pt was no show 09/04/15 11:15am for follow up appt, pt did not reschedule, 1st no show I see, charge or no charge?

## 2015-09-13 ENCOUNTER — Encounter: Payer: Self-pay | Admitting: Neurology

## 2015-09-13 ENCOUNTER — Encounter: Payer: Medicare Other | Admitting: Occupational Therapy

## 2015-09-13 ENCOUNTER — Ambulatory Visit (INDEPENDENT_AMBULATORY_CARE_PROVIDER_SITE_OTHER): Payer: Medicare Other | Admitting: Neurology

## 2015-09-13 ENCOUNTER — Ambulatory Visit: Payer: Medicare Other | Admitting: Rehabilitation

## 2015-09-13 VITALS — BP 135/77 | HR 77 | Ht 65.0 in | Wt 191.6 lb

## 2015-09-13 DIAGNOSIS — M5010 Cervical disc disorder with radiculopathy, unspecified cervical region: Secondary | ICD-10-CM

## 2015-09-13 DIAGNOSIS — I6340 Cerebral infarction due to embolism of unspecified cerebral artery: Secondary | ICD-10-CM

## 2015-09-13 MED ORDER — GABAPENTIN 300 MG PO CAPS: 300.0000 mg | ORAL_CAPSULE | Freq: Three times a day (TID) | ORAL | Status: DC

## 2015-09-13 MED ORDER — SERTRALINE HCL 50 MG PO TABS: 100.0000 mg | ORAL_TABLET | Freq: Every day | ORAL | Status: DC

## 2015-09-13 NOTE — Progress Notes (Signed)
[facility] [address] [place]. [place] [address] [phone]       OFFICE FOLLOW-UP NOTE  Dawn. Dawn Escobar Date of Birth:  [birth year] Medical Record Number:  [id number]   HPI: Dawn Escobar is a 57 Caucasian lady who is referred by Dr. Willa Rough from the emergency room for urgent neurological evaluation. 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 At that time she underwent extensive evaluation for source of embolism which was negative. However she had loop recorder inserted which a few months later showed paroxysmal atrial fibrillation. She has been on eliquis since then he is tolerating it well but she states that she did financial constraints was not able to get any of her other medications and stopped all her other medicines. For the last 1 month she's been having multifocal symptoms of right-sided headaches, muscle twitchings, dizziness, tremor and increased anxiety. She was taking clonazepam when necessary as well as admission: 25 mg at night for anxiety which she clearly has not been taking for the last month or so. She is also noted some subjective worsening of her mild right-sided weakness from a prior stroke as well as numbness. She actually was supposed to see me for follow-up following a previous stroke but she missed one appointment and could not afford to make another appointment due to her finding medical bills. She denies any new focal weakness, double vision, vertigo. Update 06/06/2015 : She is seen urgently today upon last office visit 1 month ago as she was admitted to the hospital on 06/01/15 complaining of headache dizziness speech difficulty. MRI scan of the brain showed acute to subacute left frontal and insular cortex infarct despite being on chronic anticoagulation with eliquis. Carotid ultrasound and transthoracic echo were unremarkable. Hemoglobin A1c was  5.7. Lipid profile showed total cholesterol 243, triglycerides 72, HDL 43 and elevated LDL 185 mg percent. She states that she has been compliant with the medication. She states her speech is improving though she still has some speech hesitancy and word finding difficulty. She is getting a home physical and speech therapy which is about to be completed. She is not clear if there are any plans for outpatient therapy. She complains of increased anxiety, tiredness, decreased appetite, back pain. She states her blood pressure is well controlled and today it is 105/60. She is tolerating eliquis well without bleeding or bruising. She remains on Lipitor and is tolerating it well. Update 09/13/2015 : She is seen urgently today as she requested to be seen for new complaint. She is complaining of left shoulder pain as well as hand numbness for the last several weeks. She was seen by physical therapist and thinks she may have had a pinched nerve. She denies any recent fall, head or neck injury. She has no known prior history of radiculopathy and herniated disc. She describes the pain as starting on the left side of the neck and radiating down to left shoulder and occasionally into the left hand. Pain at times is sharp and shooting. She also complains of some numbness in thumb and index finger. He denies any weakness and trouble gripping objects and holding objects. She remains on eliquis for stroke prevention is tolerating it well without bleeding and only occasional bruising. She has been started on Zoloft 50 mg daily by primary physician for anxiety for the last 1 month but feels she needs a higher dose. She has had  no recurrent stroke or TIA symptoms ROS:   14 system review of systems is positive for  fatigue, excessive sweating, double vision, blurred vision, shortness of breath, cold intolerance, excessive thirst, diarrhea, frequent waking, daytime sleepiness, easy bruising and bleeding, dizziness, headache, numbness,  weakness, agitation, nervousness anxiety, neck pain joint and back pain aching muscles and imaging and all other systems negative PMH:  Past Medical History  Diagnosis Date  . PTSD (post-traumatic stress disorder)   . Raynaud's disease   . Anxiety   . Hypercholesteremia   . Stroke ([facility])   . GERD (gastroesophageal reflux disease) 04/25/2015  . Hereditary and idiopathic peripheral neuropathy 04/25/2015  . Hypertension   . Atrial fibrillation ([facility])   . Anxiety   . Migraine     Social History:  Social History   Social History  . Marital Status: Single    Spouse Name: N/A  . Number of Children: 0  . Years of Education: 12th   Occupational History  . n/a    Social History Main Topics  . Smoking status: Former Smoker -- 0.20 packs/day    Types: Cigarettes  . Smokeless tobacco: Never Used  . Alcohol Use: 0.6 oz/week    0 Standard drinks or equivalent, 1 Glasses of wine per week     Comment: occasionally  . Drug Use: No  . Sexual Activity: Not on file   Other Topics Concern  . Not on file   Social History Narrative   Patient lives at home alone.   Caffeine use: 1 cup of coffee and 20 oz of soda    Medications:   Current Outpatient Prescriptions on File Prior to Visit  Medication Sig Dispense Refill  . acetaminophen (TYLENOL) 500 MG tablet Take 500 mg by mouth every 6 (six) hours as needed for mild pain, moderate pain or headache.    Marland Kitchen. amitriptyline (ELAVIL) 25 MG tablet Take 1 tablet (25 mg total) by mouth daily. (Patient taking differently: Take 25 mg by mouth at bedtime. ) 90 tablet 3  . apixaban (ELIQUIS) 5 MG TABS tablet Take 1 tablet (5 mg total) by mouth 2 (two) times daily. 60 tablet 11  . clonazePAM (KLONOPIN) 0.5 MG tablet Take 1 tablet (0.5 mg total) by mouth 2 (two) times daily as needed (anxiety). 60 tablet 0  . clotrimazole-betamethasone (LOTRISONE) cream Apply 1 application topically 2 (two) times daily. 30 g 0  . fluticasone (FLONASE) 50 MCG/ACT nasal spray  Place 2 sprays into both nostrils daily. 16 g 1  . methocarbamol (ROBAXIN) 500 MG tablet Take 1 tablet (500 mg total) by mouth at bedtime as needed for muscle spasms (take one daily by mouth at bedtime if needed). 30 tablet 0  . NEXIUM 40 MG capsule TAKE 1 CAPSULE (40 MG TOTAL) BY MOUTH DAILY. 30 capsule 2  . omeprazole (PRILOSEC) 20 MG capsule Take 1 capsule (20 mg total) by mouth daily. 30 capsule 1  . rosuvastatin (CRESTOR) 40 MG tablet Take 1 tablet (40 mg total) by mouth daily. 30 tablet 5  . traZODone (DESYREL) 100 MG tablet TAKE 1 TABLET (100 MG TOTAL) BY MOUTH AT BEDTIME. 30 tablet 1  . verapamil (CALAN-SR) 120 MG CR tablet Take 1 tablet (120 mg total) by mouth daily. 30 tablet 5   No current facility-administered medications on file prior to visit.    Allergies:   Allergies  Allergen Reactions  . Morphine And Related Hives  . Tape Rash    No tape at all  Physical Exam General: well developed, well nourished middle-aged lady, seated, in no evident distress Head: head normocephalic and atraumatic.  Neck: supple with no carotid or supraclavicular bruits Cardiovascular: regular rate and rhythm, no murmurs Musculoskeletal: no deformity Skin:  no rash/petichiae Vascular:  Normal pulses all extremities Filed Vitals:   09/13/15 1523  BP: 135/77  Pulse: 77   Neurologic Exam Mental Status: Awake and fully alert. Oriented to place and time. Recent and remote memory intact. Attention span, concentration and fund of knowledge appropriate. Mood and affect appropriate.   Cranial Nerves: Fundoscopic exam reveals sharp disnot donec  . Pupils equal, briskly reactive to light. Extraocular movements full without nystagmus. Visual fields full to confrontation. Hearing intact. Facial sensation intact. Face, tongue, palate moves normally and symmetrically.  Motor: Normal bulk and tone. Normal strength in all tested extremity muscles. Mild right grip weakness. Diminished fine finger movements  on the right. Orbits left over right approximate it. Mild weakness of right ankle dorsiflexors and hip flexors only. Sensory.: intact to touch ,pinprick .position and vibratory sensation. Except slight diminished sensation in the right upper extremity and right lower face.  Coordination: Rapid alternating movements normal in all extremities. Finger-to-nose and heel-to-shin performed accurately bilaterally. Gait and Station: Arises from chair without difficulty. Stance is normal. Gait demonstrates normal stride length and balance . Able to heel, toe and tandem walk without difficulty.  Reflexes: 1+ and symmetric. Toes downgoing.   NIHSS  1 Modified Rankin  1   ASSESSMENT: 26 year Caucasian lady with embolic left anterior cerebral artery infarct in November 2015 was subsequently found to have paroxysmal atrial fibrillation on loop recorder a few months later. She has new multifocal complaints of headaches, twitchings, dizziness, tremors which may be related to recrudescence of old deficits with perhaps component of underlying anxiety  recent admission on 06/01/15 due to subacute left MCA infarct despite being on anticoagulation on eliquis. Multiple vascular risk factors of hypertension, hyperlipidemia, atrial fibrillation and  prior stroke New complaints of left arm and hand pain and paresthesias likely from cervical radiculopathy    PLAN: I had a long discussion with the patient with regards to new complaints of left-sided neck and arm pain and paresthesias likely from cervical radiculopathy. Recommend we check EMG nerve conduction study and MRI cervical spine to confirm radiculopathy. I recommend she increase gabapentin to 300 mg 3 times daily as well as take Robaxin as needed. She seems to have significant underlying anxiety and I recommend increasing Zoloft 200 mg daily as well. She was counseled to stay on eliquis for stroke prevention for atrial fibrillation and maintain strict control of lipids  with LDL cholesterol goal below 70 mg percent. She was advised to keep her schedule appointment with me in March next year or call earlier if necessary  Delia Heady, MD   Note: This document was prepared with digital dictation and possible smart phrase technology. Any transcriptional errors that result from this process are unintentional

## 2015-09-13 NOTE — Patient Instructions (Addendum)
I had a long discussion with the patient with regards to new complaints of left-sided neck and arm pain and paresthesias likely from cervical radiculopathy. Recommend we check EMG nerve conduction study and MRI cervical spine to confirm radiculopathy. I recommend she increase gabapentin to 300 mg 3 times daily as well as take Robaxin as needed. She seems to have significant underlying anxiety and I recommend increasing Zoloft 200 mg daily as well. She was counseled to stay on eliquis for stroke prevention for atrial fibrillation and maintain strict control of lipids with LDL cholesterol goal below 70 mg percent. She was advised to keep her schedule appointment with me in March next year or call earlier if necessary  Cervical Radiculopathy Cervical radiculopathy happens when a nerve in the neck (cervical nerve) is pinched or bruised. This condition can develop because of an injury or as part of the normal aging process. Pressure on the cervical nerves can cause pain or numbness that runs from the neck all the way down into the arm and fingers. Usually, this condition gets better with rest. Treatment may be needed if the condition does not improve.  CAUSES This condition may be caused by:  Injury.  Slipped (herniated) disk.  Muscle tightness in the neck because of overuse.  Arthritis.  Breakdown or degeneration in the bones and joints of the spine (spondylosis) due to aging.  Bone spurs that may develop near the cervical nerves. SYMPTOMS Symptoms of this condition include:  Pain that runs from the neck to the arm and hand. The pain can be severe or irritating. It may be worse when the neck is moved.  Numbness or weakness in the affected arm and hand. DIAGNOSIS This condition may be diagnosed based on symptoms, medical history, and a physical exam. You may also have tests, including:  X-rays.  CT scan.  MRI.  Electromyogram (EMG).  Nerve conduction tests. TREATMENT In many cases,  treatment is not needed for this condition. With rest, the condition usually gets better over time. If treatment is needed, options may include:  Wearing a soft neck collar for short periods of time.  Physical therapy to strengthen your neck muscles.  Medicines, such as NSAIDs, oral corticosteroids, or spinal injections.  Surgery. This may be needed if other treatments do not help. Various types of surgery may be done depending on the cause of your problems. HOME CARE INSTRUCTIONS Managing Pain  Take over-the-counter and prescription medicines only as told by your health care provider.  If directed, apply ice to the affected area.  Put ice in a plastic bag.  Place a towel between your skin and the bag.  Leave the ice on for 20 minutes, 2-3 times per day.  If ice does not help, you can try using heat. Take a warm shower or warm bath, or use a heat pack as told by your health care provider.  Try a gentle neck and shoulder massage to help relieve symptoms. Activity  Rest as needed. Follow instructions from your health care provider about any restrictions on activities.  Do stretching and strengthening exercises as told by your health care provider or physical therapist. General Instructions  If you were given a soft collar, wear it as told by your health care provider.  Use a flat pillow when you sleep.  Keep all follow-up visits as told by your health care provider. This is important. SEEK MEDICAL CARE IF:  Your condition does not improve with treatment. SEEK IMMEDIATE MEDICAL CARE IF:  Your pain gets much worse and cannot be controlled with medicines.  You have weakness or numbness in your hand, arm, face, or leg.  You have a high fever.  You have a stiff, rigid neck.  You lose control of your bowels or your bladder (have incontinence).  You have trouble with walking, balance, or speaking.   This information is not intended to replace advice given to you by your  health care provider. Make sure you discuss any questions you have with your health care provider.   Document Released: 06/17/2001 Document Revised: 06/13/2015 Document Reviewed: 11/16/2014 Elsevier Interactive Patient Education [employer]

## 2015-09-14 NOTE — Progress Notes (Signed)
Carelink summary report received. Battery status OK. Normal device function. No new symptom episodes, tachy episodes, brady, or pause episodes. No new AF episodes, +Eliquis. Monthly summary reports and ROV with AS on 10/17/15 at 1:00pm.

## 2015-09-17 ENCOUNTER — Ambulatory Visit (INDEPENDENT_AMBULATORY_CARE_PROVIDER_SITE_OTHER): Payer: Medicare Other | Admitting: *Deleted

## 2015-09-17 DIAGNOSIS — I639 Cerebral infarction, unspecified: Secondary | ICD-10-CM

## 2015-09-17 NOTE — Progress Notes (Signed)
Carelink Summary Report / Loop Recorder 

## 2015-09-18 ENCOUNTER — Ambulatory Visit: Payer: Medicare Other | Admitting: Rehabilitation

## 2015-09-18 ENCOUNTER — Ambulatory Visit: Payer: Medicare Other | Admitting: Occupational Therapy

## 2015-09-19 ENCOUNTER — Other Ambulatory Visit: Payer: Self-pay

## 2015-09-19 MED ORDER — TRAZODONE HCL 100 MG PO TABS: ORAL_TABLET | ORAL | Status: DC

## 2015-09-19 MED ORDER — OMEPRAZOLE 20 MG PO CPDR: 20.0000 mg | DELAYED_RELEASE_CAPSULE | Freq: Every day | ORAL | Status: DC

## 2015-09-19 NOTE — Telephone Encounter (Signed)
rx trazadone sent to her pharmacy

## 2015-09-19 NOTE — Telephone Encounter (Signed)
Patient is requesting trazodone 100 mg tablets. Please advise on refill.

## 2015-09-20 ENCOUNTER — Ambulatory Visit: Payer: Medicare Other | Admitting: Rehabilitation

## 2015-09-20 ENCOUNTER — Encounter: Payer: Medicare Other | Admitting: Occupational Therapy

## 2015-09-24 ENCOUNTER — Other Ambulatory Visit: Payer: Self-pay

## 2015-09-24 MED ORDER — CLONAZEPAM 0.5 MG PO TABS: 0.5000 mg | ORAL_TABLET | Freq: Two times a day (BID) | ORAL | Status: DC | PRN

## 2015-09-24 NOTE — Telephone Encounter (Signed)
I did refill her clonazepam. She can pick up the rx. It is little early. Will go ahead and fill since upcoming holiday. Remind her max frequency is twice daily.

## 2015-09-24 NOTE — Telephone Encounter (Signed)
Dawn Escobar please advise on refill.  

## 2015-09-25 ENCOUNTER — Encounter: Payer: Medicare Other | Admitting: Occupational Therapy

## 2015-09-26 ENCOUNTER — Ambulatory Visit
Admission: RE | Admit: 2015-09-26 | Discharge: 2015-09-26 | Disposition: A | Discharge status: Home / Self Care | Payer: Medicare Other | Source: Ambulatory Visit | Attending: Neurology | Admitting: Neurology

## 2015-09-26 ENCOUNTER — Telehealth: Payer: Self-pay | Admitting: Medical

## 2015-09-26 DIAGNOSIS — M5010 Cervical disc disorder with radiculopathy, unspecified cervical region: Secondary | ICD-10-CM

## 2015-09-26 DIAGNOSIS — M4802 Spinal stenosis, cervical region: Secondary | ICD-10-CM | POA: Diagnosis not present

## 2015-09-26 NOTE — Telephone Encounter (Signed)
Spoke with pt and she states that the pain has increased from the top of the spine to the bottom and pt says back also hurts a lot more than normal. Pt was advised earlier today to take an extra gabapentin and she stated that " it did not help her with the pain any. Per VO from ES to take one Aleve twice a day in between medication. Pt voices understanding. Pt has an appointment on 09/28/15.

## 2015-09-26 NOTE — Telephone Encounter (Signed)
Caller name: Self   Can be reached: [phone]    Reason for call: Patient states she is having a lot of anxiety and pain in her back and neck. States the medications that she currently take are not helping.

## 2015-09-27 ENCOUNTER — Encounter: Payer: Medicare Other | Admitting: Occupational Therapy

## 2015-09-28 ENCOUNTER — Telehealth: Payer: Self-pay | Admitting: Behavioral Health

## 2015-09-28 ENCOUNTER — Ambulatory Visit (INDEPENDENT_AMBULATORY_CARE_PROVIDER_SITE_OTHER): Payer: Medicare Other | Admitting: Medical

## 2015-09-28 ENCOUNTER — Encounter: Payer: Self-pay | Admitting: Medical

## 2015-09-28 ENCOUNTER — Other Ambulatory Visit: Payer: Self-pay | Admitting: Medical

## 2015-09-28 ENCOUNTER — Ambulatory Visit (HOSPITAL_BASED_OUTPATIENT_CLINIC_OR_DEPARTMENT_OTHER)
Admission: RE | Admit: 2015-09-28 | Discharge: 2015-09-28 | Disposition: A | Discharge status: Home / Self Care | Payer: Medicare Other | Source: Ambulatory Visit | Attending: Medical | Admitting: Medical

## 2015-09-28 VITALS — BP 126/80 | HR 77 | Temp 98.1°F | Ht 65.0 in | Wt 191.0 lb

## 2015-09-28 DIAGNOSIS — M5136 Other intervertebral disc degeneration, lumbar region: Secondary | ICD-10-CM | POA: Diagnosis not present

## 2015-09-28 DIAGNOSIS — I6340 Cerebral infarction due to embolism of unspecified cerebral artery: Secondary | ICD-10-CM

## 2015-09-28 DIAGNOSIS — M545 Low back pain, unspecified: Secondary | ICD-10-CM

## 2015-09-28 DIAGNOSIS — M546 Pain in thoracic spine: Secondary | ICD-10-CM | POA: Insufficient documentation

## 2015-09-28 DIAGNOSIS — M792 Neuralgia and neuritis, unspecified: Secondary | ICD-10-CM

## 2015-09-28 DIAGNOSIS — M542 Cervicalgia: Secondary | ICD-10-CM

## 2015-09-28 DIAGNOSIS — Z8739 Personal history of other diseases of the musculoskeletal system and connective tissue: Secondary | ICD-10-CM

## 2015-09-28 DIAGNOSIS — M4802 Spinal stenosis, cervical region: Secondary | ICD-10-CM

## 2015-09-28 MED ORDER — KETOROLAC TROMETHAMINE 60 MG/2ML IM SOLN
60.0000 mg | Freq: Once | INTRAMUSCULAR | Status: AC
  Administered 2015-09-28: 60 mg via INTRAMUSCULAR

## 2015-09-28 MED ORDER — DULOXETINE HCL 30 MG PO CPEP: ORAL_CAPSULE | ORAL | Status: DC

## 2015-09-28 NOTE — Patient Instructions (Addendum)
For your neck pain, cervical stenosis and radiating pain to upper ext will refer you to neurosurgeon.  We gave you toradol 60 mg im today.  Continue meloxicam but start tomorrow if you have not had any today.  Continue robaxin.  Continue neurontin.  I am adding cymbalta  Your hive history to narcotics presents a problem.  Follow up in 7 days or as needed

## 2015-09-28 NOTE — Progress Notes (Addendum)
Subjective:    Patient ID: Dawn Escobar, female    DOB: [birth year], 54 y.o.   MRN: [id number]  HPI   Pt in with some back pain. Pt states pain for about one month. Pain is on severe side. Pt states pain neck, thoracic and lower back. Pt in past when I saw her  She was not having pain.  Pt also states hx of fibromyalgia. She used to be on cymbalta but that was stopped. Dr. Pearlean BrownieSethi. Increased neurontin to 300 mg tid. And sertraline to 100 mg a day.   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 On recent mri this Wednesday.  Pt thinks clonazepam not working for her as well for her anxiety.    Review of Systems  Constitutional: Negative for fever, chills and fatigue.  Respiratory: Negative for cough, chest tightness and wheezing.   Cardiovascular: Negative for chest pain and palpitations.  Gastrointestinal: Negative for nausea, vomiting and abdominal pain.       No incontinence.  Genitourinary: Negative for dysuria, urgency, hematuria and flank pain.  Musculoskeletal: Positive for back pain, neck pain and neck stiffness. Negative for myalgias and joint swelling.  Skin: Negative for rash.  Neurological: Negative for dizziness, weakness and numbness.       No radiating pain to lower legs.   Radiating pain neck to rt arm.  Hematological: Negative for adenopathy. Does not bruise/bleed easily.  Psychiatric/Behavioral: Negative for behavioral problems, confusion and dysphoric mood. The patient is not nervous/anxious.      Past Medical History  Diagnosis Date  . PTSD (post-traumatic stress disorder)   . Raynaud's disease   . Anxiety   . Hypercholesteremia   . Stroke (HCC)   . GERD (gastroesophageal reflux disease) 04/25/2015  . Hereditary and idiopathic peripheral neuropathy 04/25/2015  . Hypertension   . Atrial fibrillation (HCC)   . Anxiety   . Migraine     Social History   Social History  . Marital Status: Single    Spouse Name: N/A  . Number of Children: 0    . Years of Education: 12th   Occupational History  . n/a    Social History Main Topics  . Smoking status: Former Smoker -- 0.20 packs/day    Types: Cigarettes  . Smokeless tobacco: Never Used  . Alcohol Use: 0.6 oz/week    0 Standard drinks or equivalent, 1 Glasses of wine per week     Comment: occasionally  . Drug Use: No  . Sexual Activity: Not on file   Other Topics Concern  . Not on file   Social History Narrative   Patient lives at home alone.   Caffeine use: 1 cup of coffee and 20 oz of soda    Past Surgical History  Procedure Laterality Date  . Wrist surgery    . Abdominal hysterectomy    . Knee surgery    . Hip surgery  Right  . Tee without cardioversion N/A 09/05/2014    Procedure: TRANSESOPHAGEAL ECHOCARDIOGRAM (TEE);  Surgeon: Wendall StadePeter C Nishan, MD;  Location[facility] END[facility]COPY;  Service: Cardiovascular;  Laterality: N/A;  loop after  . Loop recorder implant N/A 09/05/2014    MDT LINQ implanted by Dr Johney FrameAllred for cryptogenic stroke    Family History  Problem Relation Age of Onset  . Colon cancer Brother   . Heart disease Mother   . Heart attack Father     Allergies  Allergen Reactions  . Morphine And Related Hives  . Tape Rash  No tape at all    Current Outpatient Prescriptions on File Prior to Visit  Medication Sig Dispense Refill  . acetaminophen (TYLENOL) 500 MG tablet Take 500 mg by mouth every 6 (six) hours as needed for mild pain, moderate pain or headache.    Marland Kitchen amitriptyline (ELAVIL) 25 MG tablet Take 1 tablet (25 mg total) by mouth daily. (Patient taking differently: Take 25 mg by mouth at bedtime. ) 90 tablet 3  . apixaban (ELIQUIS) 5 MG TABS tablet Take 1 tablet (5 mg total) by mouth 2 (two) times daily. 60 tablet 11  . clonazePAM (KLONOPIN) 0.5 MG tablet Take 1 tablet (0.5 mg total) by mouth 2 (two) times daily as needed (anxiety). 60 tablet 0  . clotrimazole-betamethasone (LOTRISONE) cream Apply 1 application topically 2 (two) times daily. 30 g 0   . fluticasone (FLONASE) 50 MCG/ACT nasal spray Place 2 sprays into both nostrils daily. 16 g 1  . gabapentin (NEURONTIN) 300 MG capsule Take 1 capsule (300 mg total) by mouth 3 (three) times daily. 90 capsule 11  . methocarbamol (ROBAXIN) 500 MG tablet Take 1 tablet (500 mg total) by mouth at bedtime as needed for muscle spasms (take one daily by mouth at bedtime if needed). 30 tablet 0  . NEXIUM 40 MG capsule TAKE 1 CAPSULE (40 MG TOTAL) BY MOUTH DAILY. 30 capsule 2  . omeprazole (PRILOSEC) 20 MG capsule Take 1 capsule (20 mg total) by mouth daily. 30 capsule 1  . rosuvastatin (CRESTOR) 40 MG tablet Take 1 tablet (40 mg total) by mouth daily. 30 tablet 5  . sertraline (ZOLOFT) 50 MG tablet Take 2 tablets (100 mg total) by mouth daily. 30 tablet 3  . traZODone (DESYREL) 100 MG tablet TAKE 1 TABLET (100 MG TOTAL) BY MOUTH AT BEDTIME. 30 tablet 2  . verapamil (CALAN-SR) 120 MG CR tablet Take 1 tablet (120 mg total) by mouth daily. 30 tablet 5   No current facility-administered medications on file prior to visit.    BP 126/80 mmHg  Pulse 77  Temp(Src) 98.1 F (36.7 C) (Oral)  Ht  (1.651 m)  Wt 191 lb (86.637 kg)  BMI 31.78 kg/m2  SpO2 97%       Objective:   Physical Exam  General Appearance- Not in acute distress.  nek- mild - moderate  pain mid cspine   Chest and Lung Exam Auscultation: Breath sounds:-Normal. Clear even and unlabored. Adventitious sounds:- No Adventitious sounds.  Cardiovascular Auscultation:Rythm - Regular, rate and rythm. Heart Sounds -Normal heart sounds.  Abdomen Inspection:-Inspection Normal.  Palpation/Perucssion: Palpation and Percussion of the abdomen reveal- Non Tender, No Rebound tenderness, No rigidity(Guarding) and No Palpable abdominal masses.  Liver:-Normal.  Spleen:- Normal.   Back Mid lumbar spine tenderness to palpation. Pain on straight leg lift. Pain on lateral movements and flexion/extension of the spine.  Thoracic  spine- Midline tenderness to palpation as well  Lower ext neurologic  L5-S1 sensation intact bilaterally. Normal patellar reflexes bilaterally. No foot drop bilaterally.  Upper ext- rt arm grip 3-4/5 compared to lt side 5/5      Assessment & Plan:  For your neck pain, cervical stenosis and radiating pain to upper ext will refer you to neurosurgeon.  We gave you toradol 60 mg im today.  Continue meloxicam but start tomorrow if you have not had any today.  Continue robaxin.  Continue neurontin.  I am adding cymbalta  Your hive history to narcotics presents a problem.  Follow up in 7  days or as needed

## 2015-09-28 NOTE — Telephone Encounter (Deleted)
The Rx will need to be rewritten to state one tablet for specified days and then a second Rx for two tablets to include all other remaining days.

## 2015-09-28 NOTE — Telephone Encounter (Signed)
The Rx will need to be rewritten to state one tablet for specified days and then a second Rx for two tablets to include all other remaining days. Rx resent.

## 2015-09-28 NOTE — Progress Notes (Signed)
Pre visit review using our clinic review tool, if applicable. No additional management support is needed unless otherwise documented below in the visit note. 

## 2015-10-02 ENCOUNTER — Telehealth: Payer: Self-pay | Admitting: Neurology

## 2015-10-02 ENCOUNTER — Telehealth: Payer: Self-pay | Admitting: Medical

## 2015-10-02 NOTE — Telephone Encounter (Signed)
Relation to ZO:XWRUpt:self Call back [phone]   Reason for call:  Patient states she would like Morphine And Related taken off her allergy list.

## 2015-10-02 NOTE — Telephone Encounter (Signed)
Removed per patient request

## 2015-10-02 NOTE — Telephone Encounter (Signed)
Patient called to advise Dr. Pearlean BrownieSethi gave her the results of MRI and wonders if she still needs to keep NCS/EMG appointment. Please call to advise.

## 2015-10-03 ENCOUNTER — Ambulatory Visit (INDEPENDENT_AMBULATORY_CARE_PROVIDER_SITE_OTHER): Payer: Medicare Other | Admitting: Medical

## 2015-10-03 ENCOUNTER — Encounter: Payer: Self-pay | Admitting: Medical

## 2015-10-03 VITALS — BP 126/78 | HR 71 | Temp 98.1°F | Ht 65.0 in | Wt 195.2 lb

## 2015-10-03 DIAGNOSIS — M542 Cervicalgia: Secondary | ICD-10-CM

## 2015-10-03 DIAGNOSIS — M792 Neuralgia and neuritis, unspecified: Secondary | ICD-10-CM

## 2015-10-03 DIAGNOSIS — Z8739 Personal history of other diseases of the musculoskeletal system and connective tissue: Secondary | ICD-10-CM | POA: Diagnosis not present

## 2015-10-03 DIAGNOSIS — I6340 Cerebral infarction due to embolism of unspecified cerebral artery: Secondary | ICD-10-CM

## 2015-10-03 MED ORDER — HYDROCODONE-ACETAMINOPHEN 5-325 MG PO TABS: 1.0000 | ORAL_TABLET | Freq: Four times a day (QID) | ORAL | Status: DC | PRN

## 2015-10-03 NOTE — Progress Notes (Signed)
Pre visit review using our clinic review tool, if applicable. No additional management support is needed unless otherwise documented below in the visit note. 

## 2015-10-03 NOTE — Progress Notes (Signed)
Subjective:    Patient ID: Dawn Escobar, female    DOB: 06-02-1961, 54 y.o.   MRN: [id number]  HPI Pt in with some back pain. Pt states pain for about one month. Pain is on severe side. Pt states pain neck, thoracic and lower back. Pt in past when I saw her She was not having pain. Pt also states hx of fibromyalgia. She used to be on cymbalta but that was stopped. Dr. Pearlean Brownie. Increased neurontin to 300 mg tid. And sertraline to 100 mg a day.   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 On recent mri this Wednesday.  Above is from the last  Pt states toradol helped day of injection.  Pt called and asked morphine and other related to be taken out of her allergy history.   Pt states in past she had reaction to morphine when it was given IM. Then she states has allergy to sheet in hospital. Also since then when hospitalized last year she had a rash that could not be determined.  Pt did get cymbalta. It seems to help some.  Pt pain is in back and neck 7/10 pain.  Pt pretty sure she had oxycodone and dermerol.    Review of Systems  Constitutional: Negative for fever, chills and fatigue.  Respiratory: Negative for cough, chest tightness, shortness of breath and wheezing.   Cardiovascular: Negative for chest pain and palpitations.  Gastrointestinal: Negative for abdominal pain.  Musculoskeletal: Positive for back pain and neck pain.  Neurological: Negative for dizziness, speech difficulty, weakness, numbness and headaches.  Hematological: Negative for adenopathy. Does not bruise/bleed easily.  Psychiatric/Behavioral: Negative for behavioral problems, confusion and dysphoric mood. The patient is not nervous/anxious.      Past Medical History  Diagnosis Date  . PTSD (post-traumatic stress disorder)   . Raynaud's disease   . Anxiety   . Hypercholesteremia   . Stroke (HCC)   . GERD (gastroesophageal reflux disease) 04/25/2015  . Hereditary and idiopathic peripheral  neuropathy 04/25/2015  . Hypertension   . Atrial fibrillation (HCC)   . Anxiety   . Migraine     Social History   Social History  . Marital Status: Single    Spouse Name: N/A  . Number of Children: 0  . Years of Education: 12th   Occupational History  . n/a    Social History Main Topics  . Smoking status: Former Smoker -- 0.20 packs/day    Types: Cigarettes  . Smokeless tobacco: Never Used  . Alcohol Use: 0.6 oz/week    0 Standard drinks or equivalent, 1 Glasses of wine per week     Comment: occasionally  . Drug Use: No  . Sexual Activity: Not on file   Other Topics Concern  . Not on file   Social History Narrative   Patient lives at home alone.   Caffeine use: 1 cup of coffee and 20 oz of soda    Past Surgical History  Procedure Laterality Date  . Wrist surgery    . Abdominal hysterectomy    . Knee surgery    . Hip surgery  Right  . Tee without cardioversion N/A 09/05/2014    Procedure: TRANSESOPHAGEAL ECHOCARDIOGRAM (TEE);  Surgeon: Wendall Stade, MD;  Location: [facility] ENDOSCOPY;  Service: Cardiovascular;  Laterality: N/A;  loop after  . 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  Colon cancer Brother   . Heart disease Mother   . Heart attack Father     Allergies  Allergen Reactions  . Tape Rash    No tape at all    Current Outpatient Prescriptions on File Prior to Visit  Medication Sig Dispense Refill  . acetaminophen (TYLENOL) 500 MG tablet Take 500 mg by mouth every 6 (six) hours as needed for mild pain, moderate pain or headache.    Marland Kitchen amitriptyline (ELAVIL) 25 MG tablet Take 1 tablet (25 mg total) by mouth daily. (Patient taking differently: Take 25 mg by mouth at bedtime. ) 90 tablet 3  . apixaban (ELIQUIS) 5 MG TABS tablet Take 1 tablet (5 mg total) by mouth 2 (two) times daily. 60 tablet 11  . clonazePAM (KLONOPIN) 0.5 MG tablet Take 1 tablet (0.5  mg total) by mouth 2 (two) times daily as needed (anxiety). 60 tablet 0  . clotrimazole-betamethasone (LOTRISONE) cream Apply 1 application topically 2 (two) times daily. 30 g 0  . DULoxetine (CYMBALTA) 30 MG capsule 1 tab po qd for 7 days Then increase to 60 mg daily (to start on 8th day) 60 capsule 0  . fluticasone (FLONASE) 50 MCG/ACT nasal spray Place 2 sprays into both nostrils daily. 16 g 1  . gabapentin (NEURONTIN) 300 MG capsule Take 1 capsule (300 mg total) by mouth 3 (three) times daily. 90 capsule 11  . methocarbamol (ROBAXIN) 500 MG tablet Take 1 tablet (500 mg total) by mouth at bedtime as needed for muscle spasms (take one daily by mouth at bedtime if needed). 30 tablet 0  . NEXIUM 40 MG capsule TAKE 1 CAPSULE (40 MG TOTAL) BY MOUTH DAILY. 30 capsule 2  . omeprazole (PRILOSEC) 20 MG capsule Take 1 capsule (20 mg total) by mouth daily. 30 capsule 1  . rosuvastatin (CRESTOR) 40 MG tablet Take 1 tablet (40 mg total) by mouth daily. 30 tablet 5  . sertraline (ZOLOFT) 50 MG tablet Take 2 tablets (100 mg total) by mouth daily. 30 tablet 3  . traZODone (DESYREL) 100 MG tablet TAKE 1 TABLET (100 MG TOTAL) BY MOUTH AT BEDTIME. 30 tablet 2  . verapamil (CALAN-SR) 120 MG CR tablet Take 1 tablet (120 mg total) by mouth daily. 30 tablet 5   No current facility-administered medications on file prior to visit.    BP 126/78 mmHg  Pulse 71  Temp(Src) 98.1 F (36.7 C) (Oral)  Ht  (1.651 m)  Wt 195 lb 3.2 oz (88.542 kg)  BMI 32.48 kg/m2  SpO2 98%       Objective:   Physical Exam  General Appearance- Not in acute distress.  nek- mild - moderate pain mid cspine   Chest and Lung Exam Auscultation: Breath sounds:-Normal. Clear even and unlabored. Adventitious sounds:- No Adventitious sounds.  Cardiovascular Auscultation:Rythm - Regular, rate and rythm. Heart Sounds -Normal heart sounds.  Abdomen Inspection:-Inspection Normal.  Palpation/Perucssion: Palpation and Percussion  of the abdomen reveal- Non Tender, No Rebound tenderness, No rigidity(Guarding) and No Palpable abdominal masses.  Liver:-Normal.  Spleen:- Normal.   Back Mid lumbar spine tenderness to palpation. Pain on straight leg lift. Pain on lateral movements and flexion/extension of the spine.  Thoracic spine- Midline tenderness to palpation as well  Lower ext neurologic  L5-S1 sensation intact bilaterally. Normal patellar reflexes bilaterally. No foot drop bilaterally.  Upper ext- rt arm grip 3-4/5 compared to lt side 5/5      Assessment & Plan:  Your neck pain and back  pain persists despite nsaid, muscle relaxant and cymbalta. You called and had morphine removed from you allergy history and state you have bee on narcotics such as dermerol and oxycodone in past with no reaction.  Since you have severe pain, I will rx hydrocodone but with understanding that you agreed to take one tablet downstairs and wait 30 minutes there is a tv in area and you could watch.  If you have any sort of reaction such as rash, wheezing, dizzy, light headed shortness of breath, tongue swelling, skin itching(or the like then ED evaluation). After 30 minutes if no symptom then can leave. If any sort of reaction occurs later stop medication and let us know. I have talked with pharmacy downstairs and they are aware of your history and plan.  Follow up in 2 wks or as needed

## 2015-10-03 NOTE — Patient Instructions (Addendum)
Your neck pain and back pain persists despite nsaid, muscle relaxant and cymbalta. You called and had morphine removed from you allergy history and state you have bee on narcotics such as dermerol and oxycodone in past with no reaction.  Since you have severe pain, I will rx hydrocodone but with understanding that you agreed to take one tablet downstairs(by our pharmacy) and wait 30 minutes there is a tv in area that  you could watch.  If you have any sort of reaction such as rash, wheezing, dizzy, light headed, shortness of breath, tongue swelling, skin itching(or the like then ED evaluation). After 30 minutes if no symptom then can leave. If any sort of reaction occurs later stop medication and let us know(ED if necessary as well). I have talked with pharmacy downstairs and they are aware of your history and plan.  Follow up in 2 wks or as needed  Talked with pharmacist. Karl Pocknucenta covered but norco not. Will try to get norco prior authorized. If this fails then pharmacy may be able to give reasonable cash price on norco. Notified pt.

## 2015-10-04 ENCOUNTER — Encounter: Payer: Medicare Other | Admitting: Occupational Therapy

## 2015-10-04 ENCOUNTER — Telehealth: Payer: Self-pay | Admitting: *Deleted

## 2015-10-04 NOTE — Telephone Encounter (Signed)
Initiated, awaiting determination. JG//CMA  

## 2015-10-04 NOTE — Telephone Encounter (Signed)
Approved through 10/04/2015. Approval letter sent for scanning. JG//CMA

## 2015-10-05 ENCOUNTER — Encounter: Payer: Medicare Other | Admitting: Occupational Therapy

## 2015-10-09 ENCOUNTER — Encounter: Payer: Medicare Other | Admitting: Occupational Therapy

## 2015-10-09 NOTE — Telephone Encounter (Signed)
I called and left a message on the patient's answering machine stating that she still needed the nerve conduction EMG study since MRI of the cervical spine did show bilateral foraminal stenosis and EMG is necessary to confirm pinched nerve. She was advised to call me back if she had further questions

## 2015-10-10 ENCOUNTER — Ambulatory Visit: Payer: Medicare Other | Admitting: Medical

## 2015-10-11 ENCOUNTER — Encounter: Payer: Medicare Other | Admitting: Occupational Therapy

## 2015-10-15 ENCOUNTER — Ambulatory Visit (INDEPENDENT_AMBULATORY_CARE_PROVIDER_SITE_OTHER): Payer: Commercial Managed Care - HMO | Admitting: *Deleted

## 2015-10-15 DIAGNOSIS — I639 Cerebral infarction, unspecified: Secondary | ICD-10-CM

## 2015-10-16 ENCOUNTER — Encounter: Payer: Medicare Other | Admitting: Occupational Therapy

## 2015-10-16 NOTE — Progress Notes (Signed)
This encounter was created in error - please disregard.

## 2015-10-16 NOTE — Progress Notes (Signed)
Carelink Summary Report / Loop Recorder 

## 2015-10-17 ENCOUNTER — Encounter: Payer: Commercial Managed Care - HMO | Admitting: Physician Assistant

## 2015-10-17 ENCOUNTER — Encounter: Payer: Medicare Other | Admitting: Nurse Practitioner

## 2015-10-18 ENCOUNTER — Encounter (INDEPENDENT_AMBULATORY_CARE_PROVIDER_SITE_OTHER): Payer: Self-pay | Admitting: Diagnostic Neuroimaging

## 2015-10-18 ENCOUNTER — Encounter: Payer: Medicare Other | Admitting: Occupational Therapy

## 2015-10-18 ENCOUNTER — Ambulatory Visit: Payer: Medicare Other | Admitting: Medical

## 2015-10-18 ENCOUNTER — Ambulatory Visit (INDEPENDENT_AMBULATORY_CARE_PROVIDER_SITE_OTHER): Payer: Commercial Managed Care - HMO | Admitting: Diagnostic Neuroimaging

## 2015-10-18 DIAGNOSIS — M79602 Pain in left arm: Secondary | ICD-10-CM

## 2015-10-18 DIAGNOSIS — Z0289 Encounter for other administrative examinations: Secondary | ICD-10-CM

## 2015-10-18 DIAGNOSIS — R202 Paresthesia of skin: Secondary | ICD-10-CM | POA: Diagnosis not present

## 2015-10-18 DIAGNOSIS — M5010 Cervical disc disorder with radiculopathy, unspecified cervical region: Secondary | ICD-10-CM

## 2015-10-19 ENCOUNTER — Encounter: Payer: Self-pay | Admitting: Medical

## 2015-10-19 ENCOUNTER — Ambulatory Visit (INDEPENDENT_AMBULATORY_CARE_PROVIDER_SITE_OTHER): Payer: Commercial Managed Care - HMO | Admitting: Medical

## 2015-10-19 VITALS — BP 124/80 | HR 78 | Ht 65.0 in | Wt 195.0 lb

## 2015-10-19 DIAGNOSIS — M792 Neuralgia and neuritis, unspecified: Secondary | ICD-10-CM | POA: Diagnosis not present

## 2015-10-19 DIAGNOSIS — M542 Cervicalgia: Secondary | ICD-10-CM

## 2015-10-19 DIAGNOSIS — Z8739 Personal history of other diseases of the musculoskeletal system and connective tissue: Secondary | ICD-10-CM | POA: Diagnosis not present

## 2015-10-19 DIAGNOSIS — M545 Low back pain, unspecified: Secondary | ICD-10-CM

## 2015-10-19 MED ORDER — HYDROCODONE-ACETAMINOPHEN 5-325 MG PO TABS: 1.0000 | ORAL_TABLET | Freq: Four times a day (QID) | ORAL | Status: DC | PRN

## 2015-10-19 MED ORDER — CLONAZEPAM 0.5 MG PO TABS: 0.5000 mg | ORAL_TABLET | Freq: Two times a day (BID) | ORAL | Status: DC | PRN

## 2015-10-19 MED FILL — clonazePAM 0.5 MG TABS: 0.5 | 30 days supply | Qty: 60 | Fill #0

## 2015-10-19 MED FILL — HYDROCODON-APAP 5-325: 5-325 | 8 days supply | Qty: 60 | Fill #0

## 2015-10-19 MED FILL — GABAPENTIN 300 MG CAPSULE: 300 | 30 days supply | Qty: 90 | Fill #0

## 2015-10-19 NOTE — Procedures (Signed)
   [facility]  NCS (NERVE CONDUCTION STUDY) WITH EMG (ELECTROMYOGRAPHY) REPORT   STUDY DATE: 10/19/15 PATIENT NAME: Philipp OvensDenise Andis DOB: 1961/01/04 MRN: [id number]  ORDERING CLINICIAN: Delia HeadyPramod Sethi, MD   TECHNOLOGIST: Gearldine ShownLorraine Jones  ELECTROMYOGRAPHER: Glenford BayleyVikram R. Penumalli, MD  CLINICAL INFORMATION: 55 year old female with left arm pain.  FINDINGS: NERVE CONDUCTION STUDY: Bilateral median and ulnar motor responses and F wave latencies are normal. Bilateral median and ulnar sensory responses are normal.  NEEDLE ELECTROMYOGRAPHY: Needle examination of left upper extremity deltoid, biceps, triceps, flexor carpi radialis, first dorsal interosseous shows no abnormal spontaneous activity at rest and decreased motor unit recruitment in left triceps, left flexor carpi radialis and left first dorsal interosseous muscles. Cervical paraspinal muscles were deferred as patient is on anticoagulation.   IMPRESSION:  Abnormal study demonstrating: 1. 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 2. Normal nerve conduction studies.    INTERPRETING PHYSICIAN:  Suanne MarkerVIKRAM R. PENUMALLI, MD Certified in Neurology, Neurophysiology and Neuroimaging  [facility] [address], [address] [place], [place] [address] [phone]

## 2015-10-19 NOTE — Patient Instructions (Signed)
For neck pain, radicular pain and lower back pain continue current medications. I am refilling your norco today.  I will make the referral to neurologist. Stating I agree with prior order for emg.  Attend neurosurgeon appointment on Monday.  Follow up in 3-4 wks or as needed

## 2015-10-19 NOTE — Telephone Encounter (Signed)
Referral sent to [facility].  

## 2015-10-19 NOTE — Progress Notes (Signed)
Subjective:    Patient ID: Dawn Escobar, female    DOB: Apr 13, 1961, 55 y.o.   MRN: [id number]  HPI   Pt in for follow up. Pt states she had emg yesterday. Study done on her left arm. Pt states some issues regarding insurance.  Dr. Pearlean Brownie referred pt to other provider who did the EMG. Pt states insurance required that we write referral.   The EMG may have showed some muscle damage.   Pt has neck pain and some pain radiating to both arms and to her spine.  I have placed referral to neurosurgeon. She has appointment this coming Monday.  Pt is on gabapentin, cymbalta, and norco for her pain. Pt has not filled any of her rx since she had been told her insurance had changed.  Pt states she has had severe pain despite use of norco over past 3-days.   Review of Systems  Constitutional: Negative for fever, chills and fatigue.  Respiratory: Negative for cough, chest tightness, shortness of breath and wheezing.   Cardiovascular: Negative for chest pain and palpitations.  Gastrointestinal: Negative for abdominal pain.  Musculoskeletal: Positive for back pain and neck pain.  Skin: Negative for rash.  Neurological: Negative for dizziness and headaches.       Some radicular pain to neck.  Hematological: Negative for adenopathy. Does not bruise/bleed easily.  Psychiatric/Behavioral: Negative for behavioral problems.    Past Medical History  Diagnosis Date  . PTSD (post-traumatic stress disorder)   . Raynaud's disease   . Anxiety   . Hypercholesteremia   . Stroke (HCC)   . GERD (gastroesophageal reflux disease) 04/25/2015  . Hereditary and idiopathic peripheral neuropathy 04/25/2015  . Hypertension   . Atrial fibrillation (HCC)   . Anxiety   . Migraine     Social History   Social History  . Marital Status: Single    Spouse Name: N/A  . Number of Children: 0  . Years of Education: 12th   Occupational History  . n/a    Social History Main Topics  . Smoking status: Former  Smoker -- 0.20 packs/day    Types: Cigarettes  . Smokeless tobacco: Never Used  . Alcohol Use: 0.6 oz/week    0 Standard drinks or equivalent, 1 Glasses of wine per week     Comment: occasionally  . Drug Use: No  . Sexual Activity: Not on file   Other Topics Concern  . Not on file   Social History Narrative   Patient lives at home alone.   Caffeine use: 1 cup of coffee and 20 oz of soda    Past Surgical History  Procedure Laterality Date  . Wrist surgery    . Abdominal hysterectomy    . Knee surgery    . Hip surgery  Right  . Tee without cardioversion N/A 09/05/2014    Procedure: TRANSESOPHAGEAL ECHOCARDIOGRAM (TEE);  Surgeon: Wendall Stade, MD;  Location: [facility] ENDOSCOPY;  Service: Cardiovascular;  Laterality: N/A;  loop after  . 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 Colon cancer Brother   . Heart disease Mother   . Heart attack Father     Allergies  Allergen Reactions  . Tape Rash    No tape at all    Current Outpatient Prescriptions on File Prior to Visit  Medication Sig Dispense Refill  . acetaminophen (TYLENOL) 500 MG  tablet Take 500 mg by mouth every 6 (six) hours as needed for mild pain, moderate pain or headache.    Marland Kitchen. amitriptyline (ELAVIL) 25 MG tablet Take 1 tablet (25 mg total) by mouth daily. (Patient taking differently: Take 25 mg by mouth at bedtime. ) 90 tablet 3  . apixaban (ELIQUIS) 5 MG TABS tablet Take 1 tablet (5 mg total) by mouth 2 (two) times daily. 60 tablet 11  . clonazePAM (KLONOPIN) 0.5 MG tablet Take 1 tablet (0.5 mg total) by mouth 2 (two) times daily as needed (anxiety). 60 tablet 0  . clotrimazole-betamethasone (LOTRISONE) cream Apply 1 application topically 2 (two) times daily. 30 g 0  . DULoxetine (CYMBALTA) 30 MG capsule 1 tab po qd for 7 days Then increase to 60 mg daily (to start on 8th day) 60 capsule 0  . fluticasone (FLONASE)  50 MCG/ACT nasal spray Place 2 sprays into both nostrils daily. 16 g 1  . gabapentin (NEURONTIN) 300 MG capsule Take 1 capsule (300 mg total) by mouth 3 (three) times daily. 90 capsule 11  . HYDROcodone-acetaminophen (NORCO) 5-325 MG tablet Take 1 tablet by mouth every 6 (six) hours as needed for moderate pain or severe pain. 45 tablet 0  . methocarbamol (ROBAXIN) 500 MG tablet Take 1 tablet (500 mg total) by mouth at bedtime as needed for muscle spasms (take one daily by mouth at bedtime if needed). 30 tablet 0  . NEXIUM 40 MG capsule TAKE 1 CAPSULE (40 MG TOTAL) BY MOUTH DAILY. 30 capsule 2  . omeprazole (PRILOSEC) 20 MG capsule Take 1 capsule (20 mg total) by mouth daily. 30 capsule 1  . rosuvastatin (CRESTOR) 40 MG tablet Take 1 tablet (40 mg total) by mouth daily. 30 tablet 5  . sertraline (ZOLOFT) 50 MG tablet Take 2 tablets (100 mg total) by mouth daily. 30 tablet 3  . traZODone (DESYREL) 100 MG tablet TAKE 1 TABLET (100 MG TOTAL) BY MOUTH AT BEDTIME. 30 tablet 2  . verapamil (CALAN-SR) 120 MG CR tablet Take 1 tablet (120 mg total) by mouth daily. 30 tablet 5   No current facility-administered medications on file prior to visit.    BP 124/80 mmHg  Pulse 78  Ht 5\' 5"  (1.651 m)  Wt 195 lb (88.451 kg)  BMI 32.45 kg/m2  SpO2 98%       Objective:   Physical Exam  .General Appearance- Not in acute distress.  nek- mild - moderate pain mid cspine   Chest and Lung Exam Auscultation: Breath sounds:-Normal. Clear even and unlabored. Adventitious sounds:- No Adventitious sounds.  Cardiovascular Auscultation:Rythm - Regular, rate and rythm. Heart Sounds -Normal heart sounds.  Abdomen Inspection:-Inspection Normal.  Palpation/Perucssion: Palpation and Percussion of the abdomen reveal- Non Tender, No Rebound tenderness, No rigidity(Guarding) and No Palpable abdominal masses.  Liver:-Normal.  Spleen:- Normal.   Back Mid lumbar spine tenderness to palpation. Pain on straight  leg lift. Pain on lateral movements and flexion/extension of the spine.  Thoracic spine- Midline tenderness to palpation as well  Lower ext neurologic  L5-S1 sensation intact bilaterally. Normal patellar reflexes bilaterally. No foot drop bilaterally.  Upper ext- rt arm grip 3-4/5 compared to lt side 5/5      Assessment & Plan:  For neck pain, radicular pain and lower back pain continue current medications. I am refilling your norco today.(I did give more tabs in case she had to take 2 tabs on occasion for severe pain)  I will make the referral to neurologist.  Stating I agree with prior order for emg.  Attend neurosurgeon appointment on Monday.  Follow up in 3-4 wks or as needed

## 2015-10-19 NOTE — Progress Notes (Signed)
Pre visit review using our clinic review tool, if applicable. No additional management support is needed unless otherwise documented below in the visit note. 

## 2015-10-19 NOTE — Telephone Encounter (Signed)
See referral

## 2015-10-22 ENCOUNTER — Telehealth: Payer: Self-pay | Admitting: Neurology

## 2015-10-22 ENCOUNTER — Telehealth: Payer: Self-pay | Admitting: *Deleted

## 2015-10-22 ENCOUNTER — Other Ambulatory Visit: Payer: Self-pay | Admitting: *Deleted

## 2015-10-22 ENCOUNTER — Telehealth: Payer: Self-pay | Admitting: Internal Medicine

## 2015-10-22 ENCOUNTER — Telehealth: Payer: Self-pay | Admitting: Medical

## 2015-10-22 MED ORDER — SERTRALINE HCL 50 MG PO TABS
100.0000 mg | ORAL_TABLET | Freq: Every day | ORAL | Status: DC
Start: 2015-10-22 — End: 2016-02-14

## 2015-10-22 MED ORDER — AMITRIPTYLINE HCL 25 MG PO TABS: 25.0000 mg | ORAL_TABLET | Freq: Every day | ORAL | Status: DC

## 2015-10-22 MED ORDER — APIXABAN 5 MG PO TABS: 5.0000 mg | ORAL_TABLET | Freq: Two times a day (BID) | ORAL | Status: DC

## 2015-10-22 MED ORDER — DULOXETINE HCL 60 MG PO CPEP: 60.0000 mg | ORAL_CAPSULE | Freq: Every day | ORAL | Status: DC

## 2015-10-22 MED FILL — DULoxetine HCL 60 MG CPEP: 60 | 30 days supply | Qty: 30 | Fill #0

## 2015-10-22 MED FILL — VERAPAMIL ER 120 MG TABLET: 120 | 30 days supply | Qty: 30 | Fill #0

## 2015-10-22 MED FILL — METHOCARBAMOL 500 MG TABLET: 500 | 15 days supply | Qty: 15 | Fill #0

## 2015-10-22 MED FILL — AMITRIPTYLINE HCL 25 MG TAB: 25 | 90 days supply | Qty: 90 | Fill #0

## 2015-10-22 MED FILL — SERTRALINE HCL 50 MG TABLET: 50 | 30 days supply | Qty: 60 | Fill #0

## 2015-10-22 MED FILL — OMEPRAZOLE DR 20 MG CAPSULE: 20 | 30 days supply | Qty: 30 | Fill #0

## 2015-10-22 MED FILL — ROSUVASTATIN CALCIUM 40 MG: 40 | 30 days supply | Qty: 30 | Fill #0

## 2015-10-22 MED FILL — ELIQUIS 5 MG TABLET: 5 | 45 days supply | Qty: 90 | Fill #0

## 2015-10-22 NOTE — Telephone Encounter (Signed)
Received call from Dawn [facility] stating pt received Nexium 40mg  previously and recently filled omeprazole. She wanted to know if we were aware pt was taking both and which one is pt supposed to be taking?

## 2015-10-22 NOTE — Telephone Encounter (Signed)
Caller name: Phoebe SharpsKiana at [facility]  Reason for call: Pt came in requesting fill on duloxetine. Last filled at different pharmacy and pt had no information or dosing to advise. Please send new rx or contact pt.

## 2015-10-22 NOTE — Telephone Encounter (Signed)
[facility] [phone] called regarding sertraline (ZOLOFT) 50 MG tablet, states Rx is for 30 tablets to be taken 2 tablets per day, wants to know if Rx can be changed to 60 tablets otherwise patient will run out before the end of the month.

## 2015-10-22 NOTE — Telephone Encounter (Signed)
Will you call pt tomorrow. Pt has prilosec and nexium on her med list. Would you clarify if she is taking both. I thought I remember pt telling me that omeprazole worked better than nexium. She should only take one. Whichever one she feels works better recommend taking that one and stopping the other. DC the one she thinks did not help. Thanks. Let me know what she says.

## 2015-10-22 NOTE — Telephone Encounter (Signed)
Dawn Escobar would you like me to use the previous sig of take 1 tablet of 30 mg for 7 days and increase to 60mg  after 8 days. Please advise. Pt would like it sent to our pharmacy.

## 2015-10-22 NOTE — Telephone Encounter (Signed)
Refilled her duloxetine. Sent to her pharmacy.

## 2015-10-22 NOTE — Telephone Encounter (Signed)
New message       *STAT* If patient is at the pharmacy, call can be transferred to refill team.   1. Which medications need to be refilled? (please list name of each medication and dose if known) eliquis  5mg  2. Which pharmacy/location (including street and city if local pharmacy) is medication to be sent to[facility]  3. Do they need a 30 day or 90 day supply? 90 day supply

## 2015-10-22 NOTE — Telephone Encounter (Signed)
Kiana[facility] [phone] called to request Rx for amitriptyline (ELAVIL) 25 MG tablet

## 2015-10-23 ENCOUNTER — Telehealth: Payer: Self-pay | Admitting: *Deleted

## 2015-10-23 MED FILL — GABAPENTIN 100 MG CAPSULE: 100 | 30 days supply | Qty: 90 | Fill #0

## 2015-10-23 MED FILL — FLUTICASONE PROP 50 MCG SPR: 50 | 30 days supply | Qty: 16 | Fill #0

## 2015-10-23 MED FILL — AMLODIPINE BESYLATE 5 MG TA: 5 | 30 days supply | Qty: 30 | Fill #0

## 2015-10-23 NOTE — Telephone Encounter (Signed)
Pt states that she has been taking the prilosec and the nexium both. She is willing to try the omeprazole instead of taking both.

## 2015-10-23 NOTE — Telephone Encounter (Signed)
Medication was taken off list on 10/23/15-HSM

## 2015-10-23 NOTE — Telephone Encounter (Signed)
Would you take nexium of her list and notify her pharmacy.

## 2015-10-23 NOTE — Telephone Encounter (Signed)
Left message for pt to call back to clarify medications on current list.

## 2015-10-23 NOTE — Telephone Encounter (Signed)
Received PA request from pharmacy, completed PA and received message from insurance that a PA is not required. Pharmacy aware. JG//CMA

## 2015-10-24 ENCOUNTER — Telehealth: Payer: Self-pay | Admitting: Medical

## 2015-10-24 DIAGNOSIS — Z6832 Body mass index (BMI) 32.0-32.9, adult: Secondary | ICD-10-CM | POA: Diagnosis not present

## 2015-10-24 DIAGNOSIS — M4722 Other spondylosis with radiculopathy, cervical region: Secondary | ICD-10-CM | POA: Diagnosis not present

## 2015-10-24 MED FILL — traZODone HCL 100 MG TABS: 100 | 30 days supply | Qty: 30 | Fill #0

## 2015-10-24 NOTE — Telephone Encounter (Signed)
°  Relation to RU:EAVW Call back number:[phone] Pharmacy:  Reason for call: pt would like to speak with you regarding the appointment she had with the neuro surgent doctor, Dr. Conchita Paris pt states she wants a second opinion if possible states she was just not comfortable because the doctor never offered to do any xrays or MRI just informed her that it was something going on with her neck and that he want to just start the injections first she says if he does not know what is wrong why just give her injections

## 2015-10-25 NOTE — Telephone Encounter (Signed)
Spoke with pt and she states that she is taking 2 tablets of the Norco 5-325 with no relief. Pt was advised per PCP to come in for an office visit on 10/26/15 for further evaluation. Pt states that she was not happy and would like a second opinion on the neck pain.

## 2015-10-25 NOTE — Telephone Encounter (Signed)
Patient called back and I informed her of the message below. She said she is interested in being referred somewhere else and wanted me to inform you she is in a lot of patient even after taking the pain medication she tosses and turns all night because her pain level is so high.  She said she doesn't see you until feb and wants to know is there anything that can be done about her pain

## 2015-10-25 NOTE — Telephone Encounter (Signed)
I called pt this am  but no answer. Please let her know that this is my long day so I called very early since typically very busy until very late. Let her know I did see message about Dr. Conchita Paris. Sorry she had that experience. Will refer out to other. Send me back note to remind me to refer.

## 2015-10-26 ENCOUNTER — Ambulatory Visit (INDEPENDENT_AMBULATORY_CARE_PROVIDER_SITE_OTHER): Payer: Commercial Managed Care - HMO | Admitting: Medical

## 2015-10-26 ENCOUNTER — Encounter: Payer: Self-pay | Admitting: Medical

## 2015-10-26 VITALS — BP 124/76 | HR 78 | Temp 98.1°F | Ht 65.0 in | Wt 196.2 lb

## 2015-10-26 DIAGNOSIS — M792 Neuralgia and neuritis, unspecified: Secondary | ICD-10-CM

## 2015-10-26 DIAGNOSIS — M4802 Spinal stenosis, cervical region: Secondary | ICD-10-CM

## 2015-10-26 DIAGNOSIS — Z8739 Personal history of other diseases of the musculoskeletal system and connective tissue: Secondary | ICD-10-CM | POA: Diagnosis not present

## 2015-10-26 DIAGNOSIS — M5440 Lumbago with sciatica, unspecified side: Secondary | ICD-10-CM | POA: Diagnosis not present

## 2015-10-26 MED ORDER — OXYCODONE-ACETAMINOPHEN 5-325 MG PO TABS: ORAL_TABLET | ORAL | Status: DC

## 2015-10-26 MED ORDER — KETOROLAC TROMETHAMINE 60 MG/2ML IM SOLN
60.0000 mg | Freq: Once | INTRAMUSCULAR | Status: AC
  Administered 2015-10-26: 60 mg via INTRAMUSCULAR

## 2015-10-26 MED FILL — OXYCODONE/APAP 5-325: 5-325 | 20 days supply | Qty: 60 | Fill #0

## 2015-10-26 NOTE — Progress Notes (Signed)
Pre visit review using our clinic review tool, if applicable. No additional management support is needed unless otherwise documented below in the visit note. 

## 2015-10-26 NOTE — Progress Notes (Signed)
Subjective:    Patient ID: Dawn Escobar, female    DOB: 12-07-1960, 55 y.o.   MRN: [id number]  HPI   Pt in for follow up. Pt states she did not like neurosurgeon. He only wanted to trigger point injections. Pt stats specialist did not think any studies indicated. Pt has appointment scheduled with pre-procedure consult on  January 30 th.  Pt still has neck pain, some lt  shoulder pain associated at time with neck pain. Also some pain radiating to her thoracic and lumbar area.  Pt is taking hydrocodone 2 tab every 6 hours due to pain. But pain is not controlled. States this stopped working about 5 days ago.  Pt does appears to be in pain and crying.   Pt stats pain in her lower back is as severe as her neck pain. Pain shooting down her left leg. On sides of her legs/thighs  she feels numbness at itmes. No incontinence. Radiating pain to left leg for one week.  Pt pain level 9/10 even with 2 hydrocodone. Gives good pain control for only one hour. Then pain is severe     Review of Systems  Constitutional: Negative for fever, chills and fatigue.  Respiratory: Negative for chest tightness, shortness of breath and wheezing.   Cardiovascular: Negative for chest pain and palpitations.  Gastrointestinal: Negative for abdominal pain and blood in stool.  Musculoskeletal: Positive for back pain and neck pain.  Neurological:       Radiating pain to left arm from neck. And some pain radiating to both legs from lumbar spine. But worse left side.  Hematological: Negative for adenopathy.  Psychiatric/Behavioral: Negative for behavioral problems and confusion.   Past Medical History  Diagnosis Date  . PTSD (post-traumatic stress disorder)   . Raynaud's disease   . Anxiety   . Hypercholesteremia   . Stroke (HCC)   . GERD (gastroesophageal reflux disease) 04/25/2015  . Hereditary and idiopathic peripheral neuropathy 04/25/2015  . Hypertension   . Atrial fibrillation (HCC)   . Anxiety   .  Migraine     Social History   Social History  . Marital Status: Single    Spouse Name: N/A  . Number of Children: 0  . Years of Education: 12th   Occupational History  . n/a    Social History Main Topics  . Smoking status: Former Smoker -- 0.20 packs/day    Types: Cigarettes  . Smokeless tobacco: Never Used  . Alcohol Use: 0.6 oz/week    0 Standard drinks or equivalent, 1 Glasses of wine per week     Comment: occasionally  . Drug Use: No  . Sexual Activity: Not on file   Other Topics Concern  . Not on file   Social History Narrative   Patient lives at home alone.   Caffeine use: 1 cup of coffee and 20 oz of soda    Past Surgical History  Procedure Laterality Date  . Wrist surgery    . Abdominal hysterectomy    . Knee surgery    . Hip surgery  Right  . Tee without cardioversion N/A 09/05/2014    Procedure: TRANSESOPHAGEAL ECHOCARDIOGRAM (TEE);  Surgeon: Wendall Stade, MD;  Location: [facility] ENDOSCOPY;  Service: Cardiovascular;  Laterality: N/A;  loop after  . 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 Colon cancer Brother   . Heart  disease Mother   . Heart attack Father     Allergies  Allergen Reactions  . Tape Rash    No tape at all    Current Outpatient Prescriptions on File Prior to Visit  Medication Sig Dispense Refill  . acetaminophen (TYLENOL) 500 MG tablet Take 500 mg by mouth every 6 (six) hours as needed for mild pain, moderate pain or headache.    Marland Kitchen amitriptyline (ELAVIL) 25 MG tablet Take 1 tablet (25 mg total) by mouth daily. 90 tablet 0  . apixaban (ELIQUIS) 5 MG TABS tablet Take 1 tablet (5 mg total) by mouth 2 (two) times daily. 90 tablet 1  . clonazePAM (KLONOPIN) 0.5 MG tablet Take 1 tablet (0.5 mg total) by mouth 2 (two) times daily as needed (anxiety). 60 tablet 0  . clotrimazole-betamethasone (LOTRISONE) cream Apply 1 application topically  2 (two) times daily. 30 g 0  . DULoxetine (CYMBALTA) 60 MG capsule Take 1 capsule (60 mg total) by mouth daily. 30 capsule 3  . fluticasone (FLONASE) 50 MCG/ACT nasal spray Place 2 sprays into both nostrils daily. 16 g 1  . gabapentin (NEURONTIN) 300 MG capsule Take 1 capsule (300 mg total) by mouth 3 (three) times daily. 90 capsule 11  . methocarbamol (ROBAXIN) 500 MG tablet Take 1 tablet (500 mg total) by mouth at bedtime as needed for muscle spasms (take one daily by mouth at bedtime if needed). 30 tablet 0  . omeprazole (PRILOSEC) 20 MG capsule Take 1 capsule (20 mg total) by mouth daily. 30 capsule 1  . rosuvastatin (CRESTOR) 40 MG tablet Take 1 tablet (40 mg total) by mouth daily. 30 tablet 5  . sertraline (ZOLOFT) 50 MG tablet Take 2 tablets (100 mg total) by mouth daily. 60 tablet 3  . traZODone (DESYREL) 100 MG tablet TAKE 1 TABLET (100 MG TOTAL) BY MOUTH AT BEDTIME. 30 tablet 2  . verapamil (CALAN-SR) 120 MG CR tablet Take 1 tablet (120 mg total) by mouth daily. 30 tablet 5   No current facility-administered medications on file prior to visit.    BP 124/76 mmHg  Pulse 78  Temp(Src) 98.1 F (36.7 C) (Oral)  Ht  (1.651 m)  Wt 196 lb 3.2 oz (88.996 kg)  BMI 32.65 kg/m2  SpO2 97%       Objective:   Physical Exam  General Appearance- Not in acute distress. But some crying due to pain and fidgets as if in pain.  nek- mild - moderate pain mid cspine   Chest and Lung Exam Auscultation: Breath sounds:-Normal. Clear even and unlabored. Adventitious sounds:- No Adventitious sounds.  Cardiovascular Auscultation:Rythm - Regular, rate and rythm. Heart Sounds -Normal heart sounds.  Abdomen Inspection:-Inspection Normal.  Palpation/Perucssion: Palpation and Percussion of the abdomen reveal- Non Tender, No Rebound tenderness, No rigidity(Guarding) and No Palpable abdominal masses.  Liver:-Normal.  Spleen:- Normal.   Back Mid lumbar spine tenderness to  palpation. Pain on straight leg lift. Pain on lateral movements and flexion/extension of the spine.  Thoracic spine- Midline tenderness to palpation as well  Lower ext neurologic  L5-S1 sensation intact bilaterally. Normal patellar reflexes bilaterally. No foot drop bilaterally. Rt leg strength 4/5(but has been case since her stroke)  Upper ext- rt arm grip 3-4/5 compared to lt side 5/5      Assessment & Plan:  For your neck pain,  You have appointment with specialist for likely epidural. I ask you sign release form so I can review specialist note.   For  your severe level of pain unresponsive to hydrocodone. Will stop hydrocodone and prescribe oxycodone. Explained not to take both extensively. Explained why. Pt agreed not to take both and will put hydrocodone in separate area.  Will order mri of lumbar spine today as well.   Follow up in 2-3 weeks or as needed.  Pt signed controlled med contract and will give UDS today.

## 2015-10-26 NOTE — Patient Instructions (Addendum)
For your neck pain,  You have appointment with specialist for likely epidural. I ask you sign release form so I can review specialist note.   For your severe level of pain unresponsive to hydrocodone. Will stop hydrocodone and prescribe oxycodone. Explained not to take both extensively. Explained why. Pt agreed not to take both and will put hydrocodone in separate area.  Will order mri of lumbar spine today as well.   Follow up in 2-3 weeks or as needed.  Pt signed controlled med contract and will give UDS today.

## 2015-10-29 ENCOUNTER — Telehealth: Payer: Self-pay | Admitting: Pharmacist

## 2015-10-29 NOTE — Telephone Encounter (Signed)
Received fax from [facility] for request to hold Eliquis for Spinal injection. Clearance given per protocol and form faxed 10/29/15.

## 2015-11-02 ENCOUNTER — Encounter: Payer: Self-pay | Admitting: Rehabilitation

## 2015-11-02 ENCOUNTER — Other Ambulatory Visit: Payer: Self-pay | Admitting: Medical

## 2015-11-02 ENCOUNTER — Ambulatory Visit (INDEPENDENT_AMBULATORY_CARE_PROVIDER_SITE_OTHER): Payer: Commercial Managed Care - HMO | Admitting: Medical

## 2015-11-02 ENCOUNTER — Encounter: Payer: Self-pay | Admitting: Medical

## 2015-11-02 ENCOUNTER — Ambulatory Visit (HOSPITAL_BASED_OUTPATIENT_CLINIC_OR_DEPARTMENT_OTHER)
Admission: RE | Admit: 2015-11-02 | Discharge: 2015-11-02 | Disposition: A | Discharge status: Home / Self Care | Payer: Commercial Managed Care - HMO | Source: Ambulatory Visit | Attending: Medical | Admitting: Medical

## 2015-11-02 ENCOUNTER — Ambulatory Visit (HOSPITAL_COMMUNITY): Admission: RE | Admit: 2015-11-02 | Payer: Commercial Managed Care - HMO | Source: Ambulatory Visit

## 2015-11-02 VITALS — BP 120/72 | HR 76 | Temp 98.1°F | Ht 65.0 in | Wt 197.0 lb

## 2015-11-02 DIAGNOSIS — M79604 Pain in right leg: Secondary | ICD-10-CM | POA: Diagnosis present

## 2015-11-02 DIAGNOSIS — M7989 Other specified soft tissue disorders: Secondary | ICD-10-CM | POA: Diagnosis not present

## 2015-11-02 DIAGNOSIS — M25551 Pain in right hip: Secondary | ICD-10-CM | POA: Diagnosis not present

## 2015-11-02 DIAGNOSIS — M542 Cervicalgia: Secondary | ICD-10-CM

## 2015-11-02 DIAGNOSIS — T148XXA Other injury of unspecified body region, initial encounter: Secondary | ICD-10-CM

## 2015-11-02 DIAGNOSIS — M79662 Pain in left lower leg: Secondary | ICD-10-CM

## 2015-11-02 DIAGNOSIS — T148 Other injury of unspecified body region: Secondary | ICD-10-CM | POA: Diagnosis not present

## 2015-11-02 LAB — CBC WITH DIFFERENTIAL/PLATELET
BASOS PCT: 0 % (ref 0–1)
Basophils Absolute: 0 10*3/uL (ref 0.0–0.1)
EOS ABS: 0.2 10*3/uL (ref 0.0–0.7)
Eosinophils Relative: 2 % (ref 0–5)
HCT: 40.9 % (ref 36.0–46.0)
HEMOGLOBIN: 13.7 g/dL (ref 12.0–15.0)
LYMPHS ABS: 1.8 10*3/uL (ref 0.7–4.0)
Lymphocytes Relative: 21 % (ref 12–46)
MCH: 31.1 pg (ref 26.0–34.0)
MCHC: 33.5 g/dL (ref 30.0–36.0)
MCV: 92.7 fL (ref 78.0–100.0)
MONO ABS: 0.6 10*3/uL (ref 0.1–1.0)
MONOS PCT: 7 % (ref 3–12)
MPV: 9.3 fL (ref 8.6–12.4)
NEUTROS ABS: 6.1 10*3/uL (ref 1.7–7.7)
Neutrophils Relative %: 70 % (ref 43–77)
Platelets: 175 10*3/uL (ref 150–400)
RBC: 4.41 MIL/uL (ref 3.87–5.11)
RDW: 13.9 % (ref 11.5–15.5)
WBC: 8.7 10*3/uL (ref 4.0–10.5)

## 2015-11-02 MED ORDER — METHOCARBAMOL 500 MG PO TABS: 500.0000 mg | ORAL_TABLET | Freq: Every evening | ORAL | Status: DC | PRN

## 2015-11-02 NOTE — Therapy (Signed)
[facility] [address] [place], [place], [address] Phone: [phone]   Fax:  [phone]  Patient Details  Name: Dawn Escobar MRN: [id number] Date of Birth: 1961-08-21 Referring Provider:  No ref. provider found  Encounter Date: 11/02/2015    PHYSICAL THERAPY DISCHARGE SUMMARY  Visits from Start of Care: 10  Current functional level related to goals / functional outcomes:     PT Long Term Goals - 07/31/15 1843    PT LONG TERM GOAL #1   Title Pt will be independent with HEP for strengthening and balance in order to indicate decreased fall risk and improved functional mobility. (Target Date: 09/25/15)   Time 8   Period Weeks   PT LONG TERM GOAL #2   Title Pt will increase R hip strength to 4/5 to indicate increased strength and improved functional mobility.   (Target Date: 09/25/15)   Time 8   Period Weeks   PT LONG TERM GOAL #3   Title Pt will initiate participation in community fitness program to continue to improve functional mobility beyond therapy.   (Target Date: 09/25/15)   Time 8   Period Weeks   PT LONG TERM GOAL #4   Title Pt will ambulate 500' without AD over outdoor surfaces (paved, grass, curb, etc) at mod I level to indicate safe return to community.   (Target Date: 09/25/15)   Time 8   PT LONG TERM GOAL #5   Title Pt will increase 6MWT by 150' to indicate increase in functional endurance.   (Target Date: 09/25/15)         Remaining deficits: Unsure as she did not return for follow up visits as neuro MD stated not to return.    Education / Equipment: HEP  Plan: Patient agrees to discharge.  Patient goals were not met. Patient is being discharged due to not returning since the last visit.  ?????        Cameron Sprang, PT, MPT [facility] [address] [place], [place], [address] Phone: [phone]   Fax:  [phone] 11/02/2015, 9:48  AM

## 2015-11-02 NOTE — Progress Notes (Signed)
Subjective:    Patient ID: Dawn Escobar, female    DOB: [birth year], 55 y.o.   MRN: [id number]  HPI  Pt in for left leg swelling.She states over a week. No trauma or fall. Mild at first but now getting worse. No obvious shortness of breath. Then she speculates maybe some with her severe lower back pain. Left lower ext symptoms  for one week.  Also pt has some mild bruise to her rt thigh medial aspect which she saw 2 days ago. Then noticed some mild left lower ext bruising as well.  Pt is still having pain in her neck, left arm and lower back.  Pt is on cymbalta, neurontin, robaxin, and roxicet.   I reviewed pt med list and I don't see current nsaid.  Occasional with neck pain will get transient brief front ha. But no associated neurologic signs or symptoms.  Appointment with Dr. Pearlean Brownie appointment is in February.  I wrote order for mri of the neck but authroization is pending.      Review of Systems  Constitutional: Negative for fever, chills and fatigue.  Respiratory: Negative for cough, choking, shortness of breath and wheezing.   Cardiovascular: Negative for chest pain and palpitations.  Musculoskeletal: Positive for back pain and neck pain.       See hpi  Hematological: Negative for adenopathy. Does not bruise/bleed easily.   Past Medical History  Diagnosis Date  . PTSD (post-traumatic stress disorder)   . Raynaud's disease   . Anxiety   . Hypercholesteremia   . Stroke (HCC)   . GERD (gastroesophageal reflux disease) 04/25/2015  . Hereditary and idiopathic peripheral neuropathy 04/25/2015  . Hypertension   . Atrial fibrillation (HCC)   . Anxiety   . Migraine     Social History   Social History  . Marital Status: Single    Spouse Name: N/A  . Number of Children: 0  . Years of Education: 12th   Occupational History  . n/a    Social History Main Topics  . Smoking status: Former Smoker -- 0.20 packs/day    Types: Cigarettes  . Smokeless tobacco: Never Used   . Alcohol Use: 0.6 oz/week    0 Standard drinks or equivalent, 1 Glasses of wine per week     Comment: occasionally  . Drug Use: No  . Sexual Activity: Not on file   Other Topics Concern  . Not on file   Social History Narrative   Patient lives at home alone.   Caffeine use: 1 cup of coffee and 20 oz of soda    Past Surgical History  Procedure Laterality Date  . Wrist surgery    . Abdominal hysterectomy    . Knee surgery    . Hip surgery  Right  . Tee without cardioversion N/A 09/05/2014    Procedure: TRANSESOPHAGEAL ECHOCARDIOGRAM (TEE);  Surgeon: Wendall Stade, MD;  Location: [facility]COPY;  Service: Cardiovascular;  Laterality: N/A;  loop after  . 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 Colon cancer Brother   . Heart disease Mother   . Heart attack Father     Allergies  Allergen Reactions  . Tape Rash    No tape at all    Current Outpatient Prescriptions on File Prior to Visit  Medication Sig Dispense Refill  . acetaminophen (TYLENOL) 500 MG tablet Take 500  mg by mouth every 6 (six) hours as needed for mild pain, moderate pain or headache.    Marland Kitchen amitriptyline (ELAVIL) 25 MG tablet Take 1 tablet (25 mg total) by mouth daily. 90 tablet 0  . apixaban (ELIQUIS) 5 MG TABS tablet Take 1 tablet (5 mg total) by mouth 2 (two) times daily. 90 tablet 1  . clonazePAM (KLONOPIN) 0.5 MG tablet Take 1 tablet (0.5 mg total) by mouth 2 (two) times daily as needed (anxiety). 60 tablet 0  . clotrimazole-betamethasone (LOTRISONE) cream Apply 1 application topically 2 (two) times daily. 30 g 0  . DULoxetine (CYMBALTA) 60 MG capsule Take 1 capsule (60 mg total) by mouth daily. 30 capsule 3  . fluticasone (FLONASE) 50 MCG/ACT nasal spray Place 2 sprays into both nostrils daily. 16 g 1  . gabapentin (NEURONTIN) 300 MG capsule Take 1 capsule (300 mg total) by mouth 3 (three) times  daily. 90 capsule 11  . omeprazole (PRILOSEC) 20 MG capsule Take 1 capsule (20 mg total) by mouth daily. 30 capsule 1  . oxyCODONE-acetaminophen (ROXICET) 5-325 MG tablet 1 tab po every 8-12 hours as needed severe pain 60 tablet 0  . rosuvastatin (CRESTOR) 40 MG tablet Take 1 tablet (40 mg total) by mouth daily. 30 tablet 5  . sertraline (ZOLOFT) 50 MG tablet Take 2 tablets (100 mg total) by mouth daily. 60 tablet 3  . traZODone (DESYREL) 100 MG tablet TAKE 1 TABLET (100 MG TOTAL) BY MOUTH AT BEDTIME. 30 tablet 2  . verapamil (CALAN-SR) 120 MG CR tablet Take 1 tablet (120 mg total) by mouth daily. 30 tablet 5   No current facility-administered medications on file prior to visit.    BP 120/72 mmHg  Pulse 76  Temp(Src) 98.1 F (36.7 C) (Oral)  Ht  (1.651 m)  Wt 197 lb (89.359 kg)  BMI 32.78 kg/m2  SpO2 97%           Objective:   Physical Exam  General Mental Status- Alert. General Appearance- Not in acute distress.   Skin General: Color- Normal Color. Moisture- Normal Moisture.  Neck Carotid Arteries- Normal color. Moisture- Normal Moisture. No carotid bruits. No JVD.  Chest and Lung Exam Auscultation: Breath Sounds:-Normal.  Cardiovascular Auscultation:Rythm- Regular. Murmurs & Other Heart Sounds:Auscultation of the heart reveals- No Murmurs.  Abdomen Inspection:-Inspeection Normal. Palpation/Percussion:Note:No mass. Palpation and Percussion of the abdomen reveal- Non Tender, Non Distended + BS, no rebound or guarding.    Neurologic Cranial Nerve exam:- CN III-XII intact(No nystagmus), symmetric smile. Strength:- 5/5 equal and symmetric strength both upper and lower extremities.  Lt lower ext- mild calf swelling and mild tendeness. Faint popliteal pain on palation.  Rt lower ext- no calf pain or popliteal pain. Faint tender medial distal thigh tenderness and mild bruising      Assessment & Plan:  For your neck pain and other regions of pain continue  current regimen. I did fill your muscle relaxant.  I will refer to Dr. Pearlean Brownie for [place] appointment and try to expidite the appointment. Get name of neurosurgeon he would refer to. Then place referral.  [place] order placed today. Results will be sent to me stat. Stay in radiology until I am notified of result and we discuss.  Get cbc today.  Follow up date to be determined after labs back and after investigate referral timeframe.

## 2015-11-02 NOTE — Progress Notes (Signed)
Pre visit review using our clinic review tool, if applicable. No additional management support is needed unless otherwise documented below in the visit note. 

## 2015-11-02 NOTE — Patient Instructions (Addendum)
For your neck pain and other regions of pain continue current regimen. I did fill your muscle relaxant.  I will refer to Dr. Pearlean Brownie for [place] appointment and try to expidite the appointment. Get name of neurosurgeon he would refer to. Then place referral.  [place] order placed today. Results will be sent to me stat. Stay in radiology until I am notified of result and we discuss.  Get cbc today.  Follow up date to be determined after labs back and after investigate referral timeframe.

## 2015-11-02 NOTE — Telephone Encounter (Signed)
Pt notified of neg [place] of legs by radiology.

## 2015-11-03 LAB — CUP PACEART REMOTE DEVICE CHECK: Date Time Interrogation Session: 20161210183646

## 2015-11-05 NOTE — Telephone Encounter (Signed)
Patient would like for you to call her tomorrow

## 2015-11-05 NOTE — Progress Notes (Signed)
Quick Note:  Pt has seen results on MyChart and message also sent for patient to call back if any questions. ______ 

## 2015-11-06 ENCOUNTER — Telehealth: Payer: Self-pay | Admitting: *Deleted

## 2015-11-06 MED FILL — METHOCARBAMOL 500 MG TABLET: 500 | 15 days supply | Qty: 15 | Fill #1

## 2015-11-06 NOTE — Telephone Encounter (Signed)
Spoke with pt and appointment cancelled per PCP due to MRI not being completed before appointment. Pt is aware and will reschedule after MRI. Pt was also advised that we are waiting on Dr. Pearlean Brownie to recommend a second opinion for neurology.

## 2015-11-06 NOTE — Telephone Encounter (Addendum)
Received Medical Records from [facility]; forwarded to provider/SLS 01/31

## 2015-11-07 ENCOUNTER — Telehealth: Payer: Self-pay | Admitting: Internal Medicine

## 2015-11-07 NOTE — Telephone Encounter (Signed)
New Message:  Pt called in stating that she has been having some problems with bruises popping up on her legs and her lft leg swelling. After having some test ran by her PCP, he feels that her dosage of Eliquis may need to be adjusted. Please f/u with pt

## 2015-11-07 NOTE — Telephone Encounter (Signed)
Poke with patient and let her know she needs to continue the medication and bring in her labs and ultrasound report to her visit on 11/13/15.  She dan discuss her concerns in regards to bruising on the Eliquis at her appointment.  She will get reprts and bring with her.

## 2015-11-09 ENCOUNTER — Ambulatory Visit: Payer: Commercial Managed Care - HMO | Admitting: Medical

## 2015-11-10 ENCOUNTER — Ambulatory Visit (HOSPITAL_BASED_OUTPATIENT_CLINIC_OR_DEPARTMENT_OTHER)
Admission: RE | Admit: 2015-11-10 | Discharge: 2015-11-10 | Disposition: A | Discharge status: Home / Self Care | Payer: Commercial Managed Care - HMO | Source: Ambulatory Visit | Attending: Medical | Admitting: Medical

## 2015-11-10 DIAGNOSIS — M4806 Spinal stenosis, lumbar region: Secondary | ICD-10-CM | POA: Insufficient documentation

## 2015-11-10 DIAGNOSIS — M4804 Spinal stenosis, thoracic region: Secondary | ICD-10-CM | POA: Diagnosis not present

## 2015-11-10 DIAGNOSIS — R200 Anesthesia of skin: Secondary | ICD-10-CM | POA: Diagnosis not present

## 2015-11-10 DIAGNOSIS — M5124 Other intervertebral disc displacement, thoracic region: Secondary | ICD-10-CM | POA: Insufficient documentation

## 2015-11-10 DIAGNOSIS — M5440 Lumbago with sciatica, unspecified side: Secondary | ICD-10-CM | POA: Diagnosis not present

## 2015-11-12 ENCOUNTER — Telehealth: Payer: Self-pay

## 2015-11-12 ENCOUNTER — Telehealth: Payer: Self-pay | Admitting: Medical

## 2015-11-12 NOTE — Telephone Encounter (Signed)
done

## 2015-11-12 NOTE — Progress Notes (Signed)
Electrophysiology Office Note Date: 11/13/2015  ID:  Dawn Escobar, DOB 21-Dec-1960, MRN [id number]  PCP: Esperanza Richters, PA-C Electrophysiologist: Allred  CC: Atrial fibrillation follow up,  recently noting easy brusing on her legs particularly.  Dawn Escobar is a 55 y.o. female seen today for routine electrophysiology followup.  She was last seen by Gypsy Balsam, ARNP for EP Service in October 2016 doing well, her amlodipine was changed to verapamil for SVT.   She has PMHx of PSVT, paroxysmal AFib, HLD, HTN, GERD, anxiety, she has an ILR in place after stroke. She comes in today feeling well from a cardiac standpoint.  She denies any CP, palpitations or SOB.  No dizziness, nar syncope or syncope.    Unfortunately she is suffering with severechronic back and neck pain seeing her neurologist and PMD routinely, pending referral to neurosurgery for evaluation of spinal stenosis, and disk problems. And more recently shooting pain from her back to her L leg.   She has noticed lately bruises that she does not recall any particular trauma, particularly her legs b/l.  She was recently seen by her PMD who did labs for her but expresses concerns about possible lab error.  She reports intermittent LLE swelling, her PMD did LE venous US neg for DVT.  She has had a couple nose bleeds in the last few months, easily controlled, no melena or bleeding reported otherwise.  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 PTSD (post-traumatic stress disorder)   . Raynaud's disease   . Anxiety   . Hypercholesteremia   . Stroke (HCC)   . GERD (gastroesophageal reflux disease) 04/25/2015  . Hereditary and idiopathic peripheral neuropathy 04/25/2015  . Hypertension   . Atrial fibrillation (HCC)   . Anxiety   . Migraine    Past Surgical History  Procedure Laterality Date  . Wrist surgery    . Abdominal hysterectomy    . Knee surgery    .  Hip surgery  Right  . Tee without cardioversion N/A 09/05/2014    Procedure: TRANSESOPHAGEAL ECHOCARDIOGRAM (TEE);  Surgeon: Wendall Stade, MD;  Location: [facility] ENDOSCOPY;  Service: Cardiovascular;  Laterality: N/A;  loop after  . Loop recorder implant N/A 09/05/2014    MDT LINQ implanted by Dr Johney Frame for cryptogenic stroke    Current Outpatient Prescriptions  Medication Sig Dispense Refill  . acetaminophen (TYLENOL) 500 MG tablet Take 500 mg by mouth every 6 (six) hours as needed for mild pain, moderate pain or headache.    Marland Kitchen amitriptyline (ELAVIL) 25 MG tablet Take 1 tablet (25 mg total) by mouth daily. 90 tablet 0  . apixaban (ELIQUIS) 5 MG TABS tablet Take 1 tablet (5 mg total) by mouth 2 (two) times daily. 180 tablet 3  . clonazePAM (KLONOPIN) 0.5 MG tablet Take 1 tablet (0.5 mg total) by mouth 2 (two) times daily as needed (anxiety). 60 tablet 0  . clotrimazole-betamethasone (LOTRISONE) cream Apply 1 application topically 2 (two) times daily. 30 g 0  . DULoxetine (CYMBALTA) 60 MG capsule Take 1 capsule (60 mg total) by mouth daily. 30 capsule 3  . fluticasone (FLONASE) 50 MCG/ACT nasal spray Place 2 sprays into both nostrils daily. 16 g 1  . gabapentin (NEURONTIN) 300 MG capsule Take 1 capsule (300 mg total) by mouth 3 (three) times daily. 90 capsule 11  . methocarbamol (ROBAXIN) 500 MG tablet Take 1 tablet (500 mg total) by mouth at bedtime  as needed for muscle spasms (take one daily by mouth at bedtime if needed). 30 tablet 0  . omeprazole (PRILOSEC) 20 MG capsule Take 1 capsule (20 mg total) by mouth daily. 30 capsule 1  . oxyCODONE-acetaminophen (ROXICET) 5-325 MG tablet 1 tab po every 8-12 hours as needed severe pain 60 tablet 0  . rosuvastatin (CRESTOR) 40 MG tablet Take 1 tablet (40 mg total) by mouth daily. 30 tablet 5  . sertraline (ZOLOFT) 50 MG tablet Take 2 tablets (100 mg total) by mouth daily. 60 tablet 3  . traZODone (DESYREL) 100 MG tablet TAKE 1 TABLET (100 MG TOTAL) BY MOUTH  AT BEDTIME. 30 tablet 2  . verapamil (CALAN-SR) 120 MG CR tablet Take 1 tablet (120 mg total) by mouth daily. 90 tablet 3   No current facility-administered medications for this visit.    Allergies:   Tape   Social History: Social History   Social History  . Marital Status: Single    Spouse Name: N/A  . Number of Children: 0  . Years of Education: 12th   Occupational History  . n/a    Social History Main Topics  . Smoking status: Former Smoker -- 0.20 packs/day    Types: Cigarettes  . Smokeless tobacco: Never Used  . Alcohol Use: 0.6 oz/week    0 Standard drinks or equivalent, 1 Glasses of wine per week     Comment: occasionally  . Drug Use: No  . Sexual Activity: Not on file   Other Topics Concern  . Not on file   Social History Narrative   Patient lives at home alone.   Caffeine use: 1 cup of coffee and 20 oz of soda    Family History: Family History  Problem Relation Age of Onset  . Colon cancer Brother   . Heart disease Mother   . Heart attack Father      Review of Systems: All other systems reviewed and are otherwise negative except as noted above.   Physical Exam: VS:  BP 144/78 mmHg  Pulse 74  Ht 5\' 5"  (1.651 m)  Wt 199 lb (90.266 kg)  BMI 33.12 kg/m2 , BMI Body mass index is 33.12 kg/(m^2).  GEN- The patient is well appearing, overweight, oriented x 3 today.   HEENT: normocephalic, atraumatic; sclera clear, conjunctiva pink; hearing intact; oropharynx clear; neck supple  Lungs- Clear to ausculation bilaterally, normal work of breathing.  No wheezes, rales, rhonchi Heart- Regular rate and rhythm, no murmurs, rubs or gallops  GI- soft, non-tender, non-distended Extremities- no clubbing, cyanosis, or edema.  She has a couple small bruises to her b/l LE sporadically, a larger old appearing/resolving ecchymosis to her R outer thigh.  No petechia  or exsessive bruising is appreciated MS- no significant deformity or atrophy Skin- warm and dry, no rash  or lesion; ILR incision well healed Psych- euthymic mood, full affect Neuro- strength and sensation are intact  ILR Interrogation- reviewed in detail today,  No observations/arrhythmias since last interrogation EKG:  Done today is SR 11/02/15: Venous US b/l LE IMPRESSION: No evidence of deep venous thrombosis bilateral lower extremity. 06/02/15: Echocardiogram Study Conclusions - Left ventricle: The cavity size was normal. Wall thickness was increased in a pattern of mild LVH. Systolic function was normal. The estimated ejection fraction was in the range of 55% to 60%. Wall motion was normal; there were no regional wall motion abnormalities. Doppler parameters are consistent with abnormal left ventricular relaxation (grade 1 diastolic dysfunction). - Aortic valve:  There was mild to moderate regurgitation. - Mitral valve: There was mild regurgitation.  Recent Labs: 08/06/2015: ALT 11; BUN 9; Creatinine, Ser 0.83; Potassium 5.0; Sodium 139 11/02/2015: Hemoglobin 13.7; Platelets 175   Wt Readings from Last 3 Encounters:  11/13/15 199 lb (90.266 kg)  11/02/15 197 lb (89.359 kg)  10/26/15 196 lb 3.2 oz (88.996 kg)     Other studies Reviewed: Additional studies/ records that were reviewed today include above  Assessment and Plan:  1.  Paroxysmal atrial fibrillation Burden 0% by device interrogation today Continue Eliquis for CHADS2VASC of at least 4 Reports of unusual/easy brusing, will recheck her labs with coags  2.  Stroke Follow up with neurology as scheduled  3.  Tobacco abuse She has quit smoking now about 3 months  4.  SVT None further on the Verapamil Continue to follow burden through LINQ  5. HLD     On statin since August labs, she will follow with her PMD   Current medicines are reviewed at length with the patient today.   The patient does not have concerns regarding her medicines.   Disposition:   Follow up with Dr. Johney Frame in 6 months     Signed, Francis Dowse, PA-C 11/13/2015 12:06 PM  [facility] [address] [place] [address] [phone] (office) [phone] (fax)

## 2015-11-12 NOTE — Telephone Encounter (Signed)
Spoke with pt about MRI results and pt states that she is still having some pain and she has an appointment with Dr.Sethi on 11/22/15. She wants to know if we have had any luck on the referral to another neurosurgeon. Please advise.

## 2015-11-12 NOTE — Telephone Encounter (Signed)
Pt has seen Dr. Conchita Paris for neck pain. She states she mentions other spinal symptoms to him and then she saw me later. She pain in lower and mid back radiating to her legs. I did lumbar spine Mri will you send this report to his office.

## 2015-11-13 ENCOUNTER — Ambulatory Visit (INDEPENDENT_AMBULATORY_CARE_PROVIDER_SITE_OTHER): Payer: Commercial Managed Care - HMO | Admitting: Physician Assistant

## 2015-11-13 ENCOUNTER — Encounter: Payer: Self-pay | Admitting: Physician Assistant

## 2015-11-13 ENCOUNTER — Encounter: Payer: Self-pay | Admitting: Internal Medicine

## 2015-11-13 ENCOUNTER — Telehealth: Payer: Self-pay | Admitting: Medical

## 2015-11-13 VITALS — BP 144/78 | HR 74 | Ht 65.0 in | Wt 199.0 lb

## 2015-11-13 DIAGNOSIS — G459 Transient cerebral ischemic attack, unspecified: Secondary | ICD-10-CM

## 2015-11-13 DIAGNOSIS — I639 Cerebral infarction, unspecified: Secondary | ICD-10-CM

## 2015-11-13 DIAGNOSIS — I4891 Unspecified atrial fibrillation: Secondary | ICD-10-CM

## 2015-11-13 LAB — BASIC METABOLIC PANEL
BUN: 7 mg/dL (ref 7–25)
CHLORIDE: 106 mmol/L (ref 98–110)
CO2: 26 mmol/L (ref 20–31)
CREATININE: 0.77 mg/dL (ref 0.50–1.05)
Calcium: 9.3 mg/dL (ref 8.6–10.4)
Glucose, Bld: 81 mg/dL (ref 65–99)
POTASSIUM: 4.5 mmol/L (ref 3.5–5.3)
Sodium: 140 mmol/L (ref 135–146)

## 2015-11-13 LAB — CBC
HEMATOCRIT: 38.9 % (ref 36.0–46.0)
Hemoglobin: 13.2 g/dL (ref 12.0–15.0)
MCH: 31.3 pg (ref 26.0–34.0)
MCHC: 33.9 g/dL (ref 30.0–36.0)
MCV: 92.2 fL (ref 78.0–100.0)
MPV: 9.3 fL (ref 8.6–12.4)
Platelets: 171 10*3/uL (ref 150–400)
RBC: 4.22 MIL/uL (ref 3.87–5.11)
RDW: 13.9 % (ref 11.5–15.5)
WBC: 10.4 10*3/uL (ref 4.0–10.5)

## 2015-11-13 MED ORDER — VERAPAMIL HCL ER 120 MG PO TBCR
120.0000 mg | EXTENDED_RELEASE_TABLET | Freq: Every day | ORAL | Status: DC
Start: 2015-11-13 — End: 2016-11-03

## 2015-11-13 MED ORDER — APIXABAN 5 MG PO TABS: 5.0000 mg | ORAL_TABLET | Freq: Two times a day (BID) | ORAL | Status: DC

## 2015-11-13 NOTE — Telephone Encounter (Signed)
I sent that question regarding name of different neurosurgeon to Dr. Pearlean Brownie office around the time I last saw her. No answer yet. So he may tell her next week.

## 2015-11-13 NOTE — Patient Instructions (Addendum)
Medication Instructions:   Your physician recommends that you continue on your current medications as directed. Please refer to the Current Medication list given to you today.    If you need a refill on your cardiac medications before your next appointment, please call your pharmacy.  Labwork:  BMET CBC PT/PTT TODAY    Testing/Procedures:  NONE ORDER TODAY      Follow-Up:Your physician wants you to follow-up in:  IN 6 MONTHS WITH DR Johney Frame You will receive a reminder letter in the mail two months in advance. If you don't receive a letter, please call our office to schedule the follow-up appointment.        Any Other Special Instructions Will Be Listed Below (If Applicable).

## 2015-11-14 ENCOUNTER — Ambulatory Visit (INDEPENDENT_AMBULATORY_CARE_PROVIDER_SITE_OTHER): Payer: Commercial Managed Care - HMO | Admitting: *Deleted

## 2015-11-14 DIAGNOSIS — I639 Cerebral infarction, unspecified: Secondary | ICD-10-CM

## 2015-11-14 LAB — APTT: aPTT: 33 seconds (ref 24–37)

## 2015-11-15 ENCOUNTER — Telehealth: Payer: Self-pay | Admitting: Internal Medicine

## 2015-11-15 ENCOUNTER — Telehealth: Payer: Self-pay | Admitting: *Deleted

## 2015-11-15 ENCOUNTER — Telehealth: Payer: Self-pay | Admitting: Medical

## 2015-11-15 NOTE — Telephone Encounter (Signed)
Relation to ZO:XWRU Call back number:[phone]   Reason for call:  Patient states ALLRED,JAMES Dr. Would like her to have labs drawn and due to our location its convenient for her. Awaiting a call back from Dr. Hillis Range office regarding the specifics.

## 2015-11-15 NOTE — Telephone Encounter (Signed)
-----   Message from Renee Lynn Ursuy, PA-C sent at 11/14/2015  9:13 AM EST ----- Please let the patient know her labs look good.  The coagulation test ran is within normal limits, one part was not done, if she can come in to draw to get the PT/INR done.  Thanks Renee 

## 2015-11-15 NOTE — Telephone Encounter (Signed)
New message      Pt said Dr Johney Frame want her to have labs drawn.  The [facility] [place] will draw it but there is no order in the computer.  Please call and let them know if we want them to draw labs and that the order is in the computer

## 2015-11-16 NOTE — Progress Notes (Signed)
Carelink Summary Report / Loop Recorder 

## 2015-11-19 ENCOUNTER — Telehealth: Payer: Self-pay | Admitting: *Deleted

## 2015-11-19 NOTE — Telephone Encounter (Signed)
-----   Message from Sheilah Pigeon, [place] sent at 11/14/2015  9:13 AM EST ----- Please let the patient know her labs look good.  The coagulation test ran is within normal limits, one part was not done, if she can come in to draw to get the PT/INR done.  Thanks [employer]

## 2015-11-19 NOTE — Telephone Encounter (Signed)
Cardiology informed labs have to be drawn at Dr. Johney Frame office, Dr. Johney Frame office will inform patient

## 2015-11-19 NOTE — Telephone Encounter (Signed)
ERROR

## 2015-11-21 ENCOUNTER — Telehealth: Payer: Self-pay | Admitting: Medical

## 2015-11-21 ENCOUNTER — Other Ambulatory Visit: Payer: Self-pay | Admitting: Medical

## 2015-11-21 DIAGNOSIS — M48061 Spinal stenosis, lumbar region without neurogenic claudication: Secondary | ICD-10-CM

## 2015-11-21 DIAGNOSIS — M5410 Radiculopathy, site unspecified: Secondary | ICD-10-CM

## 2015-11-21 DIAGNOSIS — M4802 Spinal stenosis, cervical region: Secondary | ICD-10-CM

## 2015-11-21 MED ORDER — CLONAZEPAM 0.5 MG PO TABS: 0.5000 mg | ORAL_TABLET | Freq: Two times a day (BID) | ORAL | Status: DC | PRN

## 2015-11-21 MED FILL — DULoxetine HCL 60 MG CPEP: 60 | 30 days supply | Qty: 30 | Fill #1

## 2015-11-21 MED FILL — traZODone HCL 100 MG TABS: 100 | 30 days supply | Qty: 30 | Fill #1

## 2015-11-21 MED FILL — SERTRALINE HCL 50 MG TABLET: 50 | 30 days supply | Qty: 60 | Fill #1

## 2015-11-21 MED FILL — GABAPENTIN 100 MG CAPSULE: 100 | 30 days supply | Qty: 90 | Fill #1

## 2015-11-21 MED FILL — GABAPENTIN 300 MG CAPSULE: 300 | 30 days supply | Qty: 90 | Fill #1

## 2015-11-21 MED FILL — METHOCARBAMOL 500 MG TABLET: 500 | 30 days supply | Qty: 30 | Fill #0

## 2015-11-21 NOTE — Telephone Encounter (Addendum)
Relation to ZO:XWRU Call back number:[phone]   Reason for call:  Patient scheduled her medicare wellness 11/27/15

## 2015-11-21 NOTE — Telephone Encounter (Signed)
Pt was last seen 11/02/15, Last refill was 10/26/15. Please advise.

## 2015-11-21 NOTE — Telephone Encounter (Signed)
Last refill on Clonzaepam was 10/19/15 #60 with 0 refills.  Pt also requesting Omeprazole Last refill was 09/19/15 Please advise on refills.

## 2015-11-21 NOTE — Telephone Encounter (Signed)
Disregard that note on Tabby about zpack. That was on wrong pt.

## 2015-11-21 NOTE — Telephone Encounter (Signed)
I sent you a note on xray. Advise pt and daughter also. If chest congestion worsens or productive cough go ahead and start azithromycin.That was sent to pharmacy and made available.

## 2015-11-21 NOTE — Telephone Encounter (Signed)
Actually I signed clonapin and omeprazole. If you would chang sig on oxycodone and I will sign that rx.

## 2015-11-21 NOTE — Telephone Encounter (Signed)
You could refill the clonapin, oxycodone and the omeprazole. On the oxycodone reprint sig as 1 every 8 hours since that is the way appears she is using.(no refills on oxycodone. Write same number of tabs as before). On clonapin you can give her one refill. Would you check and see if she has drug contract. I am pretty sure she does but want to make sure. I will sign controlled med when you give them to me.

## 2015-11-21 NOTE — Telephone Encounter (Addendum)
Relation to BJ:YNWG Call back number: [phone] Pharmacy: [facility] [place] - [address] [phone] (Phone) [phone] (Fax)         Reason for call:  Patient requesting a refill oxyCODONE-acetaminophen (ROXICET) 5-325 MG tablet and clotrimazole-betamethasone (LOTRISONE) cream

## 2015-11-22 ENCOUNTER — Telehealth: Payer: Self-pay

## 2015-11-22 ENCOUNTER — Ambulatory Visit: Payer: Self-pay | Admitting: Neurology

## 2015-11-22 MED ORDER — OXYCODONE-ACETAMINOPHEN 5-325 MG PO TABS: ORAL_TABLET | ORAL | Status: DC

## 2015-11-22 MED ORDER — CLOTRIMAZOLE-BETAMETHASONE 1-0.05 % EX CREA: 1.0000 "application " | TOPICAL_CREAM | Freq: Two times a day (BID) | CUTANEOUS | Status: DC

## 2015-11-22 MED FILL — CLOTRIMAZOLE-BETAMETHASONE: 1-0.05 | 15 days supply | Qty: 30 | Fill #0

## 2015-11-22 MED FILL — clonazePAM 0.5 MG TABS: 0.5 | 30 days supply | Qty: 60 | Fill #0

## 2015-11-22 MED FILL — OMEPRAZOLE DR 20 MG CAPSULE: 20 | 30 days supply | Qty: 30 | Fill #0

## 2015-11-22 MED FILL — OXYCODONE/APAP 5-325: 5-325 | 30 days supply | Qty: 90 | Fill #0

## 2015-11-22 NOTE — Telephone Encounter (Signed)
Pt was made aware to pick up the medication.

## 2015-11-22 NOTE — Telephone Encounter (Signed)
Refilled her pain med and lotrisone.

## 2015-11-22 NOTE — Telephone Encounter (Signed)
Received a fax from downstairs that pt was requesting a refill on her lotrisone cream and they have not filled it before. Please advise if new Rx will be needed for a refill.

## 2015-11-22 NOTE — Telephone Encounter (Signed)
Rn call patient about her appt today with Dr.Sethi. Patient already has appt schedule in March for stroke follow up. Pt was referred to neurosurgery by Dr.Sethi. For her neck pain. Patient stated she went to the neurosurgery, and did not like him. She felt the referral to the neurosurgery did not fit her needs for her issues. Pt is seeing Dr.Sethi only for stroke.Rn stated Dr.Sethi can refer her to another neurosurgery if she knows of any. Pt stated her family member has a neurosurgery at [facility]. Pt did not have the name or contact information. Pt stated she will ask her PCP for the referral because of her insurance with [employer]. Pt will keep her appt in March 2017 with Dr.Sethi. Pt will call back if she has any other issues.

## 2015-11-22 NOTE — Telephone Encounter (Signed)
Pt had an appointment with Dr. Pearlean Brownie and they cancelled the appointment with out calling the pt. She was advised that she would need to follow up with PCP. Pt states that she would to see ortho [place] with [facility], [phone] is their direct number.

## 2015-11-23 NOTE — Telephone Encounter (Signed)
Please see referral to ortho. Do they work with neurosurgeon?

## 2015-11-23 NOTE — Telephone Encounter (Signed)
Will you let pt know that I did put in the referral to ortho. Ollen Gross and Victorino Dike are working on referral.

## 2015-11-26 ENCOUNTER — Telehealth: Payer: Self-pay | Admitting: Behavioral Health

## 2015-11-26 MED FILL — VERAPAMIL ER 120 MG TABLET: 120 | 90 days supply | Qty: 90 | Fill #0

## 2015-11-26 NOTE — Telephone Encounter (Signed)
Pt was made aware on 11/22/15 that a PCP was working on referral.

## 2015-11-26 NOTE — Telephone Encounter (Signed)
Patient rescheduled appointment for 11/30/15 at 1:45 PM.

## 2015-11-27 ENCOUNTER — Ambulatory Visit: Payer: Commercial Managed Care - HMO

## 2015-11-28 ENCOUNTER — Telehealth: Payer: Self-pay | Admitting: Medical

## 2015-11-28 NOTE — Telephone Encounter (Signed)
Relation to ZO:XWRU Call back [phone]   Reason for call:  Patient requesting imaging disc and report and would like office to fax over MRI report from [facility] and [facility] Imaging MRI sent over to orthopedic. Patient would like to speak with Francesco Runner

## 2015-11-28 NOTE — Telephone Encounter (Signed)
error:[id number] ° °

## 2015-11-29 ENCOUNTER — Telehealth: Payer: Self-pay | Admitting: Behavioral Health

## 2015-11-29 NOTE — Telephone Encounter (Signed)
Attempted to reach patient to reschedule Medicare Wellness Visit for a later date. Left a message for a callback.

## 2015-11-29 NOTE — Telephone Encounter (Signed)
Spoke with pt and she was made aware that she would have to pick up a disk from downstairs and I could fax them our information and she would have to contact [facility] to send their information over to the Orthopedics.

## 2015-11-29 NOTE — Telephone Encounter (Signed)
Please let patient know that she will need to go to the imaging department and ask for the disc.  They will give it to her

## 2015-11-30 ENCOUNTER — Ambulatory Visit: Payer: Commercial Managed Care - HMO

## 2015-12-03 MED FILL — ROSUVASTATIN CALCIUM 40 MG: 40 | 30 days supply | Qty: 30 | Fill #1

## 2015-12-05 ENCOUNTER — Ambulatory Visit (INDEPENDENT_AMBULATORY_CARE_PROVIDER_SITE_OTHER): Payer: Medicare Other

## 2015-12-05 VITALS — BP 126/72 | HR 72 | Ht 64.96 in | Wt 198.2 lb

## 2015-12-05 DIAGNOSIS — Z1231 Encounter for screening mammogram for malignant neoplasm of breast: Secondary | ICD-10-CM | POA: Diagnosis not present

## 2015-12-05 DIAGNOSIS — Z0000 Encounter for general adult medical examination without abnormal findings: Secondary | ICD-10-CM | POA: Diagnosis not present

## 2015-12-05 LAB — CUP PACEART REMOTE DEVICE CHECK: MDC IDC SESS DTM: 20170109190828

## 2015-12-05 NOTE — Progress Notes (Signed)
Pre visit review using our clinic review tool, if applicable. No additional management support is needed unless otherwise documented below in the visit note. 

## 2015-12-05 NOTE — Progress Notes (Addendum)
Subjective:   Dawn Escobar is a 55 y.o. female who presents for an Initial Medicare Annual Wellness Visit.  Review of Systems:  No ROS Cardiac Risk Factors include: sedentary lifestyle Sleep patterns: Patient has interrupted sleep throughout the night; only sleeps four hours each night.    Home Safety/Smoke Alarms: Lives with fiancee' and smoke alarms are present in home. Firearm Safety: There are no firearms in the home. Seat Belt Safety/Bike Helmet: Patient wears seatbelt at all times.  Counseling:   Eye Exam- 10/2014 Dental- Last visit several years ago; patient has upper dentures. Female:  Pap- past history of partial hysterectomy; still has ovaries   Mammo- 10/06/13-negative    CCS- 10/06/12; polyps removed      Objective:    Today's Vitals   12/05/15 1406  BP: 126/72  Pulse: 72  Height: 5' 4.96" (1.65 m)  Weight: 198 lb 3.2 oz (89.903 kg)  SpO2: 97%  PainSc: 8     Current Medications (verified) Outpatient Encounter Prescriptions as of 12/05/2015  Medication Sig  . acetaminophen (TYLENOL) 500 MG tablet Take 500 mg by mouth every 6 (six) hours as needed for mild pain, moderate pain or headache.  Marland Kitchen amitriptyline (ELAVIL) 25 MG tablet Take 1 tablet (25 mg total) by mouth daily.  Marland Kitchen apixaban (ELIQUIS) 5 MG TABS tablet Take 1 tablet (5 mg total) by mouth 2 (two) times daily.  . clonazePAM (KLONOPIN) 0.5 MG tablet Take 1 tablet (0.5 mg total) by mouth 2 (two) times daily as needed (anxiety).  . clotrimazole-betamethasone (LOTRISONE) cream Apply 1 application topically 2 (two) times daily.  . DULoxetine (CYMBALTA) 60 MG capsule Take 1 capsule (60 mg total) by mouth daily.  . fluticasone (FLONASE) 50 MCG/ACT nasal spray Place 2 sprays into both nostrils daily.  Marland Kitchen gabapentin (NEURONTIN) 300 MG capsule Take 1 capsule (300 mg total) by mouth 3 (three) times daily.  . methocarbamol (ROBAXIN) 500 MG tablet Take 1 tablet (500 mg total) by mouth at bedtime as needed for muscle spasms  (take one daily by mouth at bedtime if needed).  Marland Kitchen omeprazole (PRILOSEC) 20 MG capsule TAKE ONE CAPSULE BY MOUTH DAILY  . oxyCODONE-acetaminophen (ROXICET) 5-325 MG tablet 1 tab po every 8  as needed severe pain  . rosuvastatin (CRESTOR) 40 MG tablet Take 1 tablet (40 mg total) by mouth daily.  . sertraline (ZOLOFT) 50 MG tablet Take 2 tablets (100 mg total) by mouth daily.  . traZODone (DESYREL) 100 MG tablet TAKE 1 TABLET (100 MG TOTAL) BY MOUTH AT BEDTIME.  . verapamil (CALAN-SR) 120 MG CR tablet Take 1 tablet (120 mg total) by mouth daily.   No facility-administered encounter medications on file as of 12/05/2015.    Allergies (verified) Tape   History: Past Medical History  Diagnosis Date  . PTSD (post-traumatic stress disorder)   . Raynaud's disease   . Anxiety   . Hypercholesteremia   . Stroke (HCC)   . GERD (gastroesophageal reflux disease) 04/25/2015  . Hereditary and idiopathic peripheral neuropathy 04/25/2015  . Hypertension   . Atrial fibrillation (HCC)   . Anxiety   . Migraine   . Stroke [facility])    Past Surgical History  Procedure Laterality Date  . Wrist surgery    . Abdominal hysterectomy    . Knee surgery    . Hip surgery  Right  . Tee without cardioversion N/A 09/05/2014    Procedure: TRANSESOPHAGEAL ECHOCARDIOGRAM (TEE);  Surgeon: Wendall Stade, MD;  Location: [facility] ENDOSCOPY;  Service: Cardiovascular;  Laterality: N/A;  loop after  . 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 Colon cancer Brother   . Heart disease Mother   . Heart attack Father    Social History   Occupational History  . n/a    Social History Main Topics  . Smoking status: Former Smoker -- 0.20 packs/day    Types: Cigarettes  . Smokeless tobacco: Never Used  . Alcohol Use: 0.6 oz/week    1 Glasses of wine, 0 Standard drinks or equivalent per week     Comment: occasionally  . Drug  Use: Yes    Special: Oxycodone     Comment: patient takes oxycodone for pain  . Sexual Activity: No     Comment: pt. reports that she believes she's going through the change; have no sex drive; hot flashes and excessive sweating for a couple of months now    Tobacco Counseling Counseling given: Not Answered   Activities of Daily Living In your present state of health, do you have any difficulty performing the following activities: 12/05/2015 12/05/2015  Hearing? - N  Vision? - Y  Difficulty concentrating or making decisions? - Y  Walking or climbing stairs? - Y  Dressing or bathing? - N  Doing errands, shopping? - N  Quarry manager and eating ? - N  Using the Toilet? - N  In the past six months, have you accidently leaked urine? - N  Do you have problems with loss of bowel control? - N  Managing your Medications? Y N  Managing your Finances? [place]  Housekeeping or managing your Housekeeping? - Y    Immunizations and Health Maintenance Immunization History  Administered Date(s) Administered  . Influenza,inj,Quad PF,36+ Mos 06/03/2015  . Influenza-Unspecified 10/06/2013   Health Maintenance Due  Topic Date Due  . TETANUS/TDAP  01/24/1981  . MAMMOGRAM  10/07/2015    Patient Care Team: Esperanza Richters, PA-C as PCP - General (Physician Assistant) Hillis Range, MD as Consulting Physician (Cardiology) Micki Riley, MD as Consulting Physician (Neurology)  Indicate any recent Medical Services you may have received from other than [facility] providers in the past year (date may be approximate).     Assessment:   This is a routine wellness examination for [name].  Hearing/Vision screen  Hearing Screening           Right ear:   Pass Pass Pass Pass   Left ear:   Pass Pass Pass Pass   Vision Screening Comments: Last eye exam 3 years ago; wears corrective lenses.  Dietary issues and exercise activities discussed: Exercise:  No exercise routine  currently.  Diet:  Breakfast - a bowl of oatmeal and a cup of coffee; has not eaten lunch x 2 weeks now; canned goods (e.g. [employer]) and deli meats for dinner. Drink lots of water.   Goals    . Complete Mammogram before the end of the year.      . Increase physical activity     Plans to increase physical activity after hip and bilateral leg pain well controlled.        Depression Screen PHQ 2/9 Scores 12/05/2015  PHQ - 2 Score 6  PHQ- 9 Score 21    Fall Risk Fall Risk  12/05/2015 12/05/2015 09/13/2015  Falls in the past year? Yes Yes No  Number falls in past yr: -  2 or more -  Injury with Fall? - No -  Risk Factor Category  - High Fall Risk -  Risk for fall due to : - Other (Comment) -  Risk for fall due to (comments): - pt. reports neck & back pain; also in the hip &  groin area, causing bilateral leg weeakness -  Follow up - Follow up appointment -    Cognitive Function: MMSE - Mini Mental State Exam 12/05/2015  Orientation to time 5  Orientation to Place 5  Registration 3  Attention/ Calculation 5  Recall 2  Language- name 2 objects 2  Language- repeat 1  Language- follow 3 step command 3  Language- read & follow direction 1  Write a sentence 1  Copy design 1  Total score 29    Screening Tests Health Maintenance  Topic Date Due  . TETANUS/TDAP  01/24/1981  . MAMMOGRAM  10/07/2015  . Hepatitis C Screening  07/22/2016 (Originally 1961/06/08)  . HIV Screening  07/22/2016 (Originally 01/24/1976)  . INFLUENZA VACCINE  05/06/2016  . COLONOSCOPY  10/06/2022      Plan:  Schedule mammogram at your earliest convenience. Eat a heart healthy diet, full of fruits and vegetable, lean proteins and whole wheat grain; limit salt, sugars and caffeine. Increase physical activity as tolerated. Keep appointment with Dr. Pearlean Brownie as previously scheduled. Follow-up with your PCP on 12/20/15 at 1:00 PM and orthopedic surgeon as scheduled.    During the course of the visit, Katalaya was  educated and counseled about the following appropriate screening and preventive services:   Vaccines to include Pneumoccal, Influenza, Hepatitis B, Td, Zostavax, HCV  Electrocardiogram  Cardiovascular disease screening  Colorectal cancer screening  Bone density screening  Diabetes screening  Glaucoma screening  Mammography/PAP  Nutrition counseling  Smoking cessation counseling  Patient Instructions (the written plan) were given to the patient.    Harold Barban, RN   12/06/2015      I have reviwed and agree evaluation. I am going to see pt on 12-18-2015 for exam and attempt to close maintenance gaps.(this was placed the other day in note) Esperanza Richters PA-C

## 2015-12-05 NOTE — Progress Notes (Signed)
Carelink summary report received. Battery status OK. Normal device function. No new symptom episodes, tachy episodes, brady, or pause episodes. No new AF episodes. Monthly summary reports and ROV/PRN 

## 2015-12-05 NOTE — Patient Instructions (Addendum)
Schedule mammogram at your earliest convenience. Eat a heart healthy diet, full of fruits and vegetable, lean proteins and whole wheat grain; limit salt, sugars and caffeine. Increase physical activity as tolerated. Keep appointment with Dr. Pearlean Brownie as previously scheduled. Follow-up with your PCP on 12/20/15 at 1:00 PM and orthopedic surgeon as scheduled.    I reviewed evaluation, assessment and plan. Will attempt to fill in maintenance gaps when she is in to see me. (not also please) I placed statement under RN signature. Ramon Dredge Saguier PA-C     Fall Prevention in the Home  Falls can cause injuries. They can happen to people of all ages. There are many things you can do to make your home safe and to help prevent falls.  WHAT CAN I DO ON THE OUTSIDE OF MY HOME?  Regularly fix the edges of walkways and driveways and fix any cracks.  Remove anything that might make you trip as you walk through a door, such as a raised step or threshold.  Trim any bushes or trees on the path to your home.  Use bright outdoor lighting.  Clear any walking paths of anything that might make someone trip, such as rocks or tools.  Regularly check to see if handrails are loose or broken. Make sure that both sides of any steps have handrails.  Any raised decks and porches should have guardrails on the edges.  Have any leaves, snow, or ice cleared regularly.  Use sand or salt on walking paths during winter.  Clean up any spills in your garage right away. This includes oil or grease spills. WHAT CAN I DO IN THE BATHROOM?   Use night lights.  Install grab bars by the toilet and in the tub and shower. Do not use towel bars as grab bars.  Use non-skid mats or decals in the tub or shower.  If you need to sit down in the shower, use a plastic, non-slip stool.  Keep the floor dry. Clean up any water that spills on the floor as soon as it happens.  Remove soap buildup in the tub or shower  regularly.  Attach bath mats securely with double-sided non-slip rug tape.  Do not have throw rugs and other things on the floor that can make you trip. WHAT CAN I DO IN THE BEDROOM?  Use night lights.  Make sure that you have a light by your bed that is easy to reach.  Do not use any sheets or blankets that are too big for your bed. They should not hang down onto the floor.  Have a firm chair that has side arms. You can use this for support while you get dressed.  Do not have throw rugs and other things on the floor that can make you trip. WHAT CAN I DO IN THE KITCHEN?  Clean up any spills right away.  Avoid walking on wet floors.  Keep items that you use a lot in easy-to-reach places.  If you need to reach something above you, use a strong step stool that has a grab bar.  Keep electrical cords out of the way.  Do not use floor polish or wax that makes floors slippery. If you must use wax, use non-skid floor wax.  Do not have throw rugs and other things on the floor that can make you trip. WHAT CAN I DO WITH MY STAIRS?  Do not leave any items on the stairs.  Make sure that there are handrails on both sides of  the stairs and use them. Fix handrails that are broken or loose. Make sure that handrails are as long as the stairways.  Check any carpeting to make sure that it is firmly attached to the stairs. Fix any carpet that is loose or worn.  Avoid having throw rugs at the top or bottom of the stairs. If you do have throw rugs, attach them to the floor with carpet tape.  Make sure that you have a light switch at the top of the stairs and the bottom of the stairs. If you do not have them, ask someone to add them for you. WHAT ELSE CAN I DO TO HELP PREVENT FALLS?  Wear shoes that:  Do not have high heels.  Have rubber bottoms.  Are comfortable and fit you well.  Are closed at the toe. Do not wear sandals.  If you use a stepladder:  Make sure that it is fully  opened. Do not climb a closed stepladder.  Make sure that both sides of the stepladder are locked into place.  Ask someone to hold it for you, if possible.  Clearly mark and make sure that you can see:  Any grab bars or handrails.  First and last steps.  Where the edge of each step is.  Use tools that help you move around (mobility aids) if they are needed. These include:  Canes.  Walkers.  Scooters.  Crutches.  Turn on the lights when you go into a dark area. Replace any light bulbs as soon as they burn out.  Set up your furniture so you have a clear path. Avoid moving your furniture around.  If any of your floors are uneven, fix them.  If there are any pets around you, be aware of where they are.  Review your medicines with your doctor. Some medicines can make you feel dizzy. This can increase your chance of falling. Ask your doctor what other things that you can do to help prevent falls.   This information is not intended to replace advice given to you by your health care provider. Make sure you discuss any questions you have with your health care provider.   Document Released: 07/19/2009 Document Revised: 02/06/2015 Document Reviewed: 10/27/2014 Elsevier Interactive Patient Education 2016 [employer].  Mammogram A mammogram is an X-ray of the breasts that is done to check for abnormal changes. This procedure can screen for and detect any changes that may suggest breast cancer. A mammogram can also identify other changes and variations in the breast, such as:  Inflammation of the breast tissue (mastitis).  An infected area that contains a collection of pus (abscess).  A fluid-filled sac (cyst).  Fibrocystic changes. This is when breast tissue becomes denser, which can make the tissue feel rope-like or uneven under the skin.  Tumors that are not cancerous (benign). LET [facility] CARE PROVIDER KNOW ABOUT:  Any allergies you have.  If you have breast  implants.  If you have had previous breast disease, biopsy, or surgery.  If you are breastfeeding.  Any possibility that you could be pregnant, if this applies.  If you are younger than age 71.  If you have a family history of breast cancer. RISKS AND COMPLICATIONS Generally, this is a safe procedure. However, problems may occur, including:  Exposure to radiation. Radiation levels are very low with this test.  The results being misinterpreted.  The need for further tests.  The inability of the mammogram to detect certain cancers. BEFORE THE  PROCEDURE  Schedule your test about 1-2 weeks after your menstrual period. This is usually when your breasts are the least tender.  If you have had a mammogram done at a different facility in the past, get the mammogram X-rays or have them sent to your current exam facility in order to compare them.  Wash your breasts and under your arms the day of the test.  Do not wear deodorants, perfumes, lotions, or powders anywhere on your body on the day of the test.  Remove any jewelry from your neck.  Wear clothes that you can change into and out of easily. PROCEDURE  You will undress from the waist up and put on a gown.  You will stand in front of the X-ray machine.  Each breast will be placed between two plastic or glass plates. The plates will compress your breast for a few seconds. Try to stay as relaxed as possible during the procedure. This does not cause any harm to your breasts and any discomfort you feel will be very brief.  X-rays will be taken from different angles of each breast. The procedure may vary among health care providers and hospitals. AFTER THE PROCEDURE  The mammogram will be examined by a specialist (radiologist).  You may need to repeat certain parts of the test, depending on the quality of the images. This is commonly done if the radiologist needs a better view of the breast tissue.  Ask when your test results  will be ready. Make sure you get your test results.  You may resume your normal activities.   This information is not intended to replace advice given to you by your health care provider. Make sure you discuss any questions you have with your health care provider.   Document Released: 09/19/2000 Document Revised: 06/13/2015 Document Reviewed: 12/01/2014 Elsevier Interactive Patient Education [employer]

## 2015-12-06 DIAGNOSIS — M545 Low back pain: Secondary | ICD-10-CM | POA: Diagnosis not present

## 2015-12-06 DIAGNOSIS — M797 Fibromyalgia: Secondary | ICD-10-CM | POA: Diagnosis not present

## 2015-12-06 DIAGNOSIS — M25551 Pain in right hip: Secondary | ICD-10-CM | POA: Diagnosis not present

## 2015-12-06 DIAGNOSIS — M5416 Radiculopathy, lumbar region: Secondary | ICD-10-CM | POA: Diagnosis not present

## 2015-12-06 MED FILL — GABAPENTIN 600 MG TABLET: 600 | 30 days supply | Qty: 90 | Fill #0

## 2015-12-06 NOTE — Assessment & Plan Note (Signed)
Asymptomatic.  Taking Eliquis as prescribed.  BP well-controlled on meds.  On rosuvastatin for cholesterol.  Followed by PCP and neurology.

## 2015-12-06 NOTE — Assessment & Plan Note (Signed)
Asymptomatic.  Currently taking Eliquis as prescribed.  Followed by Cardiology.

## 2015-12-07 ENCOUNTER — Ambulatory Visit (INDEPENDENT_AMBULATORY_CARE_PROVIDER_SITE_OTHER): Payer: Medicare Other | Admitting: Neurology

## 2015-12-07 ENCOUNTER — Encounter: Payer: Self-pay | Admitting: Neurology

## 2015-12-07 VITALS — BP 125/51 | HR 82 | Ht 65.0 in | Wt 200.8 lb

## 2015-12-07 DIAGNOSIS — M5010 Cervical disc disorder with radiculopathy, unspecified cervical region: Secondary | ICD-10-CM | POA: Diagnosis not present

## 2015-12-07 DIAGNOSIS — I639 Cerebral infarction, unspecified: Secondary | ICD-10-CM | POA: Diagnosis not present

## 2015-12-07 NOTE — Patient Instructions (Signed)
I had a long d/w patient about her recent stroke, risk for recurrent stroke/TIAs, personally independently reviewed imaging studies and stroke evaluation results and answered questions.Continue Eliquis  for secondary stroke prevention for atrial fibrillationand maintain strict control of hypertension with blood pressure goal below 130/90, diabetes with hemoglobin A1c goal below 6.5% and lipids with LDL cholesterol goal below 70 mg/dL. I also advised the patient to eat a healthy diet with plenty of whole grains, cereals, fruits and vegetables, exercise regularly and maintain ideal body weight. Continue gabapentin for her cervical radiculopathy pain but discontinue Cymbalta and amitriptyline. Continue follow-up with orthopedic surgeon Dr. Leonia CoronaPeng Bai for surgical treatment for her degenerative spine disease and hip pain. Followup in the future with stroke nurse practitioner in 6 months or call earlier if necessary

## 2015-12-07 NOTE — Progress Notes (Signed)
[facility] [address] [place]. [place] [address] [phone]       OFFICE FOLLOW-UP NOTE  Dawn. Phenix Grein Date of Birth:  [birth year] Medical Record Number:  [id number]   HPI: Dawn Escobar is a 57 Caucasian lady who is referred by Dr. Willa Rough from the emergency room for urgent neurological evaluation. 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 At that time she underwent extensive evaluation for source of embolism which was negative. However she had loop recorder inserted which a few months later showed paroxysmal atrial fibrillation. She has been on eliquis since then he is tolerating it well but she states that she did financial constraints was not able to get any of her other medications and stopped all her other medicines. For the last 1 month she's been having multifocal symptoms of right-sided headaches, muscle twitchings, dizziness, tremor and increased anxiety. She was taking clonazepam when necessary as well as admission: 25 mg at night for anxiety which she clearly has not been taking for the last month or so. She is also noted some subjective worsening of her mild right-sided weakness from a prior stroke as well as numbness. She actually was supposed to see me for follow-up following a previous stroke but she missed one appointment and could not afford to make another appointment due to her finding medical bills. She denies any new focal weakness, double vision, vertigo. Update 06/06/2015 : She is seen urgently today upon last office visit 1 month ago as she was admitted to the hospital on 06/01/15 complaining of headache dizziness speech difficulty. MRI scan of the brain showed acute to subacute left frontal and insular cortex infarct despite being on chronic anticoagulation with eliquis. Carotid ultrasound and transthoracic echo were unremarkable. Hemoglobin A1c was  5.7. Lipid profile showed total cholesterol 243, triglycerides 72, HDL 43 and elevated LDL 185 mg percent. She states that she has been compliant with the medication. She states her speech is improving though she still has some speech hesitancy and word finding difficulty. She is getting a home physical and speech therapy which is about to be completed. She is not clear if there are any plans for outpatient therapy. She complains of increased anxiety, tiredness, decreased appetite, back pain. She states her blood pressure is well controlled and today it is 105/60. She is tolerating eliquis well without bleeding or bruising. She remains on Lipitor and is tolerating it well. Update 09/13/2015 : She is seen urgently today as she requested to be seen for new complaint. She is complaining of left shoulder pain as well as hand numbness for the last several weeks. She was seen by physical therapist and thinks she may have had a pinched nerve. She denies any recent fall, head or neck injury. She has no known prior history of radiculopathy and herniated disc. She describes the pain as starting on the left side of the neck and radiating down to left shoulder and occasionally into the left hand. Pain at times is sharp and shooting. She also complains of some numbness in thumb and index finger. He denies any weakness and trouble gripping objects and holding objects. She remains on eliquis for stroke prevention is tolerating it well without bleeding and only occasional bruising. She has been started on Zoloft 50 mg daily by primary physician for anxiety for the last 1 month but feels she needs a higher dose. She has had  no recurrent stroke or TIA symptoms Update 12/07/15 : She returns for follow-up after last visit we months ago. She continued to have significant pain in her neck left, as well as now in the right hip and leg. She had EMG nerve conduction study done by Dr. 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 MRI scan of cervical spine 09/26/15 showed chronic multifactorial mild spinal and severe foraminal stenosis at C5-6 and C6-7 and chronic multifactorial moderate left-sided C4 and bilateral C5 level foraminal stenosis. Which was actually stable compared with previous MRI from 2015. I had referred the patient to neurosurgery but patient saw Dr. Leonia CoronaPeng Bai from  [facility] in [place]  yesterday. She did not have the films of her C-spine. during the visit. The patient however had severe hip pain during the exam and Dr. by recommended steroid injection the patient is still considering this. She had increased development in at the last visit which had helped but only temporarily. She has a long list of medication and wants me to go over them to decide which ones she could potentially stop. She has had no further recurrent stroke or TIA symptoms. She continues to have mild diminished fine motor skills in the right hand and proximal right leg while walking at times particularly when she is tired. She remains on  on eliquis without bleeding or bruising. ROS:   14 system review of systems is positive for  fatigue,  appetite change, trouble swallowing, light sensitivity, blurred vision, shortness of breath, choking, leg swelling, excessive thirst, cold intolerance, diarrhea, frequent waking, joint pain, back pain, aching muscles, walking difficulty, neck pain, easy bruising, dizziness, headache, numbness, agitation, depression, nervousness, anxiety and all other systems negative PMH:  Past Medical History  Diagnosis Date  . PTSD (post-traumatic stress disorder)   . Raynaud's disease   . Anxiety   . Hypercholesteremia   . Stroke (HCC)   . GERD (gastroesophageal reflux disease) 04/25/2015  . Hereditary and idiopathic peripheral neuropathy 04/25/2015  . Hypertension   . Atrial fibrillation (HCC)   . Anxiety   . Migraine   . Stroke [facility](HCC)     Social History:  Social History   Social History  .  Marital Status: Single    Spouse Name: N/A  . Number of Children: 0  . Years of Education: 12th   Occupational History  . n/a    Social History Main Topics  . Smoking status: Former Smoker -- 0.20 packs/day    Types: Cigarettes  . Smokeless tobacco: Never Used  . Alcohol Use: 0.6 oz/week    1 Glasses of wine, 0 Standard drinks or equivalent per week     Comment: occasionally  . Drug Use: Yes    Special: Oxycodone     Comment: patient takes oxycodone for pain  . Sexual Activity: No     Comment: pt. reports that she believes she's going through the change; have no sex drive; hot flashes and excessive sweating for a couple of months now   Other Topics Concern  . Not on file   Social History Narrative   Patient lives at home alone.   Caffeine use: 1 cup of coffee and 20 oz of soda    Medications:   Current Outpatient Prescriptions on File Prior to Visit  Medication Sig Dispense Refill  . acetaminophen (TYLENOL) 500 MG tablet Take 500 mg by mouth every 6 (six) hours as needed for mild pain, moderate pain or headache.    .Marland Kitchen  amitriptyline (ELAVIL) 25 MG tablet Take 1 tablet (25 mg total) by mouth daily. 90 tablet 0  . apixaban (ELIQUIS) 5 MG TABS tablet Take 1 tablet (5 mg total) by mouth 2 (two) times daily. 180 tablet 3  . clonazePAM (KLONOPIN) 0.5 MG tablet Take 1 tablet (0.5 mg total) by mouth 2 (two) times daily as needed (anxiety). 60 tablet 1  . clotrimazole-betamethasone (LOTRISONE) cream Apply 1 application topically 2 (two) times daily. 30 g 1  . DULoxetine (CYMBALTA) 60 MG capsule Take 1 capsule (60 mg total) by mouth daily. 30 capsule 3  . fluticasone (FLONASE) 50 MCG/ACT nasal spray Place 2 sprays into both nostrils daily. 16 g 1  . gabapentin (NEURONTIN) 300 MG capsule Take 1 capsule (300 mg total) by mouth 3 (three) times daily. 90 capsule 11  . methocarbamol (ROBAXIN) 500 MG tablet Take 1 tablet (500 mg total) by mouth at bedtime as needed for muscle spasms (take one  daily by mouth at bedtime if needed). 30 tablet 0  . omeprazole (PRILOSEC) 20 MG capsule TAKE ONE CAPSULE BY MOUTH DAILY 30 capsule 2  . oxyCODONE-acetaminophen (ROXICET) 5-325 MG tablet 1 tab po every 8  as needed severe pain 90 tablet 0  . rosuvastatin (CRESTOR) 40 MG tablet Take 1 tablet (40 mg total) by mouth daily. 30 tablet 5  . sertraline (ZOLOFT) 50 MG tablet Take 2 tablets (100 mg total) by mouth daily. 60 tablet 3  . traZODone (DESYREL) 100 MG tablet TAKE 1 TABLET (100 MG TOTAL) BY MOUTH AT BEDTIME. 30 tablet 2  . verapamil (CALAN-SR) 120 MG CR tablet Take 1 tablet (120 mg total) by mouth daily. 90 tablet 3   No current facility-administered medications on file prior to visit.    Allergies:   Allergies  Allergen Reactions  . Tape Rash    No tape at all    Physical Exam General: well developed, well nourished middle-aged lady, seated, in no evident distress Head: head normocephalic and atraumatic.  Neck: supple with no carotid or supraclavicular bruits Cardiovascular: regular rate and rhythm, no murmurs Musculoskeletal: no deformity Skin:  no rash/petichiae Vascular:  Normal pulses all extremities Filed Vitals:   12/07/15 1127  BP: 125/51  Pulse: 82   Neurologic Exam Mental Status: Awake and fully alert. Oriented to place and time. Recent and remote memory intact. Attention span, concentration and fund of knowledge appropriate. Mood and affect appropriate.   Cranial Nerves: Fundoscopic exam reveals sharp disnot donec  . Pupils equal, briskly reactive to light. Extraocular movements full without nystagmus. Visual fields full to confrontation. Hearing intact. Facial sensation intact. Face, tongue, palate moves normally and symmetrically.  Motor: Normal bulk and tone. Normal strength in all tested extremity muscles. Mild right grip weakness. Diminished fine finger movements on the right. Orbits left over right approximate it. Mild weakness of right ankle dorsiflexors and hip  flexors only. Sensory.: intact to touch ,pinprick .position and vibratory sensation. Except slight diminished sensation in the right upper extremity and right lower face.  Coordination: Rapid alternating movements normal in all extremities. Finger-to-nose and heel-to-shin performed accurately bilaterally. Gait and Station: Arises from chair without difficulty. Stance is normal. Gait demonstrates normal stride length and balance but slight dragging of the right leg. Able to heel, toe and tandem walk without difficulty.  Reflexes: 1+ and symmetric. Toes downgoing.   NIHSS  1 Modified Rankin  1   ASSESSMENT: 62 year Caucasian lady with embolic left anterior cerebral artery infarct in November  2015 was subsequently found to have paroxysmal atrial fibrillation on loop recorder a few months later.  Left MCA infarct 06/01/15 despite being on anticoagulation on eliquis. 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 I also advised the patient to eat a healthy diet with plenty of whole grains, cereals, fruits and vegetables, exercise regularly and maintain ideal body weight. Continue gabapentin for her cervical radiculopathy pain but discontinue Cymbalta and amitriptyline. Continue follow-up with orthopedic surgeon Dr. Leonia Corona for surgical treatment for her degenerative spine disease and hip pain. Followup in the  future with stroke nurse practitioner in 6 months or call earlier if necessary   Delia Heady, MD   Note: This document was prepared with digital dictation and possible smart phrase technology. Any transcriptional errors that result from this process are unintentional

## 2015-12-10 ENCOUNTER — Telehealth: Payer: Self-pay | Admitting: Medical

## 2015-12-10 DIAGNOSIS — M542 Cervicalgia: Secondary | ICD-10-CM

## 2015-12-10 DIAGNOSIS — M545 Low back pain: Secondary | ICD-10-CM

## 2015-12-10 NOTE — Telephone Encounter (Signed)
Caller name: Angelique BlonderDenise Relationship to patient: self Can be reached: [phone]   Reason for call: pt went to Ortho last week and states they are going to try injections in hip. She said that they scheduled her for appt next week. She has hip, back, and neck pain. If injections do not work with hip they may have to do a hip replacement. Pt states that the oxycodone is not helping with the pain and that she cannot sleep. She had MRI on her neck. Dr. Laurian BrimBai increased gabapentin to 600mg  3xday. Also saw Dr. Pearlean BrownieSethi Friday 12/07/15 and was told to stop Cymbalta and amitriptyline. She said he recommended increase on trazadone so that she is able to sleep. Pt also said her legs continue swelling which checked before with ultrasound. She states she has taken gabapentin and oxycodone about 4:00pm 12/10/15 and it's not helping the pain.

## 2015-12-10 NOTE — Telephone Encounter (Signed)
Edward see note below and advise.  

## 2015-12-11 ENCOUNTER — Telehealth: Payer: Self-pay | Admitting: Medical

## 2015-12-11 LAB — CUP PACEART REMOTE DEVICE CHECK: Date Time Interrogation Session: 20170208190658

## 2015-12-11 NOTE — Progress Notes (Signed)
Carelink summary report received. Battery status OK. Normal device function. No new symptom episodes, tachy episodes, brady, or pause episodes. No new AF episodes. Monthly summary reports and ROV/PRN 

## 2015-12-11 NOTE — Telephone Encounter (Signed)
Pt is requesting a call back from CMA. Pt didn't go into details.    CB: [phone]

## 2015-12-12 NOTE — Telephone Encounter (Signed)
Left message for pt to call back with any questions.

## 2015-12-12 NOTE — Telephone Encounter (Signed)
Since her pain level is high despite seeing the  various specialist and on various meds including oxycodoone, I already placed pain management referral. Since pain exceeding oxycodone and other meds it is time to see pain management.   I think following orhtopedist clinic advise and trying injections ok.

## 2015-12-12 NOTE — Telephone Encounter (Signed)
Patient returning your call.

## 2015-12-12 NOTE — Telephone Encounter (Signed)
Pt was told the other day and she wanted to follow up for her call from the other day. Pt was [facility] by [facility] physician and he told her that the right leg was worse than the other day. Pt states that the physician was wanting to see if they could start with injections. Pt states that she has an appointment next week for injections.Pt wanted to call and update PCP that she is still having some pain but she did not want any more medication. She wanted to let PCP be aware of the pain and please advise.

## 2015-12-13 MED ORDER — METHOCARBAMOL 500 MG PO TABS: 500.0000 mg | ORAL_TABLET | Freq: Every evening | ORAL | Status: DC | PRN

## 2015-12-13 MED ORDER — TRAZODONE HCL 100 MG PO TABS: ORAL_TABLET | ORAL | Status: DC

## 2015-12-13 MED ORDER — HYDROXYZINE PAMOATE 25 MG PO CAPS: 25.0000 mg | ORAL_CAPSULE | ORAL | Status: DC

## 2015-12-13 MED FILL — HYDROXYZINE PAM 25 MG CAP: 25 | 30 days supply | Qty: 30 | Fill #0

## 2015-12-13 MED FILL — METHOCARBAMOL 500 MG TABLET: 500 | 30 days supply | Qty: 30 | Fill #0

## 2015-12-13 MED FILL — traZODone HCL 100 MG TABS: 100 | 30 days supply | Qty: 30 | Fill #0

## 2015-12-13 NOTE — Telephone Encounter (Signed)
Spoke with pcp and Vistaril was sent in for pt to take 1 at bedtime as needed.

## 2015-12-13 NOTE — Telephone Encounter (Signed)
Spoke with pt and she states that she voices understanding. Pt states that she is willing to try pain management to help until she can see the orthopedist and get injections to help with the pain. Pt states she is also having some trouble sleeping at night and only getting about 2-3 hours of sleep.

## 2015-12-17 ENCOUNTER — Ambulatory Visit (INDEPENDENT_AMBULATORY_CARE_PROVIDER_SITE_OTHER): Payer: Medicare Other | Admitting: *Deleted

## 2015-12-17 DIAGNOSIS — I639 Cerebral infarction, unspecified: Secondary | ICD-10-CM | POA: Diagnosis not present

## 2015-12-17 MED FILL — ELIQUIS 5 MG TABLET: 5 | 90 days supply | Qty: 180 | Fill #0

## 2015-12-17 NOTE — Progress Notes (Signed)
Carelink Summary Report / Loop Recorder 

## 2015-12-19 ENCOUNTER — Telehealth: Payer: Self-pay | Admitting: Internal Medicine

## 2015-12-19 DIAGNOSIS — M25551 Pain in right hip: Secondary | ICD-10-CM | POA: Diagnosis not present

## 2015-12-19 DIAGNOSIS — M169 Osteoarthritis of hip, unspecified: Secondary | ICD-10-CM | POA: Diagnosis not present

## 2015-12-19 MED FILL — SERTRALINE HCL 50 MG TABLET: 50 | 30 days supply | Qty: 60 | Fill #2

## 2015-12-19 MED FILL — clonazePAM 0.5 MG TABS: 0.5 | 30 days supply | Qty: 60 | Fill #1

## 2015-12-19 NOTE — Telephone Encounter (Signed)
New message      Request for surgical clearance:  What type of surgery is being performed? Hip injection When is this surgery scheduled? Not scheduled---pending clearance Are there any medications that need to be held prior to surgery and how long? Hold eliquis 2 day prior? Name of physician performing surgery? Dr Laurian BrimBai 1. What is your office phone and fax number? Fax [phone]

## 2015-12-19 NOTE — Telephone Encounter (Signed)
Pt had a stroke in November 2017.  Will send to Dr. Johney FrameAllred to see if he is okay if she stops Eliquis or should postpone injection.

## 2015-12-20 ENCOUNTER — Ambulatory Visit: Payer: Medicare Other | Admitting: Medical

## 2015-12-20 NOTE — Telephone Encounter (Signed)
Would advise that we hold eliquis for 24 hours and resume as soon as possible

## 2015-12-21 NOTE — Telephone Encounter (Signed)
Clearance faxed to Dr. Laurian BrimBai.

## 2015-12-24 ENCOUNTER — Ambulatory Visit (HOSPITAL_BASED_OUTPATIENT_CLINIC_OR_DEPARTMENT_OTHER): Payer: Medicare Other

## 2015-12-27 ENCOUNTER — Ambulatory Visit (HOSPITAL_BASED_OUTPATIENT_CLINIC_OR_DEPARTMENT_OTHER)
Admission: RE | Admit: 2015-12-27 | Discharge: 2015-12-27 | Disposition: A | Discharge status: Home / Self Care | Payer: Medicare Other | Source: Ambulatory Visit | Attending: Medical | Admitting: Medical

## 2015-12-27 DIAGNOSIS — Z0000 Encounter for general adult medical examination without abnormal findings: Secondary | ICD-10-CM | POA: Insufficient documentation

## 2015-12-27 DIAGNOSIS — Z1231 Encounter for screening mammogram for malignant neoplasm of breast: Secondary | ICD-10-CM | POA: Insufficient documentation

## 2015-12-27 MED FILL — OMEPRAZOLE DR 20 MG CAPSULE: 20 | 30 days supply | Qty: 30 | Fill #1

## 2015-12-28 ENCOUNTER — Ambulatory Visit (INDEPENDENT_AMBULATORY_CARE_PROVIDER_SITE_OTHER): Payer: Medicare Other | Admitting: Medical

## 2015-12-28 ENCOUNTER — Other Ambulatory Visit: Payer: Self-pay | Admitting: Neurology

## 2015-12-28 ENCOUNTER — Encounter: Payer: Self-pay | Admitting: Medical

## 2015-12-28 VITALS — BP 120/64 | HR 88 | Temp 98.1°F | Ht 65.0 in | Wt 201.0 lb

## 2015-12-28 DIAGNOSIS — M546 Pain in thoracic spine: Secondary | ICD-10-CM

## 2015-12-28 DIAGNOSIS — R232 Flushing: Secondary | ICD-10-CM

## 2015-12-28 DIAGNOSIS — N951 Menopausal and female climacteric states: Secondary | ICD-10-CM

## 2015-12-28 DIAGNOSIS — R61 Generalized hyperhidrosis: Secondary | ICD-10-CM | POA: Diagnosis not present

## 2015-12-28 DIAGNOSIS — I639 Cerebral infarction, unspecified: Secondary | ICD-10-CM | POA: Diagnosis not present

## 2015-12-28 DIAGNOSIS — M25551 Pain in right hip: Secondary | ICD-10-CM

## 2015-12-28 DIAGNOSIS — M542 Cervicalgia: Secondary | ICD-10-CM | POA: Diagnosis not present

## 2015-12-28 DIAGNOSIS — Z79899 Other long term (current) drug therapy: Secondary | ICD-10-CM | POA: Diagnosis not present

## 2015-12-28 DIAGNOSIS — Z79891 Long term (current) use of opiate analgesic: Secondary | ICD-10-CM | POA: Diagnosis not present

## 2015-12-28 LAB — CBC WITH DIFFERENTIAL/PLATELET
BASOS PCT: 0.6 % (ref 0.0–3.0)
Basophils Absolute: 0.1 10*3/uL (ref 0.0–0.1)
EOS ABS: 0.2 10*3/uL (ref 0.0–0.7)
Eosinophils Relative: 1.9 % (ref 0.0–5.0)
HEMATOCRIT: 39.5 % (ref 36.0–46.0)
Hemoglobin: 13.1 g/dL (ref 12.0–15.0)
LYMPHS ABS: 2.1 10*3/uL (ref 0.7–4.0)
LYMPHS PCT: 20.4 % (ref 12.0–46.0)
MCHC: 33.3 g/dL (ref 30.0–36.0)
MCV: 93.4 fl (ref 78.0–100.0)
Monocytes Absolute: 0.8 10*3/uL (ref 0.1–1.0)
Monocytes Relative: 7.7 % (ref 3.0–12.0)
NEUTROS ABS: 7.2 10*3/uL (ref 1.4–7.7)
NEUTROS PCT: 69.4 % (ref 43.0–77.0)
PLATELETS: 199 10*3/uL (ref 150.0–400.0)
RBC: 4.23 Mil/uL (ref 3.87–5.11)
RDW: 14.3 % (ref 11.5–15.5)
WBC: 10.3 10*3/uL (ref 4.0–10.5)

## 2015-12-28 LAB — FOLLICLE STIMULATING HORMONE: FSH: 39.6 m[IU]/mL

## 2015-12-28 MED ORDER — OXYCODONE-ACETAMINOPHEN 5-325 MG PO TABS: ORAL_TABLET | ORAL | Status: DC

## 2015-12-28 MED FILL — ROSUVASTATIN CALCIUM 40 MG: 40 | 30 days supply | Qty: 30 | Fill #0

## 2015-12-28 MED FILL — GABAPENTIN 300 MG CAPSULE: 300 | 30 days supply | Qty: 90 | Fill #2

## 2015-12-28 MED FILL — OXYCODONE/APAP 5-325: 5-325 | 30 days supply | Qty: 90 | Fill #0

## 2015-12-28 NOTE — Patient Instructions (Addendum)
For hot flashes and diaphoresis will get fsh and cbc. For hot flashed pending labs get black kohosh.  For chronic pain will refill your pain medication.  For anxiety refill klonopin.  For insomnia try 2 of your hydroxyzine tabs and let me know if that works. You seem to think 1 hydroxyzine works better thatn trazadone. If 2 helps you sleep and no side effects could continue.  We will get UDS again. Both med need to be on screen since you took both within 24 hours.  Follow up in 1 month or as needed

## 2015-12-28 NOTE — Progress Notes (Signed)
Subjective:    Patient ID: Dawn Escobar, female    DOB: 1960/12/13, 55 y.o.   MRN: [id number]  HPI  Pt in for hot flashes. She states sweats all the time(hot flash like). Has ac on all the time. Pt states hysterectomy when 55 yr old. She still has her ovaries. Pt states about 4 months ago and has gotten worse. No infection symptoms reported.  Pt has been to specialist for her back, neck pain and hip pain. Pt states got injection in her rt hip. It helped for 3 days. But pain was worse.  Pt has taken her MRI of the neck to her orthopedist.  For low back pain pt is going to epidural.  They are recommending increasing gabapentin to 4 tab a day. Pt is trying to get in to see them earlier for epidural.   Pt last took her anxiety mediction this am.(klonopin). Pt last took percocet last night.    Review of Systems  Constitutional: Negative for fever, chills and fatigue.  Endocrine: Positive for heat intolerance.       Hot flashes.  Musculoskeletal: Negative for myalgias, back pain and neck stiffness.       See hpi.  Skin: Negative for rash.  Neurological: Negative for dizziness and headaches.  Hematological: Negative for adenopathy. Does not bruise/bleed easily.  Psychiatric/Behavioral: Negative for behavioral problems and confusion. The patient is nervous/anxious.     Past Medical History  Diagnosis Date  . PTSD (post-traumatic stress disorder)   . Raynaud's disease   . Anxiety   . Hypercholesteremia   . Stroke (HCC)   . GERD (gastroesophageal reflux disease) 04/25/2015  . Hereditary and idiopathic peripheral neuropathy 04/25/2015  . Hypertension   . Atrial fibrillation (HCC)   . Anxiety   . Migraine   . Stroke [facility])     Social History   Social History  . Marital Status: Single    Spouse Name: N/A  . Number of Children: 0  . Years of Education: 12th   Occupational History  . n/a    Social History Main Topics  . Smoking status: Former Smoker -- 0.20 packs/day   Types: Cigarettes  . Smokeless tobacco: Never Used  . Alcohol Use: 0.6 oz/week    1 Glasses of wine, 0 Standard drinks or equivalent per week     Comment: occasionally  . Drug Use: Yes    Special: Oxycodone     Comment: patient takes oxycodone for pain  . Sexual Activity: No     Comment: pt. reports that she believes she's going through the change; have no sex drive; hot flashes and excessive sweating for a couple of months now   Other Topics Concern  . Not on file   Social History Narrative   Patient lives at home alone.   Caffeine use: 1 cup of coffee and 20 oz of soda    Past Surgical History  Procedure Laterality Date  . Wrist surgery    . Abdominal hysterectomy    . Knee surgery    . Hip surgery  Right  . Tee without cardioversion N/A 09/05/2014    Procedure: TRANSESOPHAGEAL ECHOCARDIOGRAM (TEE);  Surgeon: Wendall Stade, MD;  Location: [facility] ENDOSCOPY;  Service: Cardiovascular;  Laterality: N/A;  loop after  . 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 Colon cancer Brother   . Heart  disease Mother   . Heart attack Father     Allergies  Allergen Reactions  . Tape Rash    No tape at all    Current Outpatient Prescriptions on File Prior to Visit  Medication Sig Dispense Refill  . acetaminophen (TYLENOL) 500 MG tablet Take 500 mg by mouth every 6 (six) hours as needed for mild pain, moderate pain or headache.    Marland Kitchen. apixaban (ELIQUIS) 5 MG TABS tablet Take 1 tablet (5 mg total) by mouth 2 (two) times daily. 180 tablet 3  . clonazePAM (KLONOPIN) 0.5 MG tablet Take 1 tablet (0.5 mg total) by mouth 2 (two) times daily as needed (anxiety). 60 tablet 1  . clotrimazole-betamethasone (LOTRISONE) cream Apply 1 application topically 2 (two) times daily. 30 g 1  . fluticasone (FLONASE) 50 MCG/ACT nasal spray Place 2 sprays into both nostrils daily. 16 g 1  . hydrOXYzine  (VISTARIL) 25 MG capsule Take 1 capsule (25 mg total) by mouth 1 day or 1 dose. Take 1 tablet at night time as needed for pain. 30 capsule 0  . methocarbamol (ROBAXIN) 500 MG tablet Take 1 tablet (500 mg total) by mouth at bedtime as needed for muscle spasms (take one daily by mouth at bedtime if needed). 30 tablet 0  . omeprazole (PRILOSEC) 20 MG capsule TAKE ONE CAPSULE BY MOUTH DAILY 30 capsule 2  . oxyCODONE-acetaminophen (ROXICET) 5-325 MG tablet 1 tab po every 8  as needed severe pain 90 tablet 0  . sertraline (ZOLOFT) 50 MG tablet Take 2 tablets (100 mg total) by mouth daily. 60 tablet 3  . traZODone (DESYREL) 100 MG tablet TAKE 1 TABLET (100 MG TOTAL) BY MOUTH AT BEDTIME. 30 tablet 2  . verapamil (CALAN-SR) 120 MG CR tablet Take 1 tablet (120 mg total) by mouth daily. 90 tablet 3   No current facility-administered medications on file prior to visit.    BP 120/64 mmHg  Pulse 88  Temp(Src) 98.1 F (36.7 C) (Oral)  Ht 5\' 5"  (1.651 m)  Wt 201 lb (91.173 kg)  BMI 33.45 kg/m2  SpO2 98%       Objective:   Physical Exam  General Mental Status- Alert. General Appearance- Not in acute distress.   Skin General: Color- Normal Color. Moisture- Normal Moisture.  Neck Carotid Arteries- Normal color. Moisture- Normal Moisture. No carotid bruits. No JVD.  Chest and Lung Exam Auscultation: Breath Sounds:-Normal.  Cardiovascular Auscultation:Rythm- Regular. Murmurs & Other Heart Sounds:Auscultation of the heart reveals- No Murmurs.  Abdomen Inspection:-Inspeection Normal. Palpation/Percussion:Note:No mass. Palpation and Percussion of the abdomen reveal- Non Tender, Non Distended + BS, no rebound or guarding.    Neurologic Cranial Nerve exam:- CN III-XII intact(No nystagmus), symmetric smile. Strength:- 5/5 equal and symmetric strength both upper and lower extremities.  Rt hip- pain on rotation, abudction and adduction.     Assessment & Plan:  For hot flashes and  diaphoresis will get fsh and cbc. For hot flashed pending labs get black kohosh.  For chronic pain will refill your pain medication.  For anxiety refill klonopin.  For insomnia try 2 of your hydroxyzine tabs and let me know if that works. You seem to think 1 hydroxyzine works better thatn trazadone. If 2 helps you sleep and no side effects could continue.  We will get UDS again. Both med need to be on screen since you took both within 24 hours.  Follow up in 1 month or as needed

## 2015-12-28 NOTE — Progress Notes (Signed)
Pre visit review using our clinic review tool, if applicable. No additional management support is needed unless otherwise documented below in the visit note. 

## 2015-12-31 NOTE — Progress Notes (Signed)
Quick Note:  Pt has seen results on MyChart and message also sent for patient to call back if any questions. ______ 

## 2016-01-03 DIAGNOSIS — M542 Cervicalgia: Secondary | ICD-10-CM | POA: Diagnosis not present

## 2016-01-03 DIAGNOSIS — M5412 Radiculopathy, cervical region: Secondary | ICD-10-CM | POA: Diagnosis not present

## 2016-01-03 DIAGNOSIS — M797 Fibromyalgia: Secondary | ICD-10-CM | POA: Diagnosis not present

## 2016-01-03 DIAGNOSIS — M545 Low back pain: Secondary | ICD-10-CM | POA: Diagnosis not present

## 2016-01-03 MED FILL — tiZANidine HCL 4 MG TABS: 4 | 30 days supply | Qty: 90 | Fill #0

## 2016-01-03 MED FILL — predniSONE 10 MG TABS: 10 | 12 days supply | Qty: 48 | Fill #0

## 2016-01-03 MED FILL — GABAPENTIN 800 MG TABLET: 800 | 30 days supply | Qty: 120 | Fill #0

## 2016-01-09 DIAGNOSIS — M545 Low back pain: Secondary | ICD-10-CM | POA: Diagnosis not present

## 2016-01-09 DIAGNOSIS — M5416 Radiculopathy, lumbar region: Secondary | ICD-10-CM | POA: Diagnosis not present

## 2016-01-11 MED ORDER — BUSPIRONE HCL 15 MG PO TABS: 15.0000 mg | ORAL_TABLET | Freq: Two times a day (BID) | ORAL | Status: DC

## 2016-01-11 MED FILL — busPIRone HCL 15 MG TABS: 15 | 5 days supply | Qty: 10 | Fill #0

## 2016-01-11 NOTE — Telephone Encounter (Signed)
Pt advised that negative drug screen for clonapin. Although I give her enough to take bid and she states took it the morning of drug screen. So I advised no further clonopin. Will try buspar. But not to use together with clonopin since she has 5 tab of clonopin left. Pt agreed.

## 2016-01-11 NOTE — Addendum Note (Signed)
Addended by: Neldon LabellaMABE, Licia Harl S on: 01/11/2016 04:59 PM   Modules accepted: Orders, Medications

## 2016-01-13 ENCOUNTER — Ambulatory Visit: Payer: Medicare Other

## 2016-01-14 ENCOUNTER — Ambulatory Visit (INDEPENDENT_AMBULATORY_CARE_PROVIDER_SITE_OTHER): Payer: Medicare Other | Admitting: *Deleted

## 2016-01-14 DIAGNOSIS — I639 Cerebral infarction, unspecified: Secondary | ICD-10-CM

## 2016-01-14 NOTE — Progress Notes (Signed)
Carelink Summary Report / Loop Recorder 

## 2016-01-15 NOTE — Telephone Encounter (Signed)
Can be reached: [phone]  Reason for call: pt called in to follow up on klonopin. She said that Ramon Dredgedward told her he would not fill it anymore and would discuss with his supervisor. She said that she needs something. Pt did indicate taking the buspirone 2/day.

## 2016-01-16 ENCOUNTER — Telehealth: Payer: Self-pay | Admitting: Medical

## 2016-01-16 MED ORDER — BUSPIRONE HCL 15 MG PO TABS: 15.0000 mg | ORAL_TABLET | Freq: Two times a day (BID) | ORAL | Status: DC

## 2016-01-16 MED FILL — busPIRone HCL 15 MG TABS: 15 | 30 days supply | Qty: 60 | Fill #0

## 2016-01-16 NOTE — Telephone Encounter (Signed)
Pt was aware that Rx of Buspar was sent into the pharmacy and she voices understanding that she is not able to get the Clonazepam any more per pcp since the drug screen is showing negative.

## 2016-01-16 NOTE — Telephone Encounter (Signed)
Edward please advise on note below.  

## 2016-01-16 NOTE — Telephone Encounter (Signed)
Let pt know have not discussed clonopin with Dr. Laury AxonLowne. But based on her 2 negative drug screens showing it was not in her system. Very unlikley we will give her anyfurther in that same class. Sorry.

## 2016-01-17 ENCOUNTER — Other Ambulatory Visit: Payer: Self-pay | Admitting: Medical

## 2016-01-17 MED FILL — traZODone HCL 100 MG TABS: 100 | 30 days supply | Qty: 30 | Fill #1

## 2016-01-17 MED FILL — METHOCARBAMOL 500 MG TABLET: 500 | 30 days supply | Qty: 30 | Fill #0

## 2016-01-17 MED FILL — SERTRALINE HCL 50 MG TABLET: 50 | 30 days supply | Qty: 60 | Fill #3

## 2016-01-21 ENCOUNTER — Other Ambulatory Visit: Payer: Self-pay | Admitting: Medical

## 2016-01-21 MED FILL — HYDROXYZINE PAM 25 MG CAP: 25 | 30 days supply | Qty: 30 | Fill #0

## 2016-01-21 MED FILL — OMEPRAZOLE DR 20 MG CAPSULE: 20 | 30 days supply | Qty: 30 | Fill #2

## 2016-01-22 ENCOUNTER — Encounter: Payer: Self-pay | Admitting: Medical

## 2016-01-29 ENCOUNTER — Telehealth: Payer: Self-pay | Admitting: Medical

## 2016-01-29 NOTE — Telephone Encounter (Signed)
Caller name: [facility] Relationship to patient: Can be reached:[phone] Pharmacy:  Reason for call: [facility] calling to make sure PCP was going to prescribe pain meds unitl the patient gets in with Pain Management. Please asvise

## 2016-01-29 NOTE — Telephone Encounter (Signed)
Spoke with pt and she voices understanding.  

## 2016-01-29 NOTE — Telephone Encounter (Signed)
Caller name:Jeannine Ceddia  Relation to ZO:XWRUEApt:sister Call back number:[phone]    Reason for call:   Sister would like to discuss anxiety medication

## 2016-01-29 NOTE — Telephone Encounter (Signed)
Still awaiting a appointment with [facility]PS, can take up to a month or two before patient is scheduled. Dawn Escobar has not given her any pain medicine.

## 2016-01-29 NOTE — Telephone Encounter (Signed)
I sent you message on pt pain med. Remind me tomorrow so I will print rx. Not printing today since not in office.

## 2016-01-29 NOTE — Telephone Encounter (Signed)
I wrote her last prescription. If they are not going to give her rx then I will. But can you give me update on her referral to pain management.

## 2016-01-29 NOTE — Telephone Encounter (Signed)
Call pt. Did ortho give her any pain medication the other day? Is she out of current rx?

## 2016-01-30 ENCOUNTER — Telehealth: Payer: Self-pay | Admitting: Medical

## 2016-01-30 MED ORDER — OXYCODONE-ACETAMINOPHEN 5-325 MG PO TABS: ORAL_TABLET | ORAL | Status: DC

## 2016-01-30 MED FILL — ROSUVASTATIN CALCIUM 40 MG: 40 | 30 days supply | Qty: 30 | Fill #1

## 2016-01-30 MED FILL — OXYCODONE/APAP 5-325: 5-325 | 30 days supply | Qty: 90 | Fill #0

## 2016-01-30 NOTE — Telephone Encounter (Signed)
Pt had 2 drug screens and clonazepam did not show on the screens although she states was taking. Therefore not writing. She was given buspar in place of. I am not going to talk with sister regarding this(Hippa??). We have already discussed with patient herself.

## 2016-01-30 NOTE — Telephone Encounter (Signed)
Notified pt that rx is available for pick up at front desk.

## 2016-01-30 NOTE — Telephone Encounter (Signed)
Refilled her oxycodone.

## 2016-01-30 NOTE — Telephone Encounter (Signed)
Please advise 

## 2016-01-31 DIAGNOSIS — M545 Low back pain: Secondary | ICD-10-CM | POA: Diagnosis not present

## 2016-01-31 DIAGNOSIS — M25551 Pain in right hip: Secondary | ICD-10-CM | POA: Diagnosis not present

## 2016-01-31 DIAGNOSIS — M542 Cervicalgia: Secondary | ICD-10-CM | POA: Diagnosis not present

## 2016-01-31 DIAGNOSIS — M5416 Radiculopathy, lumbar region: Secondary | ICD-10-CM | POA: Diagnosis not present

## 2016-01-31 MED FILL — NORTRIPTYLINE HCL 25 MG CAP: 25 | 30 days supply | Qty: 30 | Fill #0

## 2016-01-31 MED FILL — GABAPENTIN 800 MG TABLET: 800 | 30 days supply | Qty: 120 | Fill #0

## 2016-01-31 NOTE — Telephone Encounter (Signed)
Spoke with [name] and she is concerned about her sister Dawn Escobar. Jeannine does not want the pt back on the medication but she is concerned that Dawn Escobar has a mental illness and does not understand why the doctor took her off the medication. Please advise on the note below.

## 2016-01-31 NOTE — Telephone Encounter (Signed)
Caller name:Jeannine Ceddia  Relation to AV:WUJWJXpt:sister  Call back number:[phone]  Pt sister calling again stating she needs to have a discussion with Ramon DredgeEdward. She states the reason was confidential but she had called a couple days ago. I asked if it was regarding anxiety medication and she said yes. Fwd call to [place] due to notes from [place].

## 2016-02-01 ENCOUNTER — Ambulatory Visit (INDEPENDENT_AMBULATORY_CARE_PROVIDER_SITE_OTHER): Payer: Medicare Other | Admitting: Medical

## 2016-02-01 ENCOUNTER — Encounter: Payer: Self-pay | Admitting: Medical

## 2016-02-01 ENCOUNTER — Telehealth: Payer: Self-pay | Admitting: Medical

## 2016-02-01 VITALS — BP 122/78 | HR 67 | Temp 98.1°F | Ht 65.0 in | Wt 200.2 lb

## 2016-02-01 DIAGNOSIS — F4323 Adjustment disorder with mixed anxiety and depressed mood: Secondary | ICD-10-CM

## 2016-02-01 DIAGNOSIS — G894 Chronic pain syndrome: Secondary | ICD-10-CM | POA: Diagnosis not present

## 2016-02-01 DIAGNOSIS — I639 Cerebral infarction, unspecified: Secondary | ICD-10-CM | POA: Diagnosis not present

## 2016-02-01 NOTE — Addendum Note (Signed)
Addended by: Gwenevere AbbotSAGUIER, Araeya Lamb M on: 02/01/2016 02:14 PM   Modules accepted: Orders

## 2016-02-01 NOTE — Telephone Encounter (Signed)
Pt sister phone number is [phone]. You can give her name of practice that we are referring Angelique BlonderDenise to.

## 2016-02-01 NOTE — Progress Notes (Addendum)
Subjective:    Patient ID: Dawn Escobar, female    DOB: [birth year], 55 y.o.   MRN: [id number]  HPI  Pt in for follow up. Pt since her sister has been. Pt clonazepam was not in her system despite the fact that I have tested her 2 times. I tested her twice giving her the benefit of the doubt. Pt states she has been taking.  The last drug screen I asked specifically when she took the last clonzepam. She told me this am. Then the drug screen came back negative.  Pt has seen Dr. Laurian BrimBai. He will take over oxycodone after she finishes current rx of 90 tabs of oxycodone on 01-30-2016.   Jeanine Ceddia  cell [phone].   Review of Systems  Constitutional: Negative for fever, chills, diaphoresis and fatigue.  Respiratory: Negative for cough, chest tightness, shortness of breath and wheezing.   Cardiovascular: Negative for chest pain and palpitations.  Skin: Negative for rash.  Hematological: Negative for adenopathy. Does not bruise/bleed easily.  Psychiatric/Behavioral: Negative for suicidal ideas, behavioral problems, confusion, self-injury, decreased concentration and agitation. The patient is nervous/anxious.     Past Medical History  Diagnosis Date  . PTSD (post-traumatic stress disorder)   . Raynaud's disease   . Anxiety   . Hypercholesteremia   . Stroke (HCC)   . GERD (gastroesophageal reflux disease) 04/25/2015  . Hereditary and idiopathic peripheral neuropathy 04/25/2015  . Hypertension   . Atrial fibrillation (HCC)   . Anxiety   . Migraine   . Stroke [facility](HCC)      Social History   Social History  . Marital Status: Single    Spouse Name: N/A  . Number of Children: 0  . Years of Education: 12th   Occupational History  . n/a    Social History Main Topics  . Smoking status: Former Smoker -- 0.20 packs/day    Types: Cigarettes  . Smokeless tobacco: Never Used  . Alcohol Use: 0.6 oz/week    1 Glasses of wine, 0 Standard drinks or equivalent per week     Comment:  occasionally  . Drug Use: Yes    Special: Oxycodone     Comment: patient takes oxycodone for pain  . Sexual Activity: No     Comment: pt. reports that she believes she's going through the change; have no sex drive; hot flashes and excessive sweating for a couple of months now   Other Topics Concern  . Not on file   Social History Narrative   Patient lives at home alone.   Caffeine use: 1 cup of coffee and 20 oz of soda    Past Surgical History  Procedure Laterality Date  . Wrist surgery    . Abdominal hysterectomy    . Knee surgery    . Hip surgery  Right  . Tee without cardioversion N/A 09/05/2014    Procedure: TRANSESOPHAGEAL ECHOCARDIOGRAM (TEE);  Surgeon: Wendall StadePeter C Nishan, MD;  [facility];  Service: Cardiovascular;  Laterality: N/A;  loop after  . Loop recorder implant N/A 09/05/2014    MDT LINQ implanted by Dr Johney FrameAllred for cryptogenic stroke    Family History  Problem Relation Age of Onset  . Colon cancer Brother   . Heart disease Mother   . Heart attack Father     Allergies  Allergen Reactions  . Tape Rash    No tape at all    Current Outpatient Prescriptions on File Prior to Visit  Medication Sig Dispense Refill  .  acetaminophen (TYLENOL) 500 MG tablet Take 500 mg by mouth every 6 (six) hours as needed for mild pain, moderate pain or headache.    Marland Kitchen apixaban (ELIQUIS) 5 MG TABS tablet Take 1 tablet (5 mg total) by mouth 2 (two) times daily. 180 tablet 3  . busPIRone (BUSPAR) 15 MG tablet Take 1 tablet (15 mg total) by mouth 2 (two) times daily. 60 tablet 1  . clotrimazole-betamethasone (LOTRISONE) cream Apply 1 application topically 2 (two) times daily. 30 g 1  . fluticasone (FLONASE) 50 MCG/ACT nasal spray Place 2 sprays into both nostrils daily. 16 g 1  . hydrOXYzine (VISTARIL) 25 MG capsule TAKE 1 CAPSULE (25 MG TOTAL) BY MOUTH AT NIGHT TIME AS NEEDED 30 capsule 0  . methocarbamol (ROBAXIN) 500 MG tablet TAKE 1 TABLET (500 MG TOTAL) BY MOUTH AT BEDTIME  AS NEEDED FOR MUSCLE SPASMS (TAKE ONE DAILY BY MOUTH AT BEDTIME IF NEEDED). 30 tablet 0  . omeprazole (PRILOSEC) 20 MG capsule TAKE ONE CAPSULE BY MOUTH DAILY 30 capsule 2  . oxyCODONE-acetaminophen (ROXICET) 5-325 MG tablet 1 tab po every 8  as needed severe pain 90 tablet 0  . rosuvastatin (CRESTOR) 40 MG tablet TAKE 1 TABLET BY MOUTH DAILY 30 tablet 1  . sertraline (ZOLOFT) 50 MG tablet Take 2 tablets (100 mg total) by mouth daily. 60 tablet 3  . traZODone (DESYREL) 100 MG tablet TAKE 1 TABLET (100 MG TOTAL) BY MOUTH AT BEDTIME. 30 tablet 2  . verapamil (CALAN-SR) 120 MG CR tablet Take 1 tablet (120 mg total) by mouth daily. 90 tablet 3   No current facility-administered medications on file prior to visit.    BP 122/78 mmHg  Pulse 67  Temp(Src) 98.1 F (36.7 C) (Oral)  Ht  (1.651 m)  Wt 200 lb 3.2 oz (90.81 kg)  BMI 33.31 kg/m2  SpO2 98%       Objective:   Physical Exam   General Mental Status- Alert. General Appearance- Not in acute distress.    Chest and Lung Exam Auscultation: Breath Sounds:-Normal.  Cardiovascular Auscultation:Rythm- Regular. Murmurs & Other Heart Sounds:Auscultation of the heart reveals- No Murmurs.       Assessment & Plan:  For your chronic pain. We will hand over care of that to Dr. Laurian Brim. He will write controlled pain meds for you from here on. I asked Francesco Runner MA to call there office and confirm with them.  For you anxiety we will continue buspar. As I explained I can't prescribe clonzepam. I am gettting toxicology to fax over all prior test again. Currently most recent results appear to be in scanning process. I have discussed this with office manager. I will try to talk with your sister then she can talk to our office Manager if she wishes.  Follow up in one month or as needed  Majority of time spent counseling pt on her negative drug screens and why I can't write the clonezepam.  I did talk with pt sister today. She revealed Ruthy Forry has possible schizophrenia and has in recent past before moving to [place] thought persons were out to kill her. She states she has bodyguards and thinks hr niece has visited her in [place]. But that is not the case. Pt has hx of stroke, ptds, anxiety, depression and recently failed drug screens on her clonazepam. She may have gotten confused and thought she was taking and did not. Pt is now on buspar. Can she be scheduled stat.  Memorie Yokoyama, Ramon Dredge  PA-C

## 2016-02-01 NOTE — Progress Notes (Signed)
Pre visit review using our clinic review tool, if applicable. No additional management support is needed unless otherwise documented below in the visit note. 

## 2016-02-01 NOTE — Patient Instructions (Addendum)
For your chronic pain. We will hand over care of that to Dr. Laurian BrimBai. He will write controlled pain meds for you from here on. I asked Dawn Escobar to call there office and confirm with them.  For you anxiety we will continue buspar. As I explained I can't prescribe clonzepam. I am gettting toxicology to fax over all prior test again. Currently most recent results appear to be in scanning process. I have discussed this with office manager. I will try to talk with your sister then she can talk to our office Manager if she wishes.  Follow up in one month or as needed  I did talk with pt sister today. She revealed Dawn Escobar did talk with pt sister today. She revealed Dawn Escobar has possible schizophrenia and has in recent past before moving to [place] thought persons were out to kill her. She states she has bodyguards and thinks hr niece has visited her in [place]. But that is not the case. Pt has hx of stroke, ptds, anxiety, depression and recently failed drug screens on her clonazepam. She may have gotten confused and thought she was taking and did not. Pt is now on buspar. Can she be scheduled stats possible schizophrenia and has in recent past before moving to [place] thought persons were out to kill her. She states she has bodyguards and thinks hr niece has visited her in [place]. But that is not the case. Pt has hx of stroke, ptds, anxiety, depression and recently failed drug screens on her clonazepam. She may have gotten confused and thought she was taking and did not. Pt is now on buspar. Can she be scheduled stat.

## 2016-02-04 ENCOUNTER — Telehealth: Payer: Self-pay

## 2016-02-04 NOTE — Telephone Encounter (Signed)
Spoke with Clydie BraunKaren at [facility] and she states that Dr. Laurian BrimBai is out of the office today and he will be back until tomorrow 02/05/16 and she also advised that Ms. Hersch had also called the office and was checking on the status of the medication change as well. Will call back tomorrow 02/05/16.

## 2016-02-05 NOTE — Telephone Encounter (Signed)
Patient to check the status of the phone call between Ramon DredgeEdward and Dr. Laurian BrimBai she said they called  And the nurse  Joni ReiningNicole gave her this number for Provider to call to talk to [place]ai [phone]

## 2016-02-05 NOTE — Telephone Encounter (Signed)
I tried to call Dr. Laurian BrimBai office and inform him that I am sending pt for psychiatric evaluation. I wanted to inform him of concerns of pt family members regarding psychiatric diagnosis. In light of fact he had plans on prescribing embeda in near future. Wanted him aware of this.  Ended up speaking to MA(kim) at Dr. Laurian BrimBai office and they are aware of situation.

## 2016-02-05 NOTE — Telephone Encounter (Signed)
Spoke with nicole and I advised her that per the last note from [employer] S[employer] that Dr. Laurian BrimBai would take over her pain management when Philipp Ovensenise Mcmanamon establishes with them. I also advised the pt that E. Saguier spoke with Dr. Laurian BrimBai and they will be taking over her pain management and she voices understanding.

## 2016-02-06 DIAGNOSIS — H04123 Dry eye syndrome of bilateral lacrimal glands: Secondary | ICD-10-CM | POA: Diagnosis not present

## 2016-02-06 DIAGNOSIS — H31091 Other chorioretinal scars, right eye: Secondary | ICD-10-CM | POA: Diagnosis not present

## 2016-02-06 DIAGNOSIS — I639 Cerebral infarction, unspecified: Secondary | ICD-10-CM | POA: Diagnosis not present

## 2016-02-06 DIAGNOSIS — Z961 Presence of intraocular lens: Secondary | ICD-10-CM | POA: Diagnosis not present

## 2016-02-06 DIAGNOSIS — H53483 Generalized contraction of visual field, bilateral: Secondary | ICD-10-CM | POA: Diagnosis not present

## 2016-02-12 ENCOUNTER — Ambulatory Visit (INDEPENDENT_AMBULATORY_CARE_PROVIDER_SITE_OTHER): Payer: Medicare Other | Admitting: *Deleted

## 2016-02-12 DIAGNOSIS — I639 Cerebral infarction, unspecified: Secondary | ICD-10-CM | POA: Diagnosis not present

## 2016-02-13 NOTE — Progress Notes (Signed)
Carelink Summary Report / Loop Recorder 

## 2016-02-14 ENCOUNTER — Other Ambulatory Visit: Payer: Self-pay | Admitting: Neurology

## 2016-02-14 MED FILL — tiZANidine HCL 4 MG TABS: 4 | 30 days supply | Qty: 90 | Fill #0

## 2016-02-14 MED FILL — busPIRone HCL 15 MG TABS: 15 | 30 days supply | Qty: 60 | Fill #1

## 2016-02-14 MED FILL — SERTRALINE HCL 50 MG TABLET: 50 | 30 days supply | Qty: 60 | Fill #0

## 2016-02-18 ENCOUNTER — Other Ambulatory Visit: Payer: Self-pay | Admitting: Medical

## 2016-02-18 MED FILL — METHOCARBAMOL 500 MG TABLET: 500 | 30 days supply | Qty: 30 | Fill #0

## 2016-02-18 MED FILL — HYDROXYZINE PAM 25 MG CAP: 25 | 30 days supply | Qty: 30 | Fill #0

## 2016-02-21 MED FILL — VERAPAMIL ER 120 MG TABLET: 120 | 90 days supply | Qty: 90 | Fill #1

## 2016-02-21 MED FILL — traZODone HCL 100 MG TABS: 100 | 30 days supply | Qty: 30 | Fill #2

## 2016-02-26 DIAGNOSIS — M545 Low back pain: Secondary | ICD-10-CM | POA: Diagnosis not present

## 2016-02-26 DIAGNOSIS — M5416 Radiculopathy, lumbar region: Secondary | ICD-10-CM | POA: Diagnosis not present

## 2016-02-26 DIAGNOSIS — M542 Cervicalgia: Secondary | ICD-10-CM | POA: Diagnosis not present

## 2016-02-26 DIAGNOSIS — M5412 Radiculopathy, cervical region: Secondary | ICD-10-CM | POA: Diagnosis not present

## 2016-02-26 MED FILL — fentaNYL 12 MCG/HR PT72: 12 | 30 days supply | Qty: 10 | Fill #0

## 2016-02-27 ENCOUNTER — Other Ambulatory Visit: Payer: Self-pay | Admitting: Medical

## 2016-02-27 LAB — CUP PACEART REMOTE DEVICE CHECK: MDC IDC SESS DTM: 20170310193811

## 2016-02-27 MED FILL — OMEPRAZOLE DR 20 MG CAPSULE: 20 | 30 days supply | Qty: 30 | Fill #0

## 2016-02-27 MED FILL — NORTRIPTYLINE HCL 25 MG CAP: 25 | 30 days supply | Qty: 60 | Fill #0

## 2016-02-28 MED FILL — METHOCARBAMOL 500 MG TABLET: 500 | 30 days supply | Qty: 90 | Fill #0

## 2016-03-02 LAB — CUP PACEART REMOTE DEVICE CHECK: Date Time Interrogation Session: 20170409200709

## 2016-03-02 NOTE — Progress Notes (Signed)
Carelink summary report received. Battery status OK. Normal device function. No new symptom episodes, tachy episodes, brady, or pause episodes. No new AF episodes. Monthly summary reports and ROV/PRN 

## 2016-03-04 ENCOUNTER — Other Ambulatory Visit: Payer: Self-pay | Admitting: Neurology

## 2016-03-04 MED FILL — GABAPENTIN 800 MG TABLET: 800 | 30 days supply | Qty: 120 | Fill #0

## 2016-03-04 MED FILL — ROSUVASTATIN CALCIUM 40 MG: 40 | 30 days supply | Qty: 30 | Fill #0

## 2016-03-04 MED FILL — ELIQUIS 5 MG TABLET: 5 | 90 days supply | Qty: 180 | Fill #1

## 2016-03-05 ENCOUNTER — Ambulatory Visit (INDEPENDENT_AMBULATORY_CARE_PROVIDER_SITE_OTHER): Payer: Medicare Other | Admitting: Medical

## 2016-03-05 ENCOUNTER — Encounter: Payer: Self-pay | Admitting: Medical

## 2016-03-05 VITALS — BP 114/76 | HR 71 | Temp 98.1°F | Ht 65.0 in | Wt 208.4 lb

## 2016-03-05 DIAGNOSIS — Z23 Encounter for immunization: Secondary | ICD-10-CM

## 2016-03-05 DIAGNOSIS — F4323 Adjustment disorder with mixed anxiety and depressed mood: Secondary | ICD-10-CM

## 2016-03-05 DIAGNOSIS — M542 Cervicalgia: Secondary | ICD-10-CM | POA: Diagnosis not present

## 2016-03-05 DIAGNOSIS — M5410 Radiculopathy, site unspecified: Secondary | ICD-10-CM

## 2016-03-05 DIAGNOSIS — I639 Cerebral infarction, unspecified: Secondary | ICD-10-CM | POA: Diagnosis not present

## 2016-03-05 DIAGNOSIS — M792 Neuralgia and neuritis, unspecified: Secondary | ICD-10-CM

## 2016-03-05 NOTE — Patient Instructions (Addendum)
I am going to refill you buspar today. I am giving you the name of psychiatrist offices. Please call there office. Let us know who you have called. (Please sign Hippa form so we can send records to there office)  For chronic pain continue med Dr. Laurian BrimBai  rx'd call his office for med changes.  We gave you tetanus update today.  Follow up in 1-2 months or as needed

## 2016-03-05 NOTE — Progress Notes (Signed)
Subjective:    Patient ID: Philipp Ovensenise Pillard, female    DOB: [birth year], 55 y.o.   MRN: [id number]  HPI   I have seen pt for her anxiety and mood disorder.   Pt sister gave me insight into pt mental health history. I did refer pt to psychiatry. She states no one every called her. Pt has anxiety and I had her on Buspar. She was on clonazepam in the past. Pt still has buspar  but needs refills since she states running out soon. Overall all pt seems less anxious and not reporting depression today.   Pt has seen Dr. Laurian BrimBai for chronic pain. He put her on fentanyl patch. She states it is not working. She states will call him today to let him know if pain is being controlled.  Pt states her pain area is worse in neck and left arm. He states pain is radicular type pain. Pt may need neurosurgeon per pt report(pt stats Dr. Laurian BrimBai opinion). Pt states Dr. Laurian BrimBai also refill her robaxin. She is also on gabapentin.  Pt has hyperlipidemia. Dr. Pearlean BrownieSethi refilled her crestor.  Pt needs tetanus update.     Review of Systems  Constitutional: Negative for fever, chills and fatigue.  Respiratory: Negative for cough, shortness of breath and wheezing.   Cardiovascular: Negative for chest pain and palpitations.  Gastrointestinal: Negative for abdominal pain.  Musculoskeletal:       See hpi. Neck pain and left radicular pain.  Neurological: Negative for dizziness and headaches.  Hematological: Negative for adenopathy. Does not bruise/bleed easily.  Psychiatric/Behavioral: Negative for behavioral problems and confusion. The patient is nervous/anxious.        Mood seems better today compared to last visit.     Past Medical History  Diagnosis Date  . PTSD (post-traumatic stress disorder)   . Raynaud's disease   . Anxiety   . Hypercholesteremia   . Stroke (HCC)   . GERD (gastroesophageal reflux disease) 04/25/2015  . Hereditary and idiopathic peripheral neuropathy 04/25/2015  . Hypertension   . Atrial  fibrillation (HCC)   . Anxiety   . Migraine   . Stroke [facility](HCC)      Social History   Social History  . Marital Status: Single    Spouse Name: N/A  . Number of Children: 0  . Years of Education: 12th   Occupational History  . n/a    Social History Main Topics  . Smoking status: Former Smoker -- 0.20 packs/day    Types: Cigarettes  . Smokeless tobacco: Never Used  . Alcohol Use: 0.6 oz/week    1 Glasses of wine, 0 Standard drinks or equivalent per week     Comment: occasionally  . Drug Use: Yes    Special: Oxycodone     Comment: patient takes oxycodone for pain  . Sexual Activity: No     Comment: pt. reports that she believes she's going through the change; have no sex drive; hot flashes and excessive sweating for a couple of months now   Other Topics Concern  . Not on file   Social History Narrative   Patient lives at home alone.   Caffeine use: 1 cup of coffee and 20 oz of soda    Past Surgical History  Procedure Laterality Date  . Wrist surgery    . Abdominal hysterectomy    . Knee surgery    . Hip surgery  Right  . Tee without cardioversion N/A 09/05/2014    Procedure: TRANSESOPHAGEAL ECHOCARDIOGRAM (TEE);  Surgeon: Wendall Stade, MD;  Location: [facility] ENDOSCOPY;  Service: Cardiovascular;  Laterality: N/A;  loop after  . 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 Colon cancer Brother   . Heart disease Mother   . Heart attack Father     Allergies  Allergen Reactions  . Tape Rash    No tape at all    Current Outpatient Prescriptions on File Prior to Visit  Medication Sig Dispense Refill  . acetaminophen (TYLENOL) 500 MG tablet Take 500 mg by mouth every 6 (six) hours as needed for mild pain, moderate pain or headache.    Marland Kitchen apixaban (ELIQUIS) 5 MG TABS tablet Take 1 tablet (5 mg total) by mouth 2 (two) times daily. 180 tablet 3  . busPIRone (BUSPAR) 15 MG  tablet Take 1 tablet (15 mg total) by mouth 2 (two) times daily. 60 tablet 1  . clotrimazole-betamethasone (LOTRISONE) cream Apply 1 application topically 2 (two) times daily. 30 g 1  . fluticasone (FLONASE) 50 MCG/ACT nasal spray Place 2 sprays into both nostrils daily. 16 g 1  . gabapentin (NEURONTIN) 800 MG tablet Take 800 mg by mouth 4 (four) times daily.     . hydrOXYzine (VISTARIL) 25 MG capsule TAKE 1 CAPSULE (25 MG TOTAL) BY MOUTH AT NIGHT TIME AS NEEDED 30 capsule 0  . methocarbamol (ROBAXIN) 500 MG tablet TAKE 1 TABLET (500 MG TOTAL) BY MOUTH AT BEDTIME AS NEEDED FOR MUSCLE SPASMS (TAKE ONE DAILY BY MOUTH AT BEDTIME IF NEEDED). 30 tablet 0  . omeprazole (PRILOSEC) 20 MG capsule TAKE 1 CAPSULE BY MOUTH DAILY 30 capsule 2  . oxyCODONE-acetaminophen (ROXICET) 5-325 MG tablet 1 tab po every 8  as needed severe pain 90 tablet 0  . rosuvastatin (CRESTOR) 40 MG tablet TAKE 1 TABLET BY MOUTH DAILY 30 tablet 11  . sertraline (ZOLOFT) 50 MG tablet TAKE 2 TABLETS (100 MG TOTAL) BY MOUTH DAILY. 60 tablet 11  . traZODone (DESYREL) 100 MG tablet TAKE 1 TABLET (100 MG TOTAL) BY MOUTH AT BEDTIME. 30 tablet 2  . verapamil (CALAN-SR) 120 MG CR tablet Take 1 tablet (120 mg total) by mouth daily. 90 tablet 3   No current facility-administered medications on file prior to visit.    BP 114/76 mmHg  Pulse 71  Temp(Src) 98.1 F (36.7 C) (Oral)  Ht  (1.651 m)  Wt 208 lb 6.4 oz (94.53 kg)  BMI 34.68 kg/m2  SpO2 98%       Objective:   Physical Exam   General Mental Status- Alert. General Appearance- Not in acute distress.   Skin General: Color- Normal Color. Moisture- Normal Moisture.  Neck Faint mid c-spine tenderness and left trapezius tenderness to palpation.  Chest and Lung Exam Auscultation: Breath Sounds:-Normal.  Cardiovascular Auscultation:Rythm- Regular. Murmurs & Other Heart Sounds:Auscultation of the heart reveals- No Murmurs.  Abdomen Inspection:-Inspeection  Normal. Palpation/Percussion:Note:No mass. Palpation and Percussion of the abdomen reveal- Non Tender, Non Distended + BS, no rebound or guarding.   Neurologic Cranial Nerve exam:- CN III-XII intact(No nystagmus), symmetric smile. 5/5 symmetric grip strength bilateral.     Assessment & Plan:  I am going to refill you buspar today. I am giving you the name of psychiatrist offices. Please call there office. Let us know who you have called. (Please sign Hippa form so we can send records to there office)  For chronic pain continue med Dr. Laurian Brim  rx'd call his office for med changes.  We gave you tetanus update today.  Follow up in 1-2 months or as needed

## 2016-03-05 NOTE — Addendum Note (Signed)
Addended by: Neldon LabellaMABE, Macedonio Scallon S on: 03/05/2016 04:25 PM   Modules accepted: Medications

## 2016-03-05 NOTE — Addendum Note (Signed)
Addended by: Neldon LabellaMABE, HOLDEN S on: 03/05/2016 02:30 PM   Modules accepted: Orders

## 2016-03-05 NOTE — Progress Notes (Signed)
Pre visit review using our clinic review tool, if applicable. No additional management support is needed unless otherwise documented below in the visit note. 

## 2016-03-13 ENCOUNTER — Ambulatory Visit (INDEPENDENT_AMBULATORY_CARE_PROVIDER_SITE_OTHER): Payer: Medicare Other | Admitting: *Deleted

## 2016-03-13 DIAGNOSIS — I639 Cerebral infarction, unspecified: Secondary | ICD-10-CM | POA: Diagnosis not present

## 2016-03-14 NOTE — Progress Notes (Signed)
Carelink Summary Report / Loop Recorder 

## 2016-03-18 ENCOUNTER — Other Ambulatory Visit: Payer: Self-pay | Admitting: Medical

## 2016-03-18 DIAGNOSIS — M542 Cervicalgia: Secondary | ICD-10-CM | POA: Diagnosis not present

## 2016-03-18 DIAGNOSIS — M5416 Radiculopathy, lumbar region: Secondary | ICD-10-CM | POA: Diagnosis not present

## 2016-03-18 DIAGNOSIS — M545 Low back pain: Secondary | ICD-10-CM | POA: Diagnosis not present

## 2016-03-18 DIAGNOSIS — M5412 Radiculopathy, cervical region: Secondary | ICD-10-CM | POA: Diagnosis not present

## 2016-03-18 MED FILL — busPIRone HCL 15 MG TABS: 15 | 30 days supply | Qty: 60 | Fill #0

## 2016-03-18 MED FILL — HYDROXYZINE PAM 25 MG CAP: 25 | 30 days supply | Qty: 30 | Fill #0

## 2016-03-18 MED FILL — NORTRIPTYLINE HCL 75 MG CAP: 75 | 30 days supply | Qty: 30 | Fill #0

## 2016-03-18 MED FILL — fentaNYL 37.5 MCG/HR PT72: 37.5 | 30 days supply | Qty: 10 | Fill #0

## 2016-03-18 MED FILL — SERTRALINE HCL 50 MG TABLET: 50 | 30 days supply | Qty: 60 | Fill #1

## 2016-03-21 LAB — CUP PACEART REMOTE DEVICE CHECK
Date Time Interrogation Session: 20170509201538
MDC IDC SESS DTM: 20170608203532

## 2016-03-25 MED FILL — OMEPRAZOLE DR 20 MG CAPSULE: 20 | 30 days supply | Qty: 30 | Fill #1

## 2016-03-31 MED FILL — tiZANidine HCL 4 MG TABS: 4 | 30 days supply | Qty: 90 | Fill #0

## 2016-04-04 ENCOUNTER — Other Ambulatory Visit: Payer: Self-pay | Admitting: Medical

## 2016-04-04 MED FILL — traZODone HCL 100 MG TABS: 100 | 30 days supply | Qty: 30 | Fill #0

## 2016-04-04 NOTE — Telephone Encounter (Signed)
Rx sent to pharmacy and Dawn Escobar was advised that the Rx for trazadone was sent in and pt was on the way down.

## 2016-04-07 DIAGNOSIS — H40013 Open angle with borderline findings, low risk, bilateral: Secondary | ICD-10-CM | POA: Diagnosis not present

## 2016-04-07 DIAGNOSIS — H04123 Dry eye syndrome of bilateral lacrimal glands: Secondary | ICD-10-CM | POA: Diagnosis not present

## 2016-04-07 DIAGNOSIS — H31091 Other chorioretinal scars, right eye: Secondary | ICD-10-CM | POA: Diagnosis not present

## 2016-04-07 DIAGNOSIS — Z961 Presence of intraocular lens: Secondary | ICD-10-CM | POA: Diagnosis not present

## 2016-04-07 DIAGNOSIS — I639 Cerebral infarction, unspecified: Secondary | ICD-10-CM | POA: Diagnosis not present

## 2016-04-09 MED FILL — GABAPENTIN 800 MG TABLET: 800 | 30 days supply | Qty: 120 | Fill #1

## 2016-04-10 MED FILL — METHOCARBAMOL 750 MG TABLET: 750 | 30 days supply | Qty: 90 | Fill #0

## 2016-04-11 MED FILL — busPIRone HCL 15 MG TABS: 15 | 30 days supply | Qty: 60 | Fill #1

## 2016-04-11 MED FILL — ROSUVASTATIN CALCIUM 40 MG: 40 | 30 days supply | Qty: 30 | Fill #1

## 2016-04-14 ENCOUNTER — Ambulatory Visit (INDEPENDENT_AMBULATORY_CARE_PROVIDER_SITE_OTHER): Payer: Medicare Other | Admitting: *Deleted

## 2016-04-14 DIAGNOSIS — I639 Cerebral infarction, unspecified: Secondary | ICD-10-CM | POA: Diagnosis not present

## 2016-04-14 NOTE — Progress Notes (Signed)
Carelink Summary Report / Loop Recorder 

## 2016-04-15 ENCOUNTER — Other Ambulatory Visit: Payer: Self-pay | Admitting: Medical

## 2016-04-15 MED FILL — NORTRIPTYLINE HCL 75 MG CAP: 75 | 30 days supply | Qty: 30 | Fill #0

## 2016-04-15 MED FILL — HYDROXYZINE PAM 25 MG CAP: 25 | 30 days supply | Qty: 30 | Fill #0

## 2016-04-15 MED FILL — SERTRALINE HCL 50 MG TABLET: 50 | 30 days supply | Qty: 60 | Fill #2

## 2016-04-17 DIAGNOSIS — M542 Cervicalgia: Secondary | ICD-10-CM | POA: Diagnosis not present

## 2016-04-17 DIAGNOSIS — M5412 Radiculopathy, cervical region: Secondary | ICD-10-CM | POA: Diagnosis not present

## 2016-04-17 DIAGNOSIS — M545 Low back pain: Secondary | ICD-10-CM | POA: Diagnosis not present

## 2016-04-17 DIAGNOSIS — M5416 Radiculopathy, lumbar region: Secondary | ICD-10-CM | POA: Diagnosis not present

## 2016-04-17 MED FILL — fentaNYL 37.5 MCG/HR PT72: 37.5 | 30 days supply | Qty: 10 | Fill #0

## 2016-04-23 ENCOUNTER — Encounter: Payer: Self-pay | Admitting: Medical

## 2016-04-28 ENCOUNTER — Ambulatory Visit (HOSPITAL_COMMUNITY): Payer: Medicare Other | Admitting: Licensed Clinical Social Worker

## 2016-04-28 ENCOUNTER — Encounter (HOSPITAL_COMMUNITY): Payer: Self-pay

## 2016-04-28 MED FILL — OMEPRAZOLE DR 20 MG CAPSULE: 20 | 30 days supply | Qty: 30 | Fill #2

## 2016-04-30 ENCOUNTER — Ambulatory Visit (INDEPENDENT_AMBULATORY_CARE_PROVIDER_SITE_OTHER): Payer: Medicare Other | Admitting: Licensed Clinical Social Worker

## 2016-04-30 ENCOUNTER — Encounter (HOSPITAL_COMMUNITY): Payer: Self-pay

## 2016-04-30 DIAGNOSIS — F4323 Adjustment disorder with mixed anxiety and depressed mood: Secondary | ICD-10-CM
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
## 2016-05-02 MED FILL — traZODone HCL 100 MG TABS: 100 | 30 days supply | Qty: 30 | Fill #1

## 2016-05-05 MED FILL — GABAPENTIN 800 MG TABLET: 800 | 30 days supply | Qty: 120 | Fill #2

## 2016-05-06 MED FILL — VERAPAMIL ER 120 MG TABLET: 120 | 90 days supply | Qty: 90 | Fill #2

## 2016-05-07 LAB — CUP PACEART REMOTE DEVICE CHECK: MDC IDC SESS DTM: 20170708214110

## 2016-05-07 NOTE — Progress Notes (Signed)
Carelink summary report received. Battery status OK. Normal device function. No new symptom episodes, tachy episodes, brady, or pause episodes. No new AF episodes. Monthly summary reports and ROV/PRN 

## 2016-05-12 ENCOUNTER — Ambulatory Visit (INDEPENDENT_AMBULATORY_CARE_PROVIDER_SITE_OTHER): Payer: Medicare Other | Admitting: *Deleted

## 2016-05-12 DIAGNOSIS — I639 Cerebral infarction, unspecified: Secondary | ICD-10-CM

## 2016-05-13 NOTE — Progress Notes (Signed)
Carelink Summary Report / Loop Recorder 

## 2016-05-14 ENCOUNTER — Ambulatory Visit: Payer: Medicare Other | Admitting: Medical

## 2016-05-15 ENCOUNTER — Other Ambulatory Visit: Payer: Self-pay | Admitting: Medical

## 2016-05-15 DIAGNOSIS — M545 Low back pain: Secondary | ICD-10-CM | POA: Diagnosis not present

## 2016-05-15 DIAGNOSIS — M542 Cervicalgia: Secondary | ICD-10-CM | POA: Diagnosis not present

## 2016-05-15 MED FILL — tiZANidine HCL 4 MG TABS: 4 | 30 days supply | Qty: 90 | Fill #0

## 2016-05-15 MED FILL — SERTRALINE HCL 50 MG TABLET: 50 | 30 days supply | Qty: 60 | Fill #3

## 2016-05-15 MED FILL — NORTRIPTYLINE HCL 75 MG CAP: 75 | 30 days supply | Qty: 30 | Fill #0

## 2016-05-15 MED FILL — ROSUVASTATIN CALCIUM 40 MG: 40 | 30 days supply | Qty: 30 | Fill #2

## 2016-05-15 MED FILL — fentaNYL 37.5 MCG/HR PT72: 37.5 | 30 days supply | Qty: 10 | Fill #0

## 2016-05-15 MED FILL — METHOCARBAMOL 750 MG TABLET: 750 | 30 days supply | Qty: 90 | Fill #0

## 2016-05-19 LAB — CUP PACEART REMOTE DEVICE CHECK: Date Time Interrogation Session: 20170807213846

## 2016-05-21 ENCOUNTER — Other Ambulatory Visit: Payer: Self-pay

## 2016-05-21 MED ORDER — BUSPIRONE HCL 15 MG PO TABS: 15.0000 mg | ORAL_TABLET | Freq: Two times a day (BID) | ORAL | 1 refills | Status: DC

## 2016-05-21 MED FILL — busPIRone HCL 15 MG TABS: 15 | 30 days supply | Qty: 60 | Fill #0

## 2016-05-22 MED FILL — HYDROXYZINE PAM 25 MG CAP: 25 | 30 days supply | Qty: 30 | Fill #0

## 2016-05-26 ENCOUNTER — Ambulatory Visit (HOSPITAL_COMMUNITY): Payer: Self-pay | Admitting: Licensed Clinical Social Worker

## 2016-05-29 ENCOUNTER — Other Ambulatory Visit: Payer: Self-pay | Admitting: Medical

## 2016-05-29 MED FILL — OMEPRAZOLE DR 20 MG CAPSULE: 20 | 30 days supply | Qty: 30 | Fill #0

## 2016-05-30 MED FILL — traZODone HCL 100 MG TABS: 100 | 30 days supply | Qty: 30 | Fill #2

## 2016-06-10 ENCOUNTER — Ambulatory Visit: Payer: Medicare Other | Admitting: Nurse Practitioner

## 2016-06-10 MED FILL — ELIQUIS 5 MG TABLET: 5 | 90 days supply | Qty: 180 | Fill #2

## 2016-06-10 MED FILL — ROSUVASTATIN CALCIUM 40 MG: 40 | 30 days supply | Qty: 30 | Fill #3

## 2016-06-11 ENCOUNTER — Ambulatory Visit (INDEPENDENT_AMBULATORY_CARE_PROVIDER_SITE_OTHER): Payer: Medicare Other | Admitting: *Deleted

## 2016-06-11 ENCOUNTER — Encounter: Payer: Self-pay | Admitting: Nurse Practitioner

## 2016-06-11 DIAGNOSIS — I639 Cerebral infarction, unspecified: Secondary | ICD-10-CM

## 2016-06-11 MED FILL — GABAPENTIN 800 MG TABLET: 800 | 30 days supply | Qty: 120 | Fill #0

## 2016-06-12 DIAGNOSIS — M5412 Radiculopathy, cervical region: Secondary | ICD-10-CM | POA: Diagnosis not present

## 2016-06-12 DIAGNOSIS — M545 Low back pain: Secondary | ICD-10-CM | POA: Diagnosis not present

## 2016-06-12 DIAGNOSIS — M542 Cervicalgia: Secondary | ICD-10-CM | POA: Diagnosis not present

## 2016-06-12 DIAGNOSIS — M5416 Radiculopathy, lumbar region: Secondary | ICD-10-CM | POA: Diagnosis not present

## 2016-06-12 MED FILL — NORTRIPTYLINE HCL 50 MG CAP: 50 | 30 days supply | Qty: 60 | Fill #0

## 2016-06-12 NOTE — Progress Notes (Signed)
Carelink Summary Report / Loop Recorder 

## 2016-06-16 MED FILL — fentaNYL 37.5 MCG/HR PT72: 37.5 | 30 days supply | Qty: 10 | Fill #0

## 2016-06-17 MED FILL — SERTRALINE HCL 50 MG TABLET: 50 | 30 days supply | Qty: 60 | Fill #4

## 2016-06-17 MED FILL — METHOCARBAMOL 750 MG TABLET: 750 | 30 days supply | Qty: 90 | Fill #1

## 2016-06-19 ENCOUNTER — Encounter: Payer: Self-pay | Admitting: Nurse Practitioner

## 2016-06-25 ENCOUNTER — Other Ambulatory Visit: Payer: Self-pay | Admitting: Medical

## 2016-06-25 MED FILL — OMEPRAZOLE DR 20 MG CAPSULE: 20 | 30 days supply | Qty: 30 | Fill #1

## 2016-06-27 ENCOUNTER — Encounter (HOSPITAL_BASED_OUTPATIENT_CLINIC_OR_DEPARTMENT_OTHER): Payer: Self-pay | Admitting: Emergency Medicine

## 2016-06-27 ENCOUNTER — Emergency Department (HOSPITAL_BASED_OUTPATIENT_CLINIC_OR_DEPARTMENT_OTHER): Payer: Medicare Other

## 2016-06-27 ENCOUNTER — Ambulatory Visit (INDEPENDENT_AMBULATORY_CARE_PROVIDER_SITE_OTHER): Payer: Medicare Other | Admitting: Medical

## 2016-06-27 ENCOUNTER — Encounter: Payer: Self-pay | Admitting: Medical

## 2016-06-27 ENCOUNTER — Inpatient Hospital Stay (HOSPITAL_BASED_OUTPATIENT_CLINIC_OR_DEPARTMENT_OTHER)
Admission: EM | Admit: 2016-06-27 | Discharge: 2016-06-30 | DRG: 603 | Disposition: A | Discharge status: Home / Self Care | Payer: Medicare Other | Attending: Internal Medicine | Admitting: Internal Medicine

## 2016-06-27 VITALS — BP 140/72 | HR 80 | Temp 98.4°F | Ht 65.0 in | Wt 208.2 lb

## 2016-06-27 DIAGNOSIS — I10 Essential (primary) hypertension: Secondary | ICD-10-CM | POA: Diagnosis not present

## 2016-06-27 DIAGNOSIS — Z720 Tobacco use: Secondary | ICD-10-CM | POA: Diagnosis present

## 2016-06-27 DIAGNOSIS — Z8673 Personal history of transient ischemic attack (TIA), and cerebral infarction without residual deficits: Secondary | ICD-10-CM | POA: Diagnosis not present

## 2016-06-27 DIAGNOSIS — L03211 Cellulitis of face: Secondary | ICD-10-CM | POA: Diagnosis not present

## 2016-06-27 DIAGNOSIS — G609 Hereditary and idiopathic neuropathy, unspecified: Secondary | ICD-10-CM | POA: Diagnosis not present

## 2016-06-27 DIAGNOSIS — G894 Chronic pain syndrome: Secondary | ICD-10-CM

## 2016-06-27 DIAGNOSIS — R220 Localized swelling, mass and lump, head: Secondary | ICD-10-CM | POA: Diagnosis not present

## 2016-06-27 DIAGNOSIS — Z8249 Family history of ischemic heart disease and other diseases of the circulatory system: Secondary | ICD-10-CM

## 2016-06-27 DIAGNOSIS — Z23 Encounter for immunization: Secondary | ICD-10-CM

## 2016-06-27 DIAGNOSIS — E7800 Pure hypercholesterolemia, unspecified: Secondary | ICD-10-CM | POA: Diagnosis present

## 2016-06-27 DIAGNOSIS — L299 Pruritus, unspecified: Secondary | ICD-10-CM | POA: Diagnosis present

## 2016-06-27 DIAGNOSIS — Z87891 Personal history of nicotine dependence: Secondary | ICD-10-CM

## 2016-06-27 DIAGNOSIS — K219 Gastro-esophageal reflux disease without esophagitis: Secondary | ICD-10-CM | POA: Diagnosis not present

## 2016-06-27 DIAGNOSIS — I480 Paroxysmal atrial fibrillation: Secondary | ICD-10-CM | POA: Diagnosis not present

## 2016-06-27 DIAGNOSIS — Z800 Family history of malignant neoplasm of digestive organs: Secondary | ICD-10-CM

## 2016-06-27 DIAGNOSIS — F419 Anxiety disorder, unspecified: Secondary | ICD-10-CM | POA: Diagnosis not present

## 2016-06-27 DIAGNOSIS — H5710 Ocular pain, unspecified eye: Secondary | ICD-10-CM | POA: Diagnosis present

## 2016-06-27 DIAGNOSIS — I639 Cerebral infarction, unspecified: Secondary | ICD-10-CM

## 2016-06-27 DIAGNOSIS — I7300 Raynaud's syndrome without gangrene: Secondary | ICD-10-CM | POA: Diagnosis present

## 2016-06-27 DIAGNOSIS — Z79899 Other long term (current) drug therapy: Secondary | ICD-10-CM

## 2016-06-27 DIAGNOSIS — H538 Other visual disturbances: Secondary | ICD-10-CM | POA: Diagnosis present

## 2016-06-27 DIAGNOSIS — Z9109 Other allergy status, other than to drugs and biological substances: Secondary | ICD-10-CM

## 2016-06-27 DIAGNOSIS — F4310 Post-traumatic stress disorder, unspecified: Secondary | ICD-10-CM | POA: Diagnosis not present

## 2016-06-27 DIAGNOSIS — Z7901 Long term (current) use of anticoagulants: Secondary | ICD-10-CM

## 2016-06-27 DIAGNOSIS — G8929 Other chronic pain: Secondary | ICD-10-CM | POA: Diagnosis present

## 2016-06-27 DIAGNOSIS — Z79891 Long term (current) use of opiate analgesic: Secondary | ICD-10-CM

## 2016-06-27 DIAGNOSIS — R229 Localized swelling, mass and lump, unspecified: Secondary | ICD-10-CM | POA: Diagnosis not present

## 2016-06-27 DIAGNOSIS — Z7951 Long term (current) use of inhaled steroids: Secondary | ICD-10-CM

## 2016-06-27 LAB — BASIC METABOLIC PANEL
Anion gap: 7 (ref 5–15)
BUN: 6 mg/dL (ref 6–20)
CO2: 27 mmol/L (ref 22–32)
CREATININE: 0.8 mg/dL (ref 0.44–1.00)
Calcium: 8.6 mg/dL — ABNORMAL LOW (ref 8.9–10.3)
Chloride: 104 mmol/L (ref 101–111)
Glucose, Bld: 102 mg/dL — ABNORMAL HIGH (ref 65–99)
POTASSIUM: 3.7 mmol/L (ref 3.5–5.1)
SODIUM: 138 mmol/L (ref 135–145)

## 2016-06-27 LAB — CBC WITH DIFFERENTIAL/PLATELET
BASOS ABS: 0 10*3/uL (ref 0.0–0.1)
BASOS PCT: 1 %
EOS ABS: 0.2 10*3/uL (ref 0.0–0.7)
EOS PCT: 2 %
HCT: 38.5 % (ref 36.0–46.0)
HEMOGLOBIN: 12.8 g/dL (ref 12.0–15.0)
LYMPHS ABS: 1.7 10*3/uL (ref 0.7–4.0)
Lymphocytes Relative: 20 %
MCH: 30.4 pg (ref 26.0–34.0)
MCHC: 33.2 g/dL (ref 30.0–36.0)
MCV: 91.4 fL (ref 78.0–100.0)
Monocytes Absolute: 0.6 10*3/uL (ref 0.1–1.0)
Monocytes Relative: 7 %
NEUTROS PCT: 70 %
Neutro Abs: 5.7 10*3/uL (ref 1.7–7.7)
PLATELETS: 160 10*3/uL (ref 150–400)
RBC: 4.21 MIL/uL (ref 3.87–5.11)
RDW: 13.4 % (ref 11.5–15.5)
WBC: 8.2 10*3/uL (ref 4.0–10.5)

## 2016-06-27 MED ORDER — CLINDAMYCIN PHOSPHATE 600 MG/50ML IV SOLN
600.0000 mg | Freq: Once | INTRAVENOUS | Status: AC
  Administered 2016-06-27: 600 mg via INTRAVENOUS
  Filled 2016-06-27: qty 50

## 2016-06-27 MED ORDER — IOPAMIDOL (ISOVUE-300) INJECTION 61%
75.0000 mL | Freq: Once | INTRAVENOUS | Status: AC | PRN
  Administered 2016-06-27: 75 mL via INTRAVENOUS

## 2016-06-27 NOTE — ED Notes (Signed)
Pt. Reports history of stroke x 2 and A fib with medications.  Pt. Has no reports of weakness or chest pain.

## 2016-06-27 NOTE — Patient Instructions (Addendum)
With your rapid onset redness, swelling of rt side of face, palate pain, rt maxillary sinus pressure and swelling of rt nare, I do want you evaluated in the ED.  You may may have rapid worsening infection. Imaging may be beneficial, lab work and maybe IV antibiotics as we approach the weekend.  I have called downstairs to notify ED physician.(spoke with NP instead since MD was busy)  Follow up with us as needed/early next week.(as recommend by ED)  Notified and spoke with MD as well later when she called.

## 2016-06-27 NOTE — ED Provider Notes (Signed)
I assumed care of this patient from Dr. Pecola Leisureeese at 1500.  Please see their note for further details of Hx, PE.  Briefly patient is a 55 y.o. female with a Facial Swelling  that is concerning for cellulitis vs abscess.  Current plan is to follow-up on image and injured her currently.  CT with evidence of cellulitis and no evidence of abscess. Patient started on IV antibiotics. She will require admission for continued IV antibiotics and monitoring to ensure and infection improves and does not progress to periorbital region.   Appreciate hospitalist admission.   Disposition: Admit  Condition: Stable    Nira ConnPedro Eduardo Stacia Feazell, MD 06/28/16 0130

## 2016-06-27 NOTE — Plan of Care (Signed)
[facility]  [facility]ud[facility] 55 y/[facility] female with h/o A-fib on Eliquis who developed facial swelling yesterday which has progressed today. Per history obtained, it appears to have started around her nares and has extended to the right maxillary and right periorbital area. Head CT consistent with cellulitis of right face and nasal area with lymphadenopathy. She has been given Clindamycin in the ER and will be [facility] for [facility] to admit. A-fib is currently rate controlled.  Please call [phone] once the patient arrives to the medical floor.    Dawn CantorSaima Avraj Lindroth, MD

## 2016-06-27 NOTE — ED Notes (Signed)
Family at bedside. 

## 2016-06-27 NOTE — ED Notes (Signed)
Discontinue note typed in

## 2016-06-27 NOTE — ED Triage Notes (Signed)
Sent from PCP upstairs ref facial swelling which started yesterday. Reports some pain with the swelling.

## 2016-06-27 NOTE — ED Notes (Signed)
Report called to Publishing copyTom RN [facility]
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
## 2016-06-27 NOTE — Progress Notes (Signed)
Subjective:    Patient ID: Dawn Escobar, female    DOB: Jun 29, 1961, 55 y.o.   MRN: [id number]  HPI   Pt in with some rt side facial swelling. She woke up with this swelling on Thursday morning. She has some redness and swelling since then Pt rt nostril feels blocked as if not getting good airflow. Patient has new swelling down to her rt mandible area.   Pt has some fevers chills and sweats. This was happening on and off yesterday. States all day felt cold.   Pt has no upper teeth.   Yesterday gums upper mouth were tender but not today.  Pt denies lower teeth pain.  Pt reports trouble breathing but then clarifies rt nostril feels blocked.      Review of Systems  Constitutional: Positive for chills, diaphoresis, fatigue and fever.  Respiratory: Negative for cough, chest tightness, shortness of breath and wheezing.   Cardiovascular: Negative for chest pain and palpitations.  Musculoskeletal:       Rt side facial pain.   Skin:       No skin itching.  But redness extending rt rt lower eye lid.   Psychiatric/Behavioral: Negative for behavioral problems, confusion and decreased concentration.    Past Medical History:  Diagnosis Date  . Anxiety   . Anxiety   . Atrial fibrillation (HCC)   . GERD (gastroesophageal reflux disease) 04/25/2015  . Hereditary and idiopathic peripheral neuropathy 04/25/2015  . Hypercholesteremia   . Hypertension   . Migraine   . PTSD (post-traumatic stress disorder)   . Raynaud's disease   . Stroke (HCC)   . Stroke [facility])      Social History   Social History  . Marital status: Single    Spouse name: N/A  . Number of children: 0  . Years of education: 12th   Occupational History  . n/a    Social History Main Topics  . Smoking status: Former Smoker    Packs/day: 0.20    Types: Cigarettes  . Smokeless tobacco: Never Used  . Alcohol use 0.6 oz/week    1 Glasses of wine per week     Comment: occasionally  . Drug use:     Types:  Oxycodone     Comment: patient takes oxycodone for pain  . Sexual activity: No     Comment: pt. reports that she believes she's going through the change; have no sex drive; hot flashes and excessive sweating for a couple of months now   Other Topics Concern  . Not on file   Social History Narrative   Patient lives at home alone.   Caffeine use: 1 cup of coffee and 20 oz of soda    Past Surgical History:  Procedure Laterality Date  . ABDOMINAL HYSTERECTOMY    . HIP SURGERY  Right  . KNEE SURGERY    . 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 TEE WITHOUT CARDIOVERSION N/A 09/05/2014   Procedure: TRANSESOPHAGEAL ECHOCARDIOGRAM (TEE);  Surgeon: Wendall Stade, MD;  Location: [facility] ENDOSCOPY;  Service: Cardiovascular;  Laterality: N/A;  loop after  . WRIST SURGERY      Family History  Problem Relation Age of Onset  . Colon cancer Brother   . Heart disease Mother   . Heart attack Father     Allergies  Allergen Reactions  . Tape Rash    No tape at all    Current Outpatient Prescriptions  on File Prior to Visit  Medication Sig Dispense Refill  . acetaminophen (TYLENOL) 500 MG tablet Take 500 mg by mouth every 6 (six) hours as needed for mild pain, moderate pain or headache.    Marland Kitchen apixaban (ELIQUIS) 5 MG TABS tablet Take 1 tablet (5 mg total) by mouth 2 (two) times daily. 180 tablet 3  . busPIRone (BUSPAR) 15 MG tablet Take 1 tablet (15 mg total) by mouth 2 (two) times daily. 60 tablet 1  . clotrimazole-betamethasone (LOTRISONE) cream Apply 1 application topically 2 (two) times daily. 30 g 1  . fluticasone (FLONASE) 50 MCG/ACT nasal spray Place 2 sprays into both nostrils daily. 16 g 1  . gabapentin (NEURONTIN) 800 MG tablet Take 800 mg by mouth 4 (four) times daily.     . hydrOXYzine (VISTARIL) 25 MG capsule TAKE 1 CAPSULE BY MOUTH AT NIGHT TIME AS NEEDED 30 capsule 0  . methocarbamol (ROBAXIN) 500 MG tablet TAKE 1 TABLET (500 MG  TOTAL) BY MOUTH AT BEDTIME AS NEEDED FOR MUSCLE SPASMS (TAKE ONE DAILY BY MOUTH AT BEDTIME IF NEEDED). 30 tablet 0  . nortriptyline (PAMELOR) 25 MG capsule   0  . rosuvastatin (CRESTOR) 40 MG tablet TAKE 1 TABLET BY MOUTH DAILY 30 tablet 11  . sertraline (ZOLOFT) 50 MG tablet TAKE 2 TABLETS (100 MG TOTAL) BY MOUTH DAILY. 60 tablet 11  . tiZANidine (ZANAFLEX) 4 MG tablet   0  . traZODone (DESYREL) 100 MG tablet TAKE 1 TABLET (100 MG TOTAL) BY MOUTH AT BEDTIME. 30 tablet 2  . verapamil (CALAN-SR) 120 MG CR tablet Take 1 tablet (120 mg total) by mouth daily. 90 tablet 3  . omeprazole (PRILOSEC) 20 MG capsule TAKE 1 CAPSULE BY MOUTH DAILY 30 capsule 2   No current facility-administered medications on file prior to visit.     BP 140/72   Pulse 80   Temp 98.4 F (36.9 C) (Oral)   Ht 5\' 5"  (1.651 m)   Wt 208 lb 3.2 oz (94.4 kg)   SpO2 96%   BMI 34.65 kg/m       Objective:   Physical Exam   General  Mental Status - Alert. General Appearance - Well groomed. Not in acute distress.  Skin Rashes- No Rashes.  HEENT Head- Normal. Ear Auditory Canal - Left- Normal. Right - Normal.Tympanic Membrane- Left- Normal. Right- Normal. Eye Sclera/Conjunctiva- Left- Normal. Right- Normal. Nose & Sinuses Nasal Mucosa- Left-  Boggy and Congested. Right-  Boggy and  Congested.Bilateral maxillary and frontal sinus pressure.  Nostril-rt side- just adjacent to area one red and point tender area. Rt nostril appears thick. Mouth & Throat Lips: Upper Lip- Normal: no dryness, cracking, pallor, cyanosis, or vesicular eruption. Lower Lip-Normal: no dryness, cracking, pallor, cyanosis or vesicular eruption. Buccal Mucosa- Bilateral- No Aphthous ulcers. Oropharynx- No Discharge or Erythema. Rt side palate little tender to touch.  Tonsils: Characteristics- Bilateral- No Erythema or Congestion. Size/Enlargement- Bilateral- No enlargement. Discharge- bilateral-None.  Skin- of face redness and swelling under rt  eye lid. Swelling and redness over mandible.  Neck Neck- Supple. No Masses.   Chest and Lung Exam Auscultation: Breath Sounds:-Clear even and unlabored.  Cardiovascular Auscultation:Rythm- Regular, rate and rhythm. Murmurs & Other Heart Sounds:Ausculatation of the heart reveal- No Murmurs.  Lymphatic Head & Neck General Head & Neck Lymphatics: Bilateral: Description- No Localized lymphadenopathy.      Assessment & Plan:  With your rapid onset redness, swelling of rt side of face, palate pain, rt maxillary sinus  pressure and swelling of rt nare, I do want you evaluated in the ED.  You may may have rapid worsening infection. Imaging may be beneficial, lab work and maybe IV antibiotics as we approach the weekend.  I have called downstairs to notify ED physician.(spoke with NP instead since MD was busy)  Follow up with us as needed/early next week.(as recommend by ED)  Pt agreed to go down to ED. She did somewhat reluctantly but explained safest course of action in light of hx, exam finding and approaching weekend.   Olivine Hiers, Ramon DredgeEdward, PA-C

## 2016-06-27 NOTE — ED Provider Notes (Signed)
[facility] Provider Note   CSN: [id number] Arrival date & time: 06/27/16  1408     History   Chief Complaint Chief Complaint  Patient presents with  . Facial Swelling    HPI Cristela Stalder is a 55 y.o. female.  The history is provided by the patient. No language interpreter was used.    Richa Shor is a 55 y.o. female who presents to the Emergency Department complaining of facial swelling.  She developed swelling to her right face starting yesterday morning. Initially the swelling was around her right maxillary area and today has spread down to her lower face as well. She reports some local pain in that area as well as difficulty breathing through her nose and a little bit through her throat. She reports no fevers but feels hot and cold at times. No chest pain, nausea, vomiting, malaise.  Past Medical History:  Diagnosis Date  . Anxiety   . Anxiety   . Atrial fibrillation (HCC)   . GERD (gastroesophageal reflux disease) 04/25/2015  . Hereditary and idiopathic peripheral neuropathy 04/25/2015  . Hypercholesteremia   . Hypertension   . Migraine   . PTSD (post-traumatic stress disorder)   . Raynaud's disease   . Stroke (HCC)   . Stroke [facility])     Patient Active Problem List   Diagnosis Date Noted  . Generalized anxiety disorder 04/25/2015  . History of depression 04/25/2015  . Raynaud's disease 04/25/2015  . GERD (gastroesophageal reflux disease) 04/25/2015  . Neuropathy (HCC) 04/25/2015  . Neck pain 04/25/2015  . Atrial fibrillation (HCC) 10/14/2014  . Acute CVA (cerebrovascular accident) (HCC)   . Rash and nonspecific skin eruption   . Cerebral embolism with cerebral infarction (HCC)   . 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 Tobacco abuse   . TIA (transient ischemic attack) 08/30/2014  . Hypercholesterolemia 08/30/2014    Past Surgical History:  Procedure Laterality Date  . ABDOMINAL HYSTERECTOMY    . HIP  SURGERY  Right  . KNEE SURGERY    . 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 TEE WITHOUT CARDIOVERSION N/A 09/05/2014   Procedure: TRANSESOPHAGEAL ECHOCARDIOGRAM (TEE);  Surgeon: Wendall Stade, MD;  Location: [facility] ENDOSCOPY;  Service: Cardiovascular;  Laterality: N/A;  loop after  . WRIST SURGERY      OB History    No data available       Home Medications    Prior to Admission medications   Medication Sig Start Date End Date Taking? Authorizing Provider  acetaminophen (TYLENOL) 500 MG tablet Take 500 mg by mouth every 6 (six) hours as needed for mild pain, moderate pain or headache.    Historical Provider, MD  apixaban (ELIQUIS) 5 MG TABS tablet Take 1 tablet (5 mg total) by mouth 2 (two) times daily. 11/13/15   Amber Caryl Bis, NP  busPIRone (BUSPAR) 15 MG tablet Take 1 tablet (15 mg total) by mouth 2 (two) times daily. 05/21/16   Ramon Dredge Saguier, PA-C  clotrimazole-betamethasone (LOTRISONE) cream Apply 1 application topically 2 (two) times daily. 11/22/15   Ramon Dredge Saguier, PA-C  FentaNYL 37.5 MCG/HR PT72 Place onto the skin.    Historical Provider, MD  fluticasone (FLONASE) 50 MCG/ACT nasal spray Place 2 sprays into both nostrils daily. 07/18/15   Ramon Dredge Saguier, PA-C  gabapentin (NEURONTIN) 800 MG tablet Take 800 mg by mouth 4 (four) times daily.  Historical Provider, MD  hydrOXYzine (VISTARIL) 25 MG capsule TAKE 1 CAPSULE BY MOUTH AT NIGHT TIME AS NEEDED 05/22/16   Esperanza Richters, PA-C  methocarbamol (ROBAXIN) 500 MG tablet TAKE 1 TABLET (500 MG TOTAL) BY MOUTH AT BEDTIME AS NEEDED FOR MUSCLE SPASMS (TAKE ONE DAILY BY MOUTH AT BEDTIME IF NEEDED). 02/18/16   Ramon Dredge Saguier, PA-C  nortriptyline (PAMELOR) 25 MG capsule  02/27/16   Historical Provider, MD  omeprazole (PRILOSEC) 20 MG capsule TAKE 1 CAPSULE BY MOUTH DAILY 05/29/16   Esperanza Richters, PA-C  rosuvastatin (CRESTOR) 40 MG tablet TAKE 1 TABLET BY MOUTH DAILY 03/04/16   Micki Riley, MD  sertraline (ZOLOFT) 50 MG tablet TAKE 2 TABLETS (100 MG TOTAL) BY MOUTH DAILY. 02/14/16   Micki Riley, MD  tiZANidine (ZANAFLEX) 4 MG tablet  02/14/16   Historical Provider, MD  traZODone (DESYREL) 100 MG tablet TAKE 1 TABLET (100 MG TOTAL) BY MOUTH AT BEDTIME. 04/04/16   Esperanza Richters, PA-C  verapamil (CALAN-SR) 120 MG CR tablet Take 1 tablet (120 mg total) by mouth daily. 11/13/15   Marily Lente, NP    Family History Family History  Problem Relation Age of Onset  . Heart disease Mother   . Heart attack Father   . Colon cancer Brother     Social History Social History  Substance Use Topics  . Smoking status: Former Smoker    Packs/day: 0.20    Types: Cigarettes  . Smokeless tobacco: Never Used  . Alcohol use 0.6 oz/week    1 Glasses of wine per week     Comment: occasionally     Allergies   Tape   Review of Systems Review of Systems  All other systems reviewed and are negative.    Physical Exam Updated Vital Signs BP 127/69 (BP Location: Right Arm)   Pulse 78   Temp 98.6 F (37 C) (Oral)   Resp 16   Ht 5\' 5"  (1.651 m)   Wt 208 lb (94.3 kg)   SpO2 100%   BMI 34.61 kg/m   Physical Exam  Constitutional: She is oriented to person, place, and time. She appears well-developed and well-nourished.  HENT:  Head: Normocephalic and atraumatic.  Swelling with mild erythema to right maxilla, right lower face, right nasal ala.  No swelling to posterior OP.  Neck: Neck supple.  Cardiovascular: Normal rate and regular rhythm.   No murmur heard. Pulmonary/Chest: Effort normal and breath sounds normal. No stridor. No respiratory distress.  Abdominal: Soft. There is no tenderness. There is no rebound and no guarding.  Musculoskeletal: She exhibits no edema or tenderness.  Neurological: She is alert and oriented to person, place, and time.  Skin: Skin is warm and dry.  Psychiatric: She has a normal mood and affect. Her behavior is normal.  Nursing note and  vitals reviewed.    ED Treatments / Results  Labs (all labs ordered are listed, but only abnormal results are displayed) Labs Reviewed  BASIC METABOLIC PANEL  CBC WITH DIFFERENTIAL/PLATELET    EKG  EKG Interpretation  Date/Time:  Friday June 27 2016 14:44:14 EDT Ventricular Rate:  57 PR Interval:    QRS Duration: 98 QT Interval:  472 QTC Calculation: 460 R Axis:   71 Text Interpretation:  Sinus rhythm Low voltage, precordial leads Confirmed by Lincoln Brigham [phone]) on 06/27/2016 2:50:32 PM       Radiology No results found.  Procedures Procedures (including critical care time)  Medications Ordered in ED Medications -  No data to display   Initial Impression / Assessment and Plan / ED Course  I have reviewed the triage vital signs and the nursing notes.  Pertinent labs & imaging results that were available during my care of the patient were reviewed by me and considered in my medical decision making (see chart for details).  Clinical Course    Pt here with facial swelling, concern for abscess.  She is nontoxic on exam.  Plan to check CT face to further evaluate.  Pt care transferred pending further studies.     Final Clinical Impressions(s) / ED Diagnoses   Final diagnoses:  None    New Prescriptions New Prescriptions   No medications on file     Tilden FossaElizabeth Majed Pellegrin, MD 06/27/16 1515

## 2016-06-27 NOTE — Progress Notes (Signed)
Pre visit review using our clinic tool,if applicable. No additional management support is needed unless otherwise documented below in the visit note.  

## 2016-06-28 DIAGNOSIS — R229 Localized swelling, mass and lump, unspecified: Secondary | ICD-10-CM | POA: Diagnosis not present

## 2016-06-28 DIAGNOSIS — Z8673 Personal history of transient ischemic attack (TIA), and cerebral infarction without residual deficits: Secondary | ICD-10-CM | POA: Diagnosis not present

## 2016-06-28 DIAGNOSIS — K219 Gastro-esophageal reflux disease without esophagitis: Secondary | ICD-10-CM | POA: Diagnosis not present

## 2016-06-28 DIAGNOSIS — G8929 Other chronic pain: Secondary | ICD-10-CM | POA: Diagnosis present

## 2016-06-28 DIAGNOSIS — G609 Hereditary and idiopathic neuropathy, unspecified: Secondary | ICD-10-CM | POA: Diagnosis present

## 2016-06-28 DIAGNOSIS — I480 Paroxysmal atrial fibrillation: Secondary | ICD-10-CM | POA: Diagnosis present

## 2016-06-28 DIAGNOSIS — L299 Pruritus, unspecified: Secondary | ICD-10-CM | POA: Diagnosis present

## 2016-06-28 DIAGNOSIS — Z23 Encounter for immunization: Secondary | ICD-10-CM | POA: Diagnosis not present

## 2016-06-28 DIAGNOSIS — Z7951 Long term (current) use of inhaled steroids: Secondary | ICD-10-CM | POA: Diagnosis not present

## 2016-06-28 DIAGNOSIS — Z8249 Family history of ischemic heart disease and other diseases of the circulatory system: Secondary | ICD-10-CM | POA: Diagnosis not present

## 2016-06-28 DIAGNOSIS — L03211 Cellulitis of face: Secondary | ICD-10-CM | POA: Diagnosis not present

## 2016-06-28 DIAGNOSIS — I10 Essential (primary) hypertension: Secondary | ICD-10-CM | POA: Diagnosis present

## 2016-06-28 DIAGNOSIS — Z7901 Long term (current) use of anticoagulants: Secondary | ICD-10-CM | POA: Diagnosis not present

## 2016-06-28 DIAGNOSIS — Z87891 Personal history of nicotine dependence: Secondary | ICD-10-CM | POA: Diagnosis not present

## 2016-06-28 DIAGNOSIS — I7300 Raynaud's syndrome without gangrene: Secondary | ICD-10-CM | POA: Diagnosis present

## 2016-06-28 DIAGNOSIS — H538 Other visual disturbances: Secondary | ICD-10-CM | POA: Diagnosis present

## 2016-06-28 DIAGNOSIS — E7800 Pure hypercholesterolemia, unspecified: Secondary | ICD-10-CM | POA: Diagnosis present

## 2016-06-28 DIAGNOSIS — F419 Anxiety disorder, unspecified: Secondary | ICD-10-CM | POA: Diagnosis present

## 2016-06-28 DIAGNOSIS — Z9109 Other allergy status, other than to drugs and biological substances: Secondary | ICD-10-CM | POA: Diagnosis not present

## 2016-06-28 DIAGNOSIS — H5710 Ocular pain, unspecified eye: Secondary | ICD-10-CM | POA: Diagnosis present

## 2016-06-28 DIAGNOSIS — Z79899 Other long term (current) drug therapy: Secondary | ICD-10-CM | POA: Diagnosis not present

## 2016-06-28 DIAGNOSIS — Z79891 Long term (current) use of opiate analgesic: Secondary | ICD-10-CM | POA: Diagnosis not present

## 2016-06-28 DIAGNOSIS — F4310 Post-traumatic stress disorder, unspecified: Secondary | ICD-10-CM | POA: Diagnosis present

## 2016-06-28 DIAGNOSIS — Z800 Family history of malignant neoplasm of digestive organs: Secondary | ICD-10-CM | POA: Diagnosis not present

## 2016-06-28 LAB — COMPREHENSIVE METABOLIC PANEL
ALBUMIN: 3.8 g/dL (ref 3.5–5.0)
ALK PHOS: 68 U/L (ref 38–126)
ALT: 12 U/L — ABNORMAL LOW (ref 14–54)
ANION GAP: 6 (ref 5–15)
AST: 21 U/L (ref 15–41)
BILIRUBIN TOTAL: 0.5 mg/dL (ref 0.3–1.2)
BUN: 7 mg/dL (ref 6–20)
CALCIUM: 8.5 mg/dL — AB (ref 8.9–10.3)
CO2: 28 mmol/L (ref 22–32)
Chloride: 108 mmol/L (ref 101–111)
Creatinine, Ser: 0.79 mg/dL (ref 0.44–1.00)
GLUCOSE: 94 mg/dL (ref 65–99)
Potassium: 3.8 mmol/L (ref 3.5–5.1)
Sodium: 142 mmol/L (ref 135–145)
TOTAL PROTEIN: 6.6 g/dL (ref 6.5–8.1)

## 2016-06-28 LAB — CBC
HCT: 38.4 % (ref 36.0–46.0)
HEMOGLOBIN: 13 g/dL (ref 12.0–15.0)
MCH: 30 pg (ref 26.0–34.0)
MCHC: 33.9 g/dL (ref 30.0–36.0)
MCV: 88.5 fL (ref 78.0–100.0)
Platelets: 158 10*3/uL (ref 150–400)
RBC: 4.34 MIL/uL (ref 3.87–5.11)
RDW: 13.5 % (ref 11.5–15.5)
WBC: 4.8 10*3/uL (ref 4.0–10.5)

## 2016-06-28 LAB — TSH: TSH: 3.788 u[IU]/mL (ref 0.350–4.500)

## 2016-06-28 LAB — MAGNESIUM: MAGNESIUM: 2.2 mg/dL (ref 1.7–2.4)

## 2016-06-28 LAB — MRSA PCR SCREENING: MRSA by PCR: NEGATIVE

## 2016-06-28 LAB — PHOSPHORUS: Phosphorus: 4.6 mg/dL (ref 2.5–4.6)

## 2016-06-28 MED ORDER — SERTRALINE HCL 50 MG PO TABS
50.0000 mg | ORAL_TABLET | Freq: Every day | ORAL | Status: DC
  Administered 2016-06-28 – 2016-06-30 (×3): 50 mg via ORAL
  Filled 2016-06-28 (×3): qty 1

## 2016-06-28 MED ORDER — GABAPENTIN 400 MG PO CAPS
800.0000 mg | ORAL_CAPSULE | Freq: Four times a day (QID) | ORAL | Status: DC
  Administered 2016-06-28 – 2016-06-30 (×10): 800 mg via ORAL
  Filled 2016-06-28 (×11): qty 2

## 2016-06-28 MED ORDER — HYDROCODONE-ACETAMINOPHEN 5-325 MG PO TABS
1.0000 | ORAL_TABLET | ORAL | Status: DC | PRN
  Administered 2016-06-28: 1 via ORAL
  Administered 2016-06-29 – 2016-06-30 (×3): 2 via ORAL
  Filled 2016-06-28: qty 1
  Filled 2016-06-28 (×3): qty 2

## 2016-06-28 MED ORDER — ACETAMINOPHEN 325 MG PO TABS: 650.0000 mg | ORAL_TABLET | Freq: Four times a day (QID) | ORAL | Status: DC | PRN

## 2016-06-28 MED ORDER — CLINDAMYCIN PHOSPHATE 600 MG/50ML IV SOLN
600.0000 mg | Freq: Three times a day (TID) | INTRAVENOUS | Status: DC
  Administered 2016-06-28 – 2016-06-30 (×8): 600 mg via INTRAVENOUS
  Filled 2016-06-28 (×9): qty 50

## 2016-06-28 MED ORDER — TRAZODONE HCL 100 MG PO TABS
100.0000 mg | ORAL_TABLET | Freq: Every day | ORAL | Status: DC
  Administered 2016-06-28 – 2016-06-29 (×3): 100 mg via ORAL
  Filled 2016-06-28 (×3): qty 1

## 2016-06-28 MED ORDER — ACETAMINOPHEN 650 MG RE SUPP: 650.0000 mg | Freq: Four times a day (QID) | RECTAL | Status: DC | PRN

## 2016-06-28 MED ORDER — PANTOPRAZOLE SODIUM 40 MG PO TBEC
40.0000 mg | DELAYED_RELEASE_TABLET | Freq: Every day | ORAL | Status: DC
  Administered 2016-06-28 – 2016-06-30 (×3): 40 mg via ORAL
  Filled 2016-06-28 (×3): qty 1

## 2016-06-28 MED ORDER — FENTANYL 25 MCG/HR TD PT72
25.0000 ug | MEDICATED_PATCH | TRANSDERMAL | Status: DC
  Administered 2016-06-29: 25 ug via TRANSDERMAL
  Filled 2016-06-28: qty 1

## 2016-06-28 MED ORDER — FENTANYL 12 MCG/HR TD PT72
12.5000 ug | MEDICATED_PATCH | TRANSDERMAL | Status: DC
  Administered 2016-06-29: 12.5 ug via TRANSDERMAL
  Filled 2016-06-28: qty 1

## 2016-06-28 MED ORDER — BUSPIRONE HCL 5 MG PO TABS
15.0000 mg | ORAL_TABLET | Freq: Two times a day (BID) | ORAL | Status: DC
  Administered 2016-06-28 – 2016-06-30 (×6): 15 mg via ORAL
  Filled 2016-06-28 (×10): qty 1

## 2016-06-28 MED ORDER — NORTRIPTYLINE HCL 25 MG PO CAPS
75.0000 mg | ORAL_CAPSULE | Freq: Every evening | ORAL | Status: DC
  Administered 2016-06-28 – 2016-06-29 (×3): 75 mg via ORAL
  Filled 2016-06-28 (×4): qty 3

## 2016-06-28 MED ORDER — ONDANSETRON HCL 4 MG PO TABS: 4.0000 mg | ORAL_TABLET | Freq: Four times a day (QID) | ORAL | Status: DC | PRN

## 2016-06-28 MED ORDER — METHOCARBAMOL 500 MG PO TABS
750.0000 mg | ORAL_TABLET | Freq: Two times a day (BID) | ORAL | Status: DC
  Administered 2016-06-28 – 2016-06-30 (×6): 750 mg via ORAL
  Filled 2016-06-28 (×6): qty 2

## 2016-06-28 MED ORDER — ROSUVASTATIN CALCIUM 40 MG PO TABS
40.0000 mg | ORAL_TABLET | Freq: Every evening | ORAL | Status: DC
  Administered 2016-06-28 – 2016-06-29 (×2): 40 mg via ORAL
  Filled 2016-06-28 (×3): qty 1

## 2016-06-28 MED ORDER — APIXABAN 5 MG PO TABS
5.0000 mg | ORAL_TABLET | Freq: Two times a day (BID) | ORAL | Status: DC
  Administered 2016-06-28 – 2016-06-30 (×6): 5 mg via ORAL
  Filled 2016-06-28 (×6): qty 1

## 2016-06-28 MED ORDER — FENTANYL 37.5 MCG/HR TD PT72: 37.5000 ug | MEDICATED_PATCH | TRANSDERMAL | Status: DC

## 2016-06-28 MED ORDER — INFLUENZA VAC SPLIT QUAD 0.5 ML IM SUSY
0.5000 mL | PREFILLED_SYRINGE | INTRAMUSCULAR | Status: AC
  Administered 2016-06-30: 0.5 mL via INTRAMUSCULAR
  Filled 2016-06-28 (×2): qty 0.5

## 2016-06-28 MED ORDER — HYDROXYZINE HCL 25 MG PO TABS: 25.0000 mg | ORAL_TABLET | Freq: Three times a day (TID) | ORAL | Status: DC | PRN

## 2016-06-28 MED ORDER — ONDANSETRON HCL 4 MG/2ML IJ SOLN: 4.0000 mg | Freq: Four times a day (QID) | INTRAMUSCULAR | Status: DC | PRN

## 2016-06-28 MED ORDER — VERAPAMIL HCL ER 120 MG PO TBCR
120.0000 mg | EXTENDED_RELEASE_TABLET | Freq: Every day | ORAL | Status: DC
  Administered 2016-06-28 – 2016-06-30 (×3): 120 mg via ORAL
  Filled 2016-06-28 (×3): qty 1

## 2016-06-28 MED ORDER — HYDROXYZINE PAMOATE 25 MG PO CAPS: 25.0000 mg | ORAL_CAPSULE | Freq: Three times a day (TID) | ORAL | Status: DC | PRN

## 2016-06-28 NOTE — Progress Notes (Signed)
PROGRESS NOTE  Dawn [name] [id number] DOB: 1961-10-02 DOA: 06/27/2016 PCP: Esperanza [facility]tersSaguier, Edward, [facility]  Hospital Course/Subjective: 55 y.o. female with medical history significant of CVA, PTSD, anxiety, GERD, hypertension, atrial fibrillation admitted 9/22 with right facial cellulitis. She was admitted and started on IV clindamycin after CT Facial ruled out abscess, osteo or foreign body.   Today she notes improvement in swelling already, denies fevers or chills. Notes some warmth at times and sweating since this infection started.   Assessment/Plan: Facial Cellulitis  - continue Clindamycin - f/u cultures and MRSA screen  PAF - controlled, continue Eliquis GERD - protonix HTN - verapamil  DVT Prophylaxis: On Abixaban for PAF.  Code Status: FULL   Family Communication: None present.  Disposition Plan: Home when improved, in next 48 hours   Consultants:  None   Procedures:  Ct Facial 9/22   Antimicrobials:  IV Clindamycin 9/22 -->    Objective: Vitals:   06/27/16 1801 06/27/16 2000 06/27/16 2212 06/28/16 0604  BP: 138/58 129/72 (!) 149/71 120/60  Pulse: 65 (!) 59 64 66  Resp: 22 17 17 17   Temp: 98.1 F (36.7 C)  98.6 F (37 C) 98 F (36.7 C)  TempSrc: Oral  Oral Oral  SpO2: 97% 95% 91% 93%  Weight:      Height:        Intake/Output Summary (Last 24 hours) at 06/28/16 1206 Last data filed at 06/28/16 0900  Gross per 24 hour  Intake              290 ml  Output                0 ml  Net              290 ml   Filed Weights   06/27/16 1415  Weight: 94.3 kg (208 lb)     Exam: General:  Alert, oriented, calm, in no acute distress HEENT: Mild right facial swelling much improved, slightly slurred speech (chronic), minimal erythema Neck: supple, no masses, trachea mildline  Cardiovascular: RRR, no murmurs or rubs, no peripheral edema  Respiratory: clear to auscultation bilaterally, no wheezes, no crackles  Abdomen: soft, nontender, nondistended,  normal bowel tones heard  Skin: dry, no rashes  Musculoskeletal: no joint effusions, normal range of motion  Psychiatric: appropriate affect, normal speech  Neurologic: extraocular muscles intact, clear speech, moving all extremities with intact sensorium    Data Reviewed: CBC:  Recent Labs Lab 06/27/16 1510 06/28/16 0553  WBC 8.2 4.8  NEUTROABS 5.7  --   HGB 12.8 13.0  HCT 38.5 38.4  MCV 91.4 88.5  PLT 160 158   Basic Metabolic Panel:  Recent Labs Lab 06/27/16 1510 06/28/16 0553  NA 138 142  K 3.7 3.8  CL 104 108  CO2 27 28  GLUCOSE 102* 94  BUN 6 7  CREATININE 0.80 0.79  CALCIUM 8.6* 8.5*  MG  --  2.2  PHOS  --  4.6   GFR: Estimated Creatinine Clearance: 91.2 mL/min (by C-G formula based on SCr of 0.79 mg/dL). Liver Function Tests:  Recent Labs Lab 06/28/16 0553  AST 21  ALT 12*  ALKPHOS 68  BILITOT 0.5  PROT 6.6  ALBUMIN 3.8   No results for input(s): LIPASE, AMYLASE in the last 168 hours. No results for input(s): AMMONIA in the last 168 hours. Coagulation Profile: No results for input(s): INR, PROTIME in the last 168 hours. Cardiac Enzymes: No results for input(s): CKTOTAL, CKMB, CKMBINDEX,  TROPONINI in the last 168 hours. BNP (last 3 results) No results for input(s): PROBNP in the last 8760 hours. HbA1C: No results for input(s): HGBA1C in the last 72 hours. CBG: No results for input(s): GLUCAP in the last 168 hours. Lipid Profile: No results for input(s): CHOL, HDL, LDLCALC, TRIG, CHOLHDL, LDLDIRECT in the last 72 hours. Thyroid Function Tests:  Recent Labs  06/28/16 0553  TSH 3.788   Anemia Panel: No results for input(s): VITAMINB12, FOLATE, FERRITIN, TIBC, IRON, RETICCTPCT in the last 72 hours. Urine analysis:    Component Value Date/Time   COLORURINE YELLOW 06/01/2015 1636   APPEARANCEUR CLEAR 06/01/2015 1636   LABSPEC 1.001 (L) 06/01/2015 1636   PHURINE 6.5 06/01/2015 1636   GLUCOSEU NEGATIVE 06/01/2015 1636   HGBUR MODERATE  (A) 06/01/2015 1636   BILIRUBINUR NEGATIVE 06/01/2015 1636   KETONESUR NEGATIVE 06/01/2015 1636   PROTEINUR NEGATIVE 06/01/2015 1636   UROBILINOGEN 0.2 06/01/2015 1636   NITRITE NEGATIVE 06/01/2015 1636   LEUKOCYTESUR NEGATIVE 06/01/2015 1636   Sepsis Labs: @LABRCNTIP (procalcitonin:4,lacticidven:4)  ) Recent Results (from the past 240 hour(s))  MRSA PCR Screening     Status: None   Collection Time: 06/28/16 12:26 AM  Result Value Ref Range Status   MRSA by PCR NEGATIVE NEGATIVE Final    Comment:        The GeneXpert MRSA Assay (FDA approved for NASAL specimens only), is one component of a comprehensive MRSA colonization surveillance program. It is not intended to diagnose MRSA infection nor to guide or monitor treatment for MRSA infections.      Studies: Ct Maxillofacial W Contrast  Result Date: 06/27/2016 CLINICAL DATA:  RIGHT-sided facial swelling began yesterday. Painful to touch. EXAM: CT MAXILLOFACIAL WITH CONTRAST TECHNIQUE: Multidetector CT imaging of the maxillofacial structures was performed with intravenous contrast. Multiplanar CT image reconstructions were also generated. A small metallic BB was placed on the right temple in order to reliably differentiate right from left. CONTRAST:  75mL ISOVUE-300 IOPAMIDOL (ISOVUE-300) INJECTION 61% COMPARISON:  None. FINDINGS: Osseous: No mandibular or maxillary lucencies to suggest osteomyelitis. No visible facial fracture. Orbits: Negative.  BILATERAL cataract extraction. Sinuses: The frontal, ethmoid, sphenoid, and maxillary sinuses are essentially clear. Hypoplastic RIGHT maxillary sinus with slight mucosal thickening. Soft tissues: There is soft tissue swelling over the RIGHT face and nose, involving primarily the subcutaneous soft tissues. No nasal cavity masses. No findings to suggest parotid or submandibular gland inflammation. The RIGHT masseter muscle is slightly larger than the LEFT but there are no internal areas of  hypoattenuation. Hounsfield artifact related to dental appliances over the mandibular teeth, apparent braces could obscure dental pathology on the RIGHT contributing to facial swelling. Maxillary teeth are missing, likely previous extraction. RIGHT level 1 and level 2 asymmetric lymph node enlargement, likely reactive. No discrete fluid collections in the sublingual, submandibular, parapharyngeal, or masticator spaces. Limited intracranial: No abnormalities are seen. IMPRESSION: Asymmetric RIGHT facial and nasal soft tissue swelling, likely cellulitis, without obvious cause. No definite dental pathology is evident in this patient with only mandibular teeth. Reactive RIGHT level 1 and level 2 lymphadenopathy. No apparent salivary gland inflammation. Electronically Signed   By: Elsie Stain M.D.   On: 06/27/2016 16:26    Scheduled Meds: . apixaban  5 mg Oral BID  . busPIRone  15 mg Oral BID  . clindamycin (CLEOCIN) IV  600 mg Intravenous Q8H  . [START ON 06/29/2016] fentaNYL  25 mcg Transdermal Q72H   And  . [START ON 06/29/2016] fentaNYL  12.5 mcg Transdermal Q72H  . gabapentin  800 mg Oral QID  . [START ON 06/29/2016] Influenza vac split quadrivalent PF  0.5 mL Intramuscular Tomorrow-1000  . methocarbamol  750 mg Oral BID  . nortriptyline  75 mg Oral QPM  . pantoprazole  40 mg Oral Daily  . rosuvastatin  40 mg Oral QPM  . sertraline  50 mg Oral Daily  . traZODone  100 mg Oral QHS  . verapamil  120 mg Oral Daily    Continuous Infusions:    LOS: 0 days   Time spent: 21 minutes  Diandra Cimini Vergie Living, MD [facility] Pager [phone]  If 7PM-7AM, please contact night-coverage [email or web address] Password [facility] 06/28/2016, 12:06 PM

## 2016-06-29 DIAGNOSIS — L03211 Cellulitis of face: Principal | ICD-10-CM

## 2016-06-29 LAB — HEMOGLOBIN A1C
HEMOGLOBIN A1C: 5.9 % — AB (ref 4.8–5.6)
MEAN PLASMA GLUCOSE: 123 mg/dL

## 2016-06-29 NOTE — Progress Notes (Signed)
PROGRESS NOTE  Dawn [name] [id number] DOB: 06/02/1961 DOA: 06/27/2016 PCP: Esperanza RichtersSaguier, Edward, [facility]  Hospital Course/Subjective: 55 y.o. female with medical history significant of CVA, PTSD, anxiety, GERD, hypertension, atrial fibrillation admitted 9/22 with right facial cellulitis. She was admitted and started on IV clindamycin after CT Facial ruled out abscess, osteo or foreign body.   Today she is worried because she has some new itching in right eye and says her vision is not normal in that eye. She denies any new neurological symptoms, says there is a little pain in the right eye as well depending on how she moves it. No fevers or chills.  Assessment/Plan: Facial Cellulitis  - continue IV Clindamycin one more day - f/u cultures and MRSA screen - likely home in AM with PO Clindamycin  Blurry vision/eye pain - she does have a history of CVA - given improvement of her infection, no indication of worsening infection I don't think further imaging is warranted  PAF - controlled, continue Eliquis GERD - protonix HTN - verapamil  DVT Prophylaxis: On Abixaban for PAF.  Code Status: FULL   Family Communication: None present.  Disposition Plan: Home when improved, likely in AM.  Consultants:  None   Procedures:  Ct Facial 9/22   Antimicrobials:  IV Clindamycin 9/22 -->    Objective: Vitals:   06/28/16 0604 06/28/16 1449 06/28/16 2029 06/29/16 0620  BP: 120/60 123/60 127/61 (!) 106/57  Pulse: 66 62 65 60  Resp: 17 18 16 16   Temp: 98 F (36.7 C) 98.7 F (37.1 C) 98.5 F (36.9 C) 98.3 F (36.8 C)  TempSrc: Oral Oral Oral Oral  SpO2: 93% 97% 100% 94%  Weight:      Height:        Intake/Output Summary (Last 24 hours) at 06/29/16 1254 Last data filed at 06/29/16 0900  Gross per 24 hour  Intake              720 ml  Output                0 ml  Net              720 ml   Filed Weights   06/27/16 1415  Weight: 94.3 kg (208 lb)     Exam: General:  Alert,  oriented, calm, in no acute distress HEENT: Minimal erythema, almost no swelling appreciable. R eye is barely injected, moving eye normally without apparent discomfort Neck: supple, no masses, trachea mildline  Cardiovascular: RRR, no murmurs or rubs, no peripheral edema  Respiratory: clear to auscultation bilaterally, no wheezes, no crackles  Abdomen: soft, nontender, nondistended, normal bowel tones heard  Skin: dry, no rashes  Musculoskeletal: no joint effusions, normal range of motion  Psychiatric: appropriate affect, normal speech  Neurologic: extraocular muscles intact, clear speech, moving all extremities with intact sensorium    Data Reviewed: CBC:  Recent Labs Lab 06/27/16 1510 06/28/16 0553  WBC 8.2 4.8  NEUTROABS 5.7  --   HGB 12.8 13.0  HCT 38.5 38.4  MCV 91.4 88.5  PLT 160 158   Basic Metabolic Panel:  Recent Labs Lab 06/27/16 1510 06/28/16 0553  NA 138 142  K 3.7 3.8  CL 104 108  CO2 27 28  GLUCOSE 102* 94  BUN 6 7  CREATININE 0.80 0.79  CALCIUM 8.6* 8.5*  MG  --  2.2  PHOS  --  4.6   GFR: Estimated Creatinine Clearance: 91.2 mL/min (by C-G formula based on SCr  of 0.79 mg/dL). Liver Function Tests:  Recent Labs Lab 06/28/16 0553  AST 21  ALT 12*  ALKPHOS 68  BILITOT 0.5  PROT 6.6  ALBUMIN 3.8   No results for input(s): LIPASE, AMYLASE in the last 168 hours. No results for input(s): AMMONIA in the last 168 hours. Coagulation Profile: No results for input(s): INR, PROTIME in the last 168 hours. Cardiac Enzymes: No results for input(s): CKTOTAL, CKMB, CKMBINDEX, TROPONINI in the last 168 hours. BNP (last 3 results) No results for input(s): PROBNP in the last 8760 hours. HbA1C:  Recent Labs  06/28/16 0553  HGBA1C 5.9*   CBG: No results for input(s): GLUCAP in the last 168 hours. Lipid Profile: No results for input(s): CHOL, HDL, LDLCALC, TRIG, CHOLHDL, LDLDIRECT in the last 72 hours. Thyroid Function Tests:  Recent Labs   06/28/16 0553  TSH 3.788   Anemia Panel: No results for input(s): VITAMINB12, FOLATE, FERRITIN, TIBC, IRON, RETICCTPCT in the last 72 hours. Urine analysis:    Component Value Date/Time   COLORURINE YELLOW 06/01/2015 1636   APPEARANCEUR CLEAR 06/01/2015 1636   LABSPEC 1.001 (L) 06/01/2015 1636   PHURINE 6.5 06/01/2015 1636   GLUCOSEU NEGATIVE 06/01/2015 1636   HGBUR MODERATE (A) 06/01/2015 1636   BILIRUBINUR NEGATIVE 06/01/2015 1636   KETONESUR NEGATIVE 06/01/2015 1636   PROTEINUR NEGATIVE 06/01/2015 1636   UROBILINOGEN 0.2 06/01/2015 1636   NITRITE NEGATIVE 06/01/2015 1636   LEUKOCYTESUR NEGATIVE 06/01/2015 1636   Sepsis Labs: @LABRCNTIP (procalcitonin:4,lacticidven:4)  ) Recent Results (from the past 240 hour(s))  MRSA PCR Screening     Status: None   Collection Time: 06/28/16 12:26 AM  Result Value Ref Range Status   MRSA by PCR NEGATIVE NEGATIVE Final    Comment:        The GeneXpert MRSA Assay (FDA approved for NASAL specimens only), is one component of a comprehensive MRSA colonization surveillance program. It is not intended to diagnose MRSA infection nor to guide or monitor treatment for MRSA infections.      Studies: No results found.  Scheduled Meds: . apixaban  5 mg Oral BID  . busPIRone  15 mg Oral BID  . clindamycin (CLEOCIN) IV  600 mg Intravenous Q8H  . fentaNYL  25 mcg Transdermal Q72H   And  . fentaNYL  12.5 mcg Transdermal Q72H  . gabapentin  800 mg Oral QID  . Influenza vac split quadrivalent PF  0.5 mL Intramuscular Tomorrow-1000  . methocarbamol  750 mg Oral BID  . nortriptyline  75 mg Oral QPM  . pantoprazole  40 mg Oral Daily  . rosuvastatin  40 mg Oral QPM  . sertraline  50 mg Oral Daily  . traZODone  100 mg Oral QHS  . verapamil  120 mg Oral Daily    Continuous Infusions:    LOS: 1 day   Time spent: 22 minutes  Mir Vergie Living, MD [facility] Pager [phone]  If 7PM-7AM, please contact  night-coverage [email or web address] Password [facility] 06/29/2016, 12:54 PM

## 2016-06-30 MED ORDER — CLINDAMYCIN HCL 300 MG PO CAPS: 300.0000 mg | ORAL_CAPSULE | Freq: Four times a day (QID) | ORAL | 0 refills | Status: AC

## 2016-06-30 MED ORDER — DIPHENHYDRAMINE HCL 25 MG PO CAPS
25.0000 mg | ORAL_CAPSULE | ORAL | Status: DC | PRN
  Administered 2016-06-30: 25 mg via ORAL
  Filled 2016-06-30: qty 1

## 2016-06-30 MED ORDER — HYDROCODONE-ACETAMINOPHEN 5-325 MG PO TABS: 1.0000 | ORAL_TABLET | ORAL | 0 refills | Status: DC | PRN

## 2016-06-30 MED ORDER — DIPHENHYDRAMINE HCL 25 MG PO CAPS: 25.0000 mg | ORAL_CAPSULE | ORAL | 0 refills | Status: DC | PRN

## 2016-06-30 MED FILL — BANOPHEN 25 MG CAPSULE: 25 | 100 days supply | Qty: 100 | Fill #0

## 2016-06-30 MED FILL — CLINDAMYCIN HCL 300 MG CAPS: 300 | 7 days supply | Qty: 28 | Fill #0

## 2016-06-30 MED FILL — HYDROCODON-APAP 5-325: 5-325 | 2 days supply | Qty: 10 | Fill #0

## 2016-06-30 NOTE — Discharge Summary (Signed)
Discharge Summary  Dawn Escobar NLG:[id number] DOB: [birth year]  PCP: Esperanza Richters, PA-C  Admit date: 06/27/2016 Discharge date: 06/30/2016   Recommendations for Outpatient Follow-up:  1. PCP 1-2 weeks   Discharge Diagnoses:  Active Hospital Problems   Diagnosis Date Noted  . Paroxysmal atrial fibrillation (HCC) 06/28/2016  . History of CVA (cerebrovascular accident) 06/28/2016  . Essential hypertension 06/28/2016  . Facial cellulitis 06/27/2016  . GERD (gastroesophageal reflux disease) 04/25/2015  . Tobacco abuse     Resolved Hospital Problems   Diagnosis Date Noted Date Resolved  No resolved problems to display.    Discharge Condition: Stable   Diet recommendation: Regular   Vitals:   06/29/16 2037 06/30/16 0523  BP: (!) 143/64 (!) 103/55  Pulse: 73 65  Resp: 17 17  Temp: 98.7 F (37.1 C) 98.7 F (37.1 C)   History of present illness:  55 y.o.femalewith medical history significant of CVA, PTSD, anxiety, GERD, hypertension, atrial fibrillation admitted 9/22 with right facial cellulitis. She was admitted and started on IV clindamycin after CT Facial ruled out abscess, osteo or foreign body.   Her swelling improved, she has been afebrile and labs were stable on last check. Tolerating diet, but having some acute on chronic mouth and neck pain. Agreeable to d/c home today, will complete total 10 day course at home. Will f/u with PCP within one week and f/u with pain clinic as well.   Assessment/Plan: Facial Cellulitis  - PO Clindamycin until 10/2 - improved on IV clinda - CT on admit without abscess or dental cause  Blurry vision/eye pain - she does have a history of CVA - given improvement of her infection, no indication of worsening infection I don't think further imaging is warranted  PAF - controlled, continue Eliquis GERD - protonix HTN - verapamil  Procedures:  CT facial   Consultations:  None   Discharge Exam: BP (!) 103/55 (BP  Location: Right Arm)   Pulse 65   Temp 98.7 F (37.1 C) (Oral)   Resp 17   Ht 5\' 5"  (1.651 m)   Wt 94.3 kg (208 lb)   SpO2 93%   BMI 34.61 kg/m  General:  Alert, oriented, calm, in no acute distress  HEENT: minimal swelling on right side, nontender, no fluctance, minimal erythema, EOMI, moving eyes without pain, vision intact Cardiovascular: RRR, no murmurs or rubs, no peripheral edema  Respiratory: clear to auscultation bilaterally, no wheezes, no crackles  Abdomen: soft, nontender, nondistended, normal bowel tones heard  Skin: dry, no rashes  Musculoskeletal: no joint effusions, normal range of motion  Psychiatric: appropriate affect, normal speech  Neurologic: extraocular muscles intact, clear speech, moving all extremities with intact sensorium    Discharge Instructions You were cared for by a hospitalist during your hospital stay. If you have any questions about your discharge medications or the care you received while you were in the hospital after you are discharged, you can call the unit and asked to speak with the hospitalist on call if the hospitalist that took care of you is not available. Once you are discharged, your primary care physician will handle any further medical issues. Please note that NO REFILLS for any discharge medications will be authorized once you are discharged, as it is imperative that you return to your primary care physician (or establish a relationship with a primary care physician if you do not have one) for your aftercare needs so that they can reassess your need for medications and monitor your lab  values.  Discharge Instructions    Call MD for:  severe uncontrolled pain    Complete by:  As directed    Call MD for:  temperature >100.4    Complete by:  As directed    Diet - low sodium heart healthy    Complete by:  As directed    Increase activity slowly    Complete by:  As directed        Medication List    TAKE these medications     acetaminophen 500 MG tablet Commonly known as:  TYLENOL Take 500 mg by mouth every 6 (six) hours as needed for mild pain, moderate pain or headache.   apixaban 5 MG Tabs tablet Commonly known as:  ELIQUIS Take 1 tablet (5 mg total) by mouth 2 (two) times daily.   busPIRone 15 MG tablet Commonly known as:  BUSPAR Take 1 tablet (15 mg total) by mouth 2 (two) times daily.   clindamycin 300 MG capsule Commonly known as:  CLEOCIN Take 1 capsule (300 mg total) by mouth 4 (four) times daily.   clotrimazole-betamethasone cream Commonly known as:  LOTRISONE Apply 1 application topically 2 (two) times daily.   diphenhydrAMINE 25 mg capsule Commonly known as:  BENADRYL Take 1 capsule (25 mg total) by mouth every 4 (four) hours as needed for itching.   FentaNYL 37.5 MCG/HR Pt72 Place 37.5 mcg onto the skin every 3 (three) days.   fluticasone 50 MCG/ACT nasal spray Commonly known as:  FLONASE Place 2 sprays into both nostrils daily.   gabapentin 800 MG tablet Commonly known as:  NEURONTIN Take 800 mg by mouth 4 (four) times daily.   HYDROcodone-acetaminophen 5-325 MG tablet Commonly known as:  NORCO/VICODIN Take 1-2 tablets by mouth every 4 (four) hours as needed for moderate pain.   hydrOXYzine 25 MG capsule Commonly known as:  VISTARIL TAKE 1 CAPSULE BY MOUTH AT NIGHT TIME AS NEEDED What changed:  See the new instructions.   methocarbamol 750 MG tablet Commonly known as:  ROBAXIN Take 750 mg by mouth daily as needed for muscle spasms. What changed:  Another medication with the same name was removed. Continue taking this medication, and follow the directions you see here.   methocarbamol 750 MG tablet Commonly known as:  ROBAXIN Take 750 mg by mouth 2 (two) times daily. What changed:  Another medication with the same name was removed. Continue taking this medication, and follow the directions you see here.   nortriptyline 75 MG capsule Commonly known as:  PAMELOR Take 75  mg by mouth every evening.   omeprazole 20 MG capsule Commonly known as:  PRILOSEC TAKE 1 CAPSULE BY MOUTH DAILY What changed:  See the new instructions.   rosuvastatin 40 MG tablet Commonly known as:  CRESTOR TAKE 1 TABLET BY MOUTH DAILY What changed:  See the new instructions.   sertraline 50 MG tablet Commonly known as:  ZOLOFT TAKE 2 TABLETS (100 MG TOTAL) BY MOUTH DAILY. What changed:  See the new instructions.   tiZANidine 4 MG tablet Commonly known as:  ZANAFLEX Take 4 mg by mouth 2 (two) times daily.   traZODone 100 MG tablet Commonly known as:  DESYREL TAKE 1 TABLET (100 MG TOTAL) BY MOUTH AT BEDTIME.   verapamil 120 MG CR tablet Commonly known as:  CALAN-SR Take 1 tablet (120 mg total) by mouth daily.      Allergies  Allergen Reactions  . Other     Hospital sheet-hives?  Marland Kitchen  Tape Rash    No tape at all      The results of significant diagnostics from this hospitalization (including imaging, microbiology, ancillary and laboratory) are listed below for reference.    Significant Diagnostic Studies: Ct Maxillofacial W Contrast  Result Date: 06/27/2016 CLINICAL DATA:  RIGHT-sided facial swelling began yesterday. Painful to touch. EXAM: CT MAXILLOFACIAL WITH CONTRAST TECHNIQUE: Multidetector CT imaging of the maxillofacial structures was performed with intravenous contrast. Multiplanar CT image reconstructions were also generated. A small metallic BB was placed on the right temple in order to reliably differentiate right from left. CONTRAST:  75mL ISOVUE-300 IOPAMIDOL (ISOVUE-300) INJECTION 61% COMPARISON:  None. FINDINGS: Osseous: No mandibular or maxillary lucencies to suggest osteomyelitis. No visible facial fracture. Orbits: Negative.  BILATERAL cataract extraction. Sinuses: The frontal, ethmoid, sphenoid, and maxillary sinuses are essentially clear. Hypoplastic RIGHT maxillary sinus with slight mucosal thickening. Soft tissues: There is soft tissue swelling over  the RIGHT face and nose, involving primarily the subcutaneous soft tissues. No nasal cavity masses. No findings to suggest parotid or submandibular gland inflammation. The RIGHT masseter muscle is slightly larger than the LEFT but there are no internal areas of hypoattenuation. Hounsfield artifact related to dental appliances over the mandibular teeth, apparent braces could obscure dental pathology on the RIGHT contributing to facial swelling. Maxillary teeth are missing, likely previous extraction. RIGHT level 1 and level 2 asymmetric lymph node enlargement, likely reactive. No discrete fluid collections in the sublingual, submandibular, parapharyngeal, or masticator spaces. Limited intracranial: No abnormalities are seen. IMPRESSION: Asymmetric RIGHT facial and nasal soft tissue swelling, likely cellulitis, without obvious cause. No definite dental pathology is evident in this patient with only mandibular teeth. Reactive RIGHT level 1 and level 2 lymphadenopathy. No apparent salivary gland inflammation. Electronically Signed   By: Elsie StainJohn T Curnes M.D.   On: 06/27/2016 16:26    Microbiology: Recent Results (from the past 240 hour(s))  MRSA PCR Screening     Status: None   Collection Time: 06/28/16 12:26 AM  Result Value Ref Range Status   MRSA by PCR NEGATIVE NEGATIVE Final    Comment:        The GeneXpert MRSA Assay (FDA approved for NASAL specimens only), is one component of a comprehensive MRSA colonization surveillance program. It is not intended to diagnose MRSA infection nor to guide or monitor treatment for MRSA infections.      Labs: Basic Metabolic Panel:  Recent Labs Lab 06/27/16 1510 06/28/16 0553  NA 138 142  K 3.7 3.8  CL 104 108  CO2 27 28  GLUCOSE 102* 94  BUN 6 7  CREATININE 0.80 0.79  CALCIUM 8.6* 8.5*  MG  --  2.2  PHOS  --  4.6   Liver Function Tests:  Recent Labs Lab 06/28/16 0553  AST 21  ALT 12*  ALKPHOS 68  BILITOT 0.5  PROT 6.6  ALBUMIN 3.8    No results for input(s): LIPASE, AMYLASE in the last 168 hours. No results for input(s): AMMONIA in the last 168 hours. CBC:  Recent Labs Lab 06/27/16 1510 06/28/16 0553  WBC 8.2 4.8  NEUTROABS 5.7  --   HGB 12.8 13.0  HCT 38.5 38.4  MCV 91.4 88.5  PLT 160 158   Cardiac Enzymes: No results for input(s): CKTOTAL, CKMB, CKMBINDEX, TROPONINI in the last 168 hours. BNP: BNP (last 3 results) No results for input(s): BNP in the last 8760 hours.  ProBNP (last 3 results) No results for input(s): PROBNP in the last  8760 hours.  CBG: No results for input(s): GLUCAP in the last 168 hours.  Time spent: 32 minutes were spent in preparing this discharge including medication reconciliation, counseling, and coordination of care.  Signed:  Lamyah Creed [employer]  [facility] 06/30/2016, 10:32 AM

## 2016-06-30 NOTE — Progress Notes (Signed)
Patient given discharge instructions, and verbalized an understanding of all discharge instructions.  Patient agrees with discharge plan, and is being discharged in stable medical condition.  Patient given transportation via wheelchair. 

## 2016-07-01 ENCOUNTER — Telehealth: Payer: Self-pay | Admitting: Behavioral Health

## 2016-07-01 NOTE — Telephone Encounter (Signed)
Transition Care Management Follow-up Telephone Call  PCP: Marisue BrooklynSaguier, Edward, PA-C  Admit date: 06/27/2016 Discharge date: 06/30/2016   Recommendations for Outpatient Follow-up:  1. PCP 1-2 weeks     How have you been since you were released from the hospital? Patient stated, "I'm not feeling that great because I have an awful taste still in my mouth". "Also, my mouth is really sore and I can't really eat anything".   Do you understand why you were in the hospital? yes   Do you understand the discharge instructions? yes   Where were you discharged to? Home   Items Reviewed:  Medications reviewed: yes  Allergies reviewed: yes  Dietary changes reviewed: yes, regular diet.  Referrals reviewed: yes, follow-up with PCP in 1-2 weeks.   Functional Questionnaire:   Activities of Daily Living (ADLs):   She states they are independent in the following: ambulation, bathing and hygiene, feeding, continence, grooming, toileting and dressing States they require assistance with the following: None   Any transportation issues/concerns?: no   Any patient concerns? no   Confirmed importance and date/time of follow-up visits scheduled yes, 07/07/16 at 1:30 PM.  Provider Appointment booked with Esperanza RichtersEdward Saguier, PA-C.  Confirmed with patient if condition begins to worsen call PCP or go to the ER.  Patient was given the office number and encouraged to call back with question or concerns.  : yes

## 2016-07-01 NOTE — Telephone Encounter (Signed)
Attempted to reach patient for TCM/Hospital Follow-up call. Left message for patient to return call when available.    

## 2016-07-07 ENCOUNTER — Other Ambulatory Visit: Payer: Self-pay | Admitting: Medical

## 2016-07-07 ENCOUNTER — Encounter: Payer: Self-pay | Admitting: Medical

## 2016-07-07 ENCOUNTER — Ambulatory Visit (INDEPENDENT_AMBULATORY_CARE_PROVIDER_SITE_OTHER): Payer: Medicare Other | Admitting: Medical

## 2016-07-07 VITALS — HR 85 | Temp 98.3°F | Ht 65.0 in | Wt 207.8 lb

## 2016-07-07 DIAGNOSIS — Z09 Encounter for follow-up examination after completed treatment for conditions other than malignant neoplasm: Secondary | ICD-10-CM | POA: Diagnosis not present

## 2016-07-07 DIAGNOSIS — B370 Candidal stomatitis: Secondary | ICD-10-CM | POA: Diagnosis not present

## 2016-07-07 DIAGNOSIS — L03211 Cellulitis of face: Secondary | ICD-10-CM

## 2016-07-07 DIAGNOSIS — K140 Glossitis: Secondary | ICD-10-CM

## 2016-07-07 MED ORDER — FLUCONAZOLE 150 MG PO TABS: ORAL_TABLET | ORAL | 0 refills | Status: DC

## 2016-07-07 MED ORDER — MAGIC MOUTHWASH
ORAL | 0 refills | Status: DC
Start: 2016-07-07 — End: 2016-07-18

## 2016-07-07 MED FILL — busPIRone HCL 15 MG TABS: 15 | 30 days supply | Qty: 60 | Fill #1

## 2016-07-07 MED FILL — HYDROXYZINE PAM 25 MG CAP: 25 | 30 days supply | Qty: 30 | Fill #0

## 2016-07-07 MED FILL — FLUCONAZOLE 150 MG TABLET: 150 | 3 days supply | Qty: 3 | Fill #0

## 2016-07-07 MED FILL — tiZANidine HCL 4 MG TABS: 4 | 20 days supply | Qty: 90 | Fill #0

## 2016-07-07 MED FILL — MAGIC MW LID/MAAL/DP1:1:1: 2 | 10 days supply | Qty: 200 | Fill #0

## 2016-07-07 NOTE — Patient Instructions (Addendum)
  Your face cellulitis does appear resolved now. Finish last climdamycin tab today.  You appear to have likely moderate to severe  thrush following antibiotic use.  Start diflucan tomorrow. 1 tab a day for 3 days.  Follow up this Friday or as needed. If not improved may need to do work up and may refer to ENT.

## 2016-07-07 NOTE — Progress Notes (Signed)
Pre visit review using our clinic tool,if applicable. No additional management support is needed unless otherwise documented below in the visit note.  

## 2016-07-07 NOTE — Progress Notes (Signed)
Subjective:    Patient ID: Dawn Escobar, female    DOB: 1961-02-15, 55 y.o.   MRN: [id number]  HPI   Pt in for follow up on her facial cellulitis.   Pt was admitted to hospital after ED evaluation. Pt was given clindamycin iv. Then after discharge was placed on oral clindamycin. She has 1 tab of antibiotic left and will finish antibiotic today.  About 9 days ago hurt mouth started to hurt on swallowing and when she has soreness of her throat.(this occurred while on antibiotic clindamycin.  Pt tongue feels thick and dry. Pt gums feel tender.   Pt on Sunday had whitish film on her tongue.   Reviewed discharge summary and ED admission notes. Pt had rt side soft tissue swelling when I saw her. CT done in the ED and confirmed soft tissue swelling without complicatins or cause. She was admitted to hosptial for IV antibiotics. Then discharge on 06-30-2016. Pt did well while in hospital. Her MRSA by PCR study came back negative. Her cbc 9 days ago did not show wbc elevation.       Review of Systems  Constitutional: Negative for chills, fatigue and fever.  Respiratory: Negative for cough, choking, chest tightness and shortness of breath.   Cardiovascular: Negative for chest pain and palpitations.  Gastrointestinal: Negative for abdominal pain.  Musculoskeletal: Negative for back pain.  Skin: Negative for rash.       Mouth- mucosa appears red and inflammed.  Neurological: Negative for dizziness, seizures, weakness, numbness and headaches.  Hematological: Negative for adenopathy. Does not bruise/bleed easily.  Psychiatric/Behavioral: Negative for behavioral problems and confusion.    Past Medical History:  Diagnosis Date  . Anxiety   . Anxiety   . Atrial fibrillation (HCC)   . GERD (gastroesophageal reflux disease) 04/25/2015  . Hereditary and idiopathic peripheral neuropathy 04/25/2015  . Hypercholesteremia   . Hypertension   . Migraine   . PTSD (post-traumatic stress  disorder)   . Raynaud's disease   . Stroke (HCC)   . Stroke [facility](HCC)      Social History   Social History  . Marital status: Single    Spouse name: N/A  . Number of children: 0  . Years of education: 12th   Occupational History  . n/a    Social History Main Topics  . Smoking status: Former Smoker    Packs/day: 0.20    Types: Cigarettes  . Smokeless tobacco: Never Used  . Alcohol use 0.6 oz/week    1 Glasses of wine per week     Comment: occasionally  . Drug use: No     Comment: patient takes oxycodone for pain  . Sexual activity: No     Comment: pt. reports that she believes she's going through the change; have no sex drive; hot flashes and excessive sweating for a couple of months now   Other Topics Concern  . Not on file   Social History Narrative   Patient lives at home alone.   Caffeine use: 1 cup of coffee and 20 oz of soda    Past Surgical History:  Procedure Laterality Date  . ABDOMINAL HYSTERECTOMY    . HIP SURGERY  Right  . KNEE SURGERY    . 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 TEE WITHOUT CARDIOVERSION N/A 09/05/2014   Procedure: TRANSESOPHAGEAL ECHOCARDIOGRAM (TEE);  Surgeon: Wendall StadePeter C Nishan, MD;  [facility];  Service: Cardiovascular;  Laterality: N/A;  loop after  . WRIST SURGERY      Family History  Problem Relation Age of Onset  . Heart disease Mother   . Heart attack Father   . Colon cancer Brother     Allergies  Allergen Reactions  . Other     Hospital sheet-hives?  . Tape Rash    No tape at all    Current Outpatient Prescriptions on File Prior to Visit  Medication Sig Dispense Refill  . acetaminophen (TYLENOL) 500 MG tablet Take 500 mg by mouth every 6 (six) hours as needed for mild pain, moderate pain or headache.    Marland Kitchen apixaban (ELIQUIS) 5 MG TABS tablet Take 1 tablet (5 mg total) by mouth 2 (two) times daily. 180 tablet 3  . busPIRone (BUSPAR) 15 MG tablet Take 1  tablet (15 mg total) by mouth 2 (two) times daily. 60 tablet 1  . clindamycin (CLEOCIN) 300 MG capsule Take 1 capsule (300 mg total) by mouth 4 (four) times daily. 28 capsule 0  . clotrimazole-betamethasone (LOTRISONE) cream Apply 1 application topically 2 (two) times daily. 30 g 1  . diphenhydrAMINE (BENADRYL) 25 mg capsule Take 1 capsule (25 mg total) by mouth every 4 (four) hours as needed for itching. 10 capsule 0  . FentaNYL 37.5 MCG/HR PT72 Place 37.5 mcg onto the skin every 3 (three) days.     . fluticasone (FLONASE) 50 MCG/ACT nasal spray Place 2 sprays into both nostrils daily. 16 g 1  . gabapentin (NEURONTIN) 800 MG tablet Take 800 mg by mouth 4 (four) times daily.     . hydrOXYzine (VISTARIL) 25 MG capsule TAKE 1 CAPSULE BY MOUTH AT NIGHT TIME AS NEEDED 30 capsule 0  . methocarbamol (ROBAXIN) 750 MG tablet Take 750 mg by mouth 2 (two) times daily.    . nortriptyline (PAMELOR) 75 MG capsule Take 75 mg by mouth every evening.    Marland Kitchen omeprazole (PRILOSEC) 20 MG capsule TAKE 1 CAPSULE BY MOUTH DAILY (Patient taking differently: TAKE 1 CAPSULE BY MOUTH DAILY IN THE MORNING) 30 capsule 2  . rosuvastatin (CRESTOR) 40 MG tablet TAKE 1 TABLET BY MOUTH DAILY (Patient taking differently: TAKE 1 TABLET BY MOUTH DAILY IN THE EVENING) 30 tablet 11  . sertraline (ZOLOFT) 50 MG tablet TAKE 2 TABLETS (100 MG TOTAL) BY MOUTH DAILY. (Patient taking differently: TAKE 2 TABLETS (100 MG TOTAL) BY MOUTH DAILY IN THE EVENING.) 60 tablet 11  . tiZANidine (ZANAFLEX) 4 MG tablet Take 4 mg by mouth 2 (two) times daily.   0  . traZODone (DESYREL) 100 MG tablet TAKE 1 TABLET (100 MG TOTAL) BY MOUTH AT BEDTIME. 30 tablet 2  . verapamil (CALAN-SR) 120 MG CR tablet Take 1 tablet (120 mg total) by mouth daily. 90 tablet 3  . HYDROcodone-acetaminophen (NORCO/VICODIN) 5-325 MG tablet Take 1-2 tablets by mouth every 4 (four) hours as needed for moderate pain. (Patient not taking: Reported on 07/07/2016) 10 tablet 0   No  current facility-administered medications on file prior to visit.     Pulse 85   Temp 98.3 F (36.8 C) (Oral)   Ht 5\' 5"  (1.651 m)   Wt 207 lb 12.8 oz (94.3 kg)   SpO2 97%   BMI 34.58 kg/m       Objective:   Physical Exam  General  Mental Status - Alert. General Appearance - Well groomed. Not in acute distress.  Skin Rashes- No Rashes. No redness, no warmth and no swelling of  face now. Nose not tender. Faint tenderness over both maxillary areas. But pt states feels like soreness from mucosal tissue inside mouth.  HEENT Head- Normal. Ear Auditory Canal - Left- Normal. Right - Normal.Tympanic Membrane- Left- Normal. Right- Normal. Eye Sclera/Conjunctiva- Left- Normal. Right- Normal. Nose & Sinuses Nasal Mucosa- Left-  Boggy and Congested. Right-  Boggy and  Congested.Bilateral maxillary and frontal sinus pressure. Mouth & Throat Lips: Upper Lip- Normal: no dryness, cracking, pallor, cyanosis, or vesicular eruption. Lower Lip-Normal: no dryness, cracking, pallor, cyanosis or vesicular eruption. Buccal Mucosa- Bilateral- No Aphthous ulcers but beefy inflammed red appearance to mucosa on both sides. Tongue has glossitis appearance. Beefy red. Gingiva appears swollen. Oropharynx- No Discharge or Erythema. Tonsils: Characteristics- Bilateral- No Erythema or Congestion. Size/Enlargement- Bilateral- No enlargement. Discharge- bilateral-None.  Neck Neck- Supple. No Masses.   Chest and Lung Exam Auscultation: Breath Sounds:-Clear even and unlabored.  Cardiovascular Auscultation:Rythm- Regular, rate and rhythm. Murmurs & Other Heart Sounds:Ausculatation of the heart reveal- No Murmurs.  Lymphatic Head & Neck General Head & Neck Lymphatics: Bilateral: Description- No Localized lymphadenopathy.       Assessment & Plan:  Your face cellulitis does appear resolved now. Finish last climdamycin tab today.  You appear to have likely moderate to severe  thrush following  antibiotic use.  Start diflucan tomorrow. 1 tab a day for 3 days.  Follow up this Friday or as needed. If not improved may need to do work up and may refer to ENT.  Hakiem Malizia, Ramon Dredge, PA-C

## 2016-07-09 MED FILL — traZODone HCL 100 MG TABS: 100 | 30 days supply | Qty: 30 | Fill #0

## 2016-07-09 NOTE — Telephone Encounter (Signed)
Pt called in to follow up on refill request. She says that she was advised by the pharmacy to call and follow up

## 2016-07-09 NOTE — Telephone Encounter (Addendum)
Called pt. lvm informing her that Rx is at pharmacy for pick up. Also reminded pt of the pharmacy hours.

## 2016-07-10 ENCOUNTER — Telehealth: Payer: Self-pay | Admitting: Cardiology

## 2016-07-10 DIAGNOSIS — M5412 Radiculopathy, cervical region: Secondary | ICD-10-CM | POA: Diagnosis not present

## 2016-07-10 DIAGNOSIS — M5416 Radiculopathy, lumbar region: Secondary | ICD-10-CM | POA: Diagnosis not present

## 2016-07-10 DIAGNOSIS — M542 Cervicalgia: Secondary | ICD-10-CM | POA: Diagnosis not present

## 2016-07-10 DIAGNOSIS — M545 Low back pain: Secondary | ICD-10-CM | POA: Diagnosis not present

## 2016-07-10 MED FILL — LYRICA 75 MG CAPSULE: 75 | 30 days supply | Qty: 60 | Fill #0

## 2016-07-10 MED FILL — fentaNYL 37.5 MCG/HR PT72: 37.5 | 30 days supply | Qty: 10 | Fill #0

## 2016-07-10 NOTE — Telephone Encounter (Signed)
LMOVM requesting that pt send manual transmission b/c home monitor has not updated in at least 14 days.    

## 2016-07-11 ENCOUNTER — Ambulatory Visit (HOSPITAL_BASED_OUTPATIENT_CLINIC_OR_DEPARTMENT_OTHER)
Admission: RE | Admit: 2016-07-11 | Discharge: 2016-07-11 | Disposition: A | Discharge status: Home / Self Care | Payer: Medicare Other | Source: Ambulatory Visit | Attending: Medical | Admitting: Medical

## 2016-07-11 ENCOUNTER — Ambulatory Visit (INDEPENDENT_AMBULATORY_CARE_PROVIDER_SITE_OTHER): Payer: Medicare Other | Admitting: Medical

## 2016-07-11 ENCOUNTER — Encounter: Payer: Self-pay | Admitting: Medical

## 2016-07-11 ENCOUNTER — Ambulatory Visit (INDEPENDENT_AMBULATORY_CARE_PROVIDER_SITE_OTHER): Payer: Medicare Other | Admitting: *Deleted

## 2016-07-11 VITALS — BP 116/70 | HR 74 | Temp 98.2°F | Ht 65.0 in | Wt 206.8 lb

## 2016-07-11 DIAGNOSIS — Z8673 Personal history of transient ischemic attack (TIA), and cerebral infarction without residual deficits: Secondary | ICD-10-CM

## 2016-07-11 DIAGNOSIS — G9389 Other specified disorders of brain: Secondary | ICD-10-CM | POA: Insufficient documentation

## 2016-07-11 DIAGNOSIS — I639 Cerebral infarction, unspecified: Secondary | ICD-10-CM | POA: Diagnosis not present

## 2016-07-11 DIAGNOSIS — B370 Candidal stomatitis: Secondary | ICD-10-CM

## 2016-07-11 DIAGNOSIS — R61 Generalized hyperhidrosis: Secondary | ICD-10-CM

## 2016-07-11 DIAGNOSIS — R42 Dizziness and giddiness: Secondary | ICD-10-CM | POA: Insufficient documentation

## 2016-07-11 DIAGNOSIS — R232 Flushing: Secondary | ICD-10-CM

## 2016-07-11 DIAGNOSIS — G894 Chronic pain syndrome: Secondary | ICD-10-CM

## 2016-07-11 DIAGNOSIS — R202 Paresthesia of skin: Secondary | ICD-10-CM

## 2016-07-11 DIAGNOSIS — K140 Glossitis: Secondary | ICD-10-CM | POA: Diagnosis not present

## 2016-07-11 LAB — CBC WITH DIFFERENTIAL/PLATELET
BASOS PCT: 0.6 % (ref 0.0–3.0)
Basophils Absolute: 0 10*3/uL (ref 0.0–0.1)
EOS PCT: 3.1 % (ref 0.0–5.0)
Eosinophils Absolute: 0.2 10*3/uL (ref 0.0–0.7)
HCT: 39 % (ref 36.0–46.0)
HEMOGLOBIN: 13 g/dL (ref 12.0–15.0)
LYMPHS ABS: 2.1 10*3/uL (ref 0.7–4.0)
Lymphocytes Relative: 30.5 % (ref 12.0–46.0)
MCHC: 33.4 g/dL (ref 30.0–36.0)
MCV: 89.4 fl (ref 78.0–100.0)
MONO ABS: 0.5 10*3/uL (ref 0.1–1.0)
Monocytes Relative: 7.2 % (ref 3.0–12.0)
NEUTROS ABS: 4.1 10*3/uL (ref 1.4–7.7)
NEUTROS PCT: 58.6 % (ref 43.0–77.0)
Platelets: 222 10*3/uL (ref 150.0–400.0)
RBC: 4.37 Mil/uL (ref 3.87–5.11)
RDW: 14.6 % (ref 11.5–15.5)
WBC: 7 10*3/uL (ref 4.0–10.5)

## 2016-07-11 LAB — COMPREHENSIVE METABOLIC PANEL
ALBUMIN: 4.1 g/dL (ref 3.5–5.2)
ALT: 12 U/L (ref 0–35)
AST: 16 U/L (ref 0–37)
Alkaline Phosphatase: 75 U/L (ref 39–117)
BUN: 9 mg/dL (ref 6–23)
CHLORIDE: 105 meq/L (ref 96–112)
CO2: 30 mEq/L (ref 19–32)
Calcium: 9.2 mg/dL (ref 8.4–10.5)
Creatinine, Ser: 0.9 mg/dL (ref 0.40–1.20)
GFR: 69.23 mL/min (ref >60.00)
Glucose, Bld: 94 mg/dL (ref 70–99)
POTASSIUM: 4.5 meq/L (ref 3.5–5.1)
SODIUM: 139 meq/L (ref 135–145)
Total Bilirubin: 0.3 mg/dL (ref 0.2–1.2)
Total Protein: 7.1 g/dL (ref 6.0–8.3)

## 2016-07-11 LAB — VITAMIN B12: VITAMIN B 12: 441 pg/mL (ref 211–911)

## 2016-07-11 LAB — FOLLICLE STIMULATING HORMONE: FSH: 41.9 m[IU]/mL

## 2016-07-11 MED ORDER — MECLIZINE HCL 12.5 MG PO TABS: 12.5000 mg | ORAL_TABLET | Freq: Three times a day (TID) | ORAL | 0 refills | Status: DC | PRN

## 2016-07-11 MED ORDER — FLUCONAZOLE 150 MG PO TABS: ORAL_TABLET | ORAL | 0 refills | Status: DC

## 2016-07-11 MED FILL — FLUCONAZOLE 150 MG TABLET: 150 | 3 days supply | Qty: 3 | Fill #0

## 2016-07-11 MED FILL — MECLIZINE 12.5 MG CAPLET: 12.5 | 33 days supply | Qty: 100 | Fill #0

## 2016-07-11 NOTE — Progress Notes (Signed)
Subjective:    Patient ID: Dawn Escobar, female    DOB: 1961/04/11, 55 y.o.   MRN: [id number]  HPI  Pt in for follow up.  She states that her tongue feels a little better. Pt states things done taste well and tongue still hurts. I thought she had thrush following clindamycin. See last note. I wrote her for 3 days of diflucan and wanted her to follow up today.    Pt states she  sw pain specialist and he won't fill her fentanyl patch. They kept her on in the hospital. Hospital also gave her vicodin. She filled the script. She told pain MD and now her pain MD won't refill her fentanyl. Pt saw pain specialist yesterday.   Pt states at times will have transient sensation of mild sweating and tingling sensation with some dizziness. This is going on for a couple of weeks. Last for about three minute and then with rest resolves. No chest pain and no sob reported. No leg pain reported. No symptoms currently on exam.  Pt states when has symptoms will occur on walking. States other times will have sweating sensation, tingling and dizziness even at rest. Theses episodes have been occurring 1-2 times a day for about 2 weeks. No ha or gross motor/sensory function deficits reported.  Pt had last mentrual cycle late 20's. Hx of cyst on hysterectomy but has her ovaries. Pt feels like maybe hot flashes at times. In past she used black cohash and did not help her hot flash/sweat sensation.         Review of Systems  Constitutional: Positive for diaphoresis. Negative for chills, fatigue and fever.  HENT:       Tongue discomfort persists.  Respiratory: Negative for apnea, cough, choking and wheezing.   Cardiovascular: Negative for chest pain and palpitations.  Gastrointestinal: Negative for abdominal pain.  Genitourinary: Negative for difficulty urinating.  Musculoskeletal: Negative for back pain.  Skin: Negative for pallor and rash.  Neurological: Positive for dizziness. Negative for syncope,  speech difficulty, weakness, numbness and headaches.       Tingling sensation at times. None presently. See hpi.  Hematological: Negative for adenopathy. Does not bruise/bleed easily.  Psychiatric/Behavioral: Negative for behavioral problems, confusion, dysphoric mood, hallucinations and sleep disturbance. The patient is not nervous/anxious.    Past Medical History:  Diagnosis Date  . Anxiety   . Anxiety   . Atrial fibrillation (HCC)   . GERD (gastroesophageal reflux disease) 04/25/2015  . Hereditary and idiopathic peripheral neuropathy 04/25/2015  . Hypercholesteremia   . Hypertension   . Migraine   . PTSD (post-traumatic stress disorder)   . Raynaud's disease   . Stroke (HCC)   . Stroke [facility](HCC)      Social History   Social History  . Marital status: Single    Spouse name: N/A  . Number of children: 0  . Years of education: 12th   Occupational History  . n/a    Social History Main Topics  . Smoking status: Former Smoker    Packs/day: 0.20    Types: Cigarettes  . Smokeless tobacco: Never Used  . Alcohol use 0.6 oz/week    1 Glasses of wine per week     Comment: occasionally  . Drug use: No     Comment: patient takes oxycodone for pain  . Sexual activity: No     Comment: pt. reports that she believes she's going through the change; have no sex drive; hot flashes and excessive sweating  for a couple of months now   Other Topics Concern  . Not on file   Social History Narrative   Patient lives at home alone.   Caffeine use: 1 cup of coffee and 20 oz of soda    Past Surgical History:  Procedure Laterality Date  . ABDOMINAL HYSTERECTOMY    . HIP SURGERY  Right  . KNEE SURGERY    . 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 TEE WITHOUT CARDIOVERSION N/A 09/05/2014   Procedure: TRANSESOPHAGEAL ECHOCARDIOGRAM (TEE);  Surgeon: Wendall Stade, MD;  Location: [facility] ENDOSCOPY;  Service: Cardiovascular;  Laterality: N/A;   loop after  . WRIST SURGERY      Family History  Problem Relation Age of Onset  . Heart disease Mother   . Heart attack Father   . Colon cancer Brother     Allergies  Allergen Reactions  . Other     Hospital sheet-hives?  . Tape Rash    No tape at all    Current Outpatient Prescriptions on File Prior to Visit  Medication Sig Dispense Refill  . acetaminophen (TYLENOL) 500 MG tablet Take 500 mg by mouth every 6 (six) hours as needed for mild pain, moderate pain or headache.    Marland Kitchen apixaban (ELIQUIS) 5 MG TABS tablet Take 1 tablet (5 mg total) by mouth 2 (two) times daily. 180 tablet 3  . busPIRone (BUSPAR) 15 MG tablet Take 1 tablet (15 mg total) by mouth 2 (two) times daily. 60 tablet 1  . clotrimazole-betamethasone (LOTRISONE) cream Apply 1 application topically 2 (two) times daily. 30 g 1  . diphenhydrAMINE (BENADRYL) 25 mg capsule Take 1 capsule (25 mg total) by mouth every 4 (four) hours as needed for itching. 10 capsule 0  . FentaNYL 37.5 MCG/HR PT72 Place 37.5 mcg onto the skin every 3 (three) days.     . fluconazole (DIFLUCAN) 150 MG tablet 1 tab po q day for 3 days 3 tablet 0  . fluticasone (FLONASE) 50 MCG/ACT nasal spray Place 2 sprays into both nostrils daily. 16 g 1  . gabapentin (NEURONTIN) 800 MG tablet Take 800 mg by mouth 4 (four) times daily.     . hydrOXYzine (VISTARIL) 25 MG capsule TAKE 1 CAPSULE BY MOUTH AT NIGHT TIME AS NEEDED 30 capsule 0  . magic mouthwash SOLN 5 ml po qid swish and spit. 200 mL 0  . methocarbamol (ROBAXIN) 750 MG tablet Take 750 mg by mouth 2 (two) times daily.    . nortriptyline (PAMELOR) 75 MG capsule Take 75 mg by mouth every evening.    Marland Kitchen omeprazole (PRILOSEC) 20 MG capsule TAKE 1 CAPSULE BY MOUTH DAILY (Patient taking differently: TAKE 1 CAPSULE BY MOUTH DAILY IN THE MORNING) 30 capsule 2  . rosuvastatin (CRESTOR) 40 MG tablet TAKE 1 TABLET BY MOUTH DAILY (Patient taking differently: TAKE 1 TABLET BY MOUTH DAILY IN THE EVENING) 30 tablet  11  . sertraline (ZOLOFT) 50 MG tablet TAKE 2 TABLETS (100 MG TOTAL) BY MOUTH DAILY. (Patient taking differently: TAKE 2 TABLETS (100 MG TOTAL) BY MOUTH DAILY IN THE EVENING.) 60 tablet 11  . tiZANidine (ZANAFLEX) 4 MG tablet Take 4 mg by mouth 2 (two) times daily.   0  . traZODone (DESYREL) 100 MG tablet TAKE 1 TABLET (100 MG TOTAL) BY MOUTH AT BEDTIME. 30 tablet 2  . verapamil (CALAN-SR) 120 MG CR tablet Take 1 tablet (120 mg total) by  mouth daily. 90 tablet 3   No current facility-administered medications on file prior to visit.     BP 116/70   Pulse 74   Temp 98.2 F (36.8 C) (Oral)   Ht 5\' 5"  (1.651 m)   Wt 206 lb 12.8 oz (93.8 kg)   SpO2 97%   BMI 34.41 kg/m       Objective:   Physical Exam  General  Mental Status - Alert. General Appearance - Well groomed. Not in acute distress.  Skin Rashes- No Rashes. No redness, no warmth and no swelling of face now. Nose not tender. NO longer  tenderness over  maxillary areas. But pt states feels like soreness from mucosal tissue inside mouth.  HEENT Head- Normal. Ear Auditory Canal - Left- Normal. Right - Normal.Tympanic Membrane- Left- Normal. Right- Normal. Eye Sclera/Conjunctiva- Left- Normal. Right- Normal. Nose & Sinuses Nasal Mucosa- Left-  Boggy and Congested. Right-  Boggy and  Congested.Bilateral maxillary and frontal sinus pressure. Mouth & Throat Lips: Upper Lip- Normal: no dryness, cracking, pallor, cyanosis, or vesicular eruption. Lower Lip-Normal: no dryness, cracking, pallor, cyanosis or vesicular eruption. Buccal Mucosa- Bilateral- No Aphthous ulcers but beefy inflammed red appearance to mucosa on both sides. Tongue has glossitis appearance. Beefy red(but not as bad as before). Gingiva appears swollen but less Oropharynx- No Discharge or Erythema. Tonsils: Characteristics- Bilateral- No Erythema or Congestion. Size/Enlargement- Bilateral- No enlargement. Discharge- bilateral-None.  Neck Neck- Supple. No  Masses.   Chest and Lung Exam Auscultation: Breath Sounds:-Clear even and unlabored.  Cardiovascular Auscultation:Rythm- Regular, rate and rhythm. Murmurs & Other Heart Sounds:Ausculatation of the heart reveal- No Murmurs.  Lymphatic Head & Neck General Head & Neck Lymphatics: Bilateral: Description- No Localized lymphadenopathy.    Neurologic Cranial Nerve exam:- CN III-XII intact(No nystagmus), symmetric smile. Drift Test:- No drift. Romberg Exam:- Negative. . Finger to Nose:- Normal/Intact Strength:- Only baseline mild rt upper ext faint weakness compared to lt side.(no difference from previous baseline prior stroke)  Lower ext strength 5/5 symmetric.     Assessment & Plan:  For your likely thrush will rx 3 more days diflucan. But will also go ahead and refer you to ENT.  For dizziness, tingling and intermittent sweating episodes will get cbc, cmp and  fsh(sweating maybe hot flashes?). Also will get ct of head stat today. If you have recurrent episodes that don't resolve with rest or any motor or neurologic deficits as discussed then ED evaluation main hospital since in that event you may need mri.  Will rx meclizine for dizziness.  I am trying to think of solution to your dilema regarding Pain Dr. No longer prescribing you fentanyl.  Follow up in 7 days or as needed

## 2016-07-11 NOTE — Progress Notes (Signed)
Pre visit review using our clinic tool,if applicable. No additional management support is needed unless otherwise documented below in the visit note.  

## 2016-07-11 NOTE — Patient Instructions (Addendum)
For your likely thrush will rx 3 more days diflucan. But will also go ahead and refer you to ENT.  For dizziness, tingling and intermittent sweating episodes will get cbc, cmp and fsh(sweating maybe hot flashes?). Also will get ct of head stat today. If you have recurrent episodes that don't resolve with rest or any motor or neurologic deficits as discussed then ED evaluation main hospital since in that event you may need mri.  Will rx meclizine for dizziness.  I am trying to think of solution to your dilema regarding Pain Dr. No longer prescribing you fentanyl.  Follow up in 7 days or as needed

## 2016-07-12 LAB — CUP PACEART REMOTE DEVICE CHECK: MDC IDC SESS DTM: 20170906220752

## 2016-07-12 NOTE — Progress Notes (Signed)
Carelink summary report received. Battery status OK. Normal device function. No new symptom episodes, tachy episodes, brady, or pause episodes. No new AF episodes. Monthly summary reports and ROV/PRN 

## 2016-07-14 MED FILL — GABAPENTIN 800 MG TABLET: 800 | 30 days supply | Qty: 120 | Fill #1

## 2016-07-14 NOTE — Progress Notes (Signed)
Carelink Summary Report / Loop Recorder 

## 2016-07-15 ENCOUNTER — Telehealth: Payer: Self-pay | Admitting: Medical

## 2016-07-15 DIAGNOSIS — K146 Glossodynia: Secondary | ICD-10-CM

## 2016-07-15 NOTE — Telephone Encounter (Signed)
Pt called in to give the provider an update. Pt says that she's not feeling well. She would like to be advised on if she should come back in or not

## 2016-07-17 ENCOUNTER — Encounter: Payer: Self-pay | Admitting: Cardiology

## 2016-07-17 MED FILL — NORTRIPTYLINE HCL 50 MG CAP: 50 | 30 days supply | Qty: 60 | Fill #0

## 2016-07-17 MED FILL — ROSUVASTATIN CALCIUM 40 MG: 40 | 30 days supply | Qty: 30 | Fill #4

## 2016-07-17 NOTE — Telephone Encounter (Signed)
Pt is still having symptoms of mouth/tongue pain. I put in referral to ENT. Have they given her appointment date. Will you check. Notify me of date and please notify pt tomorrow. Then I can decide if I need to see.

## 2016-07-17 NOTE — Telephone Encounter (Signed)
Will you call pt pharmacy and refill her magic mouth wash. Then call pt and let her now we are working on referring to ENT.

## 2016-07-17 NOTE — Telephone Encounter (Signed)
Called patient left message for return call regarding symptoms. Advised to schedule appointment tomorrow after lunch if not feeling any better.

## 2016-07-17 NOTE — Telephone Encounter (Signed)
Patient called stating that her symptoms are, her mouth is still the same, nothing has changed. Still using mouthwash but is out so will need refill if he wants her to continue using it. Plse adv

## 2016-07-17 NOTE — Telephone Encounter (Signed)
Will you call pt and see what is going on. She states does not feel well? Recommend schedule tomorrow early afternoon. 1pm. But make her 30 minute appointment.

## 2016-07-18 ENCOUNTER — Other Ambulatory Visit: Payer: Self-pay

## 2016-07-18 MED ORDER — MAGIC MOUTHWASH: ORAL | 0 refills | Status: DC

## 2016-07-18 MED FILL — CMPD-LIDO/DIPH/MYLAN 1:1:1: 10 days supply | Qty: 200 | Fill #0

## 2016-07-18 NOTE — Telephone Encounter (Signed)
Called patient to inform her that medication has been sent to pharmacy and we are working on ENT referral. Left this message on answering machine.

## 2016-07-21 NOTE — Telephone Encounter (Signed)
I put in ENT referral. Will you see if she can be seen this week? Please.

## 2016-07-21 NOTE — Telephone Encounter (Signed)
There is not a referral in pts chart for ENT.

## 2016-07-21 NOTE — Telephone Encounter (Signed)
I contacted [facility] and they are to call pt to schedule this week.

## 2016-07-24 ENCOUNTER — Telehealth: Payer: Self-pay | Admitting: Medical

## 2016-07-24 MED FILL — SERTRALINE HCL 50 MG TABLET: 50 | 30 days supply | Qty: 60 | Fill #5

## 2016-07-26 NOTE — Telephone Encounter (Signed)
Opened to review 

## 2016-07-28 DIAGNOSIS — K146 Glossodynia: Secondary | ICD-10-CM | POA: Diagnosis not present

## 2016-07-28 DIAGNOSIS — B370 Candidal stomatitis: Secondary | ICD-10-CM | POA: Diagnosis not present

## 2016-07-30 MED FILL — FLUCONAZOLE 100 MG TABLET: 100 | 10 days supply | Qty: 10 | Fill #0

## 2016-07-31 ENCOUNTER — Ambulatory Visit (HOSPITAL_COMMUNITY): Payer: Self-pay | Admitting: Psychiatry

## 2016-08-04 MED FILL — traZODone HCL 100 MG TABS: 100 | 30 days supply | Qty: 30 | Fill #1

## 2016-08-04 MED FILL — OMEPRAZOLE DR 20 MG CAPSULE: 20 | 30 days supply | Qty: 30 | Fill #2

## 2016-08-04 MED FILL — METHOCARBAMOL 750 MG TABLET: 750 | 30 days supply | Qty: 90 | Fill #2

## 2016-08-04 MED FILL — tiZANidine HCL 4 MG TABS: 4 | 30 days supply | Qty: 90 | Fill #1

## 2016-08-06 ENCOUNTER — Telehealth: Payer: Self-pay

## 2016-08-06 NOTE — Telephone Encounter (Signed)
I don't understand the note. She is going to see Dr. Laurian BrimBai. Who is the provider that she feels it is unnecessary to see?  Regarding her dizziness. I can only advise if she has severe dizziness or dizziness with neurologic signs or symptoms then ED evaluation. She has history of stroke and thus dizziness is always concern. She has ct done on last visit. But if symptoms worsen she may need CT and/or MRI as discussed. So remind her with her history ED of choice would [facility] since the have MRI available.  You could also offer [facility]. Could do physical exam/neuro check and advise accordingly after neuro exam as well as bp check.

## 2016-08-06 NOTE — Telephone Encounter (Signed)
I spoke with the patient and she states that she was recently in the hospital for a skin infection and she was told by the doctor that at the hospital could no longer give her the patch. She states that she is going to see Dr. Laurian BrimBai tomorrow and she states that she does not need to go the doctor tomorrow if this situation has not been fixed.  Patient reports some dizziness that comes and goes through out the day. Patient reports the medicine helps her some but she states the episodes are more frequent.   Please advise.

## 2016-08-07 DIAGNOSIS — M5412 Radiculopathy, cervical region: Secondary | ICD-10-CM | POA: Diagnosis not present

## 2016-08-07 DIAGNOSIS — M545 Low back pain: Secondary | ICD-10-CM | POA: Diagnosis not present

## 2016-08-07 DIAGNOSIS — M797 Fibromyalgia: Secondary | ICD-10-CM | POA: Diagnosis not present

## 2016-08-07 DIAGNOSIS — M542 Cervicalgia: Secondary | ICD-10-CM | POA: Diagnosis not present

## 2016-08-07 NOTE — Telephone Encounter (Signed)
I spoke with the patient and she has a follow up with Ramon DredgeEdward on 08/08/16.

## 2016-08-08 ENCOUNTER — Ambulatory Visit (INDEPENDENT_AMBULATORY_CARE_PROVIDER_SITE_OTHER): Payer: Medicare Other | Admitting: Medical

## 2016-08-08 ENCOUNTER — Telehealth: Payer: Self-pay | Admitting: Medical

## 2016-08-08 ENCOUNTER — Encounter: Payer: Self-pay | Admitting: Medical

## 2016-08-08 VITALS — BP 110/60 | HR 71 | Temp 98.3°F | Ht 65.0 in | Wt 209.8 lb

## 2016-08-08 DIAGNOSIS — R42 Dizziness and giddiness: Secondary | ICD-10-CM

## 2016-08-08 DIAGNOSIS — I639 Cerebral infarction, unspecified: Secondary | ICD-10-CM

## 2016-08-08 DIAGNOSIS — G894 Chronic pain syndrome: Secondary | ICD-10-CM | POA: Diagnosis not present

## 2016-08-08 DIAGNOSIS — R232 Flushing: Secondary | ICD-10-CM

## 2016-08-08 MED ORDER — MECLIZINE HCL 12.5 MG PO TABS: 12.5000 mg | ORAL_TABLET | Freq: Three times a day (TID) | ORAL | 0 refills | Status: DC | PRN

## 2016-08-08 MED FILL — LYRICA 100 MG CAPSULE: 100 | 30 days supply | Qty: 60 | Fill #0

## 2016-08-08 MED FILL — fentaNYL 25 MCG/HR PT72: 25 | 15 days supply | Qty: 5 | Fill #0

## 2016-08-08 MED FILL — MECLIZINE 25 MG TABLET: 25 | 10 days supply | Qty: 15 | Fill #0

## 2016-08-08 NOTE — Patient Instructions (Addendum)
For your chronic pain and hx of described inadvertent violation of pain contract will try to talk/discuss with Dr. Laurian BrimBai. See if he will be willing to rx in future.  For dizzines rx meclizine but try to get you in with your neurologist. See when you are scheduled. If stroke signs or symptoms with dizziness then ED evalutaion. Rx refill of meclizine.  See ENT if you tongue does not improve.  For hot flash will try to consider other treatments since you failed black cohash and hormones not recommended due your history.  Follow up in 2 months or as needed

## 2016-08-08 NOTE — Telephone Encounter (Signed)
Pt sees Dr. Laurian BrimBai orthopedist. Pt had issue with filling norco after hospitalization given by the hospital. She was on fentanyl and was on contract with Dr. Lynda RainwaterBail So currently he won't prescribe her any more fentanyl. She states he wants to discuss her case with me. If that is the case could call his office and give him my cell phone number. It is [phone]. I will be back on August 18, 2016. But if he needs to call be before then I will have my cell.

## 2016-08-08 NOTE — Progress Notes (Signed)
Pre visit review using our clinic review tool, if applicable. No additional management support is needed unless otherwise documented below in the visit note. 

## 2016-08-08 NOTE — Progress Notes (Signed)
Subjective:    Patient ID: Dawn Escobar, female    DOB: 1961-08-26, 55 y.o.   MRN: [id number]  HPI   Pt in for follow up.   Pt states she went to Dr. Laurian BrimBai. Pt was given norco in hospital when she had cellulitis. Then they discharge her on norco. Pt states that Dr. Laurian BrimBai would not fill her fentanyl post hospitalization  because she violated the contract. Pt states hospital gave her the medication. Pt states she was not aware that they gave her norco in the hospital.   She was given the norco medication for mouth pain. Pt had some severe thrush while she was in hospital.  Pt did see ENT for this. Pain in her tongue is less now. But taste is not back to normal.    Review of Systems  Constitutional: Negative for chills, fatigue and fever.  Respiratory: Negative for cough, chest tightness, shortness of breath and wheezing.   Cardiovascular: Negative for chest pain and palpitations.  Gastrointestinal: Negative for abdominal pain, nausea and vomiting.  Endocrine:       Hot flashes.  Musculoskeletal: Positive for back pain and neck pain. Negative for myalgias.  Skin: Negative for rash.  Neurological: Negative for dizziness and headaches.       Pt dizziness. Is still present intermitently. But feels weak when it occurs. Will last a minute or 2 then subsides. Happens several times a day. But not gross motor or sensory function deficits.    Hematological: Negative for adenopathy. Does not bruise/bleed easily.  Psychiatric/Behavioral: Negative for behavioral problems and confusion.   Past Medical History:  Diagnosis Date  . Anxiety   . Anxiety   . Atrial fibrillation (HCC)   . GERD (gastroesophageal reflux disease) 04/25/2015  . Hereditary and idiopathic peripheral neuropathy 04/25/2015  . Hypercholesteremia   . Hypertension   . Migraine   . PTSD (post-traumatic stress disorder)   . Raynaud's disease   . Stroke (HCC)   . Stroke [facility](HCC)      Social History   Social History  .  Marital status: Single    Spouse name: N/A  . Number of children: 0  . Years of education: 12th   Occupational History  . n/a    Social History Main Topics  . Smoking status: Former Smoker    Packs/day: 0.20    Types: Cigarettes  . Smokeless tobacco: Never Used  . Alcohol use 0.6 oz/week    1 Glasses of wine per week     Comment: occasionally  . Drug use: No     Comment: patient takes oxycodone for pain  . Sexual activity: No     Comment: pt. reports that she believes she's going through the change; have no sex drive; hot flashes and excessive sweating for a couple of months now   Other Topics Concern  . Not on file   Social History Narrative   Patient lives at home alone.   Caffeine use: 1 cup of coffee and 20 oz of soda    Past Surgical History:  Procedure Laterality Date  . ABDOMINAL HYSTERECTOMY    . HIP SURGERY  Right  . KNEE SURGERY    . 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 TEE WITHOUT CARDIOVERSION N/A 09/05/2014   Procedure: TRANSESOPHAGEAL ECHOCARDIOGRAM (TEE);  Surgeon: Wendall StadePeter C Nishan, MD;  [facility] ENDOSCOPY;  Service: Cardiovascular;  Laterality: N/A;  loop after  . WRIST  SURGERY      Family History  Problem Relation Age of Onset  . Heart disease Mother   . Heart attack Father   . Colon cancer Brother     Allergies  Allergen Reactions  . Other     Hospital sheet-hives?  . Tape Rash    No tape at all    Current Outpatient Prescriptions on File Prior to Visit  Medication Sig Dispense Refill  . acetaminophen (TYLENOL) 500 MG tablet Take 500 mg by mouth every 6 (six) hours as needed for mild pain, moderate pain or headache.    Marland Kitchen. apixaban (ELIQUIS) 5 MG TABS tablet Take 1 tablet (5 mg total) by mouth 2 (two) times daily. 180 tablet 3  . busPIRone (BUSPAR) 15 MG tablet Take 1 tablet (15 mg total) by mouth 2 (two) times daily. 60 tablet 1  . clotrimazole-betamethasone (LOTRISONE) cream  Apply 1 application topically 2 (two) times daily. 30 g 1  . diphenhydrAMINE (BENADRYL) 25 mg capsule Take 1 capsule (25 mg total) by mouth every 4 (four) hours as needed for itching. 10 capsule 0  . FentaNYL 37.5 MCG/HR PT72 Place 37.5 mcg onto the skin every 3 (three) days.     . fluconazole (DIFLUCAN) 150 MG tablet 1 tab po q day for 3 days 3 tablet 0  . fluticasone (FLONASE) 50 MCG/ACT nasal spray Place 2 sprays into both nostrils daily. 16 g 1  . gabapentin (NEURONTIN) 800 MG tablet Take 800 mg by mouth 4 (four) times daily.     . hydrOXYzine (VISTARIL) 25 MG capsule TAKE 1 CAPSULE BY MOUTH AT NIGHT TIME AS NEEDED 30 capsule 0  . meclizine (ANTIVERT) 12.5 MG tablet Take 1 tablet (12.5 mg total) by mouth 3 (three) times daily as needed for dizziness. 30 tablet 0  . methocarbamol (ROBAXIN) 750 MG tablet Take 750 mg by mouth 2 (two) times daily.    . nortriptyline (PAMELOR) 75 MG capsule Take 75 mg by mouth every evening.    Marland Kitchen. omeprazole (PRILOSEC) 20 MG capsule TAKE 1 CAPSULE BY MOUTH DAILY (Patient taking differently: TAKE 1 CAPSULE BY MOUTH DAILY IN THE MORNING) 30 capsule 2  . rosuvastatin (CRESTOR) 40 MG tablet TAKE 1 TABLET BY MOUTH DAILY (Patient taking differently: TAKE 1 TABLET BY MOUTH DAILY IN THE EVENING) 30 tablet 11  . sertraline (ZOLOFT) 50 MG tablet TAKE 2 TABLETS (100 MG TOTAL) BY MOUTH DAILY. (Patient taking differently: TAKE 2 TABLETS (100 MG TOTAL) BY MOUTH DAILY IN THE EVENING.) 60 tablet 11  . tiZANidine (ZANAFLEX) 4 MG tablet Take 4 mg by mouth 2 (two) times daily.   0  . traZODone (DESYREL) 100 MG tablet TAKE 1 TABLET (100 MG TOTAL) BY MOUTH AT BEDTIME. 30 tablet 2  . verapamil (CALAN-SR) 120 MG CR tablet Take 1 tablet (120 mg total) by mouth daily. 90 tablet 3   No current facility-administered medications on file prior to visit.     BP 110/60 (BP Location: Left Arm, Patient Position: Sitting)   Pulse 71   Temp 98.3 F (36.8 C) (Oral)   Ht 5\' 5"  (1.651 m)   Wt 209  lb 12.8 oz (95.2 kg)   SpO2 98%   BMI 34.91 kg/m       Objective:   Physical Exam   General Mental Status- Alert. General Appearance- Not in acute distress.   Skin General: Color- Normal Color. Moisture- Normal Moisture.  Neck Carotid Arteries- Normal color. Moisture- Normal Moisture. No carotid bruits.  No JVD.  Chest and Lung Exam Auscultation: Breath Sounds:-Normal.  Cardiovascular Auscultation:Rythm- Regular. Murmurs & Other Heart Sounds:Auscultation of the heart reveals- No Murmurs.  Abdomen Inspection:-Inspeection Normal. Palpation/Percussion:Note:No mass. Palpation and Percussion of the abdomen reveal- Non Tender, Non Distended + BS, no rebound or guarding.  heent- mild glossitis appearance.   Neurologic Cranial Nerve exam:- CN III-XII intact(No nystagmus), symmetric smile. Drift Test:- No drift. Romberg Exam:- Negative.  Heal to Toe Gait exam:-Normal. Finger to Nose:- Normal/Intact Strength:- 5/5 equal and symmetric strength both upper and lower extremities.    Assessment & Plan:  For your chronic pain and hx of described inadvertent violation of pain contract will try to talk/discuss with Dr. Laurian Brim. See if he will be willing to rx in future.  For dizzines rx meclizine but try to get you in with your neurologist. See when you are scheduled. If stroke signs or symptoms with dizziness then ED evalutaion. Rx refill of meclizine.  See ENT if you tongue does not improve.  For hot flash will try to consider other treatment since you failed black cohash and hormone therapy not recommended due your history.  Follow up in 2 months or as needed   Dawn Escobar, Ramon Dredge, [employer]

## 2016-08-11 ENCOUNTER — Ambulatory Visit (INDEPENDENT_AMBULATORY_CARE_PROVIDER_SITE_OTHER): Payer: Medicare Other | Admitting: *Deleted

## 2016-08-11 ENCOUNTER — Other Ambulatory Visit: Payer: Self-pay | Admitting: Medical

## 2016-08-11 DIAGNOSIS — I639 Cerebral infarction, unspecified: Secondary | ICD-10-CM

## 2016-08-12 MED FILL — busPIRone HCL 15 MG TABS: 15 | 30 days supply | Qty: 60 | Fill #0

## 2016-08-12 MED FILL — HYDROXYZINE PAM 25 MG CAP: 25 | 30 days supply | Qty: 30 | Fill #0

## 2016-08-12 NOTE — Progress Notes (Signed)
Carelink Summary Report / Loop Recorder 

## 2016-08-13 ENCOUNTER — Encounter: Payer: Self-pay | Admitting: Nurse Practitioner

## 2016-08-13 ENCOUNTER — Ambulatory Visit (INDEPENDENT_AMBULATORY_CARE_PROVIDER_SITE_OTHER): Payer: Medicare Other | Admitting: Nurse Practitioner

## 2016-08-13 VITALS — BP 124/72 | HR 75 | Ht 65.0 in | Wt 213.4 lb

## 2016-08-13 DIAGNOSIS — I471 Supraventricular tachycardia: Secondary | ICD-10-CM | POA: Diagnosis not present

## 2016-08-13 DIAGNOSIS — I639 Cerebral infarction, unspecified: Secondary | ICD-10-CM

## 2016-08-13 DIAGNOSIS — I480 Paroxysmal atrial fibrillation: Secondary | ICD-10-CM

## 2016-08-13 NOTE — Patient Instructions (Addendum)
Medication Instructions:   Your physician recommends that you continue on your current medications as directed. Please refer to the Current Medication list given to you today.    If you need a refill on your cardiac medications before your next appointment, please call your pharmacy.  Labwork: NONE ORDERED  TODAY    Testing/Procedures: NONE ORDERED  TODAY    Follow-Up:  Your physician wants you to follow-up in:  IN  6  MONTHS WITH  SEILER   You will receive a reminder letter in the mail two months in advance. If you don't receive a letter, please call our office to schedule the follow-up appointment.     Any Other Special Instructions Will Be Listed Below (If Applicable).                                                                                                                                                   

## 2016-08-13 NOTE — Progress Notes (Signed)
Electrophysiology Office Note Date: 08/13/2016  ID:  Dawn Escobar, DOB 16-May-1961, MRN [id number]  PCP: Esperanza RichtersSaguier, Edward, PA-C Electrophysiologist: Allred  CC: Atrial fibrillation follow up/surgical clearance  Dawn Escobar is a 55 y.o. female seen today for Dr Johney FrameAllred.  She presents today for routine electrophysiology followup.  Since last being seen in our clinic, the patient reports doing reasonably well. She is pending discectomy and fusion and is needing surgical clearance today. She is having occasional dizzy spells with loss of grip strength that remind her of spells she had just prior to strokes. She denies chest pain, palpitations, dyspnea, PND, orthopnea, nausea, vomiting, syncope, edema, weight gain, or early satiety.  Device History: MDT ILR implanted 2015 for cryptogenic stroke   Past Medical History:  Diagnosis Date  . Anxiety   . Atrial fibrillation (HCC)   . GERD (gastroesophageal reflux disease) 04/25/2015  . Hereditary and idiopathic peripheral neuropathy 04/25/2015  . Hypercholesteremia   . Hypertension   . Migraine   . PTSD (post-traumatic stress disorder)   . Raynaud's disease   . Stroke (HCC)   . Tobacco abuse    Past Surgical History:  Procedure Laterality Date  . ABDOMINAL HYSTERECTOMY    . HIP SURGERY  Right  . KNEE SURGERY    . 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 TEE WITHOUT CARDIOVERSION N/A 09/05/2014   Procedure: TRANSESOPHAGEAL ECHOCARDIOGRAM (TEE);  Surgeon: Wendall StadePeter C Nishan, MD;  [facility];  Service: Cardiovascular;  Laterality: N/A;  loop after  . WRIST SURGERY      Current Outpatient Prescriptions  Medication Sig Dispense Refill  . acetaminophen (TYLENOL) 500 MG tablet Take 500 mg by mouth every 6 (six) hours as needed for mild pain, moderate pain or headache.    Marland Kitchen. apixaban (ELIQUIS) 5 MG TABS tablet Take 1 tablet (5 mg total) by mouth 2 (two) times daily. 180  tablet 3  . busPIRone (BUSPAR) 15 MG tablet TAKE 1 TABLET (15 MG TOTAL) BY MOUTH 2 (TWO) TIMES DAILY. 60 tablet 1  . clotrimazole-betamethasone (LOTRISONE) cream Apply 1 application topically 2 (two) times daily. 30 g 1  . diphenhydrAMINE (BENADRYL) 25 mg capsule Take 1 capsule (25 mg total) by mouth every 4 (four) hours as needed for itching. 10 capsule 0  . FentaNYL 37.5 MCG/HR PT72 Place 37.5 mcg onto the skin every 3 (three) days.     . fluconazole (DIFLUCAN) 150 MG tablet 1 tab po q day for 3 days 3 tablet 0  . fluticasone (FLONASE) 50 MCG/ACT nasal spray Place 2 sprays into both nostrils daily. 16 g 1  . gabapentin (NEURONTIN) 800 MG tablet Take 800 mg by mouth 4 (four) times daily.     . hydrOXYzine (VISTARIL) 25 MG capsule TAKE 1 CAPSULE BY MOUTH AT NIGHT TIME AS NEEDED 30 capsule 0  . meclizine (ANTIVERT) 12.5 MG tablet Take 1 tablet (12.5 mg total) by mouth 3 (three) times daily as needed for dizziness. 30 tablet 0  . meclizine (ANTIVERT) 12.5 MG tablet Take 1 tablet (12.5 mg total) by mouth 3 (three) times daily as needed for dizziness. 30 tablet 0  . methocarbamol (ROBAXIN) 750 MG tablet Take 750 mg by mouth 2 (two) times daily.    . nortriptyline (PAMELOR) 75 MG capsule Take 75 mg by mouth every evening.    Marland Kitchen. omeprazole (PRILOSEC) 20 MG capsule TAKE 1 CAPSULE BY MOUTH DAILY (Patient taking differently: TAKE 1  CAPSULE BY MOUTH DAILY IN THE MORNING) 30 capsule 2  . rosuvastatin (CRESTOR) 40 MG tablet TAKE 1 TABLET BY MOUTH DAILY (Patient taking differently: TAKE 1 TABLET BY MOUTH DAILY IN THE EVENING) 30 tablet 11  . sertraline (ZOLOFT) 50 MG tablet TAKE 2 TABLETS (100 MG TOTAL) BY MOUTH DAILY. (Patient taking differently: TAKE 2 TABLETS (100 MG TOTAL) BY MOUTH DAILY IN THE EVENING.) 60 tablet 11  . tiZANidine (ZANAFLEX) 4 MG tablet Take 4 mg by mouth 2 (two) times daily.   0  . traZODone (DESYREL) 100 MG tablet TAKE 1 TABLET (100 MG TOTAL) BY MOUTH AT BEDTIME. 30 tablet 2  . verapamil  (CALAN-SR) 120 MG CR tablet Take 1 tablet (120 mg total) by mouth daily. 90 tablet 3   No current facility-administered medications for this visit.     Allergies:   Other and Tape   Social History: Social History   Social History  . Marital status: Single    Spouse name: N/A  . Number of children: 0  . Years of education: 12th   Occupational History  . n/a    Social History Main Topics  . Smoking status: Former Smoker    Packs/day: 0.20    Types: Cigarettes  . Smokeless tobacco: Never Used  . Alcohol use 0.6 oz/week    1 Glasses of wine per week     Comment: occasionally  . Drug use: No     Comment: patient takes oxycodone for pain  . Sexual activity: No     Comment: pt. reports that she believes she's going through the change; have no sex drive; hot flashes and excessive sweating for a couple of months now   Other Topics Concern  . Not on file   Social History Narrative   Patient lives at home alone.   Caffeine use: 1 cup of coffee and 20 oz of soda    Family History: Family History  Problem Relation Age of Onset  . Heart disease Mother   . Heart attack Father   . Colon cancer Brother      Review of Systems: All other systems reviewed and are otherwise negative except as noted above.   Physical Exam: VS:  There were no vitals taken for this visit. , BMI There is no height or weight on file to calculate BMI.  GEN- The patient is well appearing, alert and oriented x 3 today.   HEENT: normocephalic, atraumatic; sclera clear, conjunctiva pink; hearing intact; oropharynx clear; neck supple  Lungs- Clear to ausculation bilaterally, normal work of breathing.  No wheezes, rales, rhonchi Heart- Regular rate and rhythm, no murmurs, rubs or gallops  GI- soft, non-tender, non-distended, bowel sounds present  Extremities- no clubbing, cyanosis, or edema; DP/PT/radial pulses 2+ bilaterally MS- no significant deformity or atrophy Skin- warm and dry, no rash or lesion;  ILR incision well healed Psych- euthymic mood, full affect Neuro- strength and sensation are intact  ILR Interrogation- reviewed in detail today,  See PACEART report  EKG:  EKG is ordered today. EKG ordered today demonstrates sinus rhythm  Recent Labs: 06/28/2016: Magnesium 2.2; TSH 3.788 07/11/2016: ALT 12; BUN 9; Creatinine, Ser 0.90; Hemoglobin 13.0; Platelets 222.0; Potassium 4.5; Sodium 139   Wt Readings from Last 3 Encounters:  08/08/16 209 lb 12.8 oz (95.2 kg)  07/11/16 206 lb 12.8 oz (93.8 kg)  07/07/16 207 lb 12.8 oz (94.3 kg)     Other studies Reviewed: Additional studies/ records that were reviewed today include:  Dr Jenel LucksAllred's notes  Echo 05/2015 demonstrated EF 55-60%, grade 1 diastolic dysfunction, no RWMA, mild to moderate AR  Assessment and Plan:  1.  Paroxysmal atrial fibrillation Burden 0% by device interrogation today Continue Eliquis for CHADS2VASC of at least 4 Recent CBC, BMET normal  2.  Prior stroke Follow up with neurology as scheduled I have encouraged her to move appointment up with recurrent dizziness.  Neuro exam is non-focal today  3.  Tobacco abuse Remains quit - congratulated on efforts today   4.  SVT No further episodes on Verapamil  5.  Surgical clearance Pt is able to exercise 4mets without chest pain or shortness of breath Echo 05/2015 normal She is at acceptable cardiac risk for surgery. Ok to hold Eliquis as needed around time of surgery.    Current medicines are reviewed at length with the patient today.   The patient does not have concerns regarding her medicines.  The following changes were made today:  none  Labs/ tests ordered today include: none   Disposition:   Follow up with me in 6 months    Signed, Gypsy BalsamAmber Seiler, NP 08/13/2016 7:35 AM  [facility] [address] [place] [address] [phone] (office) [phone] (fax)

## 2016-08-14 MED FILL — GABAPENTIN 800 MG TABLET: 800 | 30 days supply | Qty: 120 | Fill #2

## 2016-08-15 MED FILL — VERAPAMIL ER 120 MG TABLET: 120 | 90 days supply | Qty: 90 | Fill #3

## 2016-08-15 MED FILL — NORTRIPTYLINE HCL 50 MG CAP: 50 | 30 days supply | Qty: 60 | Fill #1

## 2016-08-15 MED FILL — ROSUVASTATIN CALCIUM 40 MG: 40 | 30 days supply | Qty: 30 | Fill #5

## 2016-08-17 LAB — CUP PACEART REMOTE DEVICE CHECK
Implantable Pulse Generator Implant Date: 20151201
MDC IDC SESS DTM: 20171006233954

## 2016-08-17 NOTE — Progress Notes (Signed)
Carelink summary report received. Battery status OK. Normal device function. No new symptom episodes, tachy episodes, brady, or pause episodes. No new AF episodes. Monthly summary reports and ROV/PRN 

## 2016-08-23 IMAGING — MR MR CERVICAL SPINE W/O CM
4 of 5 series · 20 of 48 positions shown · non-contrast
Comparison: Brain MRI 06/01/2015.  Cervical spine MRI 10/04/2014.

CLINICAL DATA: 54-year-old female with pain in the left cervical
spine radiating to the arms since physical therapy for recent
stroke. Left C5-C6 radiculopathy. Initial encounter.

EXAM:
MRI CERVICAL SPINE WITHOUT CONTRAST
TECHNIQUE: Multiplanar, multisequence MR imaging of the cervical spine was
performed. No intravenous contrast was administered.

[Series 2: T2 · sagittal · 3.0mm · 0.41mm/px · 6 of 14 slices shown (1 of 3)]
[im 1/14]
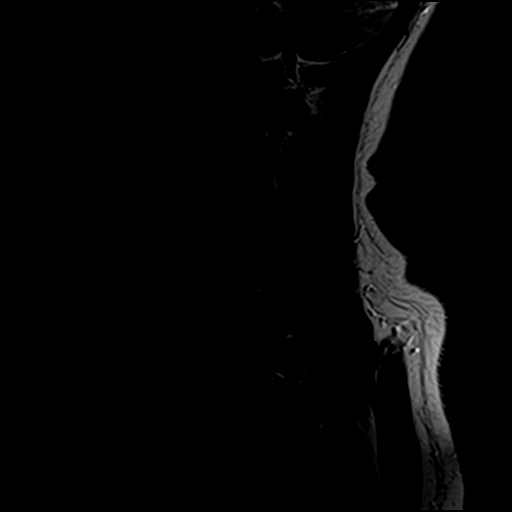
[im 3/14]
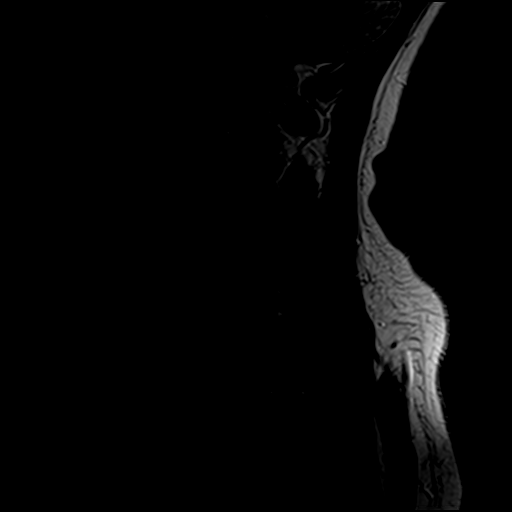
[im 6/14]
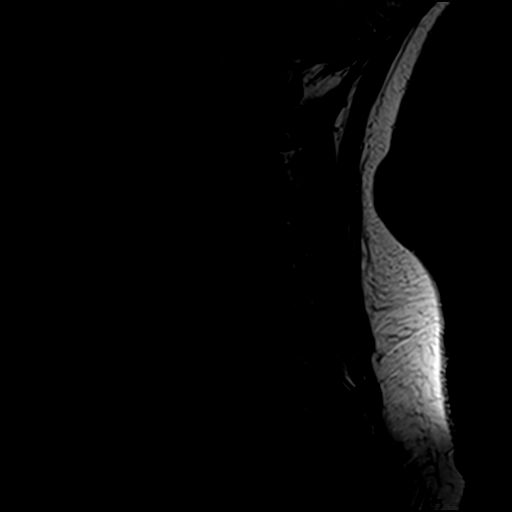
[im 8/14]
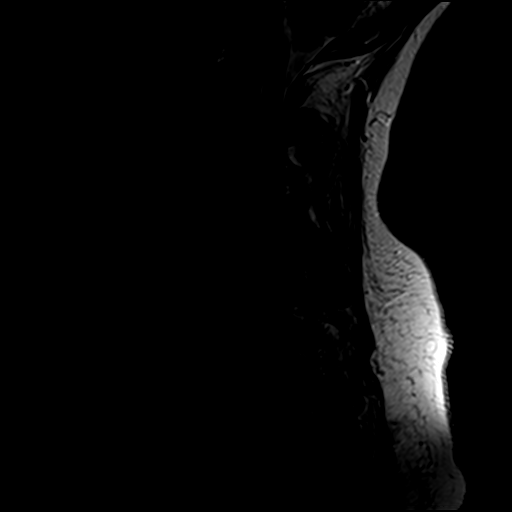
[im 11/14]
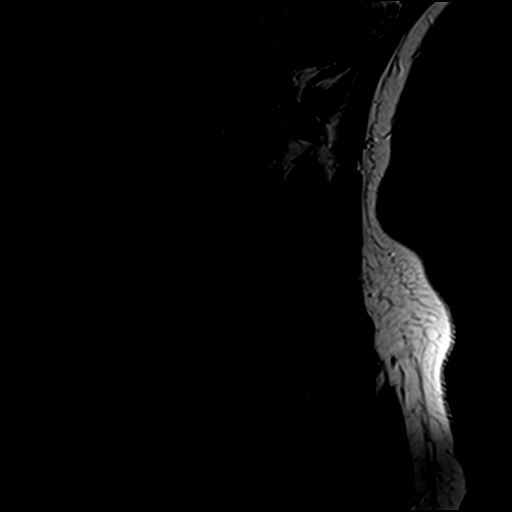
[im 14/14]
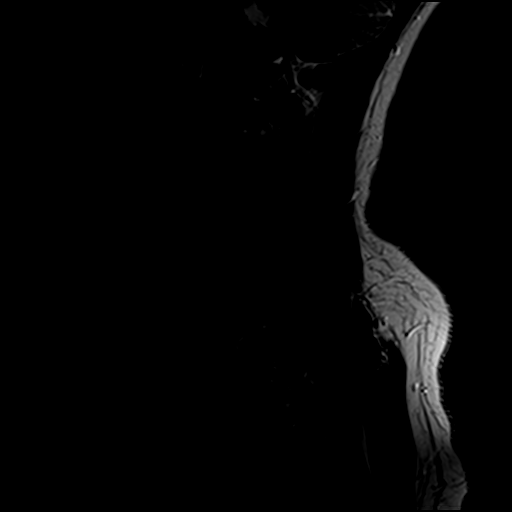

[Series 3: T1 · sagittal · 3.0mm · 0.41mm/px · 3 of 14 slices shown]
[im 3/14]
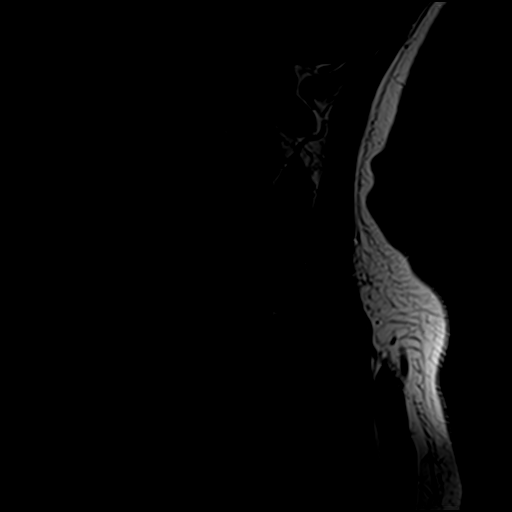
[im 7/14]
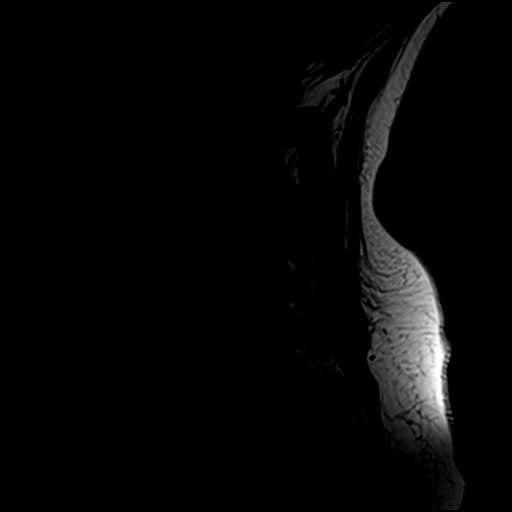
[im 11/14]
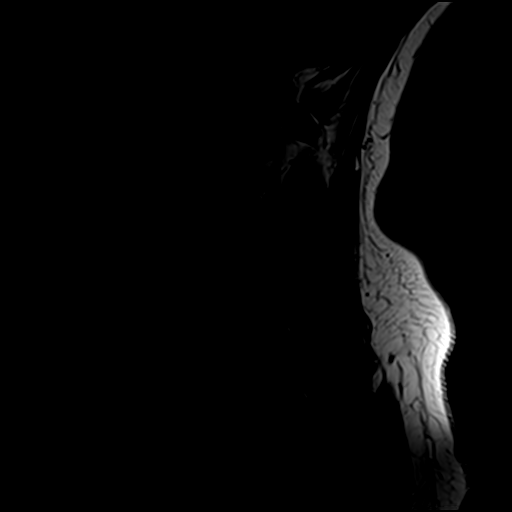

[Series 5: T2 · axial · 3.0mm · 0.39mm/px · z∈[-42,+58]mm · 8 of 27 slices shown (2 of 3)]
[im 1/27]
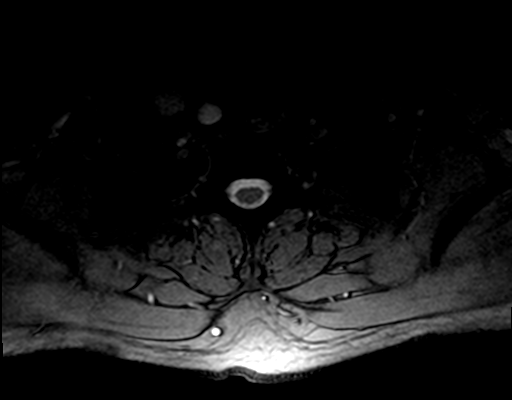
[im 5/27]
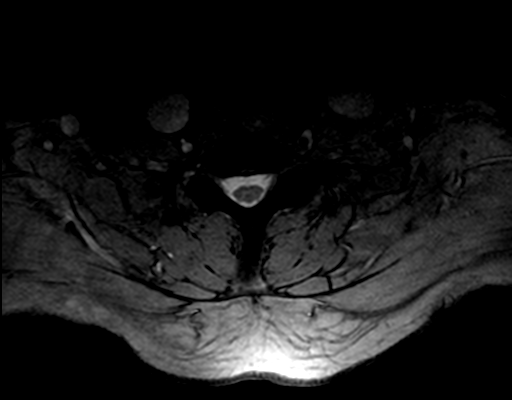
[im 9/27]
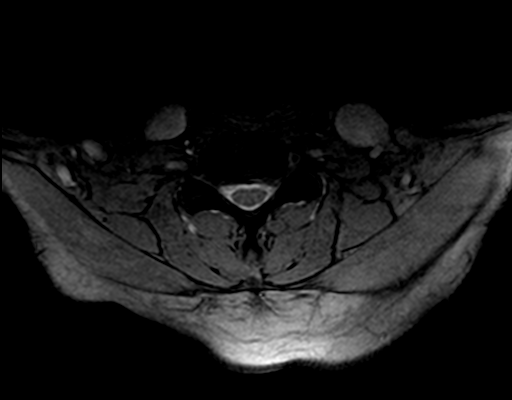
[im 13/27]
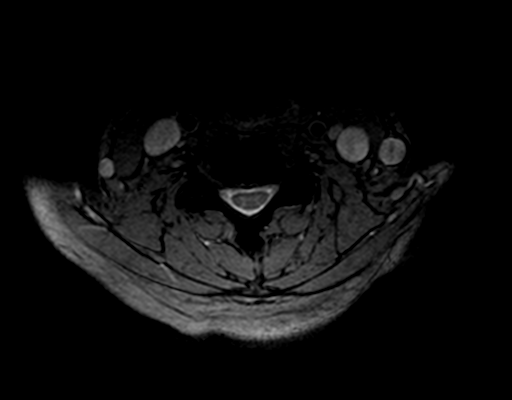
[im 15/27]
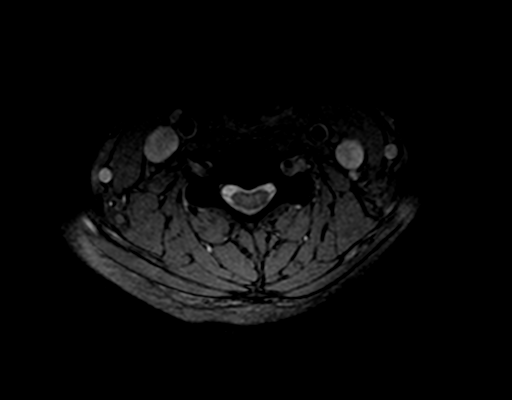
[im 19/27]
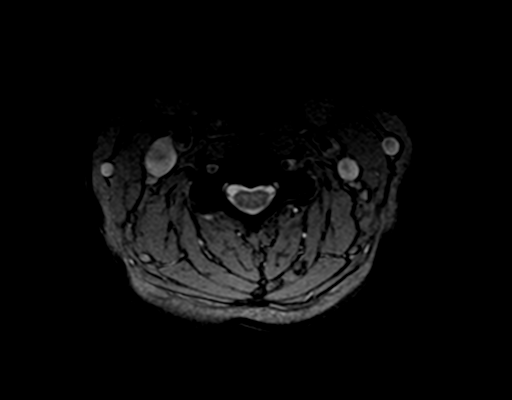
[im 23/27]
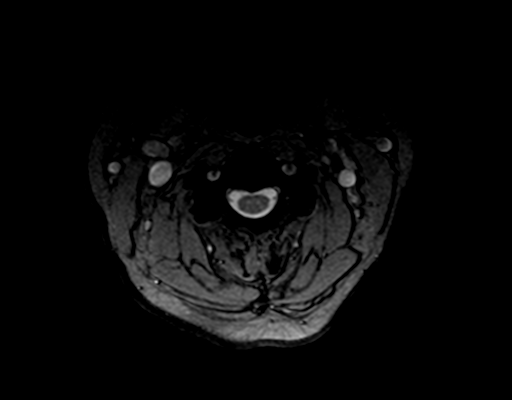
[im 27/27]
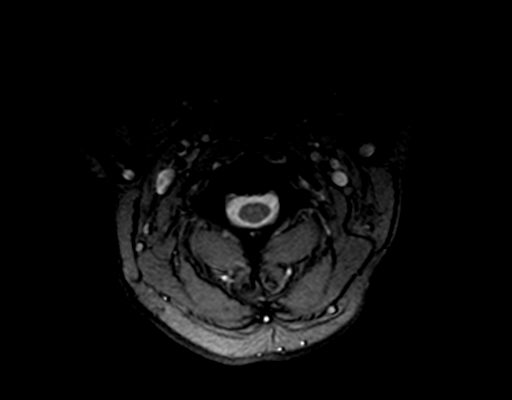

[Series 6: T2 · axial · 3.0mm · 0.39mm/px · z∈[-27,+43]mm · 3 of 27 slices shown (3 of 3)]
[im 5/27]
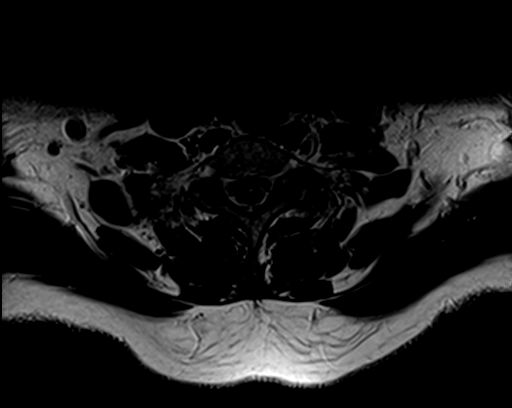
[im 15/27]
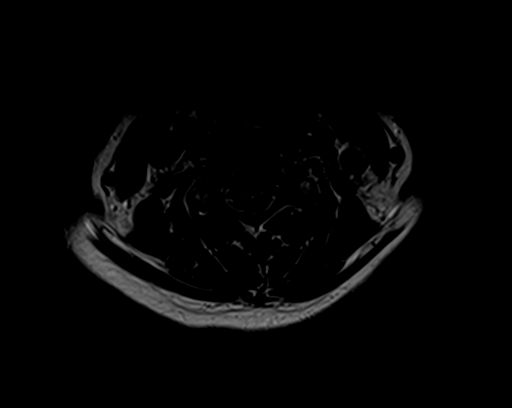
[im 23/27]
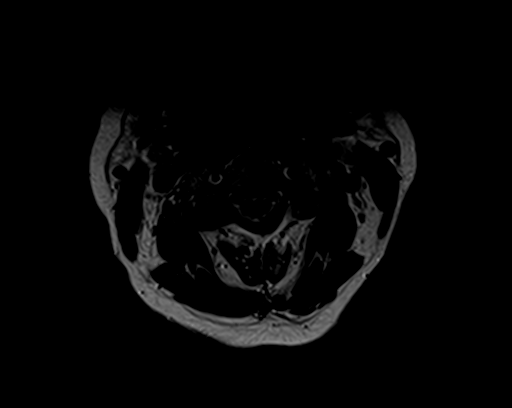

[20 of 48 positions shown; findings below may reference images not displayed]

FINDINGS: Less reversal of cervical lordosis compared to 6593. Multilevel
chronic endplate degeneration. Degenerative endplate marrow edema at
C5-C6 has also regressed. No acute osseous abnormality identified.

Cervicomedullary junction is within normal limits. No cervical
spinal cord signal abnormality.

At the thoracic inlet on the right there is a chronic area of fluid
signal (series 4, image 3) which has not significantly changed since
6593, favor this is related to physiologic lymphatics. Otherwise
negative paraspinal soft tissues.

C2-C3: Stable small central disc protrusion. No significant spinal
stenosis. Mild facet hypertrophy. Uncovertebral hypertrophy on the
right. Stable moderate right C3 foraminal stenosis.

C3-C4: Stable mild to moderate facet and ligament flavum
hypertrophy. No spinal stenosis. Moderate left and mild right C4
foraminal stenosis, stable.

C4-C5: Stable small central disc protrusion. Underlying
circumferential disc bulge. Mild to moderate facet and uncovertebral
hypertrophy. No significant spinal stenosis. Stable moderate
bilateral C5 foraminal stenosis.

C5-C6: Disc space loss. Circumferential disc osteophyte complex
eccentric to the right. Broad-based posterior component OF DISC
appears stable. Mild spinal stenosis with mild flattening of the
ventral spinal cord is stable. Uncovertebral hypertrophy greater on
the right. Severe right and moderate to severe left C6 foraminal
stenosis is stable.

C6-C7: Chronic circumferential disc osteophyte complex eccentric to
the right. Broad-based posterior component of disc is stable. Stable
mild spinal stenosis, no cord mass effect. Bilateral uncovertebral
hypertrophy. Severe bilateral C7 foraminal stenosis is stable.

C7-T1: Moderate to severe bilateral facet hypertrophy is chronic.
Mild ligament flavum hypertrophy. Negative disc. No spinal stenosis.
Mild bilateral C8 foraminal stenosis is stable.

No upper thoracic spinal stenosis.
IMPRESSION: 1. Stable cervical spine since [DATE]. Chronic multifactorial mild spinal and severe foraminal stenosis
at C5-C6 and C6-C7.
3. Chronic multifactorial moderate left C4 and bilateral C5 level
neural foraminal stenosis.

## 2016-08-25 ENCOUNTER — Telehealth: Payer: Self-pay

## 2016-08-25 NOTE — Telephone Encounter (Signed)
Patient states she had a mix up with her pain patches. States Dr. Laurian BrimBai would like to talk to E. Saguier regarding this. Phone number to Dr. Ricky StabsBais office is [phone]. Patient states she needs for this to be cleared because she is in pain.

## 2016-08-26 MED FILL — SERTRALINE HCL 50 MG TABLET: 50 | 30 days supply | Qty: 60 | Fill #6

## 2016-08-26 NOTE — Telephone Encounter (Signed)
Notify pt that I called Dr Ricky StabsBais office number. I could not get through to Dr. Laurian BrimBai. Never got to talk with Dr. Laurian BrimBai or any staff. Does pt have nurse number to his office? I called twice but failed to get through after waiting about 15 minutes.

## 2016-09-01 ENCOUNTER — Other Ambulatory Visit: Payer: Self-pay | Admitting: Medical

## 2016-09-01 NOTE — Telephone Encounter (Signed)
I spoke with the patient and she states that she has tried to contact Dr. Fransisca KaufmannBai's office and the only number that was listed in the previous message.   Patient states she has an appointment next week and she will need this completed before next week.

## 2016-09-01 NOTE — Telephone Encounter (Signed)
I left a message for the patient that Ramon Dredgedward wanted to know if the patient knew a direct number to Dr. Ricky StabsBais office or if he had a nurse that Ramon Dredgedward could leave a message for about the patients pain patches.   Pt was asked to call back with any further information.

## 2016-09-01 NOTE — Telephone Encounter (Signed)
Pt need to have 30 minute appointment. Will you ask her to call their office and maybe wait on line. Then ask to speak to Dr Laurian BrimBai nurse or get her direct number so I can call. I tried twice and waited 15 minutes on the general line. If she could get me the back line. Or if someone hear could call and wait for me. Otherwise I just doubt will find time to call and wait 15-30 minutes.

## 2016-09-02 MED FILL — OMEPRAZOLE DR 20 MG CAPSULE: 20 | 30 days supply | Qty: 30 | Fill #0

## 2016-09-02 NOTE — Telephone Encounter (Signed)
I did call finally got through to Dr. Laurian BrimBai office. Explained calling on pt behalf to discuss pt prior fentanyl use and her filling other pain med that hospital gave her on discharge. Left my cell number. Staff member stated he would pass message to Dr. Laurian BrimBai.

## 2016-09-02 NOTE — Telephone Encounter (Signed)
I left a message on the automated system at Dr. Fransisca KaufmannBai's office to call our office back regarding a medication that was prescribed by Dr. Fransisca KaufmannBai's office.

## 2016-09-03 NOTE — Telephone Encounter (Signed)
Caller name: Angelique BlonderDenise Relationship to patient: self Can be reached: [phone]  Reason for call: pt called asking if we heard from Dr. Fransisca KaufmannBai's office and requesting call back.

## 2016-09-04 ENCOUNTER — Encounter: Payer: Self-pay | Admitting: Medical

## 2016-09-04 DIAGNOSIS — M5416 Radiculopathy, lumbar region: Secondary | ICD-10-CM | POA: Diagnosis not present

## 2016-09-04 DIAGNOSIS — M542 Cervicalgia: Secondary | ICD-10-CM | POA: Diagnosis not present

## 2016-09-04 DIAGNOSIS — M545 Low back pain: Secondary | ICD-10-CM | POA: Diagnosis not present

## 2016-09-04 DIAGNOSIS — M5412 Radiculopathy, cervical region: Secondary | ICD-10-CM | POA: Diagnosis not present

## 2016-09-04 MED FILL — fentaNYL 37.5 MCG/HR PT72: 37.5 | 30 days supply | Qty: 10 | Fill #0

## 2016-09-04 NOTE — Telephone Encounter (Signed)
Caller name: Joni Reiningicole  Relation to pt: Dr. Laurian BrimBai  Call back number: [phone]    Reason for call:  Dr. Laurian BrimBai office following back up regarding message below would like to discuss pain medication, please advise

## 2016-09-04 NOTE — Telephone Encounter (Addendum)
I spoke with the patient and advised her that Ramon Dredgedward spoke with Dr. Laurian BrimBai about her pain medication and she voices understanding that her Rx would be taken care of so she would be able to have her pain medicine.

## 2016-09-04 NOTE — Telephone Encounter (Signed)
I did get call from Dr. Laurian BrimBai.  We discussed her filling of hydrocodone after admission to hospital. I thought it was inadvertant and explained no red flags that I remember in past with her. He is going to do drug screen today. I will plan to do random as well with her and send results to his office as well when I do those. Dr. Laurian BrimBai indicted he would give her a second chance.

## 2016-09-04 NOTE — Telephone Encounter (Signed)
error:[id number] ° °

## 2016-09-05 ENCOUNTER — Other Ambulatory Visit: Payer: Self-pay | Admitting: Medical

## 2016-09-05 MED FILL — traZODone HCL 100 MG TABS: 100 | 30 days supply | Qty: 30 | Fill #2

## 2016-09-05 MED FILL — HYDROXYZINE PAM 25 MG CAP: 25 | 30 days supply | Qty: 30 | Fill #0

## 2016-09-09 ENCOUNTER — Ambulatory Visit (INDEPENDENT_AMBULATORY_CARE_PROVIDER_SITE_OTHER): Payer: Medicare Other | Admitting: *Deleted

## 2016-09-09 DIAGNOSIS — I639 Cerebral infarction, unspecified: Secondary | ICD-10-CM

## 2016-09-10 NOTE — Progress Notes (Signed)
Carelink Summary Report / Loop Recorder 

## 2016-09-15 MED FILL — ROSUVASTATIN CALCIUM 40 MG: 40 | 30 days supply | Qty: 30 | Fill #6

## 2016-09-15 MED FILL — GABAPENTIN 800 MG TABLET: 800 | 30 days supply | Qty: 120 | Fill #0

## 2016-09-15 MED FILL — NORTRIPTYLINE HCL 50 MG CAP: 50 | 30 days supply | Qty: 60 | Fill #2

## 2016-09-15 MED FILL — busPIRone HCL 15 MG TABS: 15 | 30 days supply | Qty: 60 | Fill #1

## 2016-09-15 MED FILL — METHOCARBAMOL 750 MG TABLET: 750 | 30 days supply | Qty: 90 | Fill #0

## 2016-09-15 MED FILL — ELIQUIS 5 MG TABLET: 5 | 90 days supply | Qty: 180 | Fill #3

## 2016-09-25 LAB — CUP PACEART REMOTE DEVICE CHECK
Implantable Pulse Generator Implant Date: 20151201
MDC IDC SESS DTM: 20171106004108

## 2016-09-25 MED FILL — SERTRALINE HCL 50 MG TABLET: 50 | 30 days supply | Qty: 60 | Fill #7

## 2016-09-25 NOTE — Progress Notes (Signed)
Carelink summary report received. Battery status OK. Normal device function. No new symptom episodes, tachy episodes, brady, or pause episodes. No new AF episodes. Monthly summary reports and ROV/PRN 

## 2016-10-02 ENCOUNTER — Other Ambulatory Visit: Payer: Self-pay | Admitting: Medical

## 2016-10-02 MED FILL — tiZANidine HCL 4 MG TABS: 4 | 30 days supply | Qty: 90 | Fill #2

## 2016-10-02 MED FILL — LYRICA 100 MG CAPSULE: 100 | 30 days supply | Qty: 60 | Fill #0

## 2016-10-03 MED FILL — OMEPRAZOLE DR 20 MG CAPSULE: 20 | 30 days supply | Qty: 30 | Fill #1

## 2016-10-04 NOTE — Telephone Encounter (Signed)
Refilled pt trazadone

## 2016-10-06 MED FILL — traZODone HCL 100 MG TABS: 100 | 30 days supply | Qty: 30 | Fill #0

## 2016-10-09 ENCOUNTER — Ambulatory Visit (INDEPENDENT_AMBULATORY_CARE_PROVIDER_SITE_OTHER): Payer: Medicare Other | Admitting: *Deleted

## 2016-10-09 DIAGNOSIS — I639 Cerebral infarction, unspecified: Secondary | ICD-10-CM | POA: Diagnosis not present

## 2016-10-10 NOTE — Progress Notes (Signed)
Carelink Summary Report / Loop Recorder 

## 2016-10-15 ENCOUNTER — Other Ambulatory Visit: Payer: Self-pay | Admitting: Medical

## 2016-10-15 MED FILL — ROSUVASTATIN CALCIUM 40 MG: 40 | 30 days supply | Qty: 30 | Fill #7

## 2016-10-15 MED FILL — HYDROXYZINE PAM 25 MG CAP: 25 | 30 days supply | Qty: 30 | Fill #0

## 2016-10-16 MED FILL — NORTRIPTYLINE HCL 50 MG CAP: 50 | 30 days supply | Qty: 60 | Fill #0

## 2016-10-16 MED FILL — GABAPENTIN 800 MG TABLET: 800 | 30 days supply | Qty: 120 | Fill #1

## 2016-10-26 LAB — CUP PACEART REMOTE DEVICE CHECK
Date Time Interrogation Session: 20171206004430
Implantable Pulse Generator Implant Date: 20151201

## 2016-10-26 NOTE — Progress Notes (Signed)
Carelink summary report received. Battery status OK. Normal device function. No new symptom episodes, tachy episodes, brady, or pause episodes. No new AF episodes. Monthly summary reports and ROV/PRN 

## 2016-10-27 ENCOUNTER — Other Ambulatory Visit: Payer: Self-pay | Admitting: Medical

## 2016-10-27 DIAGNOSIS — B349 Viral infection, unspecified: Secondary | ICD-10-CM | POA: Diagnosis not present

## 2016-10-27 MED FILL — SERTRALINE HCL 50 MG TABLET: 50 | 30 days supply | Qty: 60 | Fill #8

## 2016-10-27 MED FILL — busPIRone HCL 15 MG TABS: 15 | 30 days supply | Qty: 60 | Fill #0

## 2016-10-27 MED FILL — BENZONATATE 100 MG CAP: 100 | 10 days supply | Qty: 30 | Fill #0

## 2016-10-30 DIAGNOSIS — M797 Fibromyalgia: Secondary | ICD-10-CM | POA: Diagnosis not present

## 2016-10-30 DIAGNOSIS — M542 Cervicalgia: Secondary | ICD-10-CM | POA: Diagnosis not present

## 2016-10-30 DIAGNOSIS — M545 Low back pain: Secondary | ICD-10-CM | POA: Diagnosis not present

## 2016-10-30 DIAGNOSIS — M5416 Radiculopathy, lumbar region: Secondary | ICD-10-CM | POA: Diagnosis not present

## 2016-10-30 MED FILL — fentaNYL 37.5 MCG/HR PT72: 37.5 | 30 days supply | Qty: 10 | Fill #0

## 2016-11-03 ENCOUNTER — Other Ambulatory Visit: Payer: Self-pay | Admitting: Nurse Practitioner

## 2016-11-03 MED FILL — LYRICA 100 MG CAPSULE: 100 | 30 days supply | Qty: 60 | Fill #0

## 2016-11-03 MED FILL — VERAPAMIL ER 120 MG TABLET: 120 | 90 days supply | Qty: 90 | Fill #0

## 2016-11-06 MED FILL — traZODone HCL 100 MG TABS: 100 | 30 days supply | Qty: 30 | Fill #1

## 2016-11-06 MED FILL — OMEPRAZOLE DR 20 MG CAPSULE: 20 | 30 days supply | Qty: 30 | Fill #2

## 2016-11-10 ENCOUNTER — Ambulatory Visit (INDEPENDENT_AMBULATORY_CARE_PROVIDER_SITE_OTHER): Payer: Medicare Other | Admitting: *Deleted

## 2016-11-10 DIAGNOSIS — I639 Cerebral infarction, unspecified: Secondary | ICD-10-CM | POA: Diagnosis not present

## 2016-11-11 NOTE — Progress Notes (Signed)
Carelink Summary Report / Loop Recorder 

## 2016-11-14 ENCOUNTER — Other Ambulatory Visit: Payer: Self-pay | Admitting: Medical

## 2016-11-14 MED FILL — tiZANidine HCL 4 MG TABS: 4 | 30 days supply | Qty: 90 | Fill #0

## 2016-11-14 MED FILL — NORTRIPTYLINE HCL 50 MG CAP: 50 | 30 days supply | Qty: 60 | Fill #1

## 2016-11-14 MED FILL — METHOCARBAMOL 750 MG TABLET: 750 | 30 days supply | Qty: 90 | Fill #1

## 2016-11-18 MED FILL — HYDROXYZINE PAM 25 MG CAP: 25 | 30 days supply | Qty: 30 | Fill #0

## 2016-11-20 DIAGNOSIS — M7989 Other specified soft tissue disorders: Secondary | ICD-10-CM | POA: Diagnosis not present

## 2016-11-20 DIAGNOSIS — M7732 Calcaneal spur, left foot: Secondary | ICD-10-CM | POA: Diagnosis not present

## 2016-11-20 DIAGNOSIS — M79672 Pain in left foot: Secondary | ICD-10-CM | POA: Diagnosis not present

## 2016-11-20 DIAGNOSIS — M7750 Other enthesopathy of unspecified foot: Secondary | ICD-10-CM | POA: Diagnosis not present

## 2016-11-20 MED FILL — predniSONE 20 MG TABS: 20 | 5 days supply | Qty: 10 | Fill #0

## 2016-11-22 LAB — CUP PACEART REMOTE DEVICE CHECK
MDC IDC PG IMPLANT DT: 20151201
MDC IDC SESS DTM: 20180105010741

## 2016-11-22 NOTE — Progress Notes (Signed)
Carelink summary report received. Battery status OK. Normal device function. No new symptom episodes, tachy episodes, brady, or pause episodes. No new AF episodes. Monthly summary reports and ROV/PRN 

## 2016-11-25 MED FILL — busPIRone HCL 15 MG TABS: 15 | 30 days supply | Qty: 60 | Fill #1

## 2016-11-25 MED FILL — ROSUVASTATIN CALCIUM 40 MG: 40 | 30 days supply | Qty: 30 | Fill #8

## 2016-11-25 MED FILL — GABAPENTIN 800 MG TABLET: 800 | 30 days supply | Qty: 120 | Fill #2

## 2016-11-25 MED FILL — SERTRALINE HCL 50 MG TABLET: 50 | 30 days supply | Qty: 60 | Fill #9

## 2016-12-04 ENCOUNTER — Other Ambulatory Visit: Payer: Self-pay | Admitting: Nurse Practitioner

## 2016-12-04 DIAGNOSIS — M5416 Radiculopathy, lumbar region: Secondary | ICD-10-CM | POA: Diagnosis not present

## 2016-12-04 DIAGNOSIS — M545 Low back pain: Secondary | ICD-10-CM | POA: Diagnosis not present

## 2016-12-04 DIAGNOSIS — M76822 Posterior tibial tendinitis, left leg: Secondary | ICD-10-CM | POA: Diagnosis not present

## 2016-12-04 DIAGNOSIS — M542 Cervicalgia: Secondary | ICD-10-CM | POA: Diagnosis not present

## 2016-12-04 MED FILL — fentaNYL 37.5 MCG/HR PT72: 37.5 | 30 days supply | Qty: 10 | Fill #0

## 2016-12-05 MED FILL — ELIQUIS 5 MG TABLET: 5 | 90 days supply | Qty: 180 | Fill #0

## 2016-12-05 NOTE — Telephone Encounter (Signed)
Pt last seen by 11/17 by Gypsy BalsamAmber Seiler for Dr Johney FrameAllred. Last creatine 10/17 0.90, age [age]yrs, weight 96.8Kg. Pt on appropriate dose, Eliquis 5mg  BID. Will refill Rx.

## 2016-12-06 LAB — CUP PACEART REMOTE DEVICE CHECK
Date Time Interrogation Session: 20180204011236
MDC IDC PG IMPLANT DT: 20151201

## 2016-12-06 NOTE — Progress Notes (Signed)
Carelink summary report received. Battery status OK. Normal device function. No new symptom episodes, tachy episodes, brady, or pause episodes. No new AF episodes. Monthly summary reports and ROV/PRN 

## 2016-12-08 ENCOUNTER — Ambulatory Visit (INDEPENDENT_AMBULATORY_CARE_PROVIDER_SITE_OTHER): Payer: Medicare Other | Admitting: *Deleted

## 2016-12-08 ENCOUNTER — Ambulatory Visit: Payer: Medicare Other | Admitting: *Deleted

## 2016-12-08 DIAGNOSIS — I639 Cerebral infarction, unspecified: Secondary | ICD-10-CM

## 2016-12-09 MED FILL — OMEPRAZOLE DR 20 MG CAPSULE: 20 | 30 days supply | Qty: 30 | Fill #3

## 2016-12-09 MED FILL — traZODone HCL 100 MG TABS: 100 | 30 days supply | Qty: 30 | Fill #2

## 2016-12-09 MED FILL — LYRICA 100 MG CAPSULE: 100 | 30 days supply | Qty: 60 | Fill #1

## 2016-12-09 NOTE — Progress Notes (Signed)
Carelink Summary Report / Loop Recorder 

## 2016-12-19 DIAGNOSIS — M79672 Pain in left foot: Secondary | ICD-10-CM | POA: Diagnosis not present

## 2016-12-25 DIAGNOSIS — F411 Generalized anxiety disorder: Secondary | ICD-10-CM | POA: Diagnosis not present

## 2016-12-25 DIAGNOSIS — Z720 Tobacco use: Secondary | ICD-10-CM | POA: Diagnosis not present

## 2016-12-25 DIAGNOSIS — I480 Paroxysmal atrial fibrillation: Secondary | ICD-10-CM | POA: Diagnosis not present

## 2016-12-25 DIAGNOSIS — E7800 Pure hypercholesterolemia, unspecified: Secondary | ICD-10-CM | POA: Diagnosis not present

## 2016-12-25 DIAGNOSIS — R42 Dizziness and giddiness: Secondary | ICD-10-CM | POA: Diagnosis not present

## 2016-12-25 DIAGNOSIS — Z8659 Personal history of other mental and behavioral disorders: Secondary | ICD-10-CM | POA: Diagnosis not present

## 2016-12-25 DIAGNOSIS — I6340 Cerebral infarction due to embolism of unspecified cerebral artery: Secondary | ICD-10-CM | POA: Diagnosis not present

## 2016-12-25 DIAGNOSIS — I10 Essential (primary) hypertension: Secondary | ICD-10-CM | POA: Diagnosis not present

## 2016-12-25 DIAGNOSIS — G894 Chronic pain syndrome: Secondary | ICD-10-CM | POA: Diagnosis not present

## 2016-12-25 MED FILL — HYDROXYZINE PAM 25 MG CAP: 25 | 30 days supply | Qty: 30 | Fill #1

## 2016-12-25 MED FILL — NORTRIPTYLINE HCL 50 MG CAP: 50 | 30 days supply | Qty: 60 | Fill #2

## 2016-12-25 MED FILL — tiZANidine HCL 4 MG TABS: 4 | 30 days supply | Qty: 90 | Fill #1

## 2016-12-25 MED FILL — GABAPENTIN 800 MG TABLET: 800 | 30 days supply | Qty: 120 | Fill #3

## 2016-12-25 MED FILL — METHOCARBAMOL 750 MG TABLET: 750 | 30 days supply | Qty: 90 | Fill #2

## 2016-12-25 MED FILL — ROSUVASTATIN CALCIUM 40 MG: 40 | 30 days supply | Qty: 30 | Fill #9

## 2016-12-25 MED FILL — SERTRALINE HCL 50 MG TABLET: 50 | 30 days supply | Qty: 60 | Fill #10

## 2017-01-01 ENCOUNTER — Ambulatory Visit: Payer: Medicare Other | Admitting: *Deleted

## 2017-01-01 DIAGNOSIS — M5412 Radiculopathy, cervical region: Secondary | ICD-10-CM | POA: Diagnosis not present

## 2017-01-01 DIAGNOSIS — M542 Cervicalgia: Secondary | ICD-10-CM | POA: Diagnosis not present

## 2017-01-01 DIAGNOSIS — M545 Low back pain: Secondary | ICD-10-CM | POA: Diagnosis not present

## 2017-01-01 MED FILL — fentaNYL 37.5 MCG/HR PT72: 37.5 | 30 days supply | Qty: 10 | Fill #0

## 2017-01-01 NOTE — Progress Notes (Deleted)
Pre visit review using our clinic review tool, if applicable. No additional management support is needed unless otherwise documented below in the visit note. 

## 2017-01-01 NOTE — Progress Notes (Deleted)
Subjective:   Dawn Escobar is a 56 y.o. female who presents for Medicare Annual (Subsequent) preventive examination.  Review of Systems:  No ROS.  Medicare Wellness Visit.     Sleep patterns: {SX; SLEEP PATTERNS:18802::"feels rested on waking","does not get up to void","gets up *** times nightly to void","sleeps *** hours nightly"}.   Home Safety/Smoke Alarms:   Living environment; residence and Firearm Safety: {Rehab home environment / accessibility:30080::"no firearms","firearms stored safely"}. Seat Belt Safety/Bike Helmet: Wears seat belt.   Counseling:   Eye Exam-  Dental-  Female:   Pap- N/A, hysterectomy      Mammo- last 12/27/15. BI-RADS CATEGORY  1: Negative.       Dexa scan- No report on file.         CCS- last 10/06/12. No report on file.       Objective:     Vitals: There were no vitals taken for this visit.  There is no height or weight on file to calculate BMI.   Tobacco History  Smoking Status  . Former Smoker  . Packs/day: 0.20  . Types: Cigarettes  Smokeless Tobacco  . Never Used     Counseling given: Not Answered   Past Medical History:  Diagnosis Date  . Anxiety   . Atrial fibrillation (HCC)   . GERD (gastroesophageal reflux disease) 04/25/2015  . Hereditary and idiopathic peripheral neuropathy 04/25/2015  . Hypercholesteremia   . Hypertension   . Migraine   . PTSD (post-traumatic stress disorder)   . Raynaud's disease   . Stroke (HCC)   . Tobacco abuse    Past Surgical History:  Procedure Laterality Date  . ABDOMINAL HYSTERECTOMY    . HIP SURGERY  Right  . KNEE SURGERY    . 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 TEE WITHOUT CARDIOVERSION N/A 09/05/2014   Procedure: TRANSESOPHAGEAL ECHOCARDIOGRAM (TEE);  Surgeon: Wendall Stade, MD;  Location: [facility] ENDOSCOPY;  Service: Cardiovascular;  Laterality: N/A;  loop after  . WRIST SURGERY     Family History  Problem Relation Age of  Onset  . Heart disease Mother   . Heart attack Father   . Colon cancer Brother    History  Sexual Activity  . Sexual activity: No    Comment: pt. reports that she believes she's going through the change; have no sex drive; hot flashes and excessive sweating for a couple of months now    Outpatient Encounter Prescriptions as of 01/01/2017  Medication Sig  . acetaminophen (TYLENOL) 500 MG tablet Take 500 mg by mouth every 6 (six) hours as needed for mild pain, moderate pain or headache.  . busPIRone (BUSPAR) 15 MG tablet TAKE 1 TABLET (15 MG TOTAL) BY MOUTH 2 (TWO) TIMES DAILY.  . clotrimazole-betamethasone (LOTRISONE) cream Apply 1 application topically 2 (two) times daily.  . diphenhydrAMINE (BENADRYL) 25 mg capsule Take 1 capsule (25 mg total) by mouth every 4 (four) hours as needed for itching.  Marland Kitchen ELIQUIS 5 MG TABS tablet TAKE 1 TABLET (5 MG TOTAL) BY MOUTH 2 TIMES DAILY.  Marland Kitchen FentaNYL 37.5 MCG/HR PT72 Place 37.5 mcg onto the skin every 3 (three) days.   . fluconazole (DIFLUCAN) 150 MG tablet 1 tab po q day for 3 days (Patient not taking: Reported on 08/13/2016)  . fluticasone (FLONASE) 50 MCG/ACT nasal spray Place 2 sprays into both nostrils daily.  Marland Kitchen gabapentin (NEURONTIN) 800 MG tablet Take 800 mg by mouth  4 (four) times daily.   . hydrOXYzine (VISTARIL) 25 MG capsule TAKE 1 CAPSULE BY MOUTH AT NIGHT TIME AS NEEDED  . meclizine (ANTIVERT) 12.5 MG tablet Take 1 tablet (12.5 mg total) by mouth 3 (three) times daily as needed for dizziness.  . meclizine (ANTIVERT) 12.5 MG tablet Take 1 tablet (12.5 mg total) by mouth 3 (three) times daily as needed for dizziness.  . methocarbamol (ROBAXIN) 750 MG tablet Take 750 mg by mouth 2 (two) times daily.  . nortriptyline (PAMELOR) 75 MG capsule Take 75 mg by mouth every evening.  Marland Kitchen omeprazole (PRILOSEC) 20 MG capsule TAKE 1 CAPSULE BY MOUTH DAILY  . rosuvastatin (CRESTOR) 40 MG tablet TAKE 1 TABLET BY MOUTH DAILY (Patient taking differently: TAKE 1  TABLET BY MOUTH DAILY IN THE EVENING)  . sertraline (ZOLOFT) 50 MG tablet TAKE 2 TABLETS (100 MG TOTAL) BY MOUTH DAILY. (Patient taking differently: TAKE 2 TABLETS (100 MG TOTAL) BY MOUTH DAILY IN THE EVENING.)  . tiZANidine (ZANAFLEX) 4 MG tablet Take 4 mg by mouth 2 (two) times daily.   . traZODone (DESYREL) 100 MG tablet TAKE 1 TABLET (100 MG TOTAL) BY MOUTH AT BEDTIME.  . verapamil (CALAN-SR) 120 MG CR tablet TAKE 1 TABLET (120 MG TOTAL) BY MOUTH DAILY.   No facility-administered encounter medications on file as of 01/01/2017.     Activities of Daily Living In your present state of health, do you have any difficulty performing the following activities: 06/27/2016  Hearing? N  Vision? N  Difficulty concentrating or making decisions? N  Walking or climbing stairs? N  Dressing or bathing? N  Doing errands, shopping? N  Some recent data might be hidden    Patient Care Team: Esperanza Richters, PA-C as PCP - General (Physician Assistant) Hillis Range, MD as Consulting Physician (Cardiology) Micki Riley, MD as Consulting Physician (Neurology)    Assessment:    Physical assessment deferred to PCP.  Exercise Activities and Dietary recommendations    Diet (meal preparation, eat out, water intake, caffeinated beverages, dairy products, fruits and vegetables): {Desc; diets:16563} Breakfast: Lunch:  Dinner:      Goals    . Complete Mammogram before the end of the year.   (pt-stated)    . Increase physical activity          Plans to increase physical activity after hip and bilateral leg pain well controlled.        Fall Risk Fall Risk  12/05/2015 12/05/2015 09/13/2015  Falls in the past year? Yes Yes No  Number falls in past yr: - 2 or more -  Injury with Fall? - No -  Risk Factor Category  - High Fall Risk -  Risk for fall due to : - Other (Comment) -  Risk for fall due to (comments): - pt. reports neck & back pain; also in the hip &  groin area, causing bilateral leg weeakness -    Follow up - Follow up appointment -   Depression Screen PHQ 2/9 Scores 12/05/2015  PHQ - 2 Score 6  PHQ- 9 Score 21     Cognitive Function MMSE - Mini Mental State Exam 12/05/2015  Orientation to time 5  Orientation to Place 5  Registration 3  Attention/ Calculation 5  Recall 2  Language- name 2 objects 2  Language- repeat 1  Language- follow 3 step command 3  Language- read & follow direction 1  Write a sentence 1  Copy design 1  Total score 29  Immunization History  Administered Date(s) Administered  . Influenza,inj,Quad PF,36+ Mos 06/03/2015, 06/30/2016  . Influenza-Unspecified 10/06/2013  . Tdap 03/05/2016   Screening Tests Health Maintenance  Topic Date Due  . Hepatitis C Screening  20-Apr-1961  . HIV Screening  01/24/1977  . MAMMOGRAM  12/26/2017  . COLONOSCOPY  10/06/2022  . TETANUS/TDAP  03/05/2026  . INFLUENZA VACCINE  Completed      Plan:    Follow-up w/ PCP as scheduled.  During the course of the visit the patient was educated and counseled about the following appropriate screening and preventive services:   Vaccines to include Pneumococcal, Influenza, Td, HCV  Cardiovascular Disease  Colorectal cancer screening  Bone density screening  Diabetes screening  Glaucoma screening  Mammography  Nutrition counseling   Patient Instructions (the written plan) was given to the patient.   Starla Linkarolyn J Fanchon Papania, RN  01/01/2017

## 2017-01-05 MED FILL — busPIRone HCL 15 MG TABS: 15 | 30 days supply | Qty: 60 | Fill #2

## 2017-01-05 MED FILL — LYRICA 150 MG CAPSULE: 150 | 30 days supply | Qty: 60 | Fill #0

## 2017-01-07 ENCOUNTER — Ambulatory Visit (INDEPENDENT_AMBULATORY_CARE_PROVIDER_SITE_OTHER): Payer: Medicare Other | Admitting: *Deleted

## 2017-01-07 DIAGNOSIS — I639 Cerebral infarction, unspecified: Secondary | ICD-10-CM | POA: Diagnosis not present

## 2017-01-08 MED FILL — traZODone HCL 100 MG TABS: 100 | 30 days supply | Qty: 30 | Fill #0

## 2017-01-08 NOTE — Progress Notes (Signed)
Carelink Summary Report / Loop Recorder 

## 2017-01-09 DIAGNOSIS — M79672 Pain in left foot: Secondary | ICD-10-CM | POA: Diagnosis not present

## 2017-01-09 LAB — CUP PACEART REMOTE DEVICE CHECK
Date Time Interrogation Session: 20180306013608
MDC IDC PG IMPLANT DT: 20151201

## 2017-01-12 MED FILL — OMEPRAZOLE DR 20 MG CAPSULE: 20 | 30 days supply | Qty: 30 | Fill #4

## 2017-01-16 LAB — CUP PACEART REMOTE DEVICE CHECK
Date Time Interrogation Session: 20180405014023
Implantable Pulse Generator Implant Date: 20151201

## 2017-01-19 DIAGNOSIS — R6889 Other general symptoms and signs: Secondary | ICD-10-CM | POA: Diagnosis not present

## 2017-01-19 DIAGNOSIS — M76822 Posterior tibial tendinitis, left leg: Secondary | ICD-10-CM | POA: Diagnosis not present

## 2017-01-28 ENCOUNTER — Other Ambulatory Visit: Payer: Self-pay | Admitting: Nurse Practitioner

## 2017-01-28 MED FILL — VERAPAMIL ER 120 MG TABLET: 120 | 90 days supply | Qty: 90 | Fill #0

## 2017-01-28 MED FILL — NORTRIPTYLINE HCL 50 MG CAP: 50 | 30 days supply | Qty: 60 | Fill #0

## 2017-01-28 MED FILL — METHOCARBAMOL 750 MG TABLET: 750 | 30 days supply | Qty: 90 | Fill #0

## 2017-01-28 MED FILL — ROSUVASTATIN CALCIUM 40 MG: 40 | 30 days supply | Qty: 30 | Fill #10

## 2017-01-28 MED FILL — HYDROXYZINE PAM 25 MG CAP: 25 | 30 days supply | Qty: 30 | Fill #2

## 2017-01-28 MED FILL — SERTRALINE HCL 50 MG TABLET: 50 | 30 days supply | Qty: 60 | Fill #11

## 2017-01-28 MED FILL — tiZANidine HCL 4 MG TABS: 4 | 30 days supply | Qty: 90 | Fill #2

## 2017-01-28 MED FILL — GABAPENTIN 800 MG TABLET: 800 | 30 days supply | Qty: 120 | Fill #4

## 2017-01-29 DIAGNOSIS — M797 Fibromyalgia: Secondary | ICD-10-CM | POA: Diagnosis not present

## 2017-01-29 DIAGNOSIS — M542 Cervicalgia: Secondary | ICD-10-CM | POA: Diagnosis not present

## 2017-01-29 DIAGNOSIS — M545 Low back pain: Secondary | ICD-10-CM | POA: Diagnosis not present

## 2017-01-29 MED FILL — OXYCODONE-ACETAMINOPHEN 10-: 10-325 | 15 days supply | Qty: 30 | Fill #0

## 2017-01-29 MED FILL — fentaNYL 25 MCG/HR PT72: 25 | 6 days supply | Qty: 2 | Fill #0

## 2017-01-30 MED FILL — predniSONE 10 MG TABS: 10 | 7 days supply | Qty: 28 | Fill #0

## 2017-02-02 DIAGNOSIS — Z1211 Encounter for screening for malignant neoplasm of colon: Secondary | ICD-10-CM | POA: Diagnosis not present

## 2017-02-02 DIAGNOSIS — Z1159 Encounter for screening for other viral diseases: Secondary | ICD-10-CM | POA: Diagnosis not present

## 2017-02-02 DIAGNOSIS — I10 Essential (primary) hypertension: Secondary | ICD-10-CM | POA: Diagnosis not present

## 2017-02-02 DIAGNOSIS — Z0000 Encounter for general adult medical examination without abnormal findings: Secondary | ICD-10-CM | POA: Diagnosis not present

## 2017-02-04 MED FILL — fentaNYL 12 MCG/HR PT72: 12 | 6 days supply | Qty: 2 | Fill #0

## 2017-02-06 ENCOUNTER — Encounter: Payer: Medicare Other | Admitting: *Deleted

## 2017-02-06 ENCOUNTER — Ambulatory Visit (INDEPENDENT_AMBULATORY_CARE_PROVIDER_SITE_OTHER): Payer: Medicare Other | Admitting: *Deleted

## 2017-02-06 DIAGNOSIS — I639 Cerebral infarction, unspecified: Secondary | ICD-10-CM | POA: Diagnosis not present

## 2017-02-06 MED FILL — LEVOCETIRIZINE 5 MG TABLET: 5 | 90 days supply | Qty: 90 | Fill #0

## 2017-02-09 DIAGNOSIS — F4322 Adjustment disorder with anxiety: Secondary | ICD-10-CM | POA: Diagnosis not present

## 2017-02-09 MED FILL — traZODone HCL 100 MG TABS: 100 | 30 days supply | Qty: 30 | Fill #0

## 2017-02-09 MED FILL — hydrOXYzine HCL 25 MG TABS: 25 | 30 days supply | Qty: 30 | Fill #0

## 2017-02-09 NOTE — Progress Notes (Signed)
Carelink Summary Report / Loop Recorder 

## 2017-02-10 DIAGNOSIS — I480 Paroxysmal atrial fibrillation: Secondary | ICD-10-CM | POA: Diagnosis not present

## 2017-02-10 DIAGNOSIS — I10 Essential (primary) hypertension: Secondary | ICD-10-CM | POA: Diagnosis not present

## 2017-02-10 DIAGNOSIS — M545 Low back pain: Secondary | ICD-10-CM | POA: Diagnosis not present

## 2017-02-10 DIAGNOSIS — M461 Sacroiliitis, not elsewhere classified: Secondary | ICD-10-CM | POA: Diagnosis not present

## 2017-02-10 DIAGNOSIS — M542 Cervicalgia: Secondary | ICD-10-CM | POA: Diagnosis not present

## 2017-02-10 DIAGNOSIS — M5416 Radiculopathy, lumbar region: Secondary | ICD-10-CM | POA: Diagnosis not present

## 2017-02-10 MED FILL — OXYCODONE-ACETAMINOPHEN 10-: 10-325 | 15 days supply | Qty: 60 | Fill #0

## 2017-02-11 MED FILL — OMEPRAZOLE DR 20 MG CAPSULE: 20 | 30 days supply | Qty: 30 | Fill #5

## 2017-02-11 MED FILL — LYRICA 150 MG CAPSULE: 150 | 30 days supply | Qty: 60 | Fill #1

## 2017-02-19 DIAGNOSIS — Z8659 Personal history of other mental and behavioral disorders: Secondary | ICD-10-CM | POA: Diagnosis not present

## 2017-02-19 DIAGNOSIS — I480 Paroxysmal atrial fibrillation: Secondary | ICD-10-CM | POA: Diagnosis not present

## 2017-02-19 DIAGNOSIS — E7800 Pure hypercholesterolemia, unspecified: Secondary | ICD-10-CM | POA: Diagnosis not present

## 2017-02-19 DIAGNOSIS — Z8673 Personal history of transient ischemic attack (TIA), and cerebral infarction without residual deficits: Secondary | ICD-10-CM | POA: Diagnosis not present

## 2017-02-19 DIAGNOSIS — F411 Generalized anxiety disorder: Secondary | ICD-10-CM | POA: Diagnosis not present

## 2017-02-19 DIAGNOSIS — I10 Essential (primary) hypertension: Secondary | ICD-10-CM | POA: Diagnosis not present

## 2017-02-19 LAB — CUP PACEART REMOTE DEVICE CHECK
Implantable Pulse Generator Implant Date: 20151201
MDC IDC SESS DTM: 20180505020546

## 2017-02-24 DIAGNOSIS — M545 Low back pain: Secondary | ICD-10-CM | POA: Diagnosis not present

## 2017-02-24 DIAGNOSIS — M5416 Radiculopathy, lumbar region: Secondary | ICD-10-CM | POA: Diagnosis not present

## 2017-02-24 DIAGNOSIS — M542 Cervicalgia: Secondary | ICD-10-CM | POA: Diagnosis not present

## 2017-02-24 MED FILL — OXYCODONE-ACETAMINOPHEN 10-: 10-325 | 30 days supply | Qty: 120 | Fill #0

## 2017-02-26 MED FILL — tiZANidine HCL 4 MG TABS: 4 | 12 days supply | Qty: 36 | Fill #0

## 2017-02-26 MED FILL — METHOCARBAMOL 750 MG TABLET: 750 | 12 days supply | Qty: 36 | Fill #1

## 2017-02-26 MED FILL — GABAPENTIN 800 MG TABLET: 800 | 12 days supply | Qty: 48 | Fill #5

## 2017-02-26 MED FILL — ROSUVASTATIN CALCIUM 40 MG: 40 | 12 days supply | Qty: 12 | Fill #11

## 2017-03-06 DIAGNOSIS — Z1231 Encounter for screening mammogram for malignant neoplasm of breast: Secondary | ICD-10-CM | POA: Diagnosis not present

## 2017-03-09 ENCOUNTER — Ambulatory Visit (INDEPENDENT_AMBULATORY_CARE_PROVIDER_SITE_OTHER): Payer: Medicare Other | Admitting: *Deleted

## 2017-03-09 DIAGNOSIS — I639 Cerebral infarction, unspecified: Secondary | ICD-10-CM | POA: Diagnosis not present

## 2017-03-09 NOTE — Progress Notes (Signed)
Carelink Summary Report / Loop Recorder 

## 2017-03-10 ENCOUNTER — Other Ambulatory Visit: Payer: Self-pay | Admitting: Neurology

## 2017-03-10 DIAGNOSIS — I480 Paroxysmal atrial fibrillation: Secondary | ICD-10-CM | POA: Diagnosis not present

## 2017-03-10 MED FILL — tiZANidine HCL 4 MG TABS: 4 | 30 days supply | Qty: 90 | Fill #1

## 2017-03-10 MED FILL — METHOCARBAMOL 750 MG TABLET: 750 | 30 days supply | Qty: 90 | Fill #2

## 2017-03-10 MED FILL — GABAPENTIN 800 MG TABLET: 800 | 30 days supply | Qty: 120 | Fill #0

## 2017-03-11 LAB — CUP PACEART REMOTE DEVICE CHECK
Implantable Pulse Generator Implant Date: 20151201
MDC IDC SESS DTM: 20180604021150

## 2017-03-11 MED FILL — ROSUVASTATIN CALCIUM 40 MG: 40 | 90 days supply | Qty: 90 | Fill #0

## 2017-03-11 MED FILL — traZODone HCL 100 MG TABS: 100 | 29 days supply | Qty: 29 | Fill #1

## 2017-03-11 MED FILL — hydrOXYzine HCL 25 MG TABS: 25 | 29 days supply | Qty: 29 | Fill #1

## 2017-03-13 ENCOUNTER — Other Ambulatory Visit: Payer: Self-pay | Admitting: Medical

## 2017-03-13 MED FILL — OMEPRAZOLE DR 20 MG CAPSULE: 20 | 90 days supply | Qty: 90 | Fill #0

## 2017-03-16 DIAGNOSIS — F4310 Post-traumatic stress disorder, unspecified: Secondary | ICD-10-CM | POA: Diagnosis not present

## 2017-03-16 DIAGNOSIS — Z1231 Encounter for screening mammogram for malignant neoplasm of breast: Secondary | ICD-10-CM | POA: Diagnosis not present

## 2017-03-16 DIAGNOSIS — I10 Essential (primary) hypertension: Secondary | ICD-10-CM | POA: Diagnosis not present

## 2017-03-16 MED FILL — ELIQUIS 5 MG TABLET: 5 | 85 days supply | Qty: 170 | Fill #1

## 2017-03-16 MED FILL — NORTRIPTYLINE HCL 50 MG CAP: 50 | 30 days supply | Qty: 30 | Fill #0

## 2017-03-16 MED FILL — hydrOXYzine HCL 25 MG TABS: 25 | 25 days supply | Qty: 25 | Fill #0

## 2017-03-17 DIAGNOSIS — M5416 Radiculopathy, lumbar region: Secondary | ICD-10-CM | POA: Diagnosis not present

## 2017-03-17 DIAGNOSIS — M545 Low back pain: Secondary | ICD-10-CM | POA: Diagnosis not present

## 2017-03-17 DIAGNOSIS — M542 Cervicalgia: Secondary | ICD-10-CM | POA: Diagnosis not present

## 2017-03-17 MED FILL — CYCLOBENZAPRINE 10 MG TAB: 10 | 30 days supply | Qty: 90 | Fill #0

## 2017-03-25 MED FILL — OXYCODONE-ACETAMINOPHEN 10-: 10-325 | 30 days supply | Qty: 120 | Fill #0

## 2017-03-26 DIAGNOSIS — M5416 Radiculopathy, lumbar region: Secondary | ICD-10-CM | POA: Diagnosis not present

## 2017-03-26 DIAGNOSIS — M545 Low back pain: Secondary | ICD-10-CM | POA: Diagnosis not present

## 2017-03-27 MED FILL — FLUTICASONE PROP 50 MCG SPR: 50 | 60 days supply | Qty: 16 | Fill #0

## 2017-04-01 ENCOUNTER — Telehealth: Payer: Self-pay | Admitting: Cardiology

## 2017-04-01 NOTE — Telephone Encounter (Signed)
LMOVM requesting that pt send manual transmission b/c home monitor has not updated in at least 14 days.    

## 2017-04-07 ENCOUNTER — Ambulatory Visit (INDEPENDENT_AMBULATORY_CARE_PROVIDER_SITE_OTHER): Payer: Medicare Other | Admitting: *Deleted

## 2017-04-07 DIAGNOSIS — I639 Cerebral infarction, unspecified: Secondary | ICD-10-CM

## 2017-04-09 MED FILL — hydrOXYzine HCL 25 MG TABS: 25 | 30 days supply | Qty: 30 | Fill #1

## 2017-04-09 MED FILL — GABAPENTIN 800 MG TABLET: 800 | 30 days supply | Qty: 120 | Fill #1

## 2017-04-09 MED FILL — traZODone HCL 100 MG TABS: 100 | 30 days supply | Qty: 30 | Fill #0

## 2017-04-09 NOTE — Progress Notes (Signed)
Carelink Summary Report / Loop Recorder 

## 2017-04-10 DIAGNOSIS — M76822 Posterior tibial tendinitis, left leg: Secondary | ICD-10-CM | POA: Diagnosis not present

## 2017-04-10 DIAGNOSIS — M79672 Pain in left foot: Secondary | ICD-10-CM | POA: Diagnosis not present

## 2017-04-14 DIAGNOSIS — M542 Cervicalgia: Secondary | ICD-10-CM | POA: Diagnosis not present

## 2017-04-14 DIAGNOSIS — M545 Low back pain: Secondary | ICD-10-CM | POA: Diagnosis not present

## 2017-04-14 MED FILL — NORTRIPTYLINE HCL 75 MG CAP: 75 | 30 days supply | Qty: 30 | Fill #0

## 2017-04-14 MED FILL — NORTRIPTYLINE HCL 50 MG CAP: 50 | 25 days supply | Qty: 25 | Fill #1

## 2017-04-14 MED FILL — CYCLOBENZAPRINE 10 MG TABLE: 10 | 30 days supply | Qty: 90 | Fill #0

## 2017-04-15 ENCOUNTER — Encounter: Payer: Self-pay | Admitting: Cardiology

## 2017-04-16 DIAGNOSIS — M5416 Radiculopathy, lumbar region: Secondary | ICD-10-CM | POA: Diagnosis not present

## 2017-04-16 DIAGNOSIS — M545 Low back pain: Secondary | ICD-10-CM | POA: Diagnosis not present

## 2017-04-16 LAB — CUP PACEART REMOTE DEVICE CHECK
Date Time Interrogation Session: 20180704021524
MDC IDC PG IMPLANT DT: 20151201

## 2017-04-24 MED FILL — OXYCODONE-ACETAMINOPHEN 10-: 10-325 | 30 days supply | Qty: 120 | Fill #0

## 2017-04-27 DIAGNOSIS — I10 Essential (primary) hypertension: Secondary | ICD-10-CM | POA: Diagnosis not present

## 2017-04-27 DIAGNOSIS — F4310 Post-traumatic stress disorder, unspecified: Secondary | ICD-10-CM | POA: Diagnosis not present

## 2017-04-27 MED FILL — VERAPAMIL ER 120 MG TABLET: 120 | 42 days supply | Qty: 42 | Fill #1

## 2017-04-27 MED FILL — PARoxetine HCL 20 MG TABS: 20 | 30 days supply | Qty: 30 | Fill #0

## 2017-04-27 MED FILL — HYDROXYZINE PAM 25 MG CAP: 25 | 30 days supply | Qty: 30 | Fill #0

## 2017-05-06 MED FILL — LEVOCETIRIZINE 5 MG TABLET: 5 | 90 days supply | Qty: 90 | Fill #1

## 2017-05-07 ENCOUNTER — Telehealth: Payer: Self-pay | Admitting: Pharmacist

## 2017-05-07 ENCOUNTER — Encounter: Payer: Medicare Other | Admitting: *Deleted

## 2017-05-07 NOTE — Telephone Encounter (Signed)
Patient with diagnosis of Afib on Eliquis for anticoagulation.    Procedure: colonoscopy Date of procedure: 05/19/17  CHADS2 score of 3 (HTN, stroke/tia x 2)  Per office protocol, patient can hold Eliquis for 1 day prior to procedure.  Resume Eliquis ASAP after procedure due to history of stroke.   Faxed to number provided.

## 2017-05-11 MED FILL — OMEPRAZOLE DR 20 MG CAPSULE: 20 | 29 days supply | Qty: 29 | Fill #0

## 2017-05-11 MED FILL — traZODone HCL 100 MG TABS: 100 | 29 days supply | Qty: 29 | Fill #1

## 2017-05-11 MED FILL — GABAPENTIN 800 MG TABLET: 800 | 29 days supply | Qty: 116 | Fill #2

## 2017-05-12 MED FILL — NORTRIPTYLINE HCL 75 MG CAP: 75 | 30 days supply | Qty: 30 | Fill #1

## 2017-05-12 MED FILL — CYCLOBENZAPRINE 10 MG TABLE: 10 | 30 days supply | Qty: 90 | Fill #0

## 2017-05-12 MED FILL — NORTRIPTYLINE HCL 25 MG CAP: 25 | 14 days supply | Qty: 14 | Fill #0

## 2017-05-28 MED FILL — METHOCARBAMOL 750 MG TABLET: 750 | 18 days supply | Qty: 54 | Fill #3

## 2017-06-09 ENCOUNTER — Other Ambulatory Visit: Payer: Self-pay | Admitting: Nurse Practitioner

## 2017-06-09 ENCOUNTER — Other Ambulatory Visit: Payer: Self-pay | Admitting: Internal Medicine

## 2017-06-09 MED FILL — GABAPENTIN 800 MG TABLET: 800 | 30 days supply | Qty: 120 | Fill #3

## 2017-06-09 MED FILL — traZODone HCL 100 MG TABS: 100 | 30 days supply | Qty: 30 | Fill #0

## 2017-06-09 MED FILL — HYDROXYZINE PAM 25 MG CAP: 25 | 30 days supply | Qty: 30 | Fill #1

## 2017-06-09 MED FILL — PARoxetine HCL 20 MG TABS: 20 | 30 days supply | Qty: 30 | Fill #1

## 2017-06-09 MED FILL — ROSUVASTATIN CALCIUM 40 MG: 40 | 90 days supply | Qty: 90 | Fill #1

## 2017-06-09 MED FILL — OMEPRAZOLE 20 MG CAP: 20 | 30 days supply | Qty: 30 | Fill #1

## 2017-06-10 MED FILL — METHOCARBAMOL 500 MG TABLET: 500 | 30 days supply | Qty: 90 | Fill #0

## 2017-06-11 MED FILL — VERAPAMIL ER 120 MG TABLET: 120 | 30 days supply | Qty: 30 | Fill #0

## 2017-06-11 NOTE — Telephone Encounter (Signed)
Patient should have refills through October. Will refill for one #30.

## 2017-06-15 MED FILL — ELIQUIS 5 MG TABLET: 5 | 30 days supply | Qty: 60 | Fill #0

## 2017-06-16 MED FILL — NORTRIPTYLINE HCL 75 MG CAP: 75 | 30 days supply | Qty: 30 | Fill #0

## 2017-06-24 MED FILL — CYCLOBENZAPRINE 10 MG TAB: 10 | 30 days supply | Qty: 90 | Fill #1

## 2017-07-06 MED FILL — OMEPRAZOLE 20 MG CAP: 20 | 30 days supply | Qty: 30 | Fill #2

## 2017-07-06 MED FILL — GABAPENTIN 800 MG TABS: 800 | 30 days supply | Qty: 120 | Fill #4

## 2017-07-09 MED FILL — hydrOXYzine HCL 25 MG TABS: 25 | 10 days supply | Qty: 10 | Fill #2

## 2017-07-09 MED FILL — METHOCARBAMOL 500 MG TABLET: 500 | 30 days supply | Qty: 90 | Fill #1

## 2017-07-09 MED FILL — VERAPAMIL ER 120 MG TABLET: 120 | 30 days supply | Qty: 30 | Fill #1

## 2017-07-09 MED FILL — traZODone HCL 100 MG TABS: 100 | 30 days supply | Qty: 30 | Fill #1

## 2017-07-09 MED FILL — FLUTICASONE PROP 50 MCG SPR: 50 | 60 days supply | Qty: 16 | Fill #1

## 2017-07-13 MED FILL — PARoxetine HCL 20 MG TABS: 20 | 30 days supply | Qty: 30 | Fill #0

## 2017-07-15 MED FILL — NORTRIPTYLINE HCL 75 MG CAP: 75 | 30 days supply | Qty: 30 | Fill #1

## 2017-07-15 MED FILL — HYDROXYZINE PAM 25 MG CAP: 25 | 30 days supply | Qty: 30 | Fill #0

## 2017-07-15 MED FILL — ELIQUIS 5 MG TABLET: 5 | 30 days supply | Qty: 60 | Fill #1

## 2017-08-04 MED FILL — CYCLOBENZAPRINE 10 MG TAB: 10 | 30 days supply | Qty: 90 | Fill #2

## 2017-08-05 MED FILL — GABAPENTIN 800 MG TABS: 800 | 30 days supply | Qty: 120 | Fill #5

## 2017-08-05 MED FILL — OMEPRAZOLE 20 MG CAP: 20 | 30 days supply | Qty: 30 | Fill #3

## 2017-08-14 MED FILL — ELIQUIS 5 MG TABLET: 5 | 30 days supply | Qty: 60 | Fill #2

## 2017-08-24 MED FILL — METHOCARBAMOL 500 MG TABLET: 500 | 30 days supply | Qty: 90 | Fill #2

## 2017-08-24 MED FILL — VERAPAMIL ER 120 MG TABLET: 120 | 30 days supply | Qty: 30 | Fill #2

## 2017-08-25 MED FILL — tiZANidine HCL 4 MG TABS: 4 | 30 days supply | Qty: 90 | Fill #2

## 2017-08-26 DIAGNOSIS — Z79899 Other long term (current) drug therapy: Secondary | ICD-10-CM | POA: Diagnosis not present

## 2017-08-26 DIAGNOSIS — G894 Chronic pain syndrome: Secondary | ICD-10-CM | POA: Diagnosis not present

## 2017-08-26 DIAGNOSIS — I10 Essential (primary) hypertension: Secondary | ICD-10-CM | POA: Diagnosis not present

## 2017-08-26 DIAGNOSIS — J0191 Acute recurrent sinusitis, unspecified: Secondary | ICD-10-CM | POA: Diagnosis not present

## 2017-08-26 DIAGNOSIS — Z8659 Personal history of other mental and behavioral disorders: Secondary | ICD-10-CM | POA: Diagnosis not present

## 2017-08-26 DIAGNOSIS — E7800 Pure hypercholesterolemia, unspecified: Secondary | ICD-10-CM | POA: Diagnosis not present

## 2017-08-26 DIAGNOSIS — Z1159 Encounter for screening for other viral diseases: Secondary | ICD-10-CM | POA: Diagnosis not present

## 2017-08-26 DIAGNOSIS — F411 Generalized anxiety disorder: Secondary | ICD-10-CM | POA: Diagnosis not present

## 2017-08-26 MED FILL — NORTRIPTYLINE HCL 25 MG CAP: 25 | 90 days supply | Qty: 90 | Fill #0

## 2017-08-26 MED FILL — traZODone HCL 100 MG TABS: 100 | 90 days supply | Qty: 90 | Fill #0

## 2017-08-26 MED FILL — ROSUVASTATIN CALCIUM 40 MG: 40 | 90 days supply | Qty: 90 | Fill #0

## 2017-08-26 MED FILL — hydrOXYzine HCL 25 MG TABS: 25 | 15 days supply | Qty: 90 | Fill #0

## 2017-08-26 MED FILL — PARoxetine HCL 20 MG TABS: 20 | 90 days supply | Qty: 90 | Fill #0

## 2017-08-26 MED FILL — DOXYCYCLINE HYCLATE 100 MG: 100 | 10 days supply | Qty: 20 | Fill #0

## 2017-08-28 MED FILL — GABAPENTIN 800 MG TABS: 800 | 30 days supply | Qty: 90 | Fill #0

## 2017-09-04 MED FILL — OMEPRAZOLE 20 MG CAP: 20 | 30 days supply | Qty: 30 | Fill #4

## 2017-09-07 DIAGNOSIS — M25572 Pain in left ankle and joints of left foot: Secondary | ICD-10-CM | POA: Diagnosis not present

## 2017-09-07 DIAGNOSIS — M79672 Pain in left foot: Secondary | ICD-10-CM | POA: Diagnosis not present

## 2017-09-08 DIAGNOSIS — I480 Paroxysmal atrial fibrillation: Secondary | ICD-10-CM | POA: Diagnosis not present

## 2017-09-08 DIAGNOSIS — I10 Essential (primary) hypertension: Secondary | ICD-10-CM | POA: Diagnosis not present

## 2017-09-10 DIAGNOSIS — M545 Low back pain: Secondary | ICD-10-CM | POA: Diagnosis not present

## 2017-09-10 DIAGNOSIS — M542 Cervicalgia: Secondary | ICD-10-CM | POA: Diagnosis not present

## 2017-09-10 MED FILL — NORTRIPTYLINE HCL 75 MG CAP: 75 | 30 days supply | Qty: 30 | Fill #0

## 2017-09-10 MED FILL — CYCLOBENZAPRINE 10 MG TABLE: 10 | 30 days supply | Qty: 90 | Fill #0

## 2017-09-11 MED FILL — ELIQUIS 5 MG TABLET: 5 | 30 days supply | Qty: 60 | Fill #3

## 2017-09-11 MED FILL — oxyCODONE HCL 5 MG TABS: 5 | 20 days supply | Qty: 120 | Fill #0

## 2017-09-15 ENCOUNTER — Encounter: Payer: Self-pay | Admitting: Occupational Therapy

## 2017-09-15 NOTE — Therapy (Signed)
[facility] [address] [place], [place], [address] Phone: [phone]   Fax:  [phone]  Patient Details  Name: Dawn Escobar MRN: [id number] Date of Birth: 1961/07/19 Referring Provider:  No ref. provider found  Encounter Date: 09/15/2017  OCCUPATIONAL THERAPY DISCHARGE SUMMARY  Visits from Start of Care: 1  Current functional level related to goals / functional outcomes: Pt did not achieve goals as they did not return following last visit  Remaining deficits: See eval   Education / Equipment: Education was not completed as pt did not return.  Plan: Patient agrees to discharge.  Patient goals were partially met. Patient is being discharged due to not returning since the last visit.  ?????     RINE,KATHRYN 09/15/2017, 10:41 AM  [facility] [address] [place], [place], [address] Phone: [phone]   Fax:  [phone]

## 2017-09-21 DIAGNOSIS — M5125 Other intervertebral disc displacement, thoracolumbar region: Secondary | ICD-10-CM | POA: Diagnosis not present

## 2017-09-21 DIAGNOSIS — M545 Low back pain: Secondary | ICD-10-CM | POA: Diagnosis not present

## 2017-09-21 DIAGNOSIS — M65872 Other synovitis and tenosynovitis, left ankle and foot: Secondary | ICD-10-CM | POA: Diagnosis not present

## 2017-09-21 DIAGNOSIS — M4722 Other spondylosis with radiculopathy, cervical region: Secondary | ICD-10-CM | POA: Diagnosis not present

## 2017-09-21 DIAGNOSIS — M25572 Pain in left ankle and joints of left foot: Secondary | ICD-10-CM | POA: Diagnosis not present

## 2017-09-21 DIAGNOSIS — M722 Plantar fascial fibromatosis: Secondary | ICD-10-CM | POA: Diagnosis not present

## 2017-09-21 DIAGNOSIS — R600 Localized edema: Secondary | ICD-10-CM | POA: Diagnosis not present

## 2017-09-21 DIAGNOSIS — M50121 Cervical disc disorder at C4-C5 level with radiculopathy: Secondary | ICD-10-CM | POA: Diagnosis not present

## 2017-09-21 DIAGNOSIS — M47897 Other spondylosis, lumbosacral region: Secondary | ICD-10-CM | POA: Diagnosis not present

## 2017-09-21 DIAGNOSIS — M5011 Cervical disc disorder with radiculopathy,  high cervical region: Secondary | ICD-10-CM | POA: Diagnosis not present

## 2017-09-21 DIAGNOSIS — M7732 Calcaneal spur, left foot: Secondary | ICD-10-CM | POA: Diagnosis not present

## 2017-09-21 DIAGNOSIS — M47896 Other spondylosis, lumbar region: Secondary | ICD-10-CM | POA: Diagnosis not present

## 2017-09-21 DIAGNOSIS — M50122 Cervical disc disorder at C5-C6 level with radiculopathy: Secondary | ICD-10-CM | POA: Diagnosis not present

## 2017-09-21 DIAGNOSIS — M542 Cervicalgia: Secondary | ICD-10-CM | POA: Diagnosis not present

## 2017-09-21 DIAGNOSIS — M5136 Other intervertebral disc degeneration, lumbar region: Secondary | ICD-10-CM | POA: Diagnosis not present

## 2017-09-25 MED FILL — VERAPAMIL ER 120 MG TABLET: 120 | 30 days supply | Qty: 30 | Fill #3

## 2017-10-05 MED FILL — tiZANidine HCL 4 MG TABS: 4 | 30 days supply | Qty: 90 | Fill #3

## 2017-10-05 MED FILL — OMEPRAZOLE 20 MG CAP: 20 | 30 days supply | Qty: 30 | Fill #5

## 2017-10-05 MED FILL — NORTRIPTYLINE HCL 75 MG CAP: 75 | 30 days supply | Qty: 30 | Fill #1

## 2017-10-19 MED FILL — ELIQUIS 5 MG TABLET: 5 | 30 days supply | Qty: 60 | Fill #4

## 2017-10-22 MED FILL — OXYCODONE-APAP 10-325 MG TA: 10-325 | 30 days supply | Qty: 120 | Fill #0

## 2017-10-27 MED FILL — METHOCARBAMOL 500 MG TABLET: 500 | 30 days supply | Qty: 90 | Fill #3

## 2017-11-02 MED FILL — VERAPAMIL ER 120 MG TABLET: 120 | 30 days supply | Qty: 30 | Fill #4

## 2017-11-03 ENCOUNTER — Telehealth: Payer: Self-pay | Admitting: Medical

## 2017-11-04 ENCOUNTER — Ambulatory Visit (INDEPENDENT_AMBULATORY_CARE_PROVIDER_SITE_OTHER): Payer: Medicare Other | Admitting: *Deleted

## 2017-11-04 DIAGNOSIS — I639 Cerebral infarction, unspecified: Secondary | ICD-10-CM | POA: Diagnosis not present

## 2017-11-04 NOTE — Telephone Encounter (Signed)
Called pt and left her message to call us back so when can set up an appt for medication follow up.

## 2017-11-04 NOTE — Progress Notes (Signed)
Carelink Summary Report / Loop Recorder 

## 2017-11-04 NOTE — Telephone Encounter (Signed)
Pt is due for follow up please call and schedule appointment.  

## 2017-11-06 MED FILL — OMEPRAZOLE 20 MG CAP: 20 | 30 days supply | Qty: 30 | Fill #0

## 2017-11-10 MED FILL — NORTRIPTYLINE HCL 75 MG CAP: 75 | 30 days supply | Qty: 30 | Fill #2

## 2017-11-10 MED FILL — hydrOXYzine HCL 25 MG TABS: 25 | 15 days supply | Qty: 90 | Fill #0

## 2017-11-10 MED FILL — NORTRIPTYLINE HCL 25 MG CAP: 25 | 90 days supply | Qty: 90 | Fill #1

## 2017-11-10 MED FILL — CYCLOBENZAPRINE HCL 10 MG T: 10 | 30 days supply | Qty: 90 | Fill #1

## 2017-11-17 LAB — CUP PACEART REMOTE DEVICE CHECK
Implantable Pulse Generator Implant Date: 20151201
MDC IDC SESS DTM: 20190130131144

## 2017-11-17 MED FILL — GABAPENTIN 800 MG TABLET: 800 | 30 days supply | Qty: 120 | Fill #0

## 2017-11-18 MED FILL — ELIQUIS 5 MG TABLET: 5 | 30 days supply | Qty: 60 | Fill #5

## 2017-11-18 MED FILL — OSELTAMIVIR PHOSPHATE 75 MG: 75 | 5 days supply | Qty: 10 | Fill #0

## 2017-11-20 MED FILL — OXYCODONE-APAP 10-325: 10-325 | 30 days supply | Qty: 120 | Fill #0

## 2017-11-23 MED FILL — PARoxetine HCL 20 MG TABS: 20 | 90 days supply | Qty: 90 | Fill #0

## 2017-11-23 MED FILL — traZODone HCL 100 MG TABS: 100 | 90 days supply | Qty: 90 | Fill #1

## 2017-11-26 MED FILL — GABAPENTIN 600 MG TABS: 600 | 30 days supply | Qty: 90 | Fill #0

## 2017-11-30 MED FILL — OMEPRAZOLE 20 MG CAP: 20 | 90 days supply | Qty: 90 | Fill #0

## 2017-12-01 MED FILL — ROSUVASTATIN CALCIUM 40 MG: 40 | 90 days supply | Qty: 90 | Fill #1

## 2017-12-07 ENCOUNTER — Ambulatory Visit (INDEPENDENT_AMBULATORY_CARE_PROVIDER_SITE_OTHER): Payer: Medicare Other | Admitting: *Deleted

## 2017-12-07 DIAGNOSIS — I639 Cerebral infarction, unspecified: Secondary | ICD-10-CM

## 2017-12-07 NOTE — Progress Notes (Signed)
Carelink Summary Report / Loop Recorder 

## 2017-12-09 ENCOUNTER — Telehealth: Payer: Self-pay | Admitting: Cardiology

## 2017-12-09 MED FILL — VERAPAMIL ER 120 MG TABLET: 120 | 30 days supply | Qty: 30 | Fill #5

## 2017-12-09 MED FILL — NORTRIPTYLINE HCL 75 MG CAP: 75 | 30 days supply | Qty: 30 | Fill #2

## 2017-12-09 NOTE — Telephone Encounter (Signed)
LMOVM requesting that pt send manual transmission b/c home monitor has not updated in at least 14 days.    

## 2017-12-10 ENCOUNTER — Telehealth: Payer: Self-pay | Admitting: Cardiology

## 2017-12-10 MED FILL — tiZANidine HCL 4 MG TABS: 4 | 30 days supply | Qty: 90 | Fill #0

## 2017-12-10 NOTE — Telephone Encounter (Signed)
Patient called and stated that she has unplugged her home monitor. She stated that [facility] and [facility]. After consulting w/ Device Tech RN I un enrolled pt from carelink.

## 2017-12-17 MED FILL — OXYCODONE-APAP 10-325: 10-325 | 30 days supply | Qty: 120 | Fill #0

## 2017-12-17 MED FILL — SUPREP BOWEL PREP KIT: 17.5-3.13-1 | 1 days supply | Qty: 354 | Fill #0

## 2017-12-18 MED FILL — ELIQUIS 5 MG TABLET: 5 | 90 days supply | Qty: 180 | Fill #0

## 2017-12-24 MED FILL — GABAPENTIN 600 MG TAB: 600 | 30 days supply | Qty: 90 | Fill #1

## 2017-12-24 MED FILL — CYCLOBENZAPRINE HCL 10 MG T: 10 | 30 days supply | Qty: 90 | Fill #2

## 2018-01-06 MED FILL — MELOXICAM 15 MG TABLET: 15 | 30 days supply | Qty: 30 | Fill #0

## 2018-01-11 ENCOUNTER — Encounter: Payer: Self-pay | Admitting: *Deleted

## 2018-01-11 MED FILL — VERAPAMIL ER 120 MG TABLET: 120 | 90 days supply | Qty: 90 | Fill #0

## 2018-01-13 MED FILL — FLUTICASONE PROP 50 MCG SPR: 50 | 60 days supply | Qty: 16 | Fill #2

## 2018-01-15 MED FILL — OXYCODONE-APAP 10-325 MG TA: 10-325 | 30 days supply | Qty: 120 | Fill #0

## 2018-01-15 MED FILL — NORTRIPTYLINE HCL 75 MG CAP: 75 | 90 days supply | Qty: 90 | Fill #0

## 2018-01-19 LAB — CUP PACEART REMOTE DEVICE CHECK
Date Time Interrogation Session: 20190304174035
Implantable Pulse Generator Implant Date: 20151201

## 2018-01-21 MED FILL — AZELASTINE HCL 137 MCG/SPRA: 137 | 50 days supply | Qty: 30 | Fill #0

## 2018-01-21 MED FILL — LEVOCETIRIZINE 5 MG TABLET: 5 | 90 days supply | Qty: 90 | Fill #0

## 2018-01-25 MED FILL — tiZANidine HCL 4 MG TABS: 4 | 30 days supply | Qty: 90 | Fill #1

## 2018-02-03 MED FILL — NORTRIPTYLINE HCL 50 MG CAP: 50 | 90 days supply | Qty: 90 | Fill #0

## 2018-02-15 MED FILL — OXYCODONE-APAP 10-325: 10-325 | 30 days supply | Qty: 120 | Fill #0

## 2018-02-15 MED FILL — CYCLOBENZAPRINE HCL 10 MG T: 10 | 30 days supply | Qty: 90 | Fill #0

## 2018-02-15 MED FILL — hydrOXYzine HCL 25 MG TABS: 25 | 15 days supply | Qty: 90 | Fill #0

## 2018-02-16 MED FILL — MELOXICAM 15 MG TABLET: 15 | 30 days supply | Qty: 30 | Fill #0

## 2018-02-24 MED FILL — ELIQUIS 5 MG TABLET: 5 | 30 days supply | Qty: 60 | Fill #0

## 2018-02-24 MED FILL — NICOTINE 14 MG/24HR PATCH: 14 | 28 days supply | Qty: 28 | Fill #0

## 2018-02-24 MED FILL — OMEPRAZOLE 20 MG CAP: 20 | 90 days supply | Qty: 90 | Fill #0

## 2018-02-24 MED FILL — PARoxetine HCL 20 MG TABS: 20 | 90 days supply | Qty: 90 | Fill #0

## 2018-02-24 MED FILL — ROSUVASTATIN CALCIUM 40 MG: 40 | 90 days supply | Qty: 90 | Fill #0

## 2018-02-24 MED FILL — GABAPENTIN 600 MG TABS: 600 | 30 days supply | Qty: 90 | Fill #0

## 2018-03-08 MED FILL — hydrOXYzine HCL 25 MG TABS: 25 | 15 days supply | Qty: 90 | Fill #0

## 2018-03-08 MED FILL — tiZANidine HCL 4 MG TABS: 4 | 30 days supply | Qty: 90 | Fill #2

## 2018-03-08 MED FILL — AZELASTINE HCL 137 MCG/SPRA: 137 | 50 days supply | Qty: 30 | Fill #1

## 2018-03-24 MED FILL — OXYCODONE-APAP 10-325: 10-325 | 30 days supply | Qty: 120 | Fill #0

## 2018-03-24 MED FILL — CYCLOBENZAPRINE HCL 10 MG T: 10 | 30 days supply | Qty: 90 | Fill #1

## 2018-03-24 MED FILL — MELOXICAM 15 MG TABLET: 15 | 30 days supply | Qty: 30 | Fill #0

## 2018-04-01 MED FILL — predniSONE 20 MG TABS: 20 | 3 days supply | Qty: 6 | Fill #0

## 2018-04-01 MED FILL — AMOX-CLAV 875-125 MG TABLET: 875-125 | 7 days supply | Qty: 14 | Fill #0

## 2018-04-07 MED FILL — GABAPENTIN 600 MG TABLET: 600 | 30 days supply | Qty: 90 | Fill #1

## 2018-04-13 MED FILL — VERAPAMIL ER 120 MG TABLET: 120 | 90 days supply | Qty: 90 | Fill #1

## 2018-04-15 MED FILL — NORTRIPTYLINE HCL 75 MG CAP: 75 | 90 days supply | Qty: 90 | Fill #0

## 2018-04-15 MED FILL — LEVOCETIRIZINE 5 MG TABLET: 5 | 90 days supply | Qty: 90 | Fill #1

## 2018-04-22 MED FILL — AZITHROMYCIN 250 MG TABLET: 250 | 5 days supply | Qty: 6 | Fill #0

## 2018-04-23 MED FILL — OXYCODONE-APAP 10-325: 10-325 | 30 days supply | Qty: 120 | Fill #0

## 2018-04-23 MED FILL — MELOXICAM 15 MG TABLET: 15 | 30 days supply | Qty: 30 | Fill #0

## 2018-04-27 MED FILL — ELIQUIS 5 MG TABLET: 5 | 30 days supply | Qty: 60 | Fill #1

## 2018-04-27 MED FILL — CYCLOBENZAPRINE HCL 10 MG T: 10 | 30 days supply | Qty: 90 | Fill #2

## 2018-04-27 MED FILL — tiZANidine HCL 4 MG TABS: 4 | 30 days supply | Qty: 90 | Fill #0

## 2018-05-10 MED FILL — NORTRIPTYLINE HCL 50 MG CAP: 50 | 90 days supply | Qty: 90 | Fill #1

## 2018-05-11 MED FILL — GABAPENTIN 600 MG TABS: 600 | 30 days supply | Qty: 90 | Fill #0

## 2018-05-24 MED FILL — PARoxetine HCL 20 MG TABS: 20 | 90 days supply | Qty: 90 | Fill #1

## 2018-05-26 MED FILL — OXYCODONE-APAP 10-325: 10-325 | 30 days supply | Qty: 90 | Fill #0

## 2018-05-27 MED FILL — ELIQUIS 5 MG TABLET: 5 | 30 days supply | Qty: 60 | Fill #2

## 2018-06-08 MED FILL — ROSUVASTATIN CALCIUM 40 MG: 40 | 90 days supply | Qty: 90 | Fill #1

## 2018-06-08 MED FILL — GABAPENTIN 600 MG TABS: 600 | 30 days supply | Qty: 90 | Fill #1

## 2018-06-08 MED FILL — tiZANidine HCL 4 MG TABS: 4 | 30 days supply | Qty: 90 | Fill #1

## 2018-06-21 MED FILL — CYCLOBENZAPRINE 5 MG TABLET: 5 | 10 days supply | Qty: 30 | Fill #0

## 2018-06-25 MED FILL — OMEPRAZOLE 20 MG CPDR: 20 | 90 days supply | Qty: 90 | Fill #1

## 2018-06-25 MED FILL — ELIQUIS 5 MG TABLET: 5 | 30 days supply | Qty: 60 | Fill #3

## 2018-06-25 MED FILL — OXYCODONE-APAP 10-325: 10-325 | 30 days supply | Qty: 90 | Fill #0

## 2018-06-29 MED FILL — CYCLOBENZAPRINE HCL 10 MG T: 10 | 30 days supply | Qty: 90 | Fill #0

## 2018-06-29 MED FILL — NORTRIPTYLINE HCL 75 MG CAP: 75 | 90 days supply | Qty: 90 | Fill #1

## 2018-07-06 MED FILL — tiZANidine HCL 4 MG TABS: 4 | 30 days supply | Qty: 90 | Fill #2

## 2018-07-08 MED FILL — LEVOCETIRIZINE 5 MG TABLET: 5 | 90 days supply | Qty: 90 | Fill #0

## 2018-07-09 MED FILL — VERAPAMIL 120 MG TABLET: 120 | 90 days supply | Qty: 90 | Fill #0

## 2018-07-13 MED FILL — GABAPENTIN 600 MG TABS: 600 | 30 days supply | Qty: 90 | Fill #0

## 2018-07-21 MED FILL — hydrOXYzine HCL 25 MG TABS: 25 | 15 days supply | Qty: 90 | Fill #1

## 2018-07-23 MED FILL — ELIQUIS 5 MG TABLET: 5 | 30 days supply | Qty: 60 | Fill #4

## 2018-07-30 MED FILL — OXYCODONE-APAP 10-325: 10-325 | 30 days supply | Qty: 90 | Fill #0

## 2018-08-09 MED FILL — CYCLOBENZAPRINE HCL 10 MG T: 10 | 30 days supply | Qty: 90 | Fill #1

## 2018-08-09 MED FILL — GABAPENTIN 600 MG TABS: 600 | 30 days supply | Qty: 90 | Fill #1

## 2018-08-19 MED FILL — PARoxetine HCL 20 MG TABS: 20 | 90 days supply | Qty: 90 | Fill #0

## 2018-08-27 MED FILL — OXYCODONE-APAP 10-325: 10-325 | 8 days supply | Qty: 30 | Fill #0

## 2018-08-27 MED FILL — ELIQUIS 5 MG TABLET: 5 | 30 days supply | Qty: 60 | Fill #5

## 2018-08-27 MED FILL — ROSUVASTATIN CALCIUM 40 MG: 40 | 90 days supply | Qty: 90 | Fill #0

## 2018-09-08 MED FILL — tiZANidine HCL 4 MG TABS: 4 | 30 days supply | Qty: 90 | Fill #0

## 2018-09-13 MED FILL — GABAPENTIN 600 MG TABS: 600 | 30 days supply | Qty: 90 | Fill #0

## 2018-09-15 MED FILL — CYCLOBENZAPRINE HCL 10 MG T: 10 | 30 days supply | Qty: 90 | Fill #0

## 2018-09-27 MED FILL — OXYCODONE-APAP 10-325: 10-325 | 30 days supply | Qty: 90 | Fill #0

## 2018-10-01 MED FILL — NORTRIPTYLINE HCL 75 MG CAP: 75 | 90 days supply | Qty: 90 | Fill #0

## 2018-10-01 MED FILL — OMEPRAZOLE 20 MG CPDR: 20 | 90 days supply | Qty: 90 | Fill #1

## 2018-10-01 MED FILL — ELIQUIS 5 MG TABLET: 5 | 30 days supply | Qty: 60 | Fill #0

## 2018-10-07 MED FILL — VERAPAMIL 120 MG TABLET: 120 | 90 days supply | Qty: 90 | Fill #1

## 2018-10-11 MED FILL — GABAPENTIN 600 MG TABS: 600 | 30 days supply | Qty: 90 | Fill #1

## 2018-10-11 MED FILL — LEVOCETIRIZINE 5 MG TABLET: 5 | 90 days supply | Qty: 90 | Fill #1

## 2018-10-18 MED FILL — SM MUCUS RELIEF 600 MG TB12: 600 | 20 days supply | Qty: 40 | Fill #0

## 2018-10-21 MED FILL — hydrOXYzine HCL 25 MG TABS: 25 | 15 days supply | Qty: 90 | Fill #0

## 2018-10-25 MED FILL — tiZANidine HCL 4 MG TABS: 4 | 30 days supply | Qty: 90 | Fill #1

## 2018-10-27 MED FILL — CYCLOBENZAPRINE HCL 10 MG T: 10 | 30 days supply | Qty: 90 | Fill #1

## 2018-10-27 MED FILL — ELIQUIS 5 MG TABLET: 5 | 30 days supply | Qty: 60 | Fill #1

## 2018-10-28 MED FILL — OXYCODONE-APAP 10-325: 10-325 | 30 days supply | Qty: 90 | Fill #0

## 2018-11-04 ENCOUNTER — Telehealth: Payer: Self-pay

## 2018-11-04 NOTE — Telephone Encounter (Signed)
Took myself off as her pcp as she is seeing family practice in [place] and about 3 years since I last saw her.

## 2018-11-04 NOTE — Telephone Encounter (Signed)
Looks like pt has been seeing Barry Brunner with [facility] last appointment 11/26/17

## 2018-11-12 MED FILL — GABAPENTIN 600 MG TABS: 600 | 30 days supply | Qty: 90 | Fill #0

## 2018-11-17 MED FILL — FUROSEMIDE 20 MG TAB: 20 | 30 days supply | Qty: 30 | Fill #0

## 2018-11-22 MED FILL — PARoxetine HCL 20 MG TABS: 20 | 90 days supply | Qty: 90 | Fill #1

## 2018-11-29 MED FILL — ELIQUIS 5 MG TABLET: 5 | 30 days supply | Qty: 60 | Fill #2

## 2018-11-30 MED FILL — ROSUVASTATIN CALCIUM 40 MG: 40 | 90 days supply | Qty: 90 | Fill #1

## 2018-11-30 MED FILL — FERROUS SULFATE 325 MG TAB: 325 (65 FE) | 100 days supply | Qty: 100 | Fill #0

## 2018-12-03 MED FILL — FUROSEMIDE 20 MG TAB: 20 | 90 days supply | Qty: 90 | Fill #0

## 2018-12-06 MED FILL — MORPHINE SULFATE IR 15 MG T: 15 | 30 days supply | Qty: 90 | Fill #0

## 2018-12-09 MED FILL — METHYLPREDNISOLONE 4 MG TBP: 4 | 6 days supply | Qty: 21 | Fill #0

## 2018-12-13 MED FILL — GABAPENTIN 600 MG TABS: 600 | 30 days supply | Qty: 90 | Fill #1

## 2018-12-15 MED FILL — CYCLOBENZAPRINE HCL 10 MG T: 10 | 30 days supply | Qty: 90 | Fill #0

## 2018-12-15 MED FILL — TAC CRM/HYDROPHOR OINT 3:1: 30 days supply | Qty: 480 | Fill #0

## 2018-12-21 MED FILL — OMEPRAZOLE 20 MG CPDR: 20 | 90 days supply | Qty: 90 | Fill #0

## 2018-12-27 MED FILL — ELIQUIS 5 MG TABLET: 5 | 30 days supply | Qty: 60 | Fill #3

## 2018-12-27 MED FILL — LEVOCETIRIZINE 5 MG TABLET: 5 | 90 days supply | Qty: 90 | Fill #0

## 2018-12-27 MED FILL — NORTRIPTYLINE HCL 75 MG CAP: 75 | 90 days supply | Qty: 90 | Fill #1

## 2019-01-03 ENCOUNTER — Encounter: Payer: Self-pay | Admitting: Surgery

## 2019-01-03 ENCOUNTER — Encounter (HOSPITAL_COMMUNITY): Payer: Self-pay

## 2019-01-05 MED FILL — MORPHINE SULFATE IR 15 MG T: 15 | 30 days supply | Qty: 90 | Fill #0

## 2019-01-07 MED FILL — VERAPAMIL HCL 120 MG TABS: 120 | 90 days supply | Qty: 90 | Fill #2

## 2019-01-12 ENCOUNTER — Telehealth: Payer: Self-pay | Admitting: Nurse Practitioner

## 2019-01-12 MED FILL — GABAPENTIN 600 MG TAB: 600 | 30 days supply | Qty: 90 | Fill #0

## 2019-01-12 MED FILL — hydrOXYzine HCL 25 MG TABS: 25 | 15 days supply | Qty: 90 | Fill #1

## 2019-01-12 NOTE — Telephone Encounter (Signed)
New messages:    Patient nurse from PCP family name Gabriel Rung calling because this patient has some swelling in her legs. Patient has not seen Amber since 2017. She needs a appt. Gabriel Rung would like for you to call her back.

## 2019-01-13 ENCOUNTER — Encounter: Payer: Self-pay | Admitting: Cardiology

## 2019-01-13 ENCOUNTER — Other Ambulatory Visit: Payer: Self-pay

## 2019-01-13 ENCOUNTER — Ambulatory Visit (INDEPENDENT_AMBULATORY_CARE_PROVIDER_SITE_OTHER): Payer: Medicare Other | Admitting: Cardiology

## 2019-01-13 VITALS — BP 132/43 | Ht 65.0 in | Wt 224.0 lb

## 2019-01-13 DIAGNOSIS — I480 Paroxysmal atrial fibrillation: Secondary | ICD-10-CM

## 2019-01-13 DIAGNOSIS — I63032 Cerebral infarction due to thrombosis of left carotid artery: Secondary | ICD-10-CM

## 2019-01-13 DIAGNOSIS — I10 Essential (primary) hypertension: Secondary | ICD-10-CM

## 2019-01-13 DIAGNOSIS — R600 Localized edema: Secondary | ICD-10-CM

## 2019-01-13 DIAGNOSIS — I7300 Raynaud's syndrome without gangrene: Secondary | ICD-10-CM

## 2019-01-13 DIAGNOSIS — R0609 Other forms of dyspnea: Secondary | ICD-10-CM

## 2019-01-13 DIAGNOSIS — F17200 Nicotine dependence, unspecified, uncomplicated: Secondary | ICD-10-CM

## 2019-01-13 MED ORDER — SPIRONOLACTONE 25 MG PO TABS: 25.0000 mg | ORAL_TABLET | Freq: Every day | ORAL | 2 refills | Status: DC

## 2019-01-13 MED ORDER — FUROSEMIDE 40 MG PO TABS: 40.0000 mg | ORAL_TABLET | Freq: Every day | ORAL | 1 refills | Status: DC | PRN

## 2019-01-13 MED FILL — FUROSEMIDE 40 MG TAB: 40 | 30 days supply | Qty: 30 | Fill #0

## 2019-01-13 MED FILL — SPIRONOLACTONE 25 MG TABLET: 25 | 30 days supply | Qty: 30 | Fill #0

## 2019-01-13 NOTE — Progress Notes (Signed)
Virtual Visit via Video Note: This visit type was conducted due to national recommendations for restrictions regarding the COVID-19 Pandemic (e.g. social distancing).  This format is felt to be most appropriate for this patient at this time.  All issues noted in this document were discussed and addressed.  No physical exam was performed (except for noted visual exam findings with Telehealth visits).  The patient has consented to conduct a Telehealth visit and understands insurance will be billed.   I connected with@, on 01/15/19 at  by a video enabled telemedicine application and verified that I am speaking with the correct person using two identifiers.   I discussed the limitations of evaluation and management by telemedicine and the availability of in person appointments. The patient expressed understanding and agreed to proceed.   I have discussed with patient regarding the safety during COVID Pandemic and steps and precautions to be taken including social distancing, frequent hand wash and use of detergent soap, gels with the patient. I asked the patient to avoid touching mouth, nose, eyes, ears with her hands. I encouraged regular walking around the neighborhood and exercise and regular diet, as long as social distancing can be maintained. Her other main complaint has been dizziness.  Subjective:  Primary Physician:  Maudie FlakesAnderson, Shane D, FNP  Patient ID: Dawn Escobar, female    DOB: 12/29/1960, 58 y.o.   MRN: [id number]  Chief Complaint  Patient presents with  . New Patient (Initial Visit)    leg swelling    HPI: Dawn OvensDenise Flamm  is a 58 y.o. female  with paroxysmal atrial fibrillation, history of stroke and had loop recorder implanted, however developed atrial fibrillation post stroke a month later and hence has been on chronic anticoagulation with Eliquis.  She has moderate obesity, hypertension, hyperlipidemia, Raynaud's disease , PTSD, tobacco use disorder referred to me for worsening  leg edema.  Patient states that she has had chronic leg edema but was the past 2 weeks has been worse.  It is also painful and feels heavy in her legs.  No other associated symptoms.  She has chronic shortness of breath, but denies PND or orthopnea.  Denies chest pain.  Her recent physical activity has been limited due to Covid 19.  Past Medical History:  Diagnosis Date  . Anxiety   . Atrial fibrillation (HCC)   . GERD (gastroesophageal reflux disease) 04/25/2015  . Hereditary and idiopathic peripheral neuropathy 04/25/2015  . Hypercholesteremia   . Hypertension   . Migraine   . PTSD (post-traumatic stress disorder)   . Raynaud's disease   . Stroke (HCC)   . Tobacco abuse     Past Surgical History:  Procedure Laterality Date  . ABDOMINAL HYSTERECTOMY    . HIP SURGERY  Right  . KNEE SURGERY    . 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 TEE WITHOUT CARDIOVERSION N/A 09/05/2014   Procedure: TRANSESOPHAGEAL ECHOCARDIOGRAM (TEE);  Surgeon: Wendall StadePeter C Nishan, MD;  [facility];  Service: Cardiovascular;  Laterality: N/A;  loop after  . WRIST SURGERY      Social History   Socioeconomic History  . Marital status: Single    Spouse name: Not on file  . Number of children: 0  . Years of education: 12th  . Highest education level: Not on file  Occupational History  . Occupation: n/a  Social Needs  . Financial resource strain: Not on file  . Food insecurity:  Worry: Not on file    Inability: Not on file  . Transportation needs:    Medical: Not on file    Non-medical: Not on file  Tobacco Use  . Smoking status: Former Smoker    Packs/day: 0.20    Types: Cigarettes  . Smokeless tobacco: Never Used  Substance and Sexual Activity  . Alcohol use: Yes    Alcohol/week: 1.0 standard drinks    Types: 1 Glasses of wine per week    Comment: occasionally  . Drug use: No    Types: Oxycodone    Comment: patient takes  oxycodone for pain  . Sexual activity: Never    Comment: pt. reports that she believes she's going through the change; have no sex drive; hot flashes and excessive sweating for a couple of months now  Lifestyle  . Physical activity:    Days per week: Not on file    Minutes per session: Not on file  . Stress: Not on file  Relationships  . Social connections:    Talks on phone: Not on file    Gets together: Not on file    Attends religious service: Not on file    Active member of club or organization: Not on file    Attends meetings of clubs or organizations: Not on file    Relationship status: Not on file  . Intimate partner violence:    Fear of current or ex partner: Not on file    Emotionally abused: Not on file    Physically abused: Not on file    Forced sexual activity: Not on file  Other Topics Concern  . Not on file  Social History Narrative   Patient lives at home alone.   Caffeine use: 1 cup of coffee and 20 oz of soda    Current Outpatient Medications on File Prior to Visit  Medication Sig Dispense Refill  . acetaminophen (TYLENOL) 500 MG tablet Take 500 mg by mouth every 6 (six) hours as needed for mild pain, moderate pain or headache.    Marland Kitchen ELIQUIS 5 MG TABS tablet TAKE 1 TABLET BY MOUTH TWICE DAILY 180 tablet 1  . hydrOXYzine (VISTARIL) 25 MG capsule TAKE 1 CAPSULE BY MOUTH AT NIGHT TIME AS NEEDED 30 capsule 3  . meclizine (ANTIVERT) 12.5 MG tablet Take 1 tablet (12.5 mg total) by mouth 3 (three) times daily as needed for dizziness. 30 tablet 0  . nortriptyline (PAMELOR) 75 MG capsule Take 75 mg by mouth every evening.    Marland Kitchen omeprazole (PRILOSEC) 20 MG capsule TAKE 1 CAPSULE BY MOUTH DAILY 30 capsule 0  . rosuvastatin (CRESTOR) 40 MG tablet TAKE 1 TABLET BY MOUTH DAILY (Patient taking differently: TAKE 1 TABLET BY MOUTH DAILY IN THE EVENING) 30 tablet 11  . traZODone (DESYREL) 100 MG tablet TAKE 1 TABLET (100 MG TOTAL) BY MOUTH AT BEDTIME. 30 tablet 2  . verapamil  (CALAN-SR) 120 MG CR tablet TAKE 1 TABLET (120 MG TOTAL) BY MOUTH DAILY. 30 tablet 0   No current facility-administered medications on file prior to visit.     Review of Systems  Constitution: Negative for chills, decreased appetite, malaise/fatigue and weight gain.  Cardiovascular: Positive for dyspnea on exertion and leg swelling. Negative for syncope.  Endocrine: Negative for cold intolerance.  Hematologic/Lymphatic: Does not bruise/bleed easily.  Musculoskeletal: Negative for joint swelling.  Gastrointestinal: Negative for abdominal pain, anorexia and change in bowel habit.  Neurological: Positive for dizziness and paresthesias. Negative for headaches and light-headedness.  Psychiatric/Behavioral: Negative for depression and substance abuse. The patient is nervous/anxious.   All other systems reviewed and are negative.      Objective:  Blood pressure (!) 132/43, height 5\' 5"  (1.651 m), weight 224 lb (101.6 kg). Body mass index is 37.28 kg/m.  Physical Exam  Constitutional: She is oriented to person, place, and time. No distress.  Neck: Neck supple.  Pulmonary/Chest: Effort normal.  Musculoskeletal:        General: Edema (2 plus pitting bilateral below knee. No signs of inflamation) present.  Neurological: She is alert and oriented to person, place, and time.    Radiology: No results found.  Laboratory Examination:  CMP Latest Ref Rng & Units 07/11/2016 06/28/2016 06/27/2016  Glucose 70 - 99 mg/dL 94 94 141(C)  BUN 6 - 23 mg/dL 9 7 6   Creatinine 0.40 - 1.20 mg/dL 3.01 3.14 3.88  Sodium 135 - 145 mEq/L 139 142 138  Potassium 3.5 - 5.1 mEq/L 4.5 3.8 3.7  Chloride 96 - 112 mEq/L 105 108 104  CO2 19 - 32 mEq/L 30 28 27   Calcium 8.4 - 10.5 mg/dL 9.2 8.7(N) 7.9(J)  Total Protein 6.0 - 8.3 g/dL 7.1 6.6 -  Total Bilirubin 0.2 - 1.2 mg/dL 0.3 0.5 -  Alkaline Phos 39 - 117 U/L 75 68 -  AST 0 - 37 U/L 16 21 -  ALT 0 - 35 U/L 12 12(L) -   CBC Latest Ref Rng & Units 07/11/2016  06/28/2016 06/27/2016  WBC 4.0 - 10.5 K/uL 7.0 4.8 8.2  Hemoglobin 12.0 - 15.0 g/dL 28.2 06.0 15.6  Hematocrit 36.0 - 46.0 % 39.0 38.4 38.5  Platelets 150.0 - 400.0 K/uL 222.0 158 160   Lipid Panel     Component Value Date/Time   CHOL 243 (H) 06/01/2015 1208   TRIG 72.0 06/01/2015 1208   HDL 43.40 06/01/2015 1208   CHOLHDL 6 06/01/2015 1208   VLDL 14.4 06/01/2015 1208   LDLCALC 185 (H) 06/01/2015 1208   HEMOGLOBIN A1C Lab Results  Component Value Date   HGBA1C 5.9 (H) 06/28/2016   MPG 123 06/28/2016   TSH No results for input(s): TSH in the last 8760 hours.  CARDIAC STUDIES:   Echocardiogram 06/02/2015: Normal LV systolic function, EF 55-60% with grade 1 diastolic dysfunction.  Moderate aortic regurgitation.  Mild mitral regurgitation.  Carotid artery duplex 09/01/2014: No significant ICA stenosis.  Assessment:    Bilateral leg edema - Plan: spironolactone (ALDACTONE) 25 MG tablet, B Nat Peptide  Dyspnea on exertion - Plan: B Nat Peptide  Raynaud's disease without gangrene  Paroxysmal atrial fibrillation (HCC) CHA2DS2-VASc Score is 4. 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  -(CHF; HTN; vasc disease DM,  Female = 1; Age <65 =0; 65-74 = 1,  >75 =2; stroke = 2).  -(Yearly risk of stroke: Score of 1=1.3; 2=2.2; 3=3.2; 4=4; 5=6.7; 6=9.8; 7=>9.8)  Recommendation:  Patient referred to me for evaluation of leg edema ongoing for several years, worse in the last 2-3 weeks.  Suspect this is related to her diet and lack of mobility, pulmonary hypertension he to be excluded, needs echocardiogram to evaluate both LV and RV function.  However I symptoms are chronic, this can wait until Covid issues are resolved.  Blood pressure is controlled, due to leg edema I have added spironolactone  25 mg daily,  she'll obtain BMP in 10 days.  I'd like to revisit in 2 weeks to see the response, I have discussed with her regarding keeping the foot elevated, walking, restricting her salt intake and calorie intake.  She has severe mixed hyperlipidemia, presently now started on statins and tolerating this.  She has had prior cardiac evaluation She'll need stress testing at some point but will wait and this can be done electively.  I'll schedule her to see me back which really in 2 weeks.  With regard to dizziness, she is on multiple psychotropic agents and all of them can cause dizziness.  Yates Decamp, MD, [employer] 01/13/2019, 2:41 PM [facility] Pager: [phone] Office: [phone] If no answer Cell [phone]

## 2019-01-15 ENCOUNTER — Encounter: Payer: Self-pay | Admitting: Cardiology

## 2019-01-22 MED FILL — CYCLOBENZAPRINE HCL 10 MG T: 10 | 30 days supply | Qty: 90 | Fill #1

## 2019-01-25 MED FILL — ELIQUIS 5 MG TABLET: 5 | 30 days supply | Qty: 60 | Fill #4

## 2019-01-28 LAB — COMPREHENSIVE METABOLIC PANEL
ALT: 12 IU/L (ref 0–32)
AST: 15 IU/L (ref 0–40)
Albumin/Globulin Ratio: 1.6 (ref 1.2–2.2)
Albumin: 4.5 g/dL (ref 3.8–4.9)
Alkaline Phosphatase: 89 IU/L (ref 39–117)
BUN/Creatinine Ratio: 12 (ref 9–23)
BUN: 11 mg/dL (ref 6–24)
Bilirubin Total: 0.3 mg/dL (ref 0.0–1.2)
CO2: 21 mmol/L (ref 20–29)
Calcium: 9.6 mg/dL (ref 8.7–10.2)
Chloride: 100 mmol/L (ref 96–106)
Creatinine, Ser: 0.91 mg/dL (ref 0.57–1.00)
GFR calc Af Amer: 81 mL/min/{1.73_m2} (ref >59)
GFR calc non Af Amer: 70 mL/min/{1.73_m2} (ref >59)
Globulin, Total: 2.9 g/dL (ref 1.5–4.5)
Glucose: 146 mg/dL — ABNORMAL HIGH (ref 65–99)
Potassium: 4.8 mmol/L (ref 3.5–5.2)
Sodium: 139 mmol/L (ref 134–144)
Total Protein: 7.4 g/dL (ref 6.0–8.5)

## 2019-01-28 LAB — BRAIN NATRIURETIC PEPTIDE: BNP: 32.9 pg/mL (ref 0.0–100.0)

## 2019-01-28 MED FILL — OXYCODONE-APAP 10-325: 10-325 | 30 days supply | Qty: 120 | Fill #0

## 2019-02-06 NOTE — Progress Notes (Signed)
Virtual Visit via Video Note: This visit type was conducted due to national recommendations for restrictions regarding the COVID-19 Pandemic (e.g. social distancing).  This format is felt to be most appropriate for this patient at this time.  All issues noted in this document were discussed and addressed.  No physical exam was performed (except for noted visual exam findings with Telehealth visits).  The patient has consented to conduct a Telehealth visit and understands insurance will be billed.   I connected with@, on 02/07/19 at  by a video enabled telemedicine application and verified that I am speaking with the correct person using two identifiers.   I discussed the limitations of evaluation and management by telemedicine and the availability of in person appointments. The patient expressed understanding and agreed to proceed.   I have discussed with patient regarding the safety during COVID Pandemic and steps and precautions to be taken including social distancing, frequent hand wash and use of detergent soap, gels with the patient. I asked the patient to avoid touching mouth, nose, eyes, ears with her hands. I encouraged regular walking around the neighborhood and exercise and regular diet, as long as social distancing can be maintained. Her other main complaint has been dizziness.  Subjective:  Primary Physician:  Maudie Flakes, FNP  Patient ID: Dawn Escobar, female    DOB: 11/17/1960, 58 y.o.   MRN: [id number]  Chief Complaint  Patient presents with  . Leg Swelling    Pt c/o swelling in leg    HPI: Dawn Escobar  is a 58 y.o. female  with paroxysmal atrial fibrillation, history of stroke and had loop recorder implanted, however developed atrial fibrillation post stroke a month later and hence has been on chronic anticoagulation with Eliquis.  She has moderate obesity, hypertension, hyperlipidemia, Raynaud's disease , PTSD, tobacco use disorder, I am seeing for worsening leg  edema and hypertension f/u. I had starter her on Aldactone and had discussed edema management.   She has had chronic leg edema, It is also painful and feels heavy in her legs.  On her last office visit I started her on spironolactone, since then she has noticed significant improvement in leg edema and also states that she has stopped using furosemide.  She still has swelling in the legs in the evening.  No significant change in her chronic dyspnea.  Denies PND or orthopnea. Denies chest pain.  Her recent physical activity has been limited due to Covid 19.  Past Medical History:  Diagnosis Date  . Anxiety   . Atrial fibrillation (HCC)   . GERD (gastroesophageal reflux disease) 04/25/2015  . Hereditary and idiopathic peripheral neuropathy 04/25/2015  . Hypercholesteremia   . Hypertension   . Migraine   . PTSD (post-traumatic stress disorder)   . Raynaud's disease   . Stroke (HCC)   . Tobacco abuse     Past Surgical History:  Procedure Laterality Date  . ABDOMINAL HYSTERECTOMY    . HIP SURGERY  Right  . KNEE SURGERY    . 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 TEE WITHOUT CARDIOVERSION N/A 09/05/2014   Procedure: TRANSESOPHAGEAL ECHOCARDIOGRAM (TEE);  Surgeon: Wendall Stade, MD;  Location: [facility] ENDOSCOPY;  Service: Cardiovascular;  Laterality: N/A;  loop after  . WRIST SURGERY      Social History   Socioeconomic History  . Marital status: Single    Spouse name: Not on file  . Number of children:  0  . Years of education: 12th  . Highest education level: Not on file  Occupational History  . Occupation: n/a  Social Needs  . Financial resource strain: Not on file  . Food insecurity:    Worry: Not on file    Inability: Not on file  . Transportation needs:    Medical: Not on file    Non-medical: Not on file  Tobacco Use  . Smoking status: Former Smoker    Packs/day: 0.20    Types: Cigarettes  . Smokeless tobacco: Never  Used  Substance and Sexual Activity  . Alcohol use: Yes    Alcohol/week: 1.0 standard drinks    Types: 1 Glasses of wine per week    Comment: occasionally  . Drug use: No    Types: Oxycodone    Comment: patient takes oxycodone for pain  . Sexual activity: Never    Comment: pt. reports that she believes she's going through the change; have no sex drive; hot flashes and excessive sweating for a couple of months now  Lifestyle  . Physical activity:    Days per week: Not on file    Minutes per session: Not on file  . Stress: Not on file  Relationships  . Social connections:    Talks on phone: Not on file    Gets together: Not on file    Attends religious service: Not on file    Active member of club or organization: Not on file    Attends meetings of clubs or organizations: Not on file    Relationship status: Not on file  . Intimate partner violence:    Fear of current or ex partner: Not on file    Emotionally abused: Not on file    Physically abused: Not on file    Forced sexual activity: Not on file  Other Topics Concern  . Not on file  Social History Narrative   Patient lives at home alone.   Caffeine use: 1 cup of coffee and 20 oz of soda    Current Outpatient Medications on File Prior to Visit  Medication Sig Dispense Refill  . acetaminophen (TYLENOL) 500 MG tablet Take 500 mg by mouth every 6 (six) hours as needed for mild pain, moderate pain or headache.    . cetirizine (ZYRTEC) 10 MG tablet Take 10 mg by mouth daily.    . cyclobenzaprine (FLEXERIL) 10 MG tablet Take 10 mg by mouth daily.    Marland Kitchen ELIQUIS 5 MG TABS tablet TAKE 1 TABLET BY MOUTH TWICE DAILY 180 tablet 1  . gabapentin (NEURONTIN) 600 MG tablet Take 600 mg by mouth 3 (three) times daily.    . hydrOXYzine (VISTARIL) 25 MG capsule TAKE 1 CAPSULE BY MOUTH AT NIGHT TIME AS NEEDED 30 capsule 3  . nortriptyline (PAMELOR) 75 MG capsule Take 75 mg by mouth every evening.    Marland Kitchen omeprazole (PRILOSEC) 20 MG capsule TAKE  1 CAPSULE BY MOUTH DAILY 30 capsule 0  . oxyCODONE-acetaminophen (PERCOCET) 10-325 MG tablet Take 1 tablet by mouth as needed.    Marland Kitchen PARoxetine (PAXIL) 20 MG tablet Take 20 mg by mouth daily.    . rosuvastatin (CRESTOR) 40 MG tablet TAKE 1 TABLET BY MOUTH DAILY (Patient taking differently: TAKE 1 TABLET BY MOUTH DAILY IN THE EVENING) 30 tablet 11  . verapamil (CALAN-SR) 120 MG CR tablet TAKE 1 TABLET (120 MG TOTAL) BY MOUTH DAILY. 30 tablet 0  . ferrous sulfate 325 (65 FE) MG tablet Take 325  mg by mouth daily with breakfast.     No current facility-administered medications on file prior to visit.    Review of Systems  Constitution: Negative for chills, decreased appetite, malaise/fatigue and weight gain.  Cardiovascular: Positive for dyspnea on exertion and leg swelling (improved with aldactone). Negative for syncope.  Endocrine: Negative for cold intolerance.  Hematologic/Lymphatic: Does not bruise/bleed easily.  Musculoskeletal: Negative for joint swelling.  Gastrointestinal: Negative for abdominal pain, anorexia and change in bowel habit.  Neurological: Positive for dizziness (chronic) and paresthesias. Negative for headaches and light-headedness.  Psychiatric/Behavioral: Negative for depression and substance abuse. The patient is nervous/anxious.   All other systems reviewed and are negative.     Objective:  Height  (1.651 m), weight 224 lb (101.6 kg). Body mass index is 37.28 kg/m. Physical exam not performed or limited due to virtual visit. Please see exam details from prior visit is as below. She appears to be in no distress, neck was supple, respiration is not labored. Edema is improved significantly 1-2 plus Prior exam is as below    Physical Exam  Constitutional: She is oriented to person, place, and time. No distress.  Neck: Neck supple.  Pulmonary/Chest: Effort normal.  Musculoskeletal:        General: Edema (2 plus pitting bilateral below knee. No signs of  inflamation) present.  Neurological: She is alert and oriented to person, place, and time.   Radiology: No results found.  Laboratory Examination:  Lipid profile 02/05/2018: Total cholesterol 128, triglycerides 93, HDL 53, LDL 56.  CMP Latest Ref Rng & Units 01/27/2019 07/11/2016 06/28/2016  Glucose 65 - 99 mg/dL 782(N) 94 94  BUN 6 - 24 mg/dL Creatinine 0.57 - 1.00 mg/dL 5.62 1.30 8.65  Sodium 134 - 144 mmol/L 139 139 142  Potassium 3.5 - 5.2 mmol/L 4.8 4.5 3.8  Chloride 96 - 106 mmol/L 100 105 108  CO2 20 - 29 mmol/L Calcium 8.7 - 10.2 mg/dL 9.6 9.2 7.8(I)  Total Protein 6.0 - 8.5 g/dL 7.4 7.1 6.6  Total Bilirubin 0.0 - 1.2 mg/dL 0.3 0.3 0.5  Alkaline Phos 39 - 117 IU/L 89 75 68  AST 0 - 40 IU/L ALT 0 - 32 IU/L 12 12 12(L)   CBC Latest Ref Rng & Units 07/11/2016 06/28/2016 06/27/2016  WBC 4.0 - 10.5 K/uL 7.0 4.8 8.2  Hemoglobin 12.0 - 15.0 g/dL 69.6 29.5 28.4  Hematocrit 36.0 - 46.0 % 39.0 38.4 38.5  Platelets 150.0 - 400.0 K/uL 222.0 158 160   Lipid Panel     Component Value Date/Time   CHOL 243 (H) 06/01/2015 1208   TRIG 72.0 06/01/2015 1208   HDL 43.40 06/01/2015 1208   CHOLHDL 6 06/01/2015 1208   VLDL 14.4 06/01/2015 1208   LDLCALC 185 (H) 06/01/2015 1208   HEMOGLOBIN A1C Lab Results  Component Value Date   HGBA1C 5.9 (H) 06/28/2016   MPG 123 06/28/2016   TSH No results for input(s): TSH in the last 8760 hours.  CARDIAC STUDIES:   Echocardiogram 06/02/2015: Normal LV systolic function, EF 55-60% with grade 1 diastolic dysfunction.  Moderate aortic regurgitation.  Mild mitral regurgitation.  Carotid artery duplex 09/01/2014: No significant ICA stenosis.  Assessment:    Bilateral leg edema - Plan: spironolactone (ALDACTONE) 50 MG tablet, Basic Metabolic Panel (BMET), PCV ECHOCARDIOGRAM COMPLETE  Paroxysmal atrial fibrillation (HCC) CHA2DS2-VASc Score is 4. 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  -(CHF; HTN; vasc disease DM,  Female = 1; Age <65 =0; 65-74 = 1,  >75 =2; stroke = 2).  -(Yearly risk of stroke: Score of 1=1.3; 2=2.2; 3=3.2; 4=4; 5=6.7; 6=9.8; 7=>9.8)  Recommendation:  Patient referred to me for evaluation of leg edema ongoing for several years, I had added spironolactone 25 mg daily, both for leg edema and hypertension. BMP stable I will schedule her for echocardiogram and also Schedule for a Exercise Nuclear stress test to evaluate for myocardial ischemia in view of dyspnea, mixed hyperlipidemia, hyperglycemia and prior tobacco use.  Office visit following the work-up/investigations, this test could not be performed last month due to COVID-19.Marland Kitchen.   She has severe mixed hyperlipidemia,  Recently started on statins and tolerating this.  Her labs are improving as of last year, she will need repeat lipid profile testing, needs A1c and repeat BMP with next lab draw as a increased spironolactone to 50 mg daily for leg edema.  Fortunately since being on Aldactone, she has discontinued furosemide and is using it on a as needed basis.  Also advised her to use support stockings.  With regard to paroxysmal atrial fibrillation, continue long-term anticoagulation in view of very high cardioembolic risk.  She has not had any further episodes of Raynard's phenomena recently.  With regard to dizziness, she is on multiple psychotropic agents and all of them can cause dizziness.  She do not have a blood pressure check today.  We will recheck orthostatics on her next office visit in 4 to 6 weeks.  Yates DecampJay Yana Schorr, MD, [facility]  02/07/2019, 1:18 PM [facility] Pager: [phone] Office: [phone] If no answer Cell [phone]

## 2019-02-07 ENCOUNTER — Other Ambulatory Visit: Payer: Self-pay

## 2019-02-07 ENCOUNTER — Ambulatory Visit (INDEPENDENT_AMBULATORY_CARE_PROVIDER_SITE_OTHER): Payer: Medicare Other | Admitting: Cardiology

## 2019-02-07 ENCOUNTER — Encounter: Payer: Self-pay | Admitting: Cardiology

## 2019-02-07 VITALS — Ht 65.0 in | Wt 224.0 lb

## 2019-02-07 DIAGNOSIS — I7300 Raynaud's syndrome without gangrene: Secondary | ICD-10-CM

## 2019-02-07 DIAGNOSIS — I480 Paroxysmal atrial fibrillation: Secondary | ICD-10-CM

## 2019-02-07 DIAGNOSIS — R0609 Other forms of dyspnea: Secondary | ICD-10-CM

## 2019-02-07 DIAGNOSIS — E7800 Pure hypercholesterolemia, unspecified: Secondary | ICD-10-CM

## 2019-02-07 DIAGNOSIS — R739 Hyperglycemia, unspecified: Secondary | ICD-10-CM

## 2019-02-07 DIAGNOSIS — R600 Localized edema: Secondary | ICD-10-CM | POA: Diagnosis not present

## 2019-02-07 DIAGNOSIS — I63032 Cerebral infarction due to thrombosis of left carotid artery: Secondary | ICD-10-CM

## 2019-02-07 MED ORDER — SPIRONOLACTONE 50 MG PO TABS: 50.0000 mg | ORAL_TABLET | Freq: Every day | ORAL | 1 refills | Status: DC

## 2019-02-07 MED FILL — SPIRONOLACTONE 50 MG TABLET: 50 | 90 days supply | Qty: 90 | Fill #0

## 2019-02-14 MED FILL — GABAPENTIN 600 MG TABS: 600 | 30 days supply | Qty: 90 | Fill #1

## 2019-02-14 MED FILL — FUROSEMIDE 40 MG TAB: 40 | 30 days supply | Qty: 30 | Fill #1

## 2019-02-14 MED FILL — SPIRONOLACTONE 25 MG TABLET: 25 | 30 days supply | Qty: 30 | Fill #1

## 2019-02-16 ENCOUNTER — Telehealth: Payer: Self-pay

## 2019-02-16 NOTE — Telephone Encounter (Signed)
It should not cause this symptom. Ask her to try taking 1/2 tablet and gradually increase as tolerated

## 2019-02-16 NOTE — Telephone Encounter (Signed)
Pt called stating the Aldactone is making her break out in sweats and she cant take that anymore; Please advise on the replacement

## 2019-02-22 MED FILL — PARoxetine HCL 20 MG TABS: 20 | 90 days supply | Qty: 90 | Fill #0

## 2019-02-22 MED FILL — ELIQUIS 5 MG TABLET: 5 | 30 days supply | Qty: 60 | Fill #5

## 2019-02-22 MED FILL — ROSUVASTATIN CALCIUM 40 MG: 40 | 90 days supply | Qty: 90 | Fill #0

## 2019-02-22 MED FILL — CYCLOBENZAPRINE HCL 10 MG T: 10 | 30 days supply | Qty: 90 | Fill #0

## 2019-02-24 MED FILL — CYCLOBENZAPRINE HCL 5 MG TA: 5 | 15 days supply | Qty: 30 | Fill #0

## 2019-03-01 ENCOUNTER — Telehealth: Payer: Self-pay

## 2019-03-01 MED FILL — OXYCODONE-APAP 10-325: 10-325 | 30 days supply | Qty: 120 | Fill #0

## 2019-03-01 NOTE — Telephone Encounter (Signed)
Error

## 2019-03-07 ENCOUNTER — Other Ambulatory Visit: Payer: Medicare Other

## 2019-03-07 ENCOUNTER — Other Ambulatory Visit: Payer: Self-pay

## 2019-03-07 DIAGNOSIS — R600 Localized edema: Secondary | ICD-10-CM

## 2019-03-11 ENCOUNTER — Telehealth (HOSPITAL_COMMUNITY): Payer: Self-pay

## 2019-03-11 NOTE — Telephone Encounter (Signed)
Left voicemail confirming Covid instructions.

## 2019-03-14 ENCOUNTER — Encounter: Payer: Medicare Other | Admitting: Surgery

## 2019-03-14 ENCOUNTER — Ambulatory Visit (HOSPITAL_COMMUNITY): Admission: RE | Admit: 2019-03-14 | Payer: Medicare Other | Source: Ambulatory Visit

## 2019-03-17 ENCOUNTER — Other Ambulatory Visit: Payer: Medicare Other

## 2019-03-21 ENCOUNTER — Other Ambulatory Visit: Payer: Medicare Other

## 2019-03-21 DIAGNOSIS — Z5329 Procedure and treatment not carried out because of patient's decision for other reasons: Secondary | ICD-10-CM

## 2019-03-22 MED FILL — hydrOXYzine HCL 25 MG TABS: 25 | 15 days supply | Qty: 90 | Fill #0

## 2019-03-22 MED FILL — GABAPENTIN 600 MG TAB: 600 | 30 days supply | Qty: 90 | Fill #0

## 2019-03-22 MED FILL — OMEPRAZOLE 20 MG CPDR: 20 | 90 days supply | Qty: 90 | Fill #1

## 2019-03-24 ENCOUNTER — Ambulatory Visit: Payer: Medicare Other | Admitting: Cardiology

## 2019-03-24 MED FILL — ELIQUIS 5 MG TABLET: 5 | 30 days supply | Qty: 60 | Fill #0

## 2019-03-30 ENCOUNTER — Ambulatory Visit (INDEPENDENT_AMBULATORY_CARE_PROVIDER_SITE_OTHER): Payer: Medicare Other

## 2019-03-30 ENCOUNTER — Other Ambulatory Visit: Payer: Self-pay

## 2019-03-30 DIAGNOSIS — I63032 Cerebral infarction due to thrombosis of left carotid artery: Secondary | ICD-10-CM | POA: Diagnosis not present

## 2019-03-30 DIAGNOSIS — R600 Localized edema: Secondary | ICD-10-CM

## 2019-03-31 MED FILL — OXYCODONE-APAP 10-325: 10-325 | 30 days supply | Qty: 120 | Fill #0

## 2019-04-04 ENCOUNTER — Other Ambulatory Visit: Payer: Medicare Other

## 2019-04-04 ENCOUNTER — Telehealth: Payer: Self-pay

## 2019-04-04 NOTE — Telephone Encounter (Signed)
Pt called stating that she was in the shower and one of her varicose veins burst and started squirting out. She is scheduled for a nuc testing today and wants to know what to do. Verbally discussed with MP and AK and was told to advise pt to bandage area applying pressure and if it hasnt stopped to go to ED and to cancel testing for today. Pt ware of instructions.//ah

## 2019-04-11 MED FILL — VERAPAMIL HCL 120 MG TABS: 120 | 90 days supply | Qty: 90 | Fill #3

## 2019-04-11 MED FILL — NORTRIPTYLINE HCL 75 MG CAP: 75 | 90 days supply | Qty: 90 | Fill #0

## 2019-04-18 MED FILL — CYCLOBENZAPRINE HCL 10 MG T: 10 | 30 days supply | Qty: 90 | Fill #1

## 2019-04-20 ENCOUNTER — Encounter: Payer: Self-pay | Admitting: Cardiology

## 2019-04-22 ENCOUNTER — Ambulatory Visit (INDEPENDENT_AMBULATORY_CARE_PROVIDER_SITE_OTHER): Payer: Medicare Other | Admitting: Cardiology

## 2019-04-22 ENCOUNTER — Encounter: Payer: Self-pay | Admitting: Cardiology

## 2019-04-22 ENCOUNTER — Other Ambulatory Visit: Payer: Self-pay

## 2019-04-22 VITALS — BP 116/59 | HR 87 | Temp 98.6°F | Ht 65.0 in | Wt 211.0 lb

## 2019-04-22 DIAGNOSIS — I7300 Raynaud's syndrome without gangrene: Secondary | ICD-10-CM | POA: Diagnosis not present

## 2019-04-22 DIAGNOSIS — D649 Anemia, unspecified: Secondary | ICD-10-CM

## 2019-04-22 DIAGNOSIS — R0609 Other forms of dyspnea: Secondary | ICD-10-CM

## 2019-04-22 DIAGNOSIS — I63032 Cerebral infarction due to thrombosis of left carotid artery: Secondary | ICD-10-CM

## 2019-04-22 DIAGNOSIS — D509 Iron deficiency anemia, unspecified: Secondary | ICD-10-CM

## 2019-04-22 DIAGNOSIS — I480 Paroxysmal atrial fibrillation: Secondary | ICD-10-CM | POA: Diagnosis not present

## 2019-04-22 DIAGNOSIS — I10 Essential (primary) hypertension: Secondary | ICD-10-CM | POA: Diagnosis not present

## 2019-04-22 DIAGNOSIS — E7800 Pure hypercholesterolemia, unspecified: Secondary | ICD-10-CM

## 2019-04-22 DIAGNOSIS — R600 Localized edema: Secondary | ICD-10-CM

## 2019-04-22 MED ORDER — DOXAZOSIN MESYLATE 4 MG PO TABS: 4.0000 mg | ORAL_TABLET | Freq: Every day | ORAL | 2 refills | Status: DC

## 2019-04-22 MED FILL — DOXAZOSIN MESYLATE 4 MG TAB: 4 | 30 days supply | Qty: 30 | Fill #0

## 2019-04-22 NOTE — Progress Notes (Signed)
Subjective:  Primary Physician:  Gregor Hams, FNP  Patient ID: Dawn Escobar, female    DOB: 07-23-1961, 58 y.o.   MRN: [id number]  Chief Complaint  Patient presents with  . Edema  . Hypertension  . Pre-op Exam  . Follow-up    HPI: Dawn Escobar  is a 58 y.o. female  with paroxysmal atrial fibrillation, history of stroke in 2015 and had loop recorder implanted, however developed atrial fibrillation post stroke a month later and hence has been on chronic anticoagulation with Eliquis.  She has moderate obesity, hypertension, hyperlipidemia, Raynaud's disease , PTSD, tobacco use disorder presents for leg edema, hypertension and pre op evaluation.  She is scheduled for left hip arthroplasty. She did not get labs or the stress test ordered. With regards to leg edema,  I had starter her on Aldactone, since then she has noticed significant improvement in leg edema.  She also request that I change her verapamil to doxazosin, states that when she moved from [place], this was discontinued and since then she feels her Raynaud's has been worse. She continues to c/o dizziness.  No significant change in her chronic dyspnea.  Denies PND or orthopnea. Denies chest pain. Still smoking 1/2 pack of cigarettes. Trying to loose weight. On 04/04/2019, she presented to the emergency room with bleeding varicose veins and stopped with silver nitrate stick.  Past Medical History:  Diagnosis Date  . Anxiety   . Atrial fibrillation ([place])   . GERD (gastroesophageal reflux disease) 04/25/2015  . Hereditary and idiopathic peripheral neuropathy 04/25/2015  . Hypercholesteremia   . Hypertension   . Migraine   . PTSD (post-traumatic stress disorder)   . Raynaud's disease   . Stroke ([place])   . Tobacco abuse     Past Surgical History:  Procedure Laterality Date  . ABDOMINAL HYSTERECTOMY    . HIP SURGERY  Right  . KNEE SURGERY    . 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 TEE WITHOUT CARDIOVERSION N/A 09/05/2014   Procedure: TRANSESOPHAGEAL ECHOCARDIOGRAM (TEE);  Surgeon: Josue Hector, MD;  Location[facility] ENDOSCOPY;  Service: Cardiovascular;  Laterality: N/A;  loop after  . WRIST SURGERY      Social History   Socioeconomic History  . Marital status: Single    Spouse name: Not on file  . Number of children: 0  . Years of education: 12th  . Highest education level: Not on file  Occupational History  . Occupation: n/a  Social Needs  . Financial resource strain: Not on file  . Food insecurity    Worry: Not on file    Inability: Not on file  . Transportation needs    Medical: Not on file    Non-medical: Not on file  Tobacco Use  . Smoking status: Current Every Day Smoker    Packs/day: 0.50    Types: Cigarettes  . Smokeless tobacco: Never Used  Substance and Sexual Activity  . Alcohol use: Yes    Alcohol/week: 1.0 standard drinks    Types: 1 Glasses of wine per week    Comment: occasionally  . Drug use: No    Types: Oxycodone    Comment: patient takes oxycodone for pain  . Sexual activity: Never    Comment: pt. reports that she believes she's going through the change; have no sex drive; hot flashes and excessive sweating for a couple of months now  Lifestyle  . Physical  activity    Days per week: Not on file    Minutes per session: Not on file  . Stress: Not on file  Relationships  . Social Musicianconnections    Talks on phone: Not on file    Gets together: Not on file    Attends religious service: Not on file    Active member of club or organization: Not on file    Attends meetings of clubs or organizations: Not on file    Relationship status: Not on file  . Intimate partner violence    Fear of current or ex partner: Not on file    Emotionally abused: Not on file    Physically abused: Not on file    Forced sexual activity: Not on file  Other Topics Concern  . Not on file  Social History Narrative   Patient lives  at home alone.   Caffeine use: 1 cup of coffee and 20 oz of soda    Current Outpatient Medications on File Prior to Visit  Medication Sig Dispense Refill  . acetaminophen (TYLENOL) 500 MG tablet Take 500 mg by mouth every 6 (six) hours as needed for mild pain, moderate pain or headache.    . Ascorbic Acid (VITAMIN C PO) Take by mouth daily.    . cyclobenzaprine (FLEXERIL) 10 MG tablet Take 10 mg by mouth daily.    Marland Kitchen. ELIQUIS 5 MG TABS tablet TAKE 1 TABLET BY MOUTH TWICE DAILY 180 tablet 1  . furosemide (LASIX) 40 MG tablet Take 40 mg by mouth daily.     Marland Kitchen. gabapentin (NEURONTIN) 600 MG tablet Take 600 mg by mouth 2 (two) times a day.    Marland Kitchen. HYDROcodone-acetaminophen (NORCO) 10-325 MG tablet Take 1 tablet by mouth daily.    . hydrOXYzine (VISTARIL) 25 MG capsule TAKE 1 CAPSULE BY MOUTH AT NIGHT TIME AS NEEDED 30 capsule 3  . levocetirizine (XYZAL) 5 MG tablet Take 5 mg by mouth every evening.    . nortriptyline (PAMELOR) 75 MG capsule Take 75 mg by mouth every evening.    Marland Kitchen. omeprazole (PRILOSEC) 20 MG capsule TAKE 1 CAPSULE BY MOUTH DAILY 30 capsule 0  . PARoxetine (PAXIL) 20 MG tablet Take 20 mg by mouth daily.    . rosuvastatin (CRESTOR) 40 MG tablet TAKE 1 TABLET BY MOUTH DAILY (Patient taking differently: TAKE 1 TABLET BY MOUTH DAILY IN THE EVENING) 30 tablet 11  . spironolactone (ALDACTONE) 50 MG tablet Take 1 tablet (50 mg total) by mouth daily. 90 tablet 1   No current facility-administered medications on file prior to visit.    ROS:   Review of Systems  Constitution: Negative for chills, decreased appetite, malaise/fatigue and weight gain.  Cardiovascular: Positive for dyspnea on exertion and leg swelling (improved with aldactone). Negative for syncope.  Endocrine: Negative for cold intolerance.  Hematologic/Lymphatic: Does not bruise/bleed easily.  Musculoskeletal: Positive for back pain and joint pain (left hip). Negative for joint swelling.  Gastrointestinal: Negative for  abdominal pain, anorexia and change in bowel habit.  Neurological: Positive for dizziness (chronic) and paresthesias. Negative for headaches and light-headedness.  Psychiatric/Behavioral: Negative for depression and substance abuse. The patient is nervous/anxious.   All other systems reviewed and are negative.     Objective:  Blood pressure (!) 116/59, pulse 87, temperature 98.6 F (37 C), height 5\' 5"  (1.651 m), weight 211 lb (95.7 kg), SpO2 97 %. Body mass index is 35.11 kg/m.   Physical Exam  Constitutional: She is oriented to  person, place, and time. She appears well-developed. No distress.  Moderately obese  HENT:  Head: Atraumatic.  Eyes: Conjunctivae are normal.  Neck: Neck supple. No thyromegaly present.  Cardiovascular: Normal rate, regular rhythm, normal heart sounds, intact distal pulses and normal pulses. Exam reveals no gallop.  No murmur heard. Trace edema bilateral. Varicose veins noted. No JVD.   Pulmonary/Chest: Effort normal and breath sounds normal.  Abdominal: Soft. Bowel sounds are normal.  Musculoskeletal: Normal range of motion.  Neurological: She is alert and oriented to person, place, and time.  Skin: Skin is warm and dry.  Psychiatric: She has a normal mood and affect.   Radiology: No results found.  Laboratory examination:    Lipid profile 02/05/2018: Total cholesterol 128, triglycerides 93, HDL 53, LDL 56.  CMP Latest Ref Rng & Units 01/27/2019 07/11/2016 06/28/2016  Glucose 65 - 99 mg/dL 161(W146(H) 94 94  BUN 6 - 24 mg/dL 11 9 7   Creatinine 0.57 - 1.00 mg/dL 9.600.91 4.540.90 0.980.79  Sodium 134 - 144 mmol/L 139 139 142  Potassium 3.5 - 5.2 mmol/L 4.8 4.5 3.8  Chloride 96 - 106 mmol/L 100 105 108  CO2 20 - 29 mmol/L 21 30 28   Calcium 8.7 - 10.2 mg/dL 9.6 9.2 1.1(B8.5(L)  Total Protein 6.0 - 8.5 g/dL 7.4 7.1 6.6  Total Bilirubin 0.0 - 1.2 mg/dL 0.3 0.3 0.5  Alkaline Phos 39 - 117 IU/L 89 75 68  AST 0 - 40 IU/L 15 16 21   ALT 0 - 32 IU/L 12 12 12(L)   CBC Latest  Ref Rng & Units 07/11/2016 06/28/2016 06/27/2016  WBC 4.0 - 10.5 K/uL 7.0 4.8 8.2  Hemoglobin 12.0 - 15.0 g/dL 14.713.0 82.913.0 56.212.8  Hematocrit 36.0 - 46.0 % 39.0 38.4 38.5  Platelets 150.0 - 400.0 K/uL 222.0 158 160   Lipid Panel     Component Value Date/Time   CHOL 243 (H) 06/01/2015 1208   TRIG 72.0 06/01/2015 1208   HDL 43.40 06/01/2015 1208   CHOLHDL 6 06/01/2015 1208   VLDL 14.4 06/01/2015 1208   LDLCALC 185 (H) 06/01/2015 1208   HEMOGLOBIN A1C Lab Results  Component Value Date   HGBA1C 5.9 (H) 06/28/2016   MPG 123 06/28/2016   TSH No results for input(s): TSH in the last 8760 hours.  BNP    Component Value Date/Time   BNP 32.9 01/27/2019 1604    Cardiac studies:    Carotid artery duplex 09/01/2014: No significant ICA stenosis.  Echocardiogram 03/30/2019: Normal LV systolic function with EF 55%. Left ventricle cavity is normal in size. Normal global wall motion. Calculated EF 55%. Left atrial cavity is severely dilated, LA vol index 58 cc/m2. Trileaflet aortic valve. Moderate (Grade III) aortic regurgitation. Doming and hockeystick appearance of mitral valve leaflets consistent with rheumatic valve disease. No significant calcification seen. Inadequate Doppler data to estimate severity of mitral stenosis. 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 Moderate (Grade III) mitral regurgitation. Mild tricuspid regurgitation. Estimated pulmonary artery systolic pressure is 27 mmHg. Compared to previous study in 2016, mitral stenosis is new. Mitral and aortic regurgitation more prominent.   Echocardiogram 06/02/2015: Normal LV systolic function, EF 55-60% with grade 1 diastolic dysfunction.  Moderate aortic regurgitation.  Mild mitral regurgitation.  Assessment:      ICD-10-CM   1. Bilateral leg edema  R60.0   2. 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 Raynaud's disease without gangrene  I73.00  doxazosin  (CARDURA) 4 MG tablet  4. Dyspnea on exertion  R06.09   5. Paroxysmal atrial fibrillation (HCC) CHA2DS2-VASc Score is 4. Yearly risk of stroke:  4%  I48.0 EKG 12-Lead  6. Hypercholesteremia  E78.00   7. Essential hypertension  I10 doxazosin (CARDURA) 4 MG tablet  8. Iron deficiency anemia, unspecified iron deficiency anemia type  D50.9   9. Anemia, unspecified type  D64.9 Iron and TIBC    Ferritin   EKG 04/22/2019: Normal sinus rhythm at rate of 92 bpm, left atrial enlargement, normal axis.  No evidence of ischemia, otherwise normal EKG.  Recommendations:    Patient referred to me for evaluation of leg edema ongoing for several years, now she is almost complete resolution of edema and just has adipose tissue. She has severe mixed hyperlipidemia, she is tolerating Crestor but has not had repeat labs which had recommended.  Due to worsening symptoms of Raynaud's, she requests doxazosin 8 mg [place] and wants to discontinue verapamil. I will prescribe 4 mg in the evening and discussed with her regarding dizziness.  She is on multiple psychotropic agents and also pain medication caused as well as.  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 With regard to paroxysmal atrial fibrillation, continue long-term anticoagulation in view of very high cardioembolic risk.  She also has severely dilated LA on echo and hence high risk for recurrence of A. Fib.  This should not prohibit her from left hip arthroplasty.    Her dyspnea and lack of physical activity due to arthritis and back pain limits functional evaluation for the upcoming left hip surgery. She has normal EKG and no CHF and and also [facility] and feels that her CV risk for surgery is not prohibitive. I will send pre-op risk note to her surgeon and to proceed with surgery which will improve her QOL.   Advised her to  obtain labs that were previously ordered, including lipids.  She also requests iron studies which I ordered as she does indeed have history of wine deficiency anemia but unable to tolerate oral iron. I will see her back in 6 weeks. I advised her to f/u with one Cardiology team so there is no confusion, patient to decide.   Hold Eliquis for 2 days prior to surgery and restart post op when stable from surgical stand point.  CC: Darnell LevelSlade Moore, MD (Ortho) Yates DecampJay Armon Orvis, MD, [facility] Center[facility]ACC 04/24/2019, 7:21 AM [facility] Pager: [phone] Office: [phone] If no answer Cell [phone]

## 2019-04-24 ENCOUNTER — Encounter: Payer: Self-pay | Admitting: Cardiology

## 2019-04-26 MED FILL — ELIQUIS 5 MG TABLET: 5 | 30 days supply | Qty: 60 | Fill #1

## 2019-04-30 LAB — BASIC METABOLIC PANEL
BUN/Creatinine Ratio: 9 (ref 9–23)
BUN: 9 mg/dL (ref 6–24)
CO2: 23 mmol/L (ref 20–29)
Calcium: 9.3 mg/dL (ref 8.7–10.2)
Chloride: 102 mmol/L (ref 96–106)
Creatinine, Ser: 1 mg/dL (ref 0.57–1.00)
GFR calc Af Amer: 72 mL/min/{1.73_m2} (ref >59)
GFR calc non Af Amer: 63 mL/min/{1.73_m2} (ref >59)
Glucose: 105 mg/dL — ABNORMAL HIGH (ref 65–99)
Potassium: 5.6 mmol/L — ABNORMAL HIGH (ref 3.5–5.2)
Sodium: 138 mmol/L (ref 134–144)

## 2019-04-30 LAB — FERRITIN: Ferritin: 117 ng/mL (ref 15–150)

## 2019-04-30 LAB — LIPID PANEL WITH LDL/HDL RATIO
Cholesterol, Total: 126 mg/dL (ref 100–199)
HDL: 47 mg/dL (ref >39)
LDL Calculated: 60 mg/dL (ref 0–99)
LDl/HDL Ratio: 1.3 ratio (ref 0.0–3.2)
Triglycerides: 93 mg/dL (ref 0–149)
VLDL Cholesterol Cal: 19 mg/dL (ref 5–40)

## 2019-04-30 LAB — IRON AND TIBC
Iron Saturation: 26 % (ref 15–55)
Iron: 87 ug/dL (ref 27–159)
Total Iron Binding Capacity: 329 ug/dL (ref 250–450)
UIBC: 242 ug/dL (ref 131–425)

## 2019-04-30 LAB — HEMOGLOBIN A1C
Est. average glucose Bld gHb Est-mCnc: 128 mg/dL
Hgb A1c MFr Bld: 6.1 % — ABNORMAL HIGH (ref 4.8–5.6)

## 2019-05-03 ENCOUNTER — Other Ambulatory Visit: Payer: Self-pay

## 2019-05-03 DIAGNOSIS — R600 Localized edema: Secondary | ICD-10-CM

## 2019-05-03 MED ORDER — SPIRONOLACTONE 25 MG PO TABS: 25.0000 mg | ORAL_TABLET | Freq: Every day | ORAL | 1 refills | Status: DC

## 2019-05-03 MED FILL — SPIRONOLACTONE 25 MG TABLET: 25 | 90 days supply | Qty: 90 | Fill #0

## 2019-05-03 MED FILL — GABAPENTIN 600 MG TABS: 600 | 30 days supply | Qty: 90 | Fill #1

## 2019-05-03 NOTE — Progress Notes (Signed)
Pt aware.

## 2019-05-05 MED FILL — OXYCODONE-APAP 10-325: 10-325 | 30 days supply | Qty: 120 | Fill #0

## 2019-05-16 ENCOUNTER — Ambulatory Visit: Payer: Medicare Other | Admitting: Physician Assistant

## 2019-05-18 MED FILL — DOXAZOSIN MESYLATE 4 MG TAB: 4 | 30 days supply | Qty: 30 | Fill #1

## 2019-05-20 MED FILL — ASPIRIN ADULT LOW STRENGTH: 81 | 42 days supply | Qty: 84 | Fill #0

## 2019-05-20 MED FILL — ONDANSETRON ODT 4 MG TABLET: 4 | 7 days supply | Qty: 20 | Fill #0

## 2019-05-20 MED FILL — ELIQUIS 5 MG TABLET: 5 | 30 days supply | Qty: 60 | Fill #2

## 2019-05-20 MED FILL — oxyCODONE HCL 5 MG TABS: 5 | 5 days supply | Qty: 30 | Fill #0

## 2019-05-20 MED FILL — PARoxetine HCL 20 MG TABS: 20 | 90 days supply | Qty: 90 | Fill #1

## 2019-05-20 MED FILL — MELOXICAM 7.5 MG TABLET: 7.5 | 30 days supply | Qty: 30 | Fill #0

## 2019-05-20 MED FILL — hydrOXYzine HCL 25 MG TABS: 25 | 15 days supply | Qty: 90 | Fill #1

## 2019-05-23 HISTORY — PX: ANKLE SURGERY: SHX546

## 2019-06-03 MED FILL — OXYCODONE-APAP 10-325: 10-325 | 30 days supply | Qty: 120 | Fill #0

## 2019-06-03 MED FILL — ROSUVASTATIN CALCIUM 40 MG: 40 | 90 days supply | Qty: 90 | Fill #1

## 2019-06-14 ENCOUNTER — Ambulatory Visit: Payer: Medicare Other | Admitting: Cardiology

## 2019-06-14 ENCOUNTER — Other Ambulatory Visit: Payer: Self-pay | Admitting: Cardiology

## 2019-06-14 MED FILL — DOXAZOSIN MESYLATE 4 MG TAB: 4 | 30 days supply | Qty: 30 | Fill #2

## 2019-06-14 MED FILL — FUROSEMIDE 40 MG TAB: 40 | 90 days supply | Qty: 90 | Fill #0

## 2019-06-16 ENCOUNTER — Telehealth: Payer: Self-pay

## 2019-06-16 MED FILL — MUPIROCIN 2% OINTMENT: 2 | 5 days supply | Qty: 22 | Fill #0

## 2019-06-16 MED FILL — tiZANidine HCL 4 MG TABS: 4 | 30 days supply | Qty: 90 | Fill #0

## 2019-06-16 NOTE — Telephone Encounter (Signed)
In view of hypertension, severe hyperlipidemia, prior stroke, tobacco use, would recommend stress testing, can  you arrange for Lexi stress, orders are already in. Do ASAP, when is her surgery

## 2019-06-16 NOTE — Telephone Encounter (Signed)
Anesthesia PA called stated that you cleared her for hip surgery, but pt had a stress test scheduled but never had it done and wants to know does she need that to be done before getting the surgery or is she ok?  [phone] Dawn Escobar

## 2019-06-17 ENCOUNTER — Ambulatory Visit (INDEPENDENT_AMBULATORY_CARE_PROVIDER_SITE_OTHER): Payer: Medicare Other | Admitting: Cardiology

## 2019-06-17 ENCOUNTER — Encounter: Payer: Self-pay | Admitting: Cardiology

## 2019-06-17 ENCOUNTER — Other Ambulatory Visit: Payer: Self-pay

## 2019-06-17 VITALS — BP 107/40 | HR 80 | Temp 96.8°F | Ht 65.0 in | Wt 210.0 lb

## 2019-06-17 DIAGNOSIS — I7300 Raynaud's syndrome without gangrene: Secondary | ICD-10-CM | POA: Diagnosis not present

## 2019-06-17 DIAGNOSIS — I480 Paroxysmal atrial fibrillation: Secondary | ICD-10-CM

## 2019-06-17 DIAGNOSIS — I10 Essential (primary) hypertension: Secondary | ICD-10-CM

## 2019-06-17 DIAGNOSIS — R0609 Other forms of dyspnea: Secondary | ICD-10-CM

## 2019-06-17 DIAGNOSIS — F17200 Nicotine dependence, unspecified, uncomplicated: Secondary | ICD-10-CM

## 2019-06-17 DIAGNOSIS — I63032 Cerebral infarction due to thrombosis of left carotid artery: Secondary | ICD-10-CM

## 2019-06-17 NOTE — Progress Notes (Signed)
Subjective:  Primary Physician:  Maudie Flakes, FNP  Patient ID: Dawn Escobar, female    DOB: [birth year], 58 y.o.   MRN: [id number]  Chief Complaint  Patient presents with  . Shortness of Breath  . Hypertension  . Follow-up    6wk   HPI: Dawn Escobar  is a 58 y.o. female  with paroxysmal atrial fibrillation, history of stroke in 2015 and had loop recorder implanted, however developed atrial fibrillation post stroke a month later and hence has been on chronic anticoagulation with Eliquis.  She has moderate obesity, hypertension, hyperlipidemia, Raynaud's disease , PTSD, tobacco use disorder presents for leg edema, hypertension. I had seen her for right hip replacement pre-op evaluation 3 months ago and had cleared her, There was a question on preop evaluation by surgical team whether she still needs a stress test.  I was planning on repeating nuclear stress test, she now presents to the office for follow-up, has had a fall and broke her right ankle and underwent surgery 05/23/2019 without any periprocedural cardiac complications.  No significant change in her chronic dyspnea.  Denies PND or orthopnea. Denies chest pain. Still smoking 1/2 pack of cigarettes. Trying to loose weight. On 04/04/2019, she presented to the emergency room with bleeding varicose veins and stopped with silver nitrate stick.  Past Medical History:  Diagnosis Date  . Anxiety   . Atrial fibrillation (HCC)   . GERD (gastroesophageal reflux disease) 04/25/2015  . Hereditary and idiopathic peripheral neuropathy 04/25/2015  . Hypercholesteremia   . Hypertension   . Migraine   . PTSD (post-traumatic stress disorder)   . Raynaud's disease   . Stroke (HCC)   . Tobacco abuse     Past Surgical History:  Procedure Laterality Date  . ABDOMINAL HYSTERECTOMY    . ANKLE SURGERY Right 05/23/2019  . HIP SURGERY  Right  . KNEE SURGERY    . LOOP RECORDER IMPLANT N/A 09/05/2014   MDT LINQ implanted by Dr Johney Frame for  cryptogenic stroke  . TEE WITHOUT CARDIOVERSION N/A 09/05/2014   Procedure: TRANSESOPHAGEAL ECHOCARDIOGRAM (TEE);  Surgeon: Wendall Stade, MD;  Location: [facility] ENDOSCOPY;  Service: Cardiovascular;  Laterality: N/A;  loop after  . WRIST SURGERY      Social History   Socioeconomic History  . Marital status: Single    Spouse name: Not on file  . Number of children: 0  . Years of education: 12th  . Highest education level: Not on file  Occupational History  . Occupation: n/a  Social Needs  . Financial resource strain: Not on file  . Food insecurity    Worry: Not on file    Inability: Not on file  . Transportation needs    Medical: Not on file    Non-medical: Not on file  Tobacco Use  . Smoking status: Current Every Day Smoker    Packs/day: 0.25    Types: Cigarettes  . Smokeless tobacco: Never Used  Substance and Sexual Activity  . Alcohol use: Yes    Alcohol/week: 1.0 standard drinks    Types: 1 Glasses of wine per week    Comment: occasionally  . Drug use: No    Types: Oxycodone    Comment: patient takes oxycodone for pain  . Sexual activity: Never    Comment: pt. reports that she believes she's going through the change; have no sex drive; hot flashes and excessive sweating for a couple of months now  Lifestyle  . Physical activity    Days  per week: Not on file    Minutes per session: Not on file  . Stress: Not on file  Relationships  . Social Musicianconnections    Talks on phone: Not on file    Gets together: Not on file    Attends religious service: Not on file    Active member of club or organization: Not on file    Attends meetings of clubs or organizations: Not on file    Relationship status: Not on file  . Intimate partner violence    Fear of current or ex partner: Not on file    Emotionally abused: Not on file    Physically abused: Not on file    Forced sexual activity: Not on file  Other Topics Concern  . Not on file  Social History Narrative   Patient lives at  home alone.   Caffeine use: 1 cup of coffee and 20 oz of soda    Current Outpatient Medications on File Prior to Visit  Medication Sig Dispense Refill  . acetaminophen (TYLENOL) 500 MG tablet Take 500 mg by mouth every 6 (six) hours as needed for mild pain, moderate pain or headache.    . Ascorbic Acid (VITAMIN C PO) Take by mouth daily.    Marland Kitchen. doxazosin (CARDURA) 4 MG tablet Take 1 tablet (4 mg total) by mouth daily. 30 tablet 2  . ELIQUIS 5 MG TABS tablet TAKE 1 TABLET BY MOUTH TWICE DAILY 180 tablet 1  . furosemide (LASIX) 40 MG tablet TAKE 1 TABLET (40 MG TOTAL) BY MOUTH DAILY AS NEEDED. 90 tablet 0  . gabapentin (NEURONTIN) 600 MG tablet Take 600 mg by mouth 2 (two) times a day.    . hydrOXYzine (VISTARIL) 25 MG capsule TAKE 1 CAPSULE BY MOUTH AT NIGHT TIME AS NEEDED 30 capsule 3  . levocetirizine (XYZAL) 5 MG tablet Take 5 mg by mouth every evening.    . nortriptyline (PAMELOR) 75 MG capsule Take 75 mg by mouth every evening.    Marland Kitchen. omeprazole (PRILOSEC) 20 MG capsule TAKE 1 CAPSULE BY MOUTH DAILY 30 capsule 0  . PARoxetine (PAXIL) 20 MG tablet Take 20 mg by mouth daily.    . rosuvastatin (CRESTOR) 40 MG tablet TAKE 1 TABLET BY MOUTH DAILY (Patient taking differently: TAKE 1 TABLET BY MOUTH DAILY IN THE EVENING) 30 tablet 11  . spironolactone (ALDACTONE) 25 MG tablet Take 1 tablet (25 mg total) by mouth daily. 90 tablet 1  . tiZANidine (ZANAFLEX) 4 MG tablet Take by mouth every 8 (eight) hours.     No current facility-administered medications on file prior to visit.    ROS:   Review of Systems  Constitution: Negative for chills, decreased appetite, malaise/fatigue and weight gain.  Cardiovascular: Positive for dyspnea on exertion and leg swelling (improved with aldactone). Negative for syncope.  Endocrine: Negative for cold intolerance.  Hematologic/Lymphatic: Does not bruise/bleed easily.  Musculoskeletal: Positive for back pain, falls (accidental fall and right ankle fracture S/P  arthroplasty) and joint pain (left hip). Negative for joint swelling.  Gastrointestinal: Negative for abdominal pain, anorexia and change in bowel habit.  Neurological: Positive for dizziness (chronic) and paresthesias. Negative for headaches and light-headedness.  Psychiatric/Behavioral: Negative for depression and substance abuse. The patient is nervous/anxious.   All other systems reviewed and are negative.     Objective:  Blood pressure (!) 107/40, pulse 80, temperature (!) 96.8 F (36 C), height 5\' 5"  (1.651 m), weight 210 lb (95.3 kg), SpO2 95 %. Body  mass index is 34.95 kg/m.   Physical Exam  Constitutional: She is oriented to person, place, and time. She appears well-developed. No distress.  Moderately obese  HENT:  Head: Atraumatic.  Eyes: Conjunctivae are normal.  Neck: Neck supple. No thyromegaly present.  Cardiovascular: Normal rate, regular rhythm, normal heart sounds, intact distal pulses and normal pulses. Exam reveals no gallop.  No murmur heard. Trace edema bilateral. Varicose veins noted. No JVD.   Pulmonary/Chest: Effort normal and breath sounds normal.  Abdominal: Soft. Bowel sounds are normal.  Musculoskeletal:     Comments: Right leg in cast  Neurological: She is alert and oriented to person, place, and time.  Skin: Skin is warm and dry.  Psychiatric: She has a normal mood and affect.   Radiology: No results found.  Laboratory examination:    Lipid profile 02/05/2018: Total cholesterol 128, triglycerides 93, HDL 53, LDL 56.  CMP Latest Ref Rng & Units 04/29/2019 01/27/2019 07/11/2016  Glucose 65 - 99 mg/dL 105(H) 146(H) 94  BUN 6 - 24 mg/dL 9 11 9   Creatinine 0.57 - 1.00 mg/dL 1.00 0.91 0.90  Sodium 134 - 144 mmol/L 138 139 139  Potassium 3.5 - 5.2 mmol/L 5.6(H) 4.8 4.5  Chloride 96 - 106 mmol/L 102 100 105  CO2 20 - 29 mmol/L 23 21 30   Calcium 8.7 - 10.2 mg/dL 9.3 9.6 9.2  Total Protein 6.0 - 8.5 g/dL - 7.4 7.1  Total Bilirubin 0.0 - 1.2 mg/dL - 0.3  0.3  Alkaline Phos 39 - 117 IU/L - 89 75  AST 0 - 40 IU/L - 15 16  ALT 0 - 32 IU/L - 12 12   CBC Latest Ref Rng & Units 07/11/2016 06/28/2016 06/27/2016  WBC 4.0 - 10.5 K/uL 7.0 4.8 8.2  Hemoglobin 12.0 - 15.0 g/dL 13.0 13.0 12.8  Hematocrit 36.0 - 46.0 % 39.0 38.4 38.5  Platelets 150.0 - 400.0 K/uL 222.0 158 160   Lipid Panel     Component Value Date/Time   CHOL 126 04/29/2019 1332   TRIG 93 04/29/2019 1332   HDL 47 04/29/2019 1332   CHOLHDL 6 06/01/2015 1208   VLDL 14.4 06/01/2015 1208   LDLCALC 60 04/29/2019 1332   HEMOGLOBIN A1C Lab Results  Component Value Date   HGBA1C 6.1 (H) 04/29/2019   MPG 123 06/28/2016   TSH No results for input(s): TSH in the last 8760 hours.  BNP    Component Value Date/Time   BNP 32.9 01/27/2019 1604    Cardiac studies:    Carotid artery duplex 09/01/2014: No significant ICA stenosis.  Echocardiogram 03/30/2019: Normal LV systolic function with EF 55%. Left ventricle cavity is normal in size. Normal global wall motion. Calculated EF 55%. Left atrial cavity is severely dilated, LA vol index 58 cc/m2. Trileaflet aortic valve. Moderate (Grade III) aortic regurgitation. Doming and hockeystick appearance of mitral valve leaflets consistent with rheumatic valve disease. No significant calcification seen. Inadequate Doppler data to estimate severity of mitral stenosis. 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 Moderate (Grade III) mitral regurgitation. Mild tricuspid regurgitation. Estimated pulmonary artery systolic pressure is 27 mmHg. Compared to previous study in 2016, mitral stenosis is new. Mitral and aortic regurgitation more prominent.   Echocardiogram 06/02/2015: Normal LV systolic function, EF 13-24% with grade 1 diastolic dysfunction.  Moderate aortic regurgitation.  Mild mitral regurgitation.  Assessment:      ICD-10-CM   1. Paroxysmal atrial fibrillation (HCC) CHA2DS2-VASc Score is 4. Yearly risk of stroke:  4%  I48.0   2. Dyspnea on exertion  R06.09   3. Raynaud's disease without gangrene  I73.00   4. Essential hypertension  I10   5. Tobacco use disorder  F17.200   4 EKG 04/22/2019: Normal sinus rhythm at rate of 92 bpm, left atrial enlargement, normal axis.  No evidence of ischemia, otherwise normal EKG.  Recommendations:    I was planning on repeating nuclear stress test, she now presents to the office for follow-up, has had a fall and broke her right ankle and underwent surgery 05/23/2019 without any periprocedural cardiac complication. She has normal EKG and no CHF and and also [facility] and feels that her CV risk for surgery is not prohibitive. In view of this in spite of patient having tobacco use disorder, hypertension and obesity as risks, she can be taken up for right hip arthroplasty with low risk overall.  Since being on Doxazocin, symptoms of Raynaud's disease has improved. Verapamil was discontinued on her prior office visit.  Blood pressure is controlled. She is now smoking 8 cigarettes a day compared to one pack of cigarettes a day.  Hold Eliquis for 2 days prior to surgery and restart post op when stable from surgical stand point. I will see her back in 6 months.   CC: Darnell LevelSlade Moore, MD (Ortho) Yates DecampJay Tykeem Lanzer, MD, [facility] System[facility]ACC 06/17/2019, 2:56 PM [facility]. [place] Pager: [phone] Office: [phone] If no answer Cell [phone]

## 2019-06-20 MED FILL — MORPHINE SULFATE IR 15 MG T: 15 | 30 days supply | Qty: 90 | Fill #0

## 2019-06-21 MED FILL — OMEPRAZOLE 20 MG CAP: 20 | 90 days supply | Qty: 90 | Fill #0

## 2019-06-22 MED FILL — NORTRIPTYLINE HCL 50 MG CAP: 50 | 90 days supply | Qty: 180 | Fill #0

## 2019-06-22 MED FILL — DOXAZOSIN MESYLATE 8 MG TAB: 8 | 90 days supply | Qty: 90 | Fill #0

## 2019-06-22 MED FILL — PARoxetine HCL 40 MG TABS: 40 | 90 days supply | Qty: 90 | Fill #0

## 2019-06-23 MED FILL — GABAPENTIN 600 MG TABS: 600 | 30 days supply | Qty: 90 | Fill #0

## 2019-06-24 MED FILL — ELIQUIS 5 MG TABLET: 5 | 30 days supply | Qty: 60 | Fill #3

## 2019-07-11 ENCOUNTER — Other Ambulatory Visit: Payer: Medicare Other

## 2019-07-11 DIAGNOSIS — Z5329 Procedure and treatment not carried out because of patient's decision for other reasons: Secondary | ICD-10-CM

## 2019-07-12 ENCOUNTER — Encounter: Payer: Self-pay | Admitting: Cardiology

## 2019-07-22 MED FILL — LIDOCAINE HCL 2% JELLY: 2 | 28 days supply | Qty: 60 | Fill #0

## 2019-07-22 MED FILL — NORTRIPTYLINE HCL 75 MG CAP: 75 | 90 days supply | Qty: 180 | Fill #0

## 2019-07-22 MED FILL — MORPHINE SULFATE IR 15 MG T: 15 | 30 days supply | Qty: 90 | Fill #0

## 2019-07-22 MED FILL — DOXAZOSIN MESYLATE 4 MG TAB: 4 | 60 days supply | Qty: 90 | Fill #0

## 2019-07-28 MED FILL — SPIRONOLACTONE 25 MG TABLET: 25 | 90 days supply | Qty: 90 | Fill #1

## 2019-07-28 MED FILL — hydrOXYzine HCL 25 MG TABS: 25 | 15 days supply | Qty: 90 | Fill #0

## 2019-07-28 MED FILL — ELIQUIS 5 MG TABLET: 5 | 30 days supply | Qty: 60 | Fill #4

## 2019-08-10 MED FILL — GABAPENTIN 600 MG TABS: 600 | 30 days supply | Qty: 90 | Fill #1

## 2019-08-11 MED FILL — tiZANidine HCL 4 MG TABS: 4 | 30 days supply | Qty: 90 | Fill #2

## 2019-08-25 MED FILL — LIDOCAINE HCL 2% JELLY: 2 | 28 days supply | Qty: 60 | Fill #1

## 2019-08-25 MED FILL — MORPHINE SULFATE IR 15 MG T: 15 | 30 days supply | Qty: 90 | Fill #0

## 2019-08-26 MED FILL — SHINGRIX 50 MCG SUS: 50 | 1 days supply | Qty: 1 | Fill #0

## 2019-08-30 MED FILL — ELIQUIS 5 MG TABLET: 5 | 30 days supply | Qty: 60 | Fill #5

## 2019-09-05 MED FILL — ROSUVASTATIN CALCIUM 40 MG: 40 | 90 days supply | Qty: 90 | Fill #0

## 2019-09-21 MED FILL — OMEPRAZOLE 20 MG CAP: 20 | 90 days supply | Qty: 90 | Fill #1

## 2019-09-23 MED FILL — ELIQUIS 5 MG TABLET: 5 | 30 days supply | Qty: 60 | Fill #0

## 2019-09-23 MED FILL — GABAPENTIN 600 MG TABLET: 600 | 30 days supply | Qty: 90 | Fill #0

## 2019-09-23 MED FILL — DOXAZOSIN MESYLATE 4 MG TAB: 4 | 60 days supply | Qty: 90 | Fill #0

## 2019-09-29 MED FILL — PREGABALIN 100 MG CAPS: 100 | 30 days supply | Qty: 90 | Fill #0

## 2019-09-29 MED FILL — MORPHINE SULFATE IR 15 MG T: 15 | 30 days supply | Qty: 90 | Fill #0

## 2019-10-11 MED FILL — hydrOXYzine HCL 25 MG TABS: 25 | 15 days supply | Qty: 90 | Fill #1

## 2019-10-11 MED FILL — PARoxetine HCL 40 MG TABS: 40 | 90 days supply | Qty: 90 | Fill #1

## 2019-10-20 ENCOUNTER — Other Ambulatory Visit: Payer: Self-pay | Admitting: Cardiology

## 2019-10-20 DIAGNOSIS — R600 Localized edema: Secondary | ICD-10-CM

## 2019-10-20 MED FILL — LIDOCAINE HCL 2% JELLY: 2 | 14 days supply | Qty: 30 | Fill #2

## 2019-10-20 MED FILL — tiZANidine HCL 4 MG TABS: 4 | 30 days supply | Qty: 90 | Fill #0

## 2019-10-20 MED FILL — ELIQUIS 5 MG TABLET: 5 | 30 days supply | Qty: 60 | Fill #1

## 2019-10-20 MED FILL — SPIRONOLACTONE 25 MG TABS: 25 | 90 days supply | Qty: 90 | Fill #0

## 2019-10-20 MED FILL — FUROSEMIDE 40 MG TAB: 40 | 90 days supply | Qty: 90 | Fill #0

## 2019-10-24 MED FILL — PENICILLIN VK 500 MG TABLET: 500 | 10 days supply | Qty: 40 | Fill #0

## 2019-10-28 MED FILL — NORTRIPTYLINE HCL 75 MG CAP: 75 | 90 days supply | Qty: 180 | Fill #0

## 2019-10-28 MED FILL — PREGABALIN 100 MG CAPS: 100 | 30 days supply | Qty: 90 | Fill #1

## 2019-11-01 MED FILL — MORPHINE SULFATE IR 15 MG T: 15 | 30 days supply | Qty: 90 | Fill #0

## 2019-11-02 MED FILL — CLINDAMYCIN HCL 300 MG CAPS: 300 | 7 days supply | Qty: 28 | Fill #0

## 2019-11-24 MED FILL — ELIQUIS 5 MG TABLET: 5 | 30 days supply | Qty: 60 | Fill #2

## 2019-11-24 MED FILL — DOXAZOSIN MESYLATE 4 MG TAB: 4 | 90 days supply | Qty: 135 | Fill #0

## 2019-11-24 MED FILL — ROSUVASTATIN CALCIUM 40 MG: 40 | 90 days supply | Qty: 90 | Fill #1

## 2019-12-09 MED FILL — MORPHINE SULFATE IR 15 MG T: 15 | 30 days supply | Qty: 90 | Fill #0

## 2019-12-15 ENCOUNTER — Ambulatory Visit: Payer: Medicare Other | Admitting: Cardiology

## 2019-12-16 MED FILL — OMEPRAZOLE 20 MG CAP: 20 | 90 days supply | Qty: 90 | Fill #0

## 2019-12-16 MED FILL — PREGABALIN 100 MG CAPS: 100 | 30 days supply | Qty: 90 | Fill #2

## 2019-12-19 MED FILL — ELIQUIS 5 MG TABLET: 5 | 30 days supply | Qty: 60 | Fill #3

## 2020-01-03 MED FILL — LIDOCAINE HCL 2% JELLY: 2 | 30 days supply | Qty: 60 | Fill #0

## 2020-01-03 MED FILL — tiZANidine HCL 4 MG TABS: 4 | 30 days supply | Qty: 90 | Fill #1

## 2020-01-03 MED FILL — hydrOXYzine HCL 25 MG TABS: 25 | 15 days supply | Qty: 90 | Fill #0

## 2020-01-16 MED FILL — MORPHINE SULFATE IR 15 MG T: 15 | 30 days supply | Qty: 90 | Fill #0

## 2020-01-18 MED FILL — FLUTICASONE PROP 50 MCG SPR: 50 | 30 days supply | Qty: 16 | Fill #0

## 2020-01-18 MED FILL — ELIQUIS 5 MG TABLET: 5 | 30 days supply | Qty: 60 | Fill #4

## 2020-01-23 MED FILL — PREGABALIN 100 MG CAPS: 100 | 30 days supply | Qty: 90 | Fill #0

## 2020-01-23 MED FILL — PARoxetine HCL 40 MG TABS: 40 | 90 days supply | Qty: 90 | Fill #0

## 2020-01-23 MED FILL — SPIRONOLACTONE 25 MG TABS: 25 | 90 days supply | Qty: 90 | Fill #1

## 2020-02-03 MED FILL — LIDOCAINE HCL 2% JELLY: 2 | 30 days supply | Qty: 90 | Fill #0

## 2020-02-07 MED FILL — tiZANidine HCL 4 MG TABS: 4 | 30 days supply | Qty: 90 | Fill #2

## 2020-02-20 MED FILL — NORTRIPTYLINE HCL 75 MG CAP: 75 | 90 days supply | Qty: 180 | Fill #0

## 2020-02-23 MED FILL — PREGABALIN 100 MG CAPS: 100 | 30 days supply | Qty: 90 | Fill #1

## 2020-02-23 MED FILL — ROSUVASTATIN CALCIUM 40 MG: 40 | 90 days supply | Qty: 90 | Fill #0

## 2020-02-23 MED FILL — DOXAZOSIN MESYLATE 4 MG TAB: 4 | 90 days supply | Qty: 135 | Fill #0

## 2020-03-01 MED FILL — ELIQUIS 5 MG TABLET: 5 | 30 days supply | Qty: 60 | Fill #5

## 2020-03-07 MED FILL — hydrOXYzine HCL 25 MG TABS: 25 | 15 days supply | Qty: 90 | Fill #1

## 2020-03-19 MED FILL — OMEPRAZOLE DR 20 MG CAPSULE: 20 | 90 days supply | Qty: 90 | Fill #1

## 2020-03-19 MED FILL — FUROSEMIDE 40 MG TAB: 40 | 90 days supply | Qty: 90 | Fill #0

## 2020-03-20 MED FILL — MORPHINE SULFATE IR 15 MG T: 15 | 30 days supply | Qty: 90 | Fill #0

## 2020-03-29 ENCOUNTER — Other Ambulatory Visit (HOSPITAL_BASED_OUTPATIENT_CLINIC_OR_DEPARTMENT_OTHER): Payer: Self-pay | Admitting: Nurse Practitioner

## 2020-03-29 MED FILL — ELIQUIS 5 MG TABLET: 5 | 30 days supply | Qty: 60 | Fill #0

## 2020-03-29 MED FILL — PREGABALIN 100 MG CAPS: 100 | 30 days supply | Qty: 90 | Fill #2

## 2020-04-10 MED FILL — MORPHINE SULFATE IR 15 MG T: 15 | 30 days supply | Qty: 120 | Fill #0

## 2020-04-18 MED FILL — PARoxetine HCL 40 MG TABS: 40 | 90 days supply | Qty: 90 | Fill #1

## 2020-04-21 ENCOUNTER — Other Ambulatory Visit (HOSPITAL_BASED_OUTPATIENT_CLINIC_OR_DEPARTMENT_OTHER): Payer: Self-pay | Admitting: Cardiology

## 2020-04-23 MED FILL — SPIRONOLACTONE 25 MG TABS: 25 | 90 days supply | Qty: 90 | Fill #0

## 2020-04-25 MED FILL — ELIQUIS 5 MG TABLET: 5 | 30 days supply | Qty: 60 | Fill #1

## 2020-04-25 MED FILL — PREGABALIN 100 MG CAPS: 100 | 90 days supply | Qty: 270 | Fill #0

## 2020-05-09 MED FILL — LIDOCAINE HCL 2% JELLY: 2 | 30 days supply | Qty: 90 | Fill #1

## 2020-05-10 MED FILL — MORPHINE SULFATE IR 15 MG T: 15 | 30 days supply | Qty: 120 | Fill #0

## 2020-05-17 MED FILL — hydrOXYzine HCL 25 MG TABS: 25 | 15 days supply | Qty: 90 | Fill #0

## 2020-05-25 MED FILL — ROSUVASTATIN CALCIUM 40 MG: 40 | 90 days supply | Qty: 90 | Fill #1

## 2020-05-25 MED FILL — ELIQUIS 5 MG TABLET: 5 | 30 days supply | Qty: 60 | Fill #2

## 2020-06-01 MED FILL — DOXAZOSIN MESYLATE 4 MG TAB: 4 | 90 days supply | Qty: 135 | Fill #1

## 2020-06-01 MED FILL — FUROSEMIDE 40 MG TAB: 40 | 90 days supply | Qty: 90 | Fill #0

## 2020-06-08 MED FILL — MORPHINE SULFATE IR 15 MG T: 15 | 30 days supply | Qty: 120 | Fill #0

## 2020-06-14 ENCOUNTER — Other Ambulatory Visit (HOSPITAL_BASED_OUTPATIENT_CLINIC_OR_DEPARTMENT_OTHER): Payer: Self-pay | Admitting: Nurse Practitioner

## 2020-06-14 MED FILL — OMEPRAZOLE 20 MG CAP: 20 | 90 days supply | Qty: 90 | Fill #0

## 2020-06-21 MED FILL — NORTRIPTYLINE HCL 75 MG CAP: 75 | 90 days supply | Qty: 180 | Fill #0

## 2020-06-21 MED FILL — ELIQUIS 5 MG TABLET: 5 | 30 days supply | Qty: 60 | Fill #3

## 2020-07-11 MED FILL — SPIRONOLACTONE 25 MG TABS: 25 | 90 days supply | Qty: 90 | Fill #1

## 2020-07-12 MED FILL — MORPHINE SULFATE IR 15 MG T: 15 | 30 days supply | Qty: 120 | Fill #0

## 2020-07-17 ENCOUNTER — Other Ambulatory Visit (HOSPITAL_BASED_OUTPATIENT_CLINIC_OR_DEPARTMENT_OTHER): Payer: Self-pay | Admitting: Family Medicine

## 2020-07-17 MED FILL — PARoxetine HCL 40 MG TABS: 40 | 90 days supply | Qty: 90 | Fill #0

## 2020-07-17 MED FILL — PREGABALIN 100 MG CAPS: 100 | 90 days supply | Qty: 270 | Fill #1

## 2020-07-25 MED FILL — ELIQUIS 5 MG TABLET: 5 | 30 days supply | Qty: 60 | Fill #4

## 2020-07-25 MED FILL — hydrOXYzine HCL 25 MG TABS: 25 | 7 days supply | Qty: 40 | Fill #0

## 2020-08-10 ENCOUNTER — Other Ambulatory Visit (HOSPITAL_BASED_OUTPATIENT_CLINIC_OR_DEPARTMENT_OTHER): Payer: Self-pay | Admitting: Nurse Practitioner

## 2020-08-10 MED FILL — MORPHINE SULFATE IR 15 MG T: 15 | 30 days supply | Qty: 120 | Fill #0

## 2020-08-14 ENCOUNTER — Other Ambulatory Visit (HOSPITAL_BASED_OUTPATIENT_CLINIC_OR_DEPARTMENT_OTHER): Payer: Self-pay | Admitting: Family Medicine

## 2020-08-14 MED FILL — DOXAZOSIN MESYLATE 8 MG TAB: 8 | 90 days supply | Qty: 90 | Fill #0

## 2020-08-14 MED FILL — VENLAFAXINE HCL ER 150 MG C: 150 | 90 days supply | Qty: 90 | Fill #0

## 2020-08-14 MED FILL — MECLIZINE 25 MG TABLET: 25 | 10 days supply | Qty: 30 | Fill #0

## 2020-08-14 MED FILL — FLUOCINONIDE 0.05% OINTMENT: 0.05 | 14 days supply | Qty: 30 | Fill #0

## 2020-08-27 ENCOUNTER — Other Ambulatory Visit (HOSPITAL_BASED_OUTPATIENT_CLINIC_OR_DEPARTMENT_OTHER): Payer: Self-pay | Admitting: Family Medicine

## 2020-08-27 MED FILL — ELIQUIS 5 MG TABLET: 5 | 30 days supply | Qty: 60 | Fill #5

## 2020-08-27 MED FILL — hydrOXYzine HCL 25 MG TABS: 25 | 7 days supply | Qty: 40 | Fill #0

## 2020-08-27 MED FILL — ROSUVASTATIN CALCIUM 40 MG: 40 | 90 days supply | Qty: 90 | Fill #0

## 2020-08-31 ENCOUNTER — Other Ambulatory Visit (HOSPITAL_BASED_OUTPATIENT_CLINIC_OR_DEPARTMENT_OTHER): Payer: Self-pay | Admitting: Family Medicine

## 2020-08-31 MED FILL — MECLIZINE 25 MG TABLET: 25 | 10 days supply | Qty: 30 | Fill #0

## 2020-09-06 ENCOUNTER — Other Ambulatory Visit (HOSPITAL_BASED_OUTPATIENT_CLINIC_OR_DEPARTMENT_OTHER): Payer: Self-pay | Admitting: Nurse Practitioner

## 2020-09-10 ENCOUNTER — Other Ambulatory Visit (HOSPITAL_BASED_OUTPATIENT_CLINIC_OR_DEPARTMENT_OTHER): Payer: Self-pay | Admitting: Family Medicine

## 2020-09-10 MED FILL — FUROSEMIDE 40 MG TAB: 40 | 90 days supply | Qty: 90 | Fill #0

## 2020-09-10 MED FILL — MORPHINE SULFATE IR 15 MG T: 15 | 30 days supply | Qty: 120 | Fill #0

## 2020-09-10 MED FILL — MECLIZINE 25 MG TABLET: 25 | 10 days supply | Qty: 30 | Fill #1

## 2020-09-11 ENCOUNTER — Other Ambulatory Visit (HOSPITAL_BASED_OUTPATIENT_CLINIC_OR_DEPARTMENT_OTHER): Payer: Self-pay | Admitting: Family Medicine

## 2020-09-11 MED FILL — OMEPRAZOLE 20 MG CAP: 20 | 90 days supply | Qty: 90 | Fill #1

## 2020-09-11 MED FILL — hydrOXYzine HCL 25 MG TABS: 25 | 7 days supply | Qty: 40 | Fill #0

## 2020-09-24 MED FILL — PREGABALIN 100 MG CAPS: 100 | 90 days supply | Qty: 270 | Fill #0

## 2020-09-25 ENCOUNTER — Other Ambulatory Visit (HOSPITAL_BASED_OUTPATIENT_CLINIC_OR_DEPARTMENT_OTHER): Payer: Self-pay | Admitting: Family Medicine

## 2020-09-25 MED FILL — DONEPEZIL HCL 5 MG TABLET: 5 | 30 days supply | Qty: 30 | Fill #0

## 2020-10-01 ENCOUNTER — Other Ambulatory Visit (HOSPITAL_BASED_OUTPATIENT_CLINIC_OR_DEPARTMENT_OTHER): Payer: Self-pay | Admitting: Nurse Practitioner

## 2020-10-01 MED FILL — NORTRIPTYLINE HCL 75 MG CAP: 75 | 90 days supply | Qty: 180 | Fill #0

## 2020-10-01 MED FILL — ELIQUIS 5 MG TABLET: 5 | 30 days supply | Qty: 60 | Fill #0

## 2020-10-11 MED FILL — MORPHINE SULFATE IR 15 MG T: 15 | 30 days supply | Qty: 120 | Fill #0

## 2020-10-11 MED FILL — SPIRONOLACTONE 25 MG TABS: 25 | 90 days supply | Qty: 90 | Fill #2

## 2020-10-17 ENCOUNTER — Other Ambulatory Visit (HOSPITAL_BASED_OUTPATIENT_CLINIC_OR_DEPARTMENT_OTHER): Payer: Self-pay | Admitting: Family Medicine

## 2020-10-17 MED FILL — CLINDAMYCIN HCL 300 MG CAP: 300 | 7 days supply | Qty: 28 | Fill #0

## 2020-10-19 MED FILL — DONEPEZIL HCL 5 MG TABLET: 5 | 30 days supply | Qty: 30 | Fill #1

## 2020-10-19 MED FILL — MECLIZINE 25 MG TABLET: 25 | 10 days supply | Qty: 30 | Fill #2

## 2020-10-26 MED FILL — ELIQUIS 5 MG TABLET: 5 | 30 days supply | Qty: 60 | Fill #1

## 2020-10-29 ENCOUNTER — Other Ambulatory Visit (HOSPITAL_BASED_OUTPATIENT_CLINIC_OR_DEPARTMENT_OTHER): Payer: Self-pay | Admitting: Family Medicine

## 2020-10-29 MED FILL — DONEPEZIL HCL 10 MG TABLET: 10 | 30 days supply | Qty: 30 | Fill #0

## 2020-10-29 MED FILL — MECLIZINE 25 MG TABLET: 25 | 30 days supply | Qty: 60 | Fill #0

## 2020-10-30 MED FILL — hydrOXYzine HCL 25 MG TABS: 25 | 7 days supply | Qty: 40 | Fill #0

## 2020-11-02 ENCOUNTER — Other Ambulatory Visit (HOSPITAL_BASED_OUTPATIENT_CLINIC_OR_DEPARTMENT_OTHER): Payer: Self-pay | Admitting: Family Medicine

## 2020-11-02 MED FILL — DOXAZOSIN MESYLATE 8 MG TAB: 8 | 90 days supply | Qty: 90 | Fill #1

## 2020-11-02 MED FILL — VENLAFAXINE HCL ER 150 MG C: 150 | 90 days supply | Qty: 90 | Fill #0

## 2020-11-05 MED FILL — hydrOXYzine HCL 25 MG TABS: 25 | 7 days supply | Qty: 40 | Fill #0

## 2020-11-09 MED FILL — MORPHINE SULFATE IR 15 MG T: 15 | 30 days supply | Qty: 120 | Fill #0

## 2020-11-21 MED FILL — ROSUVASTATIN CALCIUM 40 MG: 40 | 90 days supply | Qty: 90 | Fill #1

## 2020-11-21 MED FILL — ELIQUIS 5 MG TABLET: 5 | 30 days supply | Qty: 60 | Fill #2

## 2020-11-28 ENCOUNTER — Other Ambulatory Visit (HOSPITAL_BASED_OUTPATIENT_CLINIC_OR_DEPARTMENT_OTHER): Payer: Self-pay | Admitting: Family Medicine

## 2020-11-28 MED FILL — FUROSEMIDE 40 MG TAB: 40 | 90 days supply | Qty: 90 | Fill #0

## 2020-12-06 ENCOUNTER — Other Ambulatory Visit (HOSPITAL_BASED_OUTPATIENT_CLINIC_OR_DEPARTMENT_OTHER): Payer: Self-pay | Admitting: Family Medicine

## 2020-12-06 MED FILL — OMEPRAZOLE 20 MG CAP: 20 | 90 days supply | Qty: 90 | Fill #0

## 2020-12-06 MED FILL — DONEPEZIL HCL 10 MG TABLET: 10 | 30 days supply | Qty: 30 | Fill #1

## 2020-12-07 ENCOUNTER — Other Ambulatory Visit (HOSPITAL_BASED_OUTPATIENT_CLINIC_OR_DEPARTMENT_OTHER): Payer: Self-pay | Admitting: Nurse Practitioner

## 2020-12-07 MED FILL — MORPHINE SULFATE IR 15 MG T: 15 | 30 days supply | Qty: 120 | Fill #0

## 2020-12-07 MED FILL — methylPREDNISolone 4 MG dos: 4 | 6 days supply | Qty: 21 | Fill #0

## 2020-12-10 MED FILL — NORTRIPTYLINE HCL 75 MG CAP: 75 | 90 days supply | Qty: 180 | Fill #0

## 2020-12-26 MED FILL — ELIQUIS 5 MG TABLET: 5 | 30 days supply | Qty: 60 | Fill #3

## 2020-12-27 ENCOUNTER — Other Ambulatory Visit (HOSPITAL_BASED_OUTPATIENT_CLINIC_OR_DEPARTMENT_OTHER): Payer: Self-pay | Admitting: Medical

## 2020-12-27 MED FILL — FLUTICASONE PROP 50 MCG SPR: 50 | 30 days supply | Qty: 16 | Fill #0

## 2021-01-03 MED FILL — DONEPEZIL HCL 10 MG TABLET: 10 | 30 days supply | Qty: 30 | Fill #2

## 2021-01-03 MED FILL — MECLIZINE 25 MG TABLET: 25 | 30 days supply | Qty: 60 | Fill #1

## 2021-01-05 ENCOUNTER — Other Ambulatory Visit (HOSPITAL_BASED_OUTPATIENT_CLINIC_OR_DEPARTMENT_OTHER): Payer: Self-pay

## 2021-01-05 MED FILL — Spironolactone Tab 25 MG: ORAL | 90 days supply | Qty: 90 | Fill #0 | Status: AC

## 2021-01-05 MED FILL — Pregabalin Cap 100 MG: ORAL | 90 days supply | Qty: 270 | Fill #0 | Status: AC

## 2021-01-07 ENCOUNTER — Other Ambulatory Visit (HOSPITAL_BASED_OUTPATIENT_CLINIC_OR_DEPARTMENT_OTHER): Payer: Self-pay

## 2021-01-07 MED ORDER — HYDROXYZINE HCL 25 MG PO TABS
ORAL_TABLET | ORAL | 1 refills | Status: DC
  Filled 2021-01-07 – 2021-01-18 (×2): qty 60, 10d supply, fill #0
  Filled 2021-03-09: qty 60, 10d supply, fill #1

## 2021-01-09 ENCOUNTER — Other Ambulatory Visit (HOSPITAL_BASED_OUTPATIENT_CLINIC_OR_DEPARTMENT_OTHER): Payer: Self-pay

## 2021-01-09 MED FILL — Morphine Sulfate Tab 15 MG: ORAL | 30 days supply | Qty: 120 | Fill #0 | Status: AC

## 2021-01-11 ENCOUNTER — Other Ambulatory Visit (HOSPITAL_BASED_OUTPATIENT_CLINIC_OR_DEPARTMENT_OTHER): Payer: Self-pay

## 2021-01-17 ENCOUNTER — Other Ambulatory Visit (HOSPITAL_BASED_OUTPATIENT_CLINIC_OR_DEPARTMENT_OTHER): Payer: Self-pay

## 2021-01-18 ENCOUNTER — Other Ambulatory Visit (HOSPITAL_BASED_OUTPATIENT_CLINIC_OR_DEPARTMENT_OTHER): Payer: Self-pay

## 2021-01-21 ENCOUNTER — Other Ambulatory Visit (HOSPITAL_BASED_OUTPATIENT_CLINIC_OR_DEPARTMENT_OTHER): Payer: Self-pay

## 2021-01-21 MED ORDER — HYDROXYZINE HCL 25 MG PO TABS
ORAL_TABLET | ORAL | 1 refills | Status: DC
  Filled 2021-01-21 – 2021-12-09 (×3): qty 60, 10d supply, fill #0
  Filled 2022-01-12: qty 60, 10d supply, fill #1

## 2021-01-23 MED FILL — Apixaban Tab 5 MG: ORAL | 30 days supply | Qty: 60 | Fill #0 | Status: AC

## 2021-01-24 ENCOUNTER — Other Ambulatory Visit (HOSPITAL_BASED_OUTPATIENT_CLINIC_OR_DEPARTMENT_OTHER): Payer: Self-pay

## 2021-01-28 ENCOUNTER — Other Ambulatory Visit (HOSPITAL_BASED_OUTPATIENT_CLINIC_OR_DEPARTMENT_OTHER): Payer: Self-pay

## 2021-01-29 ENCOUNTER — Other Ambulatory Visit (HOSPITAL_BASED_OUTPATIENT_CLINIC_OR_DEPARTMENT_OTHER): Payer: Self-pay

## 2021-01-30 ENCOUNTER — Other Ambulatory Visit (HOSPITAL_BASED_OUTPATIENT_CLINIC_OR_DEPARTMENT_OTHER): Payer: Self-pay

## 2021-01-30 MED ORDER — VENLAFAXINE HCL ER 150 MG PO CP24
ORAL_CAPSULE | ORAL | 0 refills | Status: DC
  Filled 2021-01-30 – 2021-02-11 (×2): qty 90, 90d supply, fill #0

## 2021-01-30 MED ORDER — DOXAZOSIN MESYLATE 8 MG PO TABS
ORAL_TABLET | ORAL | 1 refills | Status: DC
  Filled 2021-01-30 – 2021-02-11 (×2): qty 90, 90d supply, fill #0
  Filled 2021-05-05: qty 90, 90d supply, fill #1

## 2021-01-31 ENCOUNTER — Other Ambulatory Visit (HOSPITAL_BASED_OUTPATIENT_CLINIC_OR_DEPARTMENT_OTHER): Payer: Self-pay

## 2021-02-05 ENCOUNTER — Other Ambulatory Visit (HOSPITAL_BASED_OUTPATIENT_CLINIC_OR_DEPARTMENT_OTHER): Payer: Self-pay

## 2021-02-05 MED FILL — Morphine Sulfate Tab 15 MG: ORAL | 30 days supply | Qty: 120 | Fill #0 | Status: AC

## 2021-02-05 MED FILL — Donepezil Hydrochloride Tab 10 MG: ORAL | 30 days supply | Qty: 30 | Fill #0 | Status: AC

## 2021-02-06 ENCOUNTER — Other Ambulatory Visit (HOSPITAL_BASED_OUTPATIENT_CLINIC_OR_DEPARTMENT_OTHER): Payer: Self-pay

## 2021-02-07 ENCOUNTER — Other Ambulatory Visit (HOSPITAL_BASED_OUTPATIENT_CLINIC_OR_DEPARTMENT_OTHER): Payer: Self-pay

## 2021-02-08 ENCOUNTER — Other Ambulatory Visit (HOSPITAL_BASED_OUTPATIENT_CLINIC_OR_DEPARTMENT_OTHER): Payer: Self-pay

## 2021-02-11 ENCOUNTER — Other Ambulatory Visit (HOSPITAL_BASED_OUTPATIENT_CLINIC_OR_DEPARTMENT_OTHER): Payer: Self-pay

## 2021-02-15 ENCOUNTER — Other Ambulatory Visit (HOSPITAL_BASED_OUTPATIENT_CLINIC_OR_DEPARTMENT_OTHER): Payer: Self-pay

## 2021-02-19 ENCOUNTER — Other Ambulatory Visit (HOSPITAL_BASED_OUTPATIENT_CLINIC_OR_DEPARTMENT_OTHER): Payer: Self-pay

## 2021-02-19 MED ORDER — ALBUTEROL SULFATE HFA 108 (90 BASE) MCG/ACT IN AERS
INHALATION_SPRAY | RESPIRATORY_TRACT | 2 refills | Status: AC
  Filled 2021-02-19: qty 18, 16d supply, fill #0
  Filled 2021-06-04: qty 18, 16d supply, fill #1
  Filled 2021-11-11: qty 18, 16d supply, fill #2

## 2021-02-19 MED ORDER — BENZONATATE 200 MG PO CAPS
ORAL_CAPSULE | ORAL | 1 refills | Status: AC
  Filled 2021-02-19: qty 30, 10d supply, fill #0

## 2021-02-19 MED ORDER — MORPHINE SULFATE 15 MG PO TABS
ORAL_TABLET | ORAL | 0 refills | Status: DC
  Filled 2021-04-11: qty 120, 30d supply, fill #0

## 2021-02-19 MED ORDER — MORPHINE SULFATE 15 MG PO TABS
ORAL_TABLET | ORAL | 0 refills | Status: DC
  Filled 2021-03-11: qty 120, 30d supply, fill #0

## 2021-02-19 MED ORDER — PREDNISONE 10 MG PO TABS
ORAL_TABLET | ORAL | 0 refills | Status: DC
  Filled 2021-02-19: qty 20, 10d supply, fill #0

## 2021-02-19 MED ORDER — AMOXICILLIN 875 MG PO TABS
ORAL_TABLET | ORAL | 0 refills | Status: AC
  Filled 2021-02-19: qty 20, 10d supply, fill #0

## 2021-02-24 MED FILL — Apixaban Tab 5 MG: ORAL | 30 days supply | Qty: 60 | Fill #1 | Status: AC

## 2021-02-25 ENCOUNTER — Other Ambulatory Visit (HOSPITAL_BASED_OUTPATIENT_CLINIC_OR_DEPARTMENT_OTHER): Payer: Self-pay

## 2021-02-26 ENCOUNTER — Other Ambulatory Visit (HOSPITAL_BASED_OUTPATIENT_CLINIC_OR_DEPARTMENT_OTHER): Payer: Self-pay

## 2021-02-26 MED ORDER — ROSUVASTATIN CALCIUM 40 MG PO TABS
40.0000 mg | ORAL_TABLET | Freq: Every day | ORAL | 1 refills | Status: DC
  Filled 2021-02-26: qty 90, 90d supply, fill #0
  Filled 2021-05-22: qty 90, 90d supply, fill #1

## 2021-02-26 MED ORDER — NORTRIPTYLINE HCL 75 MG PO CAPS
150.0000 mg | ORAL_CAPSULE | Freq: Every day | ORAL | 1 refills | Status: DC
  Filled 2021-02-26: qty 180, 90d supply, fill #0
  Filled 2021-06-25: qty 180, 90d supply, fill #1

## 2021-03-08 ENCOUNTER — Other Ambulatory Visit (HOSPITAL_BASED_OUTPATIENT_CLINIC_OR_DEPARTMENT_OTHER): Payer: Self-pay

## 2021-03-09 ENCOUNTER — Other Ambulatory Visit (HOSPITAL_BASED_OUTPATIENT_CLINIC_OR_DEPARTMENT_OTHER): Payer: Self-pay

## 2021-03-09 MED FILL — Omeprazole Cap Delayed Release 20 MG: ORAL | 90 days supply | Qty: 90 | Fill #0 | Status: AC

## 2021-03-09 MED FILL — Meclizine HCl Tab 25 MG: ORAL | 30 days supply | Qty: 60 | Fill #0 | Status: AC

## 2021-03-11 ENCOUNTER — Other Ambulatory Visit (HOSPITAL_BASED_OUTPATIENT_CLINIC_OR_DEPARTMENT_OTHER): Payer: Self-pay

## 2021-03-13 ENCOUNTER — Other Ambulatory Visit (HOSPITAL_BASED_OUTPATIENT_CLINIC_OR_DEPARTMENT_OTHER): Payer: Self-pay

## 2021-03-13 MED ORDER — FUROSEMIDE 40 MG PO TABS
40.0000 mg | ORAL_TABLET | Freq: Every day | ORAL | 0 refills | Status: DC | PRN
  Filled 2021-03-13: qty 90, 90d supply, fill #0

## 2021-03-13 MED ORDER — DONEPEZIL HCL 10 MG PO TABS
ORAL_TABLET | ORAL | 3 refills | Status: DC
  Filled 2021-03-13: qty 30, 30d supply, fill #0
  Filled 2021-04-08: qty 30, 30d supply, fill #1
  Filled 2021-05-05: qty 30, 30d supply, fill #2
  Filled 2021-06-02: qty 30, 30d supply, fill #3

## 2021-03-15 ENCOUNTER — Other Ambulatory Visit (HOSPITAL_BASED_OUTPATIENT_CLINIC_OR_DEPARTMENT_OTHER): Payer: Self-pay

## 2021-03-15 MED FILL — Fluticasone Propionate Nasal Susp 50 MCG/ACT: NASAL | 30 days supply | Qty: 16 | Fill #0 | Status: AC

## 2021-03-19 ENCOUNTER — Other Ambulatory Visit (HOSPITAL_BASED_OUTPATIENT_CLINIC_OR_DEPARTMENT_OTHER): Payer: Self-pay

## 2021-03-20 ENCOUNTER — Other Ambulatory Visit (HOSPITAL_BASED_OUTPATIENT_CLINIC_OR_DEPARTMENT_OTHER): Payer: Self-pay

## 2021-03-21 ENCOUNTER — Other Ambulatory Visit (HOSPITAL_BASED_OUTPATIENT_CLINIC_OR_DEPARTMENT_OTHER): Payer: Self-pay

## 2021-03-21 MED ORDER — ELIQUIS 5 MG PO TABS
1.0000 | ORAL_TABLET | Freq: Two times a day (BID) | ORAL | 2 refills | Status: DC
  Filled 2021-03-21: qty 60, 30d supply, fill #0
  Filled 2021-04-22: qty 60, 30d supply, fill #1
  Filled 2021-05-22: qty 60, 30d supply, fill #2

## 2021-03-29 ENCOUNTER — Other Ambulatory Visit (HOSPITAL_BASED_OUTPATIENT_CLINIC_OR_DEPARTMENT_OTHER): Payer: Self-pay

## 2021-04-08 ENCOUNTER — Other Ambulatory Visit (HOSPITAL_BASED_OUTPATIENT_CLINIC_OR_DEPARTMENT_OTHER): Payer: Self-pay

## 2021-04-09 ENCOUNTER — Other Ambulatory Visit (HOSPITAL_BASED_OUTPATIENT_CLINIC_OR_DEPARTMENT_OTHER): Payer: Self-pay

## 2021-04-10 ENCOUNTER — Other Ambulatory Visit (HOSPITAL_BASED_OUTPATIENT_CLINIC_OR_DEPARTMENT_OTHER): Payer: Self-pay

## 2021-04-11 ENCOUNTER — Other Ambulatory Visit (HOSPITAL_BASED_OUTPATIENT_CLINIC_OR_DEPARTMENT_OTHER): Payer: Self-pay

## 2021-04-15 ENCOUNTER — Other Ambulatory Visit (HOSPITAL_BASED_OUTPATIENT_CLINIC_OR_DEPARTMENT_OTHER): Payer: Self-pay

## 2021-04-16 ENCOUNTER — Other Ambulatory Visit (HOSPITAL_BASED_OUTPATIENT_CLINIC_OR_DEPARTMENT_OTHER): Payer: Self-pay

## 2021-04-22 ENCOUNTER — Other Ambulatory Visit (HOSPITAL_BASED_OUTPATIENT_CLINIC_OR_DEPARTMENT_OTHER): Payer: Self-pay

## 2021-04-23 ENCOUNTER — Other Ambulatory Visit (HOSPITAL_BASED_OUTPATIENT_CLINIC_OR_DEPARTMENT_OTHER): Payer: Self-pay

## 2021-04-25 ENCOUNTER — Other Ambulatory Visit (HOSPITAL_BASED_OUTPATIENT_CLINIC_OR_DEPARTMENT_OTHER): Payer: Self-pay

## 2021-04-25 MED ORDER — SPIRONOLACTONE 25 MG PO TABS
ORAL_TABLET | ORAL | 3 refills | Status: DC
  Filled 2021-04-25: qty 90, 90d supply, fill #0
  Filled 2021-07-15: qty 90, 90d supply, fill #1
  Filled 2021-10-11: qty 90, 90d supply, fill #2
  Filled 2022-01-01: qty 90, 90d supply, fill #3

## 2021-04-26 ENCOUNTER — Other Ambulatory Visit (HOSPITAL_BASED_OUTPATIENT_CLINIC_OR_DEPARTMENT_OTHER): Payer: Self-pay

## 2021-04-29 ENCOUNTER — Other Ambulatory Visit (HOSPITAL_BASED_OUTPATIENT_CLINIC_OR_DEPARTMENT_OTHER): Payer: Self-pay

## 2021-04-30 ENCOUNTER — Other Ambulatory Visit (HOSPITAL_BASED_OUTPATIENT_CLINIC_OR_DEPARTMENT_OTHER): Payer: Self-pay

## 2021-05-01 ENCOUNTER — Other Ambulatory Visit (HOSPITAL_BASED_OUTPATIENT_CLINIC_OR_DEPARTMENT_OTHER): Payer: Self-pay

## 2021-05-01 MED ORDER — PREGABALIN 100 MG PO CAPS
ORAL_CAPSULE | ORAL | 1 refills | Status: DC
  Filled 2021-05-01: qty 270, 90d supply, fill #0
  Filled 2021-07-23: qty 270, 90d supply, fill #1

## 2021-05-02 ENCOUNTER — Other Ambulatory Visit (HOSPITAL_BASED_OUTPATIENT_CLINIC_OR_DEPARTMENT_OTHER): Payer: Self-pay

## 2021-05-05 ENCOUNTER — Other Ambulatory Visit (HOSPITAL_BASED_OUTPATIENT_CLINIC_OR_DEPARTMENT_OTHER): Payer: Self-pay

## 2021-05-05 MED FILL — Meclizine HCl Tab 25 MG: ORAL | 30 days supply | Qty: 60 | Fill #1 | Status: AC

## 2021-05-06 ENCOUNTER — Other Ambulatory Visit (HOSPITAL_BASED_OUTPATIENT_CLINIC_OR_DEPARTMENT_OTHER): Payer: Self-pay

## 2021-05-07 ENCOUNTER — Other Ambulatory Visit (HOSPITAL_BASED_OUTPATIENT_CLINIC_OR_DEPARTMENT_OTHER): Payer: Self-pay

## 2021-05-07 MED ORDER — VENLAFAXINE HCL ER 150 MG PO CP24
ORAL_CAPSULE | ORAL | 0 refills | Status: DC
  Filled 2021-05-07: qty 90, 90d supply, fill #0

## 2021-05-07 MED ORDER — HYDROXYZINE HCL 25 MG PO TABS
ORAL_TABLET | ORAL | 1 refills | Status: DC
  Filled 2021-05-07: qty 60, 10d supply, fill #0
  Filled 2021-06-25: qty 60, 10d supply, fill #1

## 2021-05-10 ENCOUNTER — Other Ambulatory Visit (HOSPITAL_BASED_OUTPATIENT_CLINIC_OR_DEPARTMENT_OTHER): Payer: Self-pay

## 2021-05-10 MED ORDER — MORPHINE SULFATE 15 MG PO TABS
ORAL_TABLET | ORAL | 0 refills | Status: DC
  Filled 2021-05-10: qty 120, 30d supply, fill #0

## 2021-05-22 ENCOUNTER — Other Ambulatory Visit (HOSPITAL_BASED_OUTPATIENT_CLINIC_OR_DEPARTMENT_OTHER): Payer: Self-pay

## 2021-05-23 ENCOUNTER — Other Ambulatory Visit (HOSPITAL_BASED_OUTPATIENT_CLINIC_OR_DEPARTMENT_OTHER): Payer: Self-pay

## 2021-05-23 MED ORDER — FUROSEMIDE 40 MG PO TABS
ORAL_TABLET | ORAL | 0 refills | Status: DC
  Filled 2021-05-23: qty 90, 90d supply, fill #0

## 2021-05-24 ENCOUNTER — Other Ambulatory Visit (HOSPITAL_BASED_OUTPATIENT_CLINIC_OR_DEPARTMENT_OTHER): Payer: Self-pay

## 2021-05-26 ENCOUNTER — Other Ambulatory Visit (HOSPITAL_BASED_OUTPATIENT_CLINIC_OR_DEPARTMENT_OTHER): Payer: Self-pay

## 2021-05-28 ENCOUNTER — Other Ambulatory Visit (HOSPITAL_BASED_OUTPATIENT_CLINIC_OR_DEPARTMENT_OTHER): Payer: Self-pay

## 2021-05-28 MED ORDER — OMEPRAZOLE 20 MG PO CPDR
20.0000 mg | DELAYED_RELEASE_CAPSULE | Freq: Every day | ORAL | 0 refills | Status: DC
  Filled 2021-05-28: qty 90, 90d supply, fill #0

## 2021-05-30 MED FILL — Fluticasone Propionate Nasal Susp 50 MCG/ACT: NASAL | 30 days supply | Qty: 16 | Fill #1 | Status: AC

## 2021-05-31 ENCOUNTER — Other Ambulatory Visit (HOSPITAL_BASED_OUTPATIENT_CLINIC_OR_DEPARTMENT_OTHER): Payer: Self-pay

## 2021-06-03 ENCOUNTER — Other Ambulatory Visit (HOSPITAL_BASED_OUTPATIENT_CLINIC_OR_DEPARTMENT_OTHER): Payer: Self-pay

## 2021-06-04 ENCOUNTER — Other Ambulatory Visit (HOSPITAL_BASED_OUTPATIENT_CLINIC_OR_DEPARTMENT_OTHER): Payer: Self-pay

## 2021-06-07 ENCOUNTER — Other Ambulatory Visit (HOSPITAL_BASED_OUTPATIENT_CLINIC_OR_DEPARTMENT_OTHER): Payer: Self-pay

## 2021-06-07 MED ORDER — MORPHINE SULFATE 15 MG PO TABS
ORAL_TABLET | ORAL | 0 refills | Status: DC
  Filled 2021-06-07: qty 120, 30d supply, fill #0

## 2021-06-17 ENCOUNTER — Other Ambulatory Visit (HOSPITAL_BASED_OUTPATIENT_CLINIC_OR_DEPARTMENT_OTHER): Payer: Self-pay

## 2021-06-17 MED ORDER — MORPHINE SULFATE 15 MG PO TABS
ORAL_TABLET | ORAL | 0 refills | Status: DC
  Filled 2021-07-10: qty 120, 30d supply, fill #0

## 2021-06-17 MED ORDER — MORPHINE SULFATE 15 MG PO TABS
ORAL_TABLET | ORAL | 0 refills | Status: DC
  Filled 2021-08-13: qty 120, 30d supply, fill #0

## 2021-06-17 MED ORDER — MORPHINE SULFATE 15 MG PO TABS
ORAL_TABLET | ORAL | 0 refills | Status: DC
  Filled 2021-09-11: qty 120, 30d supply, fill #0

## 2021-06-25 ENCOUNTER — Other Ambulatory Visit (HOSPITAL_BASED_OUTPATIENT_CLINIC_OR_DEPARTMENT_OTHER): Payer: Self-pay

## 2021-06-26 ENCOUNTER — Other Ambulatory Visit (HOSPITAL_BASED_OUTPATIENT_CLINIC_OR_DEPARTMENT_OTHER): Payer: Self-pay

## 2021-06-27 ENCOUNTER — Other Ambulatory Visit (HOSPITAL_BASED_OUTPATIENT_CLINIC_OR_DEPARTMENT_OTHER): Payer: Self-pay

## 2021-06-27 MED ORDER — ELIQUIS 5 MG PO TABS
5.0000 mg | ORAL_TABLET | Freq: Two times a day (BID) | ORAL | 0 refills | Status: DC
  Filled 2021-06-27: qty 60, 30d supply, fill #0

## 2021-06-28 ENCOUNTER — Other Ambulatory Visit (HOSPITAL_BASED_OUTPATIENT_CLINIC_OR_DEPARTMENT_OTHER): Payer: Self-pay

## 2021-06-28 MED ORDER — MECLIZINE HCL 25 MG PO TABS
ORAL_TABLET | ORAL | 3 refills | Status: DC
  Filled 2021-06-28 – 2021-07-01 (×2): qty 60, 30d supply, fill #0
  Filled 2021-09-01: qty 60, 30d supply, fill #1
  Filled 2021-10-27: qty 60, 30d supply, fill #2
  Filled 2022-01-01: qty 60, 30d supply, fill #3

## 2021-07-01 ENCOUNTER — Other Ambulatory Visit (HOSPITAL_BASED_OUTPATIENT_CLINIC_OR_DEPARTMENT_OTHER): Payer: Self-pay

## 2021-07-02 ENCOUNTER — Other Ambulatory Visit (HOSPITAL_BASED_OUTPATIENT_CLINIC_OR_DEPARTMENT_OTHER): Payer: Self-pay

## 2021-07-02 MED ORDER — DONEPEZIL HCL 10 MG PO TABS
ORAL_TABLET | ORAL | 3 refills | Status: DC
  Filled 2021-07-02: qty 30, 30d supply, fill #0
  Filled 2021-08-01: qty 30, 30d supply, fill #1
  Filled 2021-08-27: qty 30, 30d supply, fill #2
  Filled 2021-09-29: qty 30, 30d supply, fill #3

## 2021-07-10 ENCOUNTER — Other Ambulatory Visit (HOSPITAL_BASED_OUTPATIENT_CLINIC_OR_DEPARTMENT_OTHER): Payer: Self-pay

## 2021-07-10 MED ORDER — FERROUS SULFATE 325 (65 FE) MG PO TABS
ORAL_TABLET | ORAL | 1 refills | Status: DC
  Filled 2021-07-10: qty 100, 100d supply, fill #0
  Filled 2021-11-11: qty 100, 100d supply, fill #1

## 2021-07-11 ENCOUNTER — Other Ambulatory Visit (HOSPITAL_BASED_OUTPATIENT_CLINIC_OR_DEPARTMENT_OTHER): Payer: Self-pay

## 2021-07-12 ENCOUNTER — Other Ambulatory Visit (HOSPITAL_BASED_OUTPATIENT_CLINIC_OR_DEPARTMENT_OTHER): Payer: Self-pay

## 2021-07-12 MED ORDER — LIDOCAINE HCL URETHRAL/MUCOSAL 2 % EX GEL
CUTANEOUS | 1 refills | Status: DC
  Filled 2021-07-12: qty 90, 30d supply, fill #0

## 2021-07-16 ENCOUNTER — Other Ambulatory Visit (HOSPITAL_BASED_OUTPATIENT_CLINIC_OR_DEPARTMENT_OTHER): Payer: Self-pay

## 2021-07-23 ENCOUNTER — Other Ambulatory Visit (HOSPITAL_BASED_OUTPATIENT_CLINIC_OR_DEPARTMENT_OTHER): Payer: Self-pay

## 2021-07-24 ENCOUNTER — Other Ambulatory Visit (HOSPITAL_BASED_OUTPATIENT_CLINIC_OR_DEPARTMENT_OTHER): Payer: Self-pay

## 2021-07-25 ENCOUNTER — Other Ambulatory Visit (HOSPITAL_BASED_OUTPATIENT_CLINIC_OR_DEPARTMENT_OTHER): Payer: Self-pay

## 2021-07-26 ENCOUNTER — Other Ambulatory Visit (HOSPITAL_BASED_OUTPATIENT_CLINIC_OR_DEPARTMENT_OTHER): Payer: Self-pay

## 2021-07-26 MED ORDER — ELIQUIS 5 MG PO TABS
5.0000 mg | ORAL_TABLET | Freq: Two times a day (BID) | ORAL | 0 refills | Status: DC
  Filled 2021-07-26: qty 60, 30d supply, fill #0

## 2021-08-01 ENCOUNTER — Other Ambulatory Visit (HOSPITAL_BASED_OUTPATIENT_CLINIC_OR_DEPARTMENT_OTHER): Payer: Self-pay

## 2021-08-02 ENCOUNTER — Other Ambulatory Visit (HOSPITAL_BASED_OUTPATIENT_CLINIC_OR_DEPARTMENT_OTHER): Payer: Self-pay

## 2021-08-05 ENCOUNTER — Other Ambulatory Visit (HOSPITAL_BASED_OUTPATIENT_CLINIC_OR_DEPARTMENT_OTHER): Payer: Self-pay

## 2021-08-05 MED ORDER — DOXAZOSIN MESYLATE 8 MG PO TABS
ORAL_TABLET | ORAL | 1 refills | Status: DC
  Filled 2021-08-05: qty 90, 90d supply, fill #0
  Filled 2021-10-27: qty 90, 90d supply, fill #1

## 2021-08-06 ENCOUNTER — Other Ambulatory Visit (HOSPITAL_BASED_OUTPATIENT_CLINIC_OR_DEPARTMENT_OTHER): Payer: Self-pay

## 2021-08-06 MED ORDER — VENLAFAXINE HCL ER 150 MG PO CP24
150.0000 mg | ORAL_CAPSULE | Freq: Every day | ORAL | 0 refills | Status: DC
  Filled 2021-08-06: qty 90, 90d supply, fill #0

## 2021-08-13 ENCOUNTER — Other Ambulatory Visit (HOSPITAL_BASED_OUTPATIENT_CLINIC_OR_DEPARTMENT_OTHER): Payer: Self-pay

## 2021-08-14 ENCOUNTER — Ambulatory Visit: Payer: Medicare HMO | Attending: Internal Medicine

## 2021-08-14 DIAGNOSIS — Z23 Encounter for immunization: Secondary | ICD-10-CM

## 2021-08-14 NOTE — Progress Notes (Signed)
   Covid-19 Vaccination Clinic  Name:  Dawn Escobar    MRN: [id number] DOB: May 28, 1961  08/14/2021  Dawn Escobar was observed post Covid-19 immunization for 15 minutes without incident. She was provided with Vaccine Information Sheet and instruction to access the V-Safe system.   Dawn Escobar was instructed to call [phone] with any severe reactions post vaccine: Difficulty breathing  Swelling of face and throat  A fast heartbeat  A bad rash all over body  Dizziness and weakness   Immunizations Administered     Name Date Dose VIS Date Route   Pfizer Covid-19 Vaccine Bivalent Booster 08/14/2021  2:28 PM 0.3 mL 06/05/2021 Intramuscular   Manufacturer: [employer], [employer]   Lot: [id number]   NDC: [address]-0      Drusilla Kanner, PharmD, MBA Clinical Acute Care Pharmacist

## 2021-08-15 ENCOUNTER — Other Ambulatory Visit (HOSPITAL_BASED_OUTPATIENT_CLINIC_OR_DEPARTMENT_OTHER): Payer: Self-pay

## 2021-08-15 MED ORDER — HYDROXYZINE HCL 25 MG PO TABS
ORAL_TABLET | ORAL | 1 refills | Status: DC
  Filled 2021-08-15: qty 60, 10d supply, fill #0
  Filled 2021-09-12: qty 60, 10d supply, fill #1

## 2021-08-18 ENCOUNTER — Other Ambulatory Visit (HOSPITAL_BASED_OUTPATIENT_CLINIC_OR_DEPARTMENT_OTHER): Payer: Self-pay

## 2021-08-20 ENCOUNTER — Other Ambulatory Visit (HOSPITAL_BASED_OUTPATIENT_CLINIC_OR_DEPARTMENT_OTHER): Payer: Self-pay

## 2021-08-20 MED ORDER — ROSUVASTATIN CALCIUM 40 MG PO TABS
40.0000 mg | ORAL_TABLET | Freq: Every day | ORAL | 1 refills | Status: DC
  Filled 2021-08-20: qty 90, 90d supply, fill #0
  Filled 2021-11-13: qty 90, 90d supply, fill #1

## 2021-08-22 ENCOUNTER — Other Ambulatory Visit (HOSPITAL_BASED_OUTPATIENT_CLINIC_OR_DEPARTMENT_OTHER): Payer: Self-pay

## 2021-08-23 ENCOUNTER — Other Ambulatory Visit (HOSPITAL_BASED_OUTPATIENT_CLINIC_OR_DEPARTMENT_OTHER): Payer: Self-pay

## 2021-08-23 MED ORDER — ELIQUIS 5 MG PO TABS
5.0000 mg | ORAL_TABLET | Freq: Two times a day (BID) | ORAL | 0 refills | Status: DC
  Filled 2021-08-23: qty 60, 30d supply, fill #0

## 2021-08-25 ENCOUNTER — Other Ambulatory Visit (HOSPITAL_BASED_OUTPATIENT_CLINIC_OR_DEPARTMENT_OTHER): Payer: Self-pay

## 2021-08-27 ENCOUNTER — Other Ambulatory Visit (HOSPITAL_BASED_OUTPATIENT_CLINIC_OR_DEPARTMENT_OTHER): Payer: Self-pay

## 2021-08-27 MED ORDER — FUROSEMIDE 40 MG PO TABS
ORAL_TABLET | ORAL | 0 refills | Status: DC
  Filled 2021-08-27: qty 90, 90d supply, fill #0

## 2021-08-27 MED ORDER — OMEPRAZOLE 20 MG PO CPDR
20.0000 mg | DELAYED_RELEASE_CAPSULE | Freq: Every day | ORAL | 0 refills | Status: DC
  Filled 2021-08-27: qty 90, 90d supply, fill #0

## 2021-08-28 ENCOUNTER — Other Ambulatory Visit (HOSPITAL_BASED_OUTPATIENT_CLINIC_OR_DEPARTMENT_OTHER): Payer: Self-pay

## 2021-09-02 ENCOUNTER — Other Ambulatory Visit (HOSPITAL_BASED_OUTPATIENT_CLINIC_OR_DEPARTMENT_OTHER): Payer: Self-pay

## 2021-09-02 MED FILL — Fluticasone Propionate Nasal Susp 50 MCG/ACT: NASAL | 30 days supply | Qty: 16 | Fill #2 | Status: AC

## 2021-09-03 ENCOUNTER — Other Ambulatory Visit (HOSPITAL_BASED_OUTPATIENT_CLINIC_OR_DEPARTMENT_OTHER): Payer: Self-pay

## 2021-09-03 MED ORDER — PFIZER COVID-19 VAC BIVALENT 30 MCG/0.3ML IM SUSP
INTRAMUSCULAR | 0 refills | Status: DC
  Filled 2021-09-03: qty 0.3, 1d supply, fill #0

## 2021-09-09 ENCOUNTER — Other Ambulatory Visit (HOSPITAL_BASED_OUTPATIENT_CLINIC_OR_DEPARTMENT_OTHER): Payer: Self-pay

## 2021-09-11 ENCOUNTER — Other Ambulatory Visit (HOSPITAL_BASED_OUTPATIENT_CLINIC_OR_DEPARTMENT_OTHER): Payer: Self-pay

## 2021-09-13 ENCOUNTER — Other Ambulatory Visit (HOSPITAL_BASED_OUTPATIENT_CLINIC_OR_DEPARTMENT_OTHER): Payer: Self-pay

## 2021-09-24 ENCOUNTER — Other Ambulatory Visit (HOSPITAL_BASED_OUTPATIENT_CLINIC_OR_DEPARTMENT_OTHER): Payer: Self-pay

## 2021-09-25 ENCOUNTER — Other Ambulatory Visit (HOSPITAL_BASED_OUTPATIENT_CLINIC_OR_DEPARTMENT_OTHER): Payer: Self-pay

## 2021-09-25 MED ORDER — TIZANIDINE HCL 4 MG PO TABS
4.0000 mg | ORAL_TABLET | Freq: Three times a day (TID) | ORAL | 0 refills | Status: DC | PRN
  Filled 2021-09-25: qty 90, 30d supply, fill #0

## 2021-09-25 MED ORDER — ELIQUIS 5 MG PO TABS
5.0000 mg | ORAL_TABLET | Freq: Two times a day (BID) | ORAL | 0 refills | Status: DC
  Filled 2021-09-25: qty 60, 30d supply, fill #0

## 2021-10-01 ENCOUNTER — Other Ambulatory Visit (HOSPITAL_BASED_OUTPATIENT_CLINIC_OR_DEPARTMENT_OTHER): Payer: Self-pay

## 2021-10-11 ENCOUNTER — Other Ambulatory Visit (HOSPITAL_BASED_OUTPATIENT_CLINIC_OR_DEPARTMENT_OTHER): Payer: Self-pay

## 2021-10-11 MED ORDER — MORPHINE SULFATE 15 MG PO TABS
ORAL_TABLET | ORAL | 0 refills | Status: DC
  Filled 2021-10-11: qty 120, 30d supply, fill #0

## 2021-10-11 MED ORDER — ELIQUIS 5 MG PO TABS
5.0000 mg | ORAL_TABLET | Freq: Two times a day (BID) | ORAL | 0 refills | Status: DC
  Filled 2021-10-11 – 2021-10-16 (×2): qty 60, 30d supply, fill #0

## 2021-10-16 ENCOUNTER — Other Ambulatory Visit (HOSPITAL_BASED_OUTPATIENT_CLINIC_OR_DEPARTMENT_OTHER): Payer: Self-pay

## 2021-10-17 ENCOUNTER — Other Ambulatory Visit (HOSPITAL_BASED_OUTPATIENT_CLINIC_OR_DEPARTMENT_OTHER): Payer: Self-pay

## 2021-10-17 MED ORDER — HYDROXYZINE HCL 25 MG PO TABS
12.5000 mg | ORAL_TABLET | Freq: Three times a day (TID) | ORAL | 1 refills | Status: DC | PRN
  Filled 2021-10-17: qty 60, 10d supply, fill #0
  Filled 2021-11-11: qty 60, 10d supply, fill #1

## 2021-10-23 ENCOUNTER — Other Ambulatory Visit (HOSPITAL_BASED_OUTPATIENT_CLINIC_OR_DEPARTMENT_OTHER): Payer: Self-pay

## 2021-10-24 ENCOUNTER — Other Ambulatory Visit (HOSPITAL_BASED_OUTPATIENT_CLINIC_OR_DEPARTMENT_OTHER): Payer: Self-pay

## 2021-10-24 MED ORDER — PREGABALIN 100 MG PO CAPS
ORAL_CAPSULE | ORAL | 1 refills | Status: DC
Start: 2021-10-24 — End: 2022-07-18
  Filled 2021-10-24: qty 270, 90d supply, fill #0
  Filled 2022-05-27: qty 120, 40d supply, fill #1
  Filled 2022-05-28: qty 150, 50d supply, fill #1

## 2021-10-25 ENCOUNTER — Other Ambulatory Visit (HOSPITAL_BASED_OUTPATIENT_CLINIC_OR_DEPARTMENT_OTHER): Payer: Self-pay

## 2021-10-27 ENCOUNTER — Other Ambulatory Visit (HOSPITAL_BASED_OUTPATIENT_CLINIC_OR_DEPARTMENT_OTHER): Payer: Self-pay

## 2021-10-28 ENCOUNTER — Other Ambulatory Visit (HOSPITAL_BASED_OUTPATIENT_CLINIC_OR_DEPARTMENT_OTHER): Payer: Self-pay

## 2021-10-28 MED ORDER — DONEPEZIL HCL 10 MG PO TABS
ORAL_TABLET | ORAL | 3 refills | Status: DC
  Filled 2021-11-01: qty 30, 30d supply, fill #0
  Filled 2021-11-21 – 2021-11-25 (×2): qty 30, 30d supply, fill #1
  Filled 2022-01-01: qty 30, 30d supply, fill #2

## 2021-10-28 MED ORDER — VENLAFAXINE HCL ER 150 MG PO CP24
150.0000 mg | ORAL_CAPSULE | Freq: Every day | ORAL | 0 refills | Status: DC
  Filled 2021-11-01: qty 90, 90d supply, fill #0

## 2021-11-04 ENCOUNTER — Other Ambulatory Visit (HOSPITAL_BASED_OUTPATIENT_CLINIC_OR_DEPARTMENT_OTHER): Payer: Self-pay

## 2021-11-11 ENCOUNTER — Other Ambulatory Visit (HOSPITAL_BASED_OUTPATIENT_CLINIC_OR_DEPARTMENT_OTHER): Payer: Self-pay

## 2021-11-12 ENCOUNTER — Other Ambulatory Visit (HOSPITAL_BASED_OUTPATIENT_CLINIC_OR_DEPARTMENT_OTHER): Payer: Self-pay

## 2021-11-12 MED ORDER — MORPHINE SULFATE 15 MG PO TABS
ORAL_TABLET | ORAL | 0 refills | Status: DC
  Filled 2021-11-12: qty 120, 30d supply, fill #0

## 2021-11-12 MED ORDER — MORPHINE SULFATE 15 MG PO TABS
ORAL_TABLET | ORAL | 0 refills | Status: DC
Start: 2021-11-12 — End: 2024-05-18
  Filled 2021-11-12: qty 120, 30d supply, fill #0
  Filled 2021-12-10: qty 28, 7d supply, fill #0

## 2021-11-13 ENCOUNTER — Other Ambulatory Visit (HOSPITAL_BASED_OUTPATIENT_CLINIC_OR_DEPARTMENT_OTHER): Payer: Self-pay

## 2021-11-14 ENCOUNTER — Other Ambulatory Visit (HOSPITAL_BASED_OUTPATIENT_CLINIC_OR_DEPARTMENT_OTHER): Payer: Self-pay

## 2021-11-14 MED ORDER — FUROSEMIDE 40 MG PO TABS
ORAL_TABLET | ORAL | 0 refills | Status: DC
  Filled 2021-11-14: qty 90, 90d supply, fill #0

## 2021-11-18 ENCOUNTER — Other Ambulatory Visit (HOSPITAL_BASED_OUTPATIENT_CLINIC_OR_DEPARTMENT_OTHER): Payer: Self-pay

## 2021-11-18 MED ORDER — ELIQUIS 5 MG PO TABS
5.0000 mg | ORAL_TABLET | Freq: Two times a day (BID) | ORAL | 0 refills | Status: DC
  Filled 2021-11-18: qty 60, 30d supply, fill #0

## 2021-11-18 MED ORDER — FLUTICASONE PROPIONATE 50 MCG/ACT NA SUSP
NASAL | 1 refills | Status: DC
  Filled 2021-11-18: qty 16, 30d supply, fill #0
  Filled 2022-01-12: qty 16, 30d supply, fill #1

## 2021-11-21 ENCOUNTER — Other Ambulatory Visit (HOSPITAL_BASED_OUTPATIENT_CLINIC_OR_DEPARTMENT_OTHER): Payer: Self-pay

## 2021-11-22 ENCOUNTER — Other Ambulatory Visit (HOSPITAL_BASED_OUTPATIENT_CLINIC_OR_DEPARTMENT_OTHER): Payer: Self-pay

## 2021-11-22 MED ORDER — OMEPRAZOLE 20 MG PO CPDR
20.0000 mg | DELAYED_RELEASE_CAPSULE | Freq: Every day | ORAL | 0 refills | Status: DC
  Filled 2021-11-22: qty 90, 90d supply, fill #0

## 2021-11-25 ENCOUNTER — Other Ambulatory Visit (HOSPITAL_BASED_OUTPATIENT_CLINIC_OR_DEPARTMENT_OTHER): Payer: Self-pay

## 2021-12-06 ENCOUNTER — Other Ambulatory Visit (HOSPITAL_BASED_OUTPATIENT_CLINIC_OR_DEPARTMENT_OTHER): Payer: Self-pay

## 2021-12-08 ENCOUNTER — Other Ambulatory Visit (HOSPITAL_BASED_OUTPATIENT_CLINIC_OR_DEPARTMENT_OTHER): Payer: Self-pay

## 2021-12-09 ENCOUNTER — Other Ambulatory Visit (HOSPITAL_BASED_OUTPATIENT_CLINIC_OR_DEPARTMENT_OTHER): Payer: Self-pay

## 2021-12-09 MED ORDER — TIZANIDINE HCL 4 MG PO TABS
4.0000 mg | ORAL_TABLET | Freq: Three times a day (TID) | ORAL | 0 refills | Status: DC | PRN
  Filled 2021-12-09: qty 90, 30d supply, fill #0

## 2021-12-09 MED ORDER — HYDROXYZINE HCL 25 MG PO TABS
ORAL_TABLET | ORAL | 1 refills | Status: DC
  Filled 2021-12-09: qty 60, 10d supply, fill #0

## 2021-12-09 MED ORDER — HYDROXYZINE HCL 25 MG PO TABS
ORAL_TABLET | ORAL | 1 refills | Status: DC
Start: 2021-12-09 — End: 2024-05-18
  Filled 2021-12-09: qty 60, 10d supply, fill #0

## 2021-12-10 ENCOUNTER — Other Ambulatory Visit (HOSPITAL_BASED_OUTPATIENT_CLINIC_OR_DEPARTMENT_OTHER): Payer: Self-pay

## 2021-12-12 ENCOUNTER — Other Ambulatory Visit (HOSPITAL_BASED_OUTPATIENT_CLINIC_OR_DEPARTMENT_OTHER): Payer: Self-pay

## 2021-12-13 ENCOUNTER — Other Ambulatory Visit (HOSPITAL_BASED_OUTPATIENT_CLINIC_OR_DEPARTMENT_OTHER): Payer: Self-pay

## 2021-12-13 MED ORDER — OXYCODONE-ACETAMINOPHEN 7.5-325 MG PO TABS
ORAL_TABLET | ORAL | 0 refills | Status: DC
  Filled 2021-12-13 (×2): qty 28, 7d supply, fill #0

## 2021-12-13 MED ORDER — OXYCODONE-ACETAMINOPHEN 10-325 MG PO TABS
ORAL_TABLET | ORAL | 0 refills | Status: DC
  Filled 2021-12-13: qty 28, 7d supply, fill #0

## 2021-12-19 ENCOUNTER — Other Ambulatory Visit (HOSPITAL_BASED_OUTPATIENT_CLINIC_OR_DEPARTMENT_OTHER): Payer: Self-pay

## 2021-12-20 ENCOUNTER — Other Ambulatory Visit (HOSPITAL_BASED_OUTPATIENT_CLINIC_OR_DEPARTMENT_OTHER): Payer: Self-pay

## 2021-12-20 MED ORDER — ELIQUIS 5 MG PO TABS
5.0000 mg | ORAL_TABLET | Freq: Two times a day (BID) | ORAL | 2 refills | Status: DC
  Filled 2021-12-20: qty 60, 30d supply, fill #0
  Filled 2022-01-13: qty 60, 30d supply, fill #1
  Filled 2022-02-16: qty 60, 30d supply, fill #2

## 2021-12-20 MED ORDER — BUPROPION HCL ER (XL) 150 MG PO TB24
ORAL_TABLET | ORAL | 0 refills | Status: AC
  Filled 2021-12-20: qty 30, 30d supply, fill #0

## 2021-12-20 MED ORDER — VENLAFAXINE HCL ER 75 MG PO CP24
ORAL_CAPSULE | ORAL | 0 refills | Status: DC
Start: 2021-12-20 — End: 2024-05-18
  Filled 2021-12-20: qty 30, 30d supply, fill #0

## 2021-12-24 ENCOUNTER — Other Ambulatory Visit (HOSPITAL_BASED_OUTPATIENT_CLINIC_OR_DEPARTMENT_OTHER): Payer: Self-pay

## 2021-12-25 ENCOUNTER — Other Ambulatory Visit (HOSPITAL_BASED_OUTPATIENT_CLINIC_OR_DEPARTMENT_OTHER): Payer: Self-pay

## 2021-12-26 ENCOUNTER — Other Ambulatory Visit (HOSPITAL_BASED_OUTPATIENT_CLINIC_OR_DEPARTMENT_OTHER): Payer: Self-pay

## 2021-12-26 MED ORDER — OXYCODONE-ACETAMINOPHEN 7.5-325 MG PO TABS
ORAL_TABLET | ORAL | 0 refills | Status: DC
  Filled 2021-12-26: qty 120, 30d supply, fill #0

## 2022-01-01 ENCOUNTER — Other Ambulatory Visit (HOSPITAL_BASED_OUTPATIENT_CLINIC_OR_DEPARTMENT_OTHER): Payer: Self-pay

## 2022-01-02 ENCOUNTER — Other Ambulatory Visit (HOSPITAL_BASED_OUTPATIENT_CLINIC_OR_DEPARTMENT_OTHER): Payer: Self-pay

## 2022-01-02 MED ORDER — NORTRIPTYLINE HCL 75 MG PO CAPS
150.0000 mg | ORAL_CAPSULE | Freq: Every day | ORAL | 1 refills | Status: DC
  Filled 2022-01-02: qty 180, 90d supply, fill #0
  Filled 2022-04-22: qty 180, 90d supply, fill #1

## 2022-01-03 ENCOUNTER — Other Ambulatory Visit (HOSPITAL_BASED_OUTPATIENT_CLINIC_OR_DEPARTMENT_OTHER): Payer: Self-pay

## 2022-01-13 ENCOUNTER — Other Ambulatory Visit (HOSPITAL_BASED_OUTPATIENT_CLINIC_OR_DEPARTMENT_OTHER): Payer: Self-pay

## 2022-01-14 ENCOUNTER — Other Ambulatory Visit (HOSPITAL_BASED_OUTPATIENT_CLINIC_OR_DEPARTMENT_OTHER): Payer: Self-pay

## 2022-01-20 ENCOUNTER — Other Ambulatory Visit (HOSPITAL_BASED_OUTPATIENT_CLINIC_OR_DEPARTMENT_OTHER): Payer: Self-pay

## 2022-01-20 MED ORDER — VENLAFAXINE HCL ER 37.5 MG PO CP24
ORAL_CAPSULE | ORAL | 0 refills | Status: DC
  Filled 2022-01-20: qty 22, 30d supply, fill #0

## 2022-01-20 MED ORDER — TRAZODONE HCL 50 MG PO TABS
ORAL_TABLET | ORAL | 5 refills | Status: DC
  Filled 2022-01-20: qty 60, 30d supply, fill #0
  Filled 2022-02-16: qty 60, 30d supply, fill #1
  Filled 2022-04-03: qty 60, 30d supply, fill #2
  Filled 2022-05-04: qty 60, 30d supply, fill #3
  Filled 2022-06-03: qty 60, 30d supply, fill #4
  Filled 2022-07-03: qty 60, 30d supply, fill #5

## 2022-01-21 ENCOUNTER — Other Ambulatory Visit (HOSPITAL_BASED_OUTPATIENT_CLINIC_OR_DEPARTMENT_OTHER): Payer: Self-pay

## 2022-01-22 ENCOUNTER — Other Ambulatory Visit (HOSPITAL_BASED_OUTPATIENT_CLINIC_OR_DEPARTMENT_OTHER): Payer: Self-pay

## 2022-01-24 ENCOUNTER — Other Ambulatory Visit (HOSPITAL_BASED_OUTPATIENT_CLINIC_OR_DEPARTMENT_OTHER): Payer: Self-pay

## 2022-01-24 MED ORDER — OXYCODONE-ACETAMINOPHEN 7.5-325 MG PO TABS
ORAL_TABLET | ORAL | 0 refills | Status: DC
  Filled 2022-01-24: qty 120, 30d supply, fill #0

## 2022-02-02 ENCOUNTER — Other Ambulatory Visit (HOSPITAL_BASED_OUTPATIENT_CLINIC_OR_DEPARTMENT_OTHER): Payer: Self-pay

## 2022-02-03 ENCOUNTER — Other Ambulatory Visit (HOSPITAL_BASED_OUTPATIENT_CLINIC_OR_DEPARTMENT_OTHER): Payer: Self-pay

## 2022-02-04 ENCOUNTER — Other Ambulatory Visit (HOSPITAL_BASED_OUTPATIENT_CLINIC_OR_DEPARTMENT_OTHER): Payer: Self-pay

## 2022-02-04 MED ORDER — DOXAZOSIN MESYLATE 8 MG PO TABS
ORAL_TABLET | ORAL | 1 refills | Status: DC
  Filled 2022-02-04: qty 90, 90d supply, fill #0
  Filled 2022-04-22: qty 90, 90d supply, fill #1

## 2022-02-04 MED ORDER — HYDROXYZINE HCL 25 MG PO TABS
ORAL_TABLET | ORAL | 1 refills | Status: DC
  Filled 2022-02-04: qty 60, 10d supply, fill #0
  Filled 2022-03-04: qty 60, 10d supply, fill #1

## 2022-02-07 ENCOUNTER — Other Ambulatory Visit (HOSPITAL_BASED_OUTPATIENT_CLINIC_OR_DEPARTMENT_OTHER): Payer: Self-pay

## 2022-02-16 ENCOUNTER — Other Ambulatory Visit (HOSPITAL_BASED_OUTPATIENT_CLINIC_OR_DEPARTMENT_OTHER): Payer: Self-pay

## 2022-02-17 ENCOUNTER — Other Ambulatory Visit (HOSPITAL_BASED_OUTPATIENT_CLINIC_OR_DEPARTMENT_OTHER): Payer: Self-pay

## 2022-02-17 MED ORDER — ROSUVASTATIN CALCIUM 40 MG PO TABS
40.0000 mg | ORAL_TABLET | Freq: Every day | ORAL | 1 refills | Status: DC
  Filled 2022-02-17: qty 90, 90d supply, fill #0
  Filled 2022-05-12: qty 90, 90d supply, fill #1

## 2022-02-17 MED ORDER — TIZANIDINE HCL 4 MG PO TABS
ORAL_TABLET | ORAL | 0 refills | Status: DC
  Filled 2022-02-17: qty 90, 30d supply, fill #0

## 2022-02-18 ENCOUNTER — Other Ambulatory Visit (HOSPITAL_BASED_OUTPATIENT_CLINIC_OR_DEPARTMENT_OTHER): Payer: Self-pay

## 2022-02-20 ENCOUNTER — Other Ambulatory Visit (HOSPITAL_BASED_OUTPATIENT_CLINIC_OR_DEPARTMENT_OTHER): Payer: Self-pay

## 2022-02-20 MED ORDER — OXYCODONE-ACETAMINOPHEN 7.5-325 MG PO TABS
ORAL_TABLET | ORAL | 0 refills | Status: DC
  Filled 2022-02-21: qty 120, 30d supply, fill #0

## 2022-02-21 ENCOUNTER — Other Ambulatory Visit (HOSPITAL_BASED_OUTPATIENT_CLINIC_OR_DEPARTMENT_OTHER): Payer: Self-pay

## 2022-02-24 ENCOUNTER — Other Ambulatory Visit (HOSPITAL_BASED_OUTPATIENT_CLINIC_OR_DEPARTMENT_OTHER): Payer: Self-pay

## 2022-02-25 ENCOUNTER — Other Ambulatory Visit (HOSPITAL_BASED_OUTPATIENT_CLINIC_OR_DEPARTMENT_OTHER): Payer: Self-pay

## 2022-02-25 MED ORDER — FUROSEMIDE 40 MG PO TABS
ORAL_TABLET | ORAL | 0 refills | Status: DC
  Filled 2022-02-25: qty 90, 90d supply, fill #0

## 2022-02-25 MED ORDER — MECLIZINE HCL 25 MG PO TABS
ORAL_TABLET | ORAL | 3 refills | Status: DC
  Filled 2022-02-25: qty 60, 30d supply, fill #0
  Filled 2022-04-22: qty 60, 30d supply, fill #1
  Filled 2022-06-05: qty 60, 30d supply, fill #2
  Filled 2022-08-26: qty 60, 30d supply, fill #3

## 2022-02-28 ENCOUNTER — Other Ambulatory Visit (HOSPITAL_BASED_OUTPATIENT_CLINIC_OR_DEPARTMENT_OTHER): Payer: Self-pay

## 2022-03-04 ENCOUNTER — Other Ambulatory Visit (HOSPITAL_BASED_OUTPATIENT_CLINIC_OR_DEPARTMENT_OTHER): Payer: Self-pay

## 2022-03-05 ENCOUNTER — Other Ambulatory Visit (HOSPITAL_BASED_OUTPATIENT_CLINIC_OR_DEPARTMENT_OTHER): Payer: Self-pay

## 2022-03-07 ENCOUNTER — Other Ambulatory Visit (HOSPITAL_BASED_OUTPATIENT_CLINIC_OR_DEPARTMENT_OTHER): Payer: Self-pay

## 2022-03-07 MED ORDER — OXYCODONE-ACETAMINOPHEN 7.5-325 MG PO TABS
ORAL_TABLET | ORAL | 0 refills | Status: DC
  Filled 2022-06-27: qty 120, 30d supply, fill #0

## 2022-03-07 MED ORDER — OXYCODONE-ACETAMINOPHEN 7.5-325 MG PO TABS
ORAL_TABLET | ORAL | 0 refills | Status: DC
  Filled 2022-04-25: qty 120, 30d supply, fill #0

## 2022-03-07 MED ORDER — OXYCODONE-ACETAMINOPHEN 7.5-325 MG PO TABS
ORAL_TABLET | ORAL | 0 refills | Status: DC
  Filled 2022-03-28: qty 120, 30d supply, fill #0
  Filled ????-??-??: fill #0

## 2022-03-11 ENCOUNTER — Other Ambulatory Visit (HOSPITAL_BASED_OUTPATIENT_CLINIC_OR_DEPARTMENT_OTHER): Payer: Self-pay

## 2022-03-11 MED ORDER — OMEPRAZOLE 20 MG PO CPDR
20.0000 mg | DELAYED_RELEASE_CAPSULE | Freq: Every day | ORAL | 1 refills | Status: DC
  Filled 2022-03-11: qty 90, 90d supply, fill #0
  Filled 2022-05-28: qty 90, 90d supply, fill #1
  Filled 2022-08-26: qty 60, 60d supply, fill #2

## 2022-03-14 ENCOUNTER — Other Ambulatory Visit (HOSPITAL_BASED_OUTPATIENT_CLINIC_OR_DEPARTMENT_OTHER): Payer: Self-pay

## 2022-03-21 ENCOUNTER — Other Ambulatory Visit (HOSPITAL_BASED_OUTPATIENT_CLINIC_OR_DEPARTMENT_OTHER): Payer: Self-pay

## 2022-03-24 ENCOUNTER — Other Ambulatory Visit (HOSPITAL_BASED_OUTPATIENT_CLINIC_OR_DEPARTMENT_OTHER): Payer: Self-pay

## 2022-03-25 ENCOUNTER — Other Ambulatory Visit (HOSPITAL_BASED_OUTPATIENT_CLINIC_OR_DEPARTMENT_OTHER): Payer: Self-pay

## 2022-03-25 MED ORDER — ELIQUIS 5 MG PO TABS
ORAL_TABLET | ORAL | 2 refills | Status: DC
  Filled 2022-03-25: qty 60, 30d supply, fill #0
  Filled 2022-04-22: qty 60, 30d supply, fill #1
  Filled 2022-05-28: qty 60, 30d supply, fill #2

## 2022-03-28 ENCOUNTER — Other Ambulatory Visit (HOSPITAL_BASED_OUTPATIENT_CLINIC_OR_DEPARTMENT_OTHER): Payer: Self-pay

## 2022-03-31 ENCOUNTER — Other Ambulatory Visit (HOSPITAL_BASED_OUTPATIENT_CLINIC_OR_DEPARTMENT_OTHER): Payer: Self-pay

## 2022-04-03 ENCOUNTER — Other Ambulatory Visit (HOSPITAL_BASED_OUTPATIENT_CLINIC_OR_DEPARTMENT_OTHER): Payer: Self-pay

## 2022-04-04 ENCOUNTER — Other Ambulatory Visit (HOSPITAL_BASED_OUTPATIENT_CLINIC_OR_DEPARTMENT_OTHER): Payer: Self-pay

## 2022-04-04 MED ORDER — SPIRONOLACTONE 25 MG PO TABS
ORAL_TABLET | ORAL | 3 refills | Status: DC
  Filled 2022-04-04: qty 90, 90d supply, fill #0
  Filled 2022-07-21: qty 90, 90d supply, fill #1
  Filled 2022-10-06: qty 90, 90d supply, fill #2
  Filled 2023-01-08: qty 90, 90d supply, fill #3

## 2022-04-04 MED ORDER — HYDROXYZINE HCL 25 MG PO TABS
ORAL_TABLET | ORAL | 1 refills | Status: DC
  Filled 2022-04-04: qty 60, 10d supply, fill #0
  Filled 2022-05-04: qty 60, 10d supply, fill #1

## 2022-04-11 ENCOUNTER — Other Ambulatory Visit (HOSPITAL_BASED_OUTPATIENT_CLINIC_OR_DEPARTMENT_OTHER): Payer: Self-pay

## 2022-04-15 ENCOUNTER — Other Ambulatory Visit (HOSPITAL_BASED_OUTPATIENT_CLINIC_OR_DEPARTMENT_OTHER): Payer: Self-pay

## 2022-04-15 MED ORDER — FLUTICASONE PROPIONATE 50 MCG/ACT NA SUSP
2.0000 | Freq: Every day | NASAL | 1 refills | Status: DC
  Filled 2022-04-15: qty 16, 30d supply, fill #0
  Filled 2022-06-11: qty 16, 30d supply, fill #1

## 2022-04-23 ENCOUNTER — Other Ambulatory Visit (HOSPITAL_BASED_OUTPATIENT_CLINIC_OR_DEPARTMENT_OTHER): Payer: Self-pay

## 2022-04-25 ENCOUNTER — Other Ambulatory Visit (HOSPITAL_BASED_OUTPATIENT_CLINIC_OR_DEPARTMENT_OTHER): Payer: Self-pay

## 2022-04-25 MED ORDER — VARENICLINE TARTRATE 0.5 MG X 11 & 1 MG X 42 PO TBPK
ORAL_TABLET | ORAL | 0 refills | Status: DC
  Filled 2022-04-25: qty 53, 30d supply, fill #0

## 2022-05-04 ENCOUNTER — Other Ambulatory Visit (HOSPITAL_BASED_OUTPATIENT_CLINIC_OR_DEPARTMENT_OTHER): Payer: Self-pay

## 2022-05-05 ENCOUNTER — Other Ambulatory Visit (HOSPITAL_BASED_OUTPATIENT_CLINIC_OR_DEPARTMENT_OTHER): Payer: Self-pay

## 2022-05-13 ENCOUNTER — Other Ambulatory Visit (HOSPITAL_BASED_OUTPATIENT_CLINIC_OR_DEPARTMENT_OTHER): Payer: Self-pay

## 2022-05-13 MED ORDER — TIZANIDINE HCL 4 MG PO TABS
4.0000 mg | ORAL_TABLET | Freq: Three times a day (TID) | ORAL | 1 refills | Status: DC | PRN
  Filled 2022-05-13: qty 90, 30d supply, fill #0
  Filled 2022-07-15: qty 90, 30d supply, fill #1

## 2022-05-14 ENCOUNTER — Other Ambulatory Visit (HOSPITAL_BASED_OUTPATIENT_CLINIC_OR_DEPARTMENT_OTHER): Payer: Self-pay

## 2022-05-27 ENCOUNTER — Other Ambulatory Visit (HOSPITAL_BASED_OUTPATIENT_CLINIC_OR_DEPARTMENT_OTHER): Payer: Self-pay

## 2022-05-27 MED ORDER — OXYCODONE-ACETAMINOPHEN 7.5-325 MG PO TABS
1.0000 | ORAL_TABLET | Freq: Four times a day (QID) | ORAL | 0 refills | Status: DC | PRN
  Filled 2022-05-27: qty 120, 30d supply, fill #0

## 2022-05-28 ENCOUNTER — Other Ambulatory Visit (HOSPITAL_BASED_OUTPATIENT_CLINIC_OR_DEPARTMENT_OTHER): Payer: Self-pay

## 2022-05-29 ENCOUNTER — Other Ambulatory Visit (HOSPITAL_BASED_OUTPATIENT_CLINIC_OR_DEPARTMENT_OTHER): Payer: Self-pay

## 2022-06-03 ENCOUNTER — Other Ambulatory Visit (HOSPITAL_BASED_OUTPATIENT_CLINIC_OR_DEPARTMENT_OTHER): Payer: Self-pay

## 2022-06-04 ENCOUNTER — Other Ambulatory Visit (HOSPITAL_BASED_OUTPATIENT_CLINIC_OR_DEPARTMENT_OTHER): Payer: Self-pay

## 2022-06-04 MED ORDER — FUROSEMIDE 40 MG PO TABS
40.0000 mg | ORAL_TABLET | Freq: Every day | ORAL | 0 refills | Status: DC | PRN
  Filled 2022-06-04: qty 90, 90d supply, fill #0

## 2022-06-04 MED ORDER — HYDROXYZINE HCL 25 MG PO TABS
25.0000 mg | ORAL_TABLET | Freq: Three times a day (TID) | ORAL | 1 refills | Status: DC | PRN
  Filled 2022-06-04: qty 60, 10d supply, fill #0
  Filled 2022-07-03: qty 60, 10d supply, fill #1

## 2022-06-06 ENCOUNTER — Other Ambulatory Visit (HOSPITAL_BASED_OUTPATIENT_CLINIC_OR_DEPARTMENT_OTHER): Payer: Self-pay

## 2022-06-10 ENCOUNTER — Other Ambulatory Visit (HOSPITAL_BASED_OUTPATIENT_CLINIC_OR_DEPARTMENT_OTHER): Payer: Self-pay

## 2022-06-10 MED ORDER — LEVOCETIRIZINE DIHYDROCHLORIDE 5 MG PO TABS
ORAL_TABLET | ORAL | 1 refills | Status: DC
Start: 2022-06-10 — End: 2022-10-29
  Filled 2022-06-10: qty 90, 90d supply, fill #0
  Filled 2022-08-26: qty 90, 90d supply, fill #1

## 2022-06-12 ENCOUNTER — Other Ambulatory Visit (HOSPITAL_BASED_OUTPATIENT_CLINIC_OR_DEPARTMENT_OTHER): Payer: Self-pay

## 2022-06-13 ENCOUNTER — Other Ambulatory Visit (HOSPITAL_BASED_OUTPATIENT_CLINIC_OR_DEPARTMENT_OTHER): Payer: Self-pay

## 2022-06-20 ENCOUNTER — Other Ambulatory Visit (HOSPITAL_BASED_OUTPATIENT_CLINIC_OR_DEPARTMENT_OTHER): Payer: Self-pay

## 2022-06-25 ENCOUNTER — Other Ambulatory Visit (HOSPITAL_BASED_OUTPATIENT_CLINIC_OR_DEPARTMENT_OTHER): Payer: Self-pay

## 2022-06-27 ENCOUNTER — Other Ambulatory Visit (HOSPITAL_BASED_OUTPATIENT_CLINIC_OR_DEPARTMENT_OTHER): Payer: Self-pay

## 2022-06-27 MED ORDER — ELIQUIS 5 MG PO TABS
5.0000 mg | ORAL_TABLET | Freq: Two times a day (BID) | ORAL | 2 refills | Status: DC
  Filled 2022-06-27: qty 60, 30d supply, fill #0
  Filled 2022-07-21: qty 60, 30d supply, fill #1
  Filled 2022-08-26: qty 60, 30d supply, fill #2

## 2022-07-04 ENCOUNTER — Other Ambulatory Visit (HOSPITAL_BASED_OUTPATIENT_CLINIC_OR_DEPARTMENT_OTHER): Payer: Self-pay

## 2022-07-07 ENCOUNTER — Other Ambulatory Visit (HOSPITAL_BASED_OUTPATIENT_CLINIC_OR_DEPARTMENT_OTHER): Payer: Self-pay

## 2022-07-08 ENCOUNTER — Other Ambulatory Visit (HOSPITAL_BASED_OUTPATIENT_CLINIC_OR_DEPARTMENT_OTHER): Payer: Self-pay

## 2022-07-16 ENCOUNTER — Other Ambulatory Visit (HOSPITAL_BASED_OUTPATIENT_CLINIC_OR_DEPARTMENT_OTHER): Payer: Self-pay

## 2022-07-17 ENCOUNTER — Other Ambulatory Visit (HOSPITAL_BASED_OUTPATIENT_CLINIC_OR_DEPARTMENT_OTHER): Payer: Self-pay

## 2022-07-17 MED ORDER — LIDOCAINE HCL URETHRAL/MUCOSAL 2 % EX GEL
CUTANEOUS | 1 refills | Status: DC
Start: 2022-07-17 — End: 2024-05-18
  Filled 2022-07-17: qty 90, 30d supply, fill #0
  Filled 2022-07-17: qty 90, 90d supply, fill #0

## 2022-07-17 MED ORDER — LIDOCAINE 5 % EX OINT
TOPICAL_OINTMENT | CUTANEOUS | 1 refills | Status: DC
Start: 2022-07-17 — End: 2024-05-18
  Filled 2022-07-17: qty 35.44, 30d supply, fill #0

## 2022-07-18 ENCOUNTER — Other Ambulatory Visit (HOSPITAL_BASED_OUTPATIENT_CLINIC_OR_DEPARTMENT_OTHER): Payer: Self-pay

## 2022-07-18 MED ORDER — PREGABALIN 100 MG PO CAPS
100.0000 mg | ORAL_CAPSULE | Freq: Three times a day (TID) | ORAL | 1 refills | Status: DC
  Filled 2022-07-18 – 2022-09-24 (×2): qty 270, 90d supply, fill #0
  Filled 2023-02-24: qty 270, 90d supply, fill #1

## 2022-07-21 ENCOUNTER — Other Ambulatory Visit (HOSPITAL_BASED_OUTPATIENT_CLINIC_OR_DEPARTMENT_OTHER): Payer: Self-pay

## 2022-07-22 ENCOUNTER — Other Ambulatory Visit (HOSPITAL_BASED_OUTPATIENT_CLINIC_OR_DEPARTMENT_OTHER): Payer: Self-pay

## 2022-07-22 MED ORDER — DOXAZOSIN MESYLATE 8 MG PO TABS
ORAL_TABLET | ORAL | 1 refills | Status: DC
  Filled 2022-07-22: qty 90, 90d supply, fill #0
  Filled 2022-10-20: qty 90, 90d supply, fill #1

## 2022-07-29 ENCOUNTER — Other Ambulatory Visit (HOSPITAL_BASED_OUTPATIENT_CLINIC_OR_DEPARTMENT_OTHER): Payer: Self-pay

## 2022-07-29 MED ORDER — OXYCODONE-ACETAMINOPHEN 7.5-325 MG PO TABS
ORAL_TABLET | ORAL | 0 refills | Status: DC
  Filled 2022-07-29: qty 120, 30d supply, fill #0

## 2022-08-06 ENCOUNTER — Other Ambulatory Visit (HOSPITAL_BASED_OUTPATIENT_CLINIC_OR_DEPARTMENT_OTHER): Payer: Self-pay

## 2022-08-07 ENCOUNTER — Other Ambulatory Visit (HOSPITAL_BASED_OUTPATIENT_CLINIC_OR_DEPARTMENT_OTHER): Payer: Self-pay

## 2022-08-07 MED ORDER — HYDROXYZINE HCL 25 MG PO TABS
12.5000 mg | ORAL_TABLET | Freq: Three times a day (TID) | ORAL | 1 refills | Status: DC | PRN
  Filled 2022-08-07: qty 60, 10d supply, fill #0
  Filled 2022-09-14: qty 60, 10d supply, fill #1

## 2022-08-07 MED ORDER — ROSUVASTATIN CALCIUM 40 MG PO TABS
40.0000 mg | ORAL_TABLET | Freq: Every day | ORAL | 1 refills | Status: DC
  Filled 2022-08-07: qty 90, 90d supply, fill #0
  Filled 2022-10-28: qty 90, 90d supply, fill #1

## 2022-08-08 ENCOUNTER — Other Ambulatory Visit (HOSPITAL_BASED_OUTPATIENT_CLINIC_OR_DEPARTMENT_OTHER): Payer: Self-pay

## 2022-08-14 ENCOUNTER — Other Ambulatory Visit (HOSPITAL_BASED_OUTPATIENT_CLINIC_OR_DEPARTMENT_OTHER): Payer: Self-pay

## 2022-08-14 MED ORDER — TRAZODONE HCL 50 MG PO TABS
50.0000 mg | ORAL_TABLET | Freq: Every day | ORAL | 5 refills | Status: DC
  Filled 2022-08-14: qty 60, 30d supply, fill #0
  Filled 2022-09-14: qty 60, 30d supply, fill #1
  Filled 2022-10-20: qty 60, 30d supply, fill #2
  Filled 2022-11-21: qty 60, 30d supply, fill #3
  Filled 2022-12-21: qty 60, 30d supply, fill #4
  Filled 2023-01-16: qty 60, 30d supply, fill #5

## 2022-08-15 ENCOUNTER — Other Ambulatory Visit (HOSPITAL_BASED_OUTPATIENT_CLINIC_OR_DEPARTMENT_OTHER): Payer: Self-pay

## 2022-08-18 ENCOUNTER — Other Ambulatory Visit (HOSPITAL_BASED_OUTPATIENT_CLINIC_OR_DEPARTMENT_OTHER): Payer: Self-pay

## 2022-08-18 MED ORDER — OXYCODONE-ACETAMINOPHEN 7.5-325 MG PO TABS
1.0000 | ORAL_TABLET | Freq: Four times a day (QID) | ORAL | 0 refills | Status: DC | PRN
  Filled 2023-01-26: qty 120, 30d supply, fill #0

## 2022-08-18 MED ORDER — OXYCODONE-ACETAMINOPHEN 7.5-325 MG PO TABS
1.0000 | ORAL_TABLET | Freq: Four times a day (QID) | ORAL | 0 refills | Status: DC | PRN
  Filled 2022-08-29: qty 120, 30d supply, fill #0

## 2022-08-18 MED ORDER — OXYCODONE-ACETAMINOPHEN 7.5-325 MG PO TABS
1.0000 | ORAL_TABLET | Freq: Four times a day (QID) | ORAL | 0 refills | Status: DC | PRN
  Filled 2022-10-27: qty 120, 30d supply, fill #0

## 2022-08-25 ENCOUNTER — Other Ambulatory Visit (HOSPITAL_BASED_OUTPATIENT_CLINIC_OR_DEPARTMENT_OTHER): Payer: Self-pay

## 2022-08-25 MED ORDER — LIDOCAINE 5 % EX OINT
1.0000 | TOPICAL_OINTMENT | CUTANEOUS | 1 refills | Status: DC
  Filled 2022-08-25: qty 35.44, 30d supply, fill #0
  Filled 2023-07-29: qty 35.44, 30d supply, fill #1

## 2022-08-25 MED ORDER — DONEPEZIL HCL 5 MG PO TABS
5.0000 mg | ORAL_TABLET | Freq: Every day | ORAL | 0 refills | Status: DC
  Filled 2022-08-25: qty 30, 30d supply, fill #0

## 2022-08-26 ENCOUNTER — Other Ambulatory Visit (HOSPITAL_BASED_OUTPATIENT_CLINIC_OR_DEPARTMENT_OTHER): Payer: Self-pay

## 2022-08-27 ENCOUNTER — Other Ambulatory Visit (HOSPITAL_BASED_OUTPATIENT_CLINIC_OR_DEPARTMENT_OTHER): Payer: Self-pay

## 2022-08-27 MED ORDER — FUROSEMIDE 40 MG PO TABS
40.0000 mg | ORAL_TABLET | Freq: Every day | ORAL | 0 refills | Status: DC | PRN
  Filled 2022-08-27: qty 90, 90d supply, fill #0

## 2022-08-29 ENCOUNTER — Other Ambulatory Visit (HOSPITAL_BASED_OUTPATIENT_CLINIC_OR_DEPARTMENT_OTHER): Payer: Self-pay

## 2022-09-01 ENCOUNTER — Other Ambulatory Visit (HOSPITAL_BASED_OUTPATIENT_CLINIC_OR_DEPARTMENT_OTHER): Payer: Self-pay

## 2022-09-01 MED ORDER — COMIRNATY 30 MCG/0.3ML IM SUSY
PREFILLED_SYRINGE | INTRAMUSCULAR | 0 refills | Status: DC
  Filled 2022-09-01: qty 0.3, 1d supply, fill #0

## 2022-09-01 MED ORDER — FLUARIX QUADRIVALENT 0.5 ML IM SUSY
PREFILLED_SYRINGE | INTRAMUSCULAR | 0 refills | Status: DC
  Filled 2022-09-01: qty 0.5, 1d supply, fill #0

## 2022-09-08 ENCOUNTER — Other Ambulatory Visit (HOSPITAL_BASED_OUTPATIENT_CLINIC_OR_DEPARTMENT_OTHER): Payer: Self-pay

## 2022-09-08 MED ORDER — DONEPEZIL HCL 5 MG PO TABS
5.0000 mg | ORAL_TABLET | Freq: Every day | ORAL | 0 refills | Status: AC
  Filled 2022-09-08 – 2022-09-17 (×2): qty 30, 30d supply, fill #0

## 2022-09-09 ENCOUNTER — Other Ambulatory Visit (HOSPITAL_BASED_OUTPATIENT_CLINIC_OR_DEPARTMENT_OTHER): Payer: Self-pay

## 2022-09-10 ENCOUNTER — Other Ambulatory Visit (HOSPITAL_BASED_OUTPATIENT_CLINIC_OR_DEPARTMENT_OTHER): Payer: Self-pay

## 2022-09-10 MED ORDER — FLUTICASONE PROPIONATE 50 MCG/ACT NA SUSP
2.0000 | Freq: Every day | NASAL | 1 refills | Status: DC
  Filled 2022-09-10: qty 16, 30d supply, fill #0
  Filled 2023-02-24: qty 16, 30d supply, fill #1

## 2022-09-14 ENCOUNTER — Other Ambulatory Visit (HOSPITAL_BASED_OUTPATIENT_CLINIC_OR_DEPARTMENT_OTHER): Payer: Self-pay

## 2022-09-15 ENCOUNTER — Other Ambulatory Visit (HOSPITAL_BASED_OUTPATIENT_CLINIC_OR_DEPARTMENT_OTHER): Payer: Self-pay

## 2022-09-15 MED ORDER — TIZANIDINE HCL 4 MG PO TABS
4.0000 mg | ORAL_TABLET | Freq: Three times a day (TID) | ORAL | 1 refills | Status: DC | PRN
  Filled 2022-09-15: qty 90, 30d supply, fill #0
  Filled 2022-10-28: qty 90, 30d supply, fill #1

## 2022-09-17 ENCOUNTER — Other Ambulatory Visit (HOSPITAL_BASED_OUTPATIENT_CLINIC_OR_DEPARTMENT_OTHER): Payer: Self-pay

## 2022-09-24 ENCOUNTER — Other Ambulatory Visit (HOSPITAL_BASED_OUTPATIENT_CLINIC_OR_DEPARTMENT_OTHER): Payer: Self-pay

## 2022-09-25 ENCOUNTER — Other Ambulatory Visit (HOSPITAL_BASED_OUTPATIENT_CLINIC_OR_DEPARTMENT_OTHER): Payer: Self-pay

## 2022-09-25 MED ORDER — ALBUTEROL SULFATE HFA 108 (90 BASE) MCG/ACT IN AERS
INHALATION_SPRAY | RESPIRATORY_TRACT | 2 refills | Status: DC
  Filled 2022-09-25: qty 6.7, 17d supply, fill #0

## 2022-09-25 MED ORDER — GUAIFENESIN ER 600 MG PO TB12
600.0000 mg | ORAL_TABLET | Freq: Two times a day (BID) | ORAL | 0 refills | Status: DC
  Filled 2022-09-25: qty 20, 10d supply, fill #0

## 2022-09-25 MED ORDER — ELIQUIS 5 MG PO TABS
5.0000 mg | ORAL_TABLET | Freq: Two times a day (BID) | ORAL | 2 refills | Status: DC
  Filled 2022-09-25: qty 60, 30d supply, fill #0
  Filled 2022-10-20: qty 60, 30d supply, fill #1
  Filled 2022-11-21: qty 60, 30d supply, fill #2

## 2022-09-25 MED ORDER — BENZONATATE 200 MG PO CAPS
200.0000 mg | ORAL_CAPSULE | Freq: Three times a day (TID) | ORAL | 0 refills | Status: DC | PRN
  Filled 2022-09-25: qty 30, 10d supply, fill #0

## 2022-09-25 MED ORDER — DONEPEZIL HCL 10 MG PO TABS
10.0000 mg | ORAL_TABLET | Freq: Every day | ORAL | 1 refills | Status: DC
  Filled 2022-09-25: qty 90, 90d supply, fill #0

## 2022-09-26 ENCOUNTER — Other Ambulatory Visit (HOSPITAL_BASED_OUTPATIENT_CLINIC_OR_DEPARTMENT_OTHER): Payer: Self-pay

## 2022-09-26 ENCOUNTER — Other Ambulatory Visit (HOSPITAL_COMMUNITY): Payer: Self-pay

## 2022-09-26 MED ORDER — OXYCODONE-ACETAMINOPHEN 7.5-325 MG PO TABS
1.0000 | ORAL_TABLET | Freq: Four times a day (QID) | ORAL | 0 refills | Status: DC | PRN
  Filled 2022-09-26: qty 120, 30d supply, fill #0

## 2022-10-02 ENCOUNTER — Other Ambulatory Visit (HOSPITAL_BASED_OUTPATIENT_CLINIC_OR_DEPARTMENT_OTHER): Payer: Self-pay

## 2022-10-03 ENCOUNTER — Other Ambulatory Visit (HOSPITAL_COMMUNITY): Payer: Self-pay

## 2022-10-03 ENCOUNTER — Other Ambulatory Visit (HOSPITAL_BASED_OUTPATIENT_CLINIC_OR_DEPARTMENT_OTHER): Payer: Self-pay

## 2022-10-03 MED ORDER — AMOXICILLIN-POT CLAVULANATE 875-125 MG PO TABS
1.0000 | ORAL_TABLET | Freq: Two times a day (BID) | ORAL | 0 refills | Status: DC
  Filled 2022-10-03: qty 14, 7d supply, fill #0

## 2022-10-03 MED ORDER — PREDNISONE 20 MG PO TABS
40.0000 mg | ORAL_TABLET | Freq: Every day | ORAL | 0 refills | Status: DC
  Filled 2022-10-03: qty 6, 3d supply, fill #0

## 2022-10-06 ENCOUNTER — Other Ambulatory Visit (HOSPITAL_BASED_OUTPATIENT_CLINIC_OR_DEPARTMENT_OTHER): Payer: Self-pay

## 2022-10-07 ENCOUNTER — Other Ambulatory Visit (HOSPITAL_BASED_OUTPATIENT_CLINIC_OR_DEPARTMENT_OTHER): Payer: Self-pay

## 2022-10-07 MED ORDER — HYDROXYZINE HCL 25 MG PO TABS
12.5000 mg | ORAL_TABLET | Freq: Three times a day (TID) | ORAL | 1 refills | Status: DC | PRN
  Filled 2022-10-07: qty 60, 10d supply, fill #0
  Filled 2022-11-05: qty 60, 10d supply, fill #1

## 2022-10-09 ENCOUNTER — Other Ambulatory Visit (HOSPITAL_BASED_OUTPATIENT_CLINIC_OR_DEPARTMENT_OTHER): Payer: Self-pay

## 2022-10-20 ENCOUNTER — Other Ambulatory Visit (HOSPITAL_BASED_OUTPATIENT_CLINIC_OR_DEPARTMENT_OTHER): Payer: Self-pay

## 2022-10-21 ENCOUNTER — Other Ambulatory Visit (HOSPITAL_BASED_OUTPATIENT_CLINIC_OR_DEPARTMENT_OTHER): Payer: Self-pay

## 2022-10-21 MED ORDER — MECLIZINE HCL 25 MG PO TABS
25.0000 mg | ORAL_TABLET | Freq: Two times a day (BID) | ORAL | 3 refills | Status: DC | PRN
  Filled 2022-10-21: qty 60, 30d supply, fill #0
  Filled 2022-12-21: qty 60, 30d supply, fill #1
  Filled 2023-02-24: qty 60, 30d supply, fill #2
  Filled 2023-04-14: qty 60, 30d supply, fill #3

## 2022-10-27 ENCOUNTER — Other Ambulatory Visit (HOSPITAL_BASED_OUTPATIENT_CLINIC_OR_DEPARTMENT_OTHER): Payer: Self-pay

## 2022-10-28 ENCOUNTER — Other Ambulatory Visit (HOSPITAL_BASED_OUTPATIENT_CLINIC_OR_DEPARTMENT_OTHER): Payer: Self-pay

## 2022-10-29 ENCOUNTER — Other Ambulatory Visit (HOSPITAL_BASED_OUTPATIENT_CLINIC_OR_DEPARTMENT_OTHER): Payer: Self-pay

## 2022-10-29 MED ORDER — OMEPRAZOLE 20 MG PO CPDR
20.0000 mg | DELAYED_RELEASE_CAPSULE | Freq: Every day | ORAL | 1 refills | Status: DC
  Filled 2022-10-29: qty 90, 90d supply, fill #0
  Filled 2023-01-08 – 2023-01-12 (×3): qty 90, 90d supply, fill #1
  Filled 2023-04-30: qty 60, 60d supply, fill #2
  Filled ????-??-??: fill #1

## 2022-10-29 MED ORDER — LEVOCETIRIZINE DIHYDROCHLORIDE 5 MG PO TABS
5.0000 mg | ORAL_TABLET | Freq: Every evening | ORAL | 1 refills | Status: DC
  Filled 2022-10-29: qty 90, 90d supply, fill #0
  Filled 2023-02-01: qty 90, 90d supply, fill #1

## 2022-11-06 ENCOUNTER — Other Ambulatory Visit: Payer: Self-pay

## 2022-11-13 ENCOUNTER — Other Ambulatory Visit (HOSPITAL_BASED_OUTPATIENT_CLINIC_OR_DEPARTMENT_OTHER): Payer: Self-pay

## 2022-11-21 ENCOUNTER — Other Ambulatory Visit (HOSPITAL_BASED_OUTPATIENT_CLINIC_OR_DEPARTMENT_OTHER): Payer: Self-pay

## 2022-11-21 MED ORDER — FUROSEMIDE 40 MG PO TABS
40.0000 mg | ORAL_TABLET | Freq: Every day | ORAL | 0 refills | Status: DC | PRN
  Filled 2022-11-27: qty 90, 90d supply, fill #0

## 2022-11-25 ENCOUNTER — Other Ambulatory Visit (HOSPITAL_BASED_OUTPATIENT_CLINIC_OR_DEPARTMENT_OTHER): Payer: Self-pay

## 2022-11-25 MED ORDER — OXYCODONE-ACETAMINOPHEN 7.5-325 MG PO TABS
1.0000 | ORAL_TABLET | Freq: Four times a day (QID) | ORAL | 0 refills | Status: DC | PRN
  Filled 2022-11-25: qty 120, 30d supply, fill #0

## 2022-11-27 ENCOUNTER — Other Ambulatory Visit (HOSPITAL_BASED_OUTPATIENT_CLINIC_OR_DEPARTMENT_OTHER): Payer: Self-pay

## 2022-11-28 ENCOUNTER — Other Ambulatory Visit (HOSPITAL_BASED_OUTPATIENT_CLINIC_OR_DEPARTMENT_OTHER): Payer: Self-pay

## 2022-11-28 ENCOUNTER — Other Ambulatory Visit: Payer: Self-pay

## 2022-11-28 MED ORDER — HYDROXYZINE HCL 25 MG PO TABS
12.5000 mg | ORAL_TABLET | Freq: Three times a day (TID) | ORAL | 1 refills | Status: DC | PRN
  Filled 2022-11-28: qty 60, 10d supply, fill #0
  Filled 2022-12-25: qty 60, 10d supply, fill #1

## 2022-12-05 ENCOUNTER — Other Ambulatory Visit (HOSPITAL_BASED_OUTPATIENT_CLINIC_OR_DEPARTMENT_OTHER): Payer: Self-pay

## 2022-12-15 ENCOUNTER — Other Ambulatory Visit (HOSPITAL_BASED_OUTPATIENT_CLINIC_OR_DEPARTMENT_OTHER): Payer: Self-pay

## 2022-12-15 MED ORDER — NITROFURANTOIN MONOHYD MACRO 100 MG PO CAPS
100.0000 mg | ORAL_CAPSULE | Freq: Two times a day (BID) | ORAL | 0 refills | Status: DC
  Filled 2022-12-15: qty 10, 5d supply, fill #0

## 2022-12-17 ENCOUNTER — Other Ambulatory Visit (HOSPITAL_BASED_OUTPATIENT_CLINIC_OR_DEPARTMENT_OTHER): Payer: Self-pay

## 2022-12-17 MED ORDER — PREDNISONE 20 MG PO TABS
40.0000 mg | ORAL_TABLET | Freq: Every day | ORAL | 0 refills | Status: DC
  Filled 2022-12-17: qty 10, 5d supply, fill #0

## 2022-12-17 MED ORDER — AMOXICILLIN-POT CLAVULANATE 875-125 MG PO TABS
1.0000 | ORAL_TABLET | Freq: Two times a day (BID) | ORAL | 0 refills | Status: DC
  Filled 2022-12-17: qty 7, 4d supply, fill #0

## 2022-12-17 MED ORDER — RAPID RESPONSE COVID-19 VI KIT
PACK | 0 refills | Status: DC
  Filled 2023-09-24: qty 1, fill #0

## 2022-12-17 MED ORDER — AMOXICILLIN-POT CLAVULANATE 875-125 MG PO TABS
1.0000 | ORAL_TABLET | Freq: Two times a day (BID) | ORAL | 0 refills | Status: DC
  Filled 2022-12-17 (×2): qty 14, 7d supply, fill #0

## 2022-12-18 ENCOUNTER — Other Ambulatory Visit (HOSPITAL_BASED_OUTPATIENT_CLINIC_OR_DEPARTMENT_OTHER): Payer: Self-pay

## 2022-12-19 ENCOUNTER — Other Ambulatory Visit (HOSPITAL_BASED_OUTPATIENT_CLINIC_OR_DEPARTMENT_OTHER): Payer: Self-pay

## 2022-12-19 MED ORDER — BENZONATATE 200 MG PO CAPS
200.0000 mg | ORAL_CAPSULE | Freq: Three times a day (TID) | ORAL | 1 refills | Status: AC | PRN
  Filled 2022-12-19: qty 30, 10d supply, fill #0

## 2022-12-21 ENCOUNTER — Other Ambulatory Visit (HOSPITAL_BASED_OUTPATIENT_CLINIC_OR_DEPARTMENT_OTHER): Payer: Self-pay

## 2022-12-22 ENCOUNTER — Other Ambulatory Visit (HOSPITAL_BASED_OUTPATIENT_CLINIC_OR_DEPARTMENT_OTHER): Payer: Self-pay

## 2022-12-22 MED ORDER — PROMETHAZINE-DM 6.25-15 MG/5ML PO SYRP
5.0000 mL | ORAL_SOLUTION | Freq: Four times a day (QID) | ORAL | 0 refills | Status: DC | PRN
  Filled 2022-12-22: qty 118, 6d supply, fill #0

## 2022-12-22 MED ORDER — TIZANIDINE HCL 4 MG PO TABS
4.0000 mg | ORAL_TABLET | Freq: Three times a day (TID) | ORAL | 1 refills | Status: DC | PRN
  Filled 2022-12-22: qty 90, 30d supply, fill #0
  Filled 2023-02-01: qty 90, 30d supply, fill #1

## 2022-12-22 MED ORDER — ELIQUIS 5 MG PO TABS
5.0000 mg | ORAL_TABLET | Freq: Two times a day (BID) | ORAL | 2 refills | Status: DC
  Filled 2022-12-22: qty 60, 30d supply, fill #0
  Filled 2023-01-16: qty 60, 30d supply, fill #1
  Filled 2023-02-24: qty 60, 30d supply, fill #2

## 2022-12-22 MED ORDER — CLOTRIMAZOLE 10 MG MT TROC
10.0000 mg | Freq: Every day | OROMUCOSAL | 0 refills | Status: DC
  Filled 2022-12-22: qty 50, 10d supply, fill #0

## 2022-12-23 ENCOUNTER — Other Ambulatory Visit (HOSPITAL_BASED_OUTPATIENT_CLINIC_OR_DEPARTMENT_OTHER): Payer: Self-pay

## 2022-12-23 MED ORDER — OXYCODONE-ACETAMINOPHEN 7.5-325 MG PO TABS
1.0000 | ORAL_TABLET | Freq: Four times a day (QID) | ORAL | 0 refills | Status: DC | PRN
  Filled 2023-04-28: qty 120, 30d supply, fill #0

## 2022-12-23 MED ORDER — OXYCODONE-ACETAMINOPHEN 7.5-325 MG PO TABS
1.0000 | ORAL_TABLET | Freq: Four times a day (QID) | ORAL | 0 refills | Status: DC | PRN
  Filled 2022-12-25 – 2022-12-26 (×2): qty 120, 30d supply, fill #0

## 2022-12-23 MED ORDER — OXYCODONE-ACETAMINOPHEN 7.5-325 MG PO TABS
ORAL_TABLET | ORAL | 0 refills | Status: DC
  Filled 2023-02-25: qty 120, 30d supply, fill #0

## 2022-12-25 ENCOUNTER — Other Ambulatory Visit (HOSPITAL_BASED_OUTPATIENT_CLINIC_OR_DEPARTMENT_OTHER): Payer: Self-pay

## 2022-12-26 ENCOUNTER — Other Ambulatory Visit (HOSPITAL_BASED_OUTPATIENT_CLINIC_OR_DEPARTMENT_OTHER): Payer: Self-pay

## 2022-12-26 ENCOUNTER — Other Ambulatory Visit: Payer: Self-pay

## 2023-01-08 ENCOUNTER — Other Ambulatory Visit (HOSPITAL_BASED_OUTPATIENT_CLINIC_OR_DEPARTMENT_OTHER): Payer: Self-pay

## 2023-01-09 ENCOUNTER — Other Ambulatory Visit (HOSPITAL_BASED_OUTPATIENT_CLINIC_OR_DEPARTMENT_OTHER): Payer: Self-pay

## 2023-01-09 MED ORDER — DOXAZOSIN MESYLATE 8 MG PO TABS
8.0000 mg | ORAL_TABLET | Freq: Every day | ORAL | 1 refills | Status: DC
  Filled 2023-01-09: qty 90, 90d supply, fill #0
  Filled 2023-04-14: qty 90, 90d supply, fill #1

## 2023-01-13 ENCOUNTER — Other Ambulatory Visit (HOSPITAL_BASED_OUTPATIENT_CLINIC_OR_DEPARTMENT_OTHER): Payer: Self-pay

## 2023-01-13 MED ORDER — SULFAMETHOXAZOLE-TRIMETHOPRIM 800-160 MG PO TABS
1.0000 | ORAL_TABLET | Freq: Two times a day (BID) | ORAL | 0 refills | Status: AC
  Filled 2023-01-13: qty 14, 7d supply, fill #0

## 2023-01-14 ENCOUNTER — Other Ambulatory Visit (HOSPITAL_BASED_OUTPATIENT_CLINIC_OR_DEPARTMENT_OTHER): Payer: Self-pay

## 2023-01-26 ENCOUNTER — Other Ambulatory Visit (HOSPITAL_BASED_OUTPATIENT_CLINIC_OR_DEPARTMENT_OTHER): Payer: Self-pay

## 2023-02-01 ENCOUNTER — Other Ambulatory Visit (HOSPITAL_BASED_OUTPATIENT_CLINIC_OR_DEPARTMENT_OTHER): Payer: Self-pay

## 2023-02-02 ENCOUNTER — Other Ambulatory Visit (HOSPITAL_BASED_OUTPATIENT_CLINIC_OR_DEPARTMENT_OTHER): Payer: Self-pay

## 2023-02-02 MED ORDER — ROSUVASTATIN CALCIUM 40 MG PO TABS
40.0000 mg | ORAL_TABLET | Freq: Every day | ORAL | 1 refills | Status: DC
  Filled 2023-02-02: qty 90, 90d supply, fill #0
  Filled 2023-04-30: qty 90, 90d supply, fill #1

## 2023-02-02 MED ORDER — HYDROXYZINE HCL 25 MG PO TABS
12.5000 mg | ORAL_TABLET | Freq: Three times a day (TID) | ORAL | 1 refills | Status: DC | PRN
  Filled 2023-02-02: qty 60, 10d supply, fill #0
  Filled 2023-02-24: qty 60, 10d supply, fill #1

## 2023-02-24 ENCOUNTER — Other Ambulatory Visit: Payer: Self-pay

## 2023-02-24 ENCOUNTER — Other Ambulatory Visit (HOSPITAL_BASED_OUTPATIENT_CLINIC_OR_DEPARTMENT_OTHER): Payer: Self-pay

## 2023-02-24 MED ORDER — FUROSEMIDE 40 MG PO TABS
40.0000 mg | ORAL_TABLET | Freq: Every day | ORAL | 0 refills | Status: DC | PRN
  Filled 2023-02-24: qty 90, 90d supply, fill #0

## 2023-02-24 MED ORDER — TRAZODONE HCL 50 MG PO TABS
50.0000 mg | ORAL_TABLET | Freq: Every day | ORAL | 5 refills | Status: DC
  Filled 2023-02-24: qty 60, 30d supply, fill #0
  Filled 2023-03-15 – 2023-04-07 (×2): qty 60, 30d supply, fill #1
  Filled 2023-05-03: qty 60, 30d supply, fill #2
  Filled 2023-06-03: qty 60, 30d supply, fill #3
  Filled 2023-06-29: qty 60, 30d supply, fill #4
  Filled 2023-07-29: qty 60, 30d supply, fill #5

## 2023-02-25 ENCOUNTER — Other Ambulatory Visit (HOSPITAL_BASED_OUTPATIENT_CLINIC_OR_DEPARTMENT_OTHER): Payer: Self-pay

## 2023-03-02 ENCOUNTER — Other Ambulatory Visit (HOSPITAL_BASED_OUTPATIENT_CLINIC_OR_DEPARTMENT_OTHER): Payer: Self-pay

## 2023-03-03 ENCOUNTER — Other Ambulatory Visit (HOSPITAL_BASED_OUTPATIENT_CLINIC_OR_DEPARTMENT_OTHER): Payer: Self-pay

## 2023-03-03 MED ORDER — TIZANIDINE HCL 4 MG PO TABS
4.0000 mg | ORAL_TABLET | Freq: Three times a day (TID) | ORAL | 1 refills | Status: DC | PRN
  Filled 2023-03-03: qty 90, 30d supply, fill #0
  Filled 2023-04-07: qty 90, 30d supply, fill #1

## 2023-03-13 ENCOUNTER — Other Ambulatory Visit (HOSPITAL_BASED_OUTPATIENT_CLINIC_OR_DEPARTMENT_OTHER): Payer: Self-pay

## 2023-03-15 ENCOUNTER — Other Ambulatory Visit (HOSPITAL_BASED_OUTPATIENT_CLINIC_OR_DEPARTMENT_OTHER): Payer: Self-pay

## 2023-03-16 ENCOUNTER — Other Ambulatory Visit (HOSPITAL_BASED_OUTPATIENT_CLINIC_OR_DEPARTMENT_OTHER): Payer: Self-pay

## 2023-03-17 ENCOUNTER — Other Ambulatory Visit (HOSPITAL_BASED_OUTPATIENT_CLINIC_OR_DEPARTMENT_OTHER): Payer: Self-pay

## 2023-03-17 MED ORDER — MEMANTINE HCL 5 MG PO TABS
5.0000 mg | ORAL_TABLET | Freq: Two times a day (BID) | ORAL | 2 refills | Status: DC
  Filled 2023-03-17: qty 60, 30d supply, fill #0
  Filled 2023-04-14: qty 60, 30d supply, fill #1
  Filled 2023-05-10: qty 60, 30d supply, fill #2

## 2023-03-17 MED ORDER — HYDROXYZINE HCL 25 MG PO TABS
50.0000 mg | ORAL_TABLET | Freq: Two times a day (BID) | ORAL | 1 refills | Status: DC
  Filled 2023-03-17: qty 120, 30d supply, fill #0
  Filled 2023-04-14: qty 120, 30d supply, fill #1

## 2023-03-18 ENCOUNTER — Other Ambulatory Visit (HOSPITAL_BASED_OUTPATIENT_CLINIC_OR_DEPARTMENT_OTHER): Payer: Self-pay

## 2023-03-18 MED ORDER — ELIQUIS 5 MG PO TABS
5.0000 mg | ORAL_TABLET | Freq: Two times a day (BID) | ORAL | 3 refills | Status: DC
  Filled 2023-03-18 – 2023-03-23 (×2): qty 180, 90d supply, fill #0
  Filled 2023-06-18: qty 180, 90d supply, fill #1
  Filled 2023-09-24: qty 180, 90d supply, fill #2

## 2023-03-20 ENCOUNTER — Other Ambulatory Visit (HOSPITAL_BASED_OUTPATIENT_CLINIC_OR_DEPARTMENT_OTHER): Payer: Self-pay

## 2023-03-20 MED ORDER — LEVOFLOXACIN 750 MG PO TABS
750.0000 mg | ORAL_TABLET | Freq: Every day | ORAL | 0 refills | Status: DC
  Filled 2023-03-20: qty 7, 7d supply, fill #0

## 2023-03-23 ENCOUNTER — Other Ambulatory Visit (HOSPITAL_BASED_OUTPATIENT_CLINIC_OR_DEPARTMENT_OTHER): Payer: Self-pay

## 2023-03-25 ENCOUNTER — Other Ambulatory Visit (HOSPITAL_BASED_OUTPATIENT_CLINIC_OR_DEPARTMENT_OTHER): Payer: Self-pay

## 2023-03-25 MED ORDER — OXYCODONE-ACETAMINOPHEN 7.5-325 MG PO TABS
1.0000 | ORAL_TABLET | Freq: Four times a day (QID) | ORAL | 0 refills | Status: DC | PRN
  Filled 2023-03-27: qty 120, 30d supply, fill #0

## 2023-03-25 MED ORDER — OXYCODONE-ACETAMINOPHEN 7.5-325 MG PO TABS
1.0000 | ORAL_TABLET | Freq: Four times a day (QID) | ORAL | 0 refills | Status: DC | PRN
  Filled 2023-06-26: qty 120, 30d supply, fill #0

## 2023-03-25 MED ORDER — OXYCODONE-ACETAMINOPHEN 7.5-325 MG PO TABS
1.0000 | ORAL_TABLET | Freq: Four times a day (QID) | ORAL | 0 refills | Status: DC | PRN
  Filled 2023-05-26: qty 120, 30d supply, fill #0

## 2023-03-27 ENCOUNTER — Other Ambulatory Visit: Payer: Self-pay

## 2023-03-27 ENCOUNTER — Other Ambulatory Visit (HOSPITAL_BASED_OUTPATIENT_CLINIC_OR_DEPARTMENT_OTHER): Payer: Self-pay

## 2023-04-14 ENCOUNTER — Other Ambulatory Visit (HOSPITAL_BASED_OUTPATIENT_CLINIC_OR_DEPARTMENT_OTHER): Payer: Self-pay

## 2023-04-15 ENCOUNTER — Other Ambulatory Visit (HOSPITAL_BASED_OUTPATIENT_CLINIC_OR_DEPARTMENT_OTHER): Payer: Self-pay

## 2023-04-15 ENCOUNTER — Other Ambulatory Visit: Payer: Self-pay

## 2023-04-15 MED ORDER — IBUPROFEN 800 MG PO TABS
800.0000 mg | ORAL_TABLET | Freq: Three times a day (TID) | ORAL | 0 refills | Status: DC | PRN
  Filled 2023-04-15: qty 16, 6d supply, fill #0

## 2023-04-15 MED ORDER — SPIRONOLACTONE 25 MG PO TABS
25.0000 mg | ORAL_TABLET | Freq: Every day | ORAL | 1 refills | Status: DC
  Filled 2023-04-15: qty 90, 90d supply, fill #0
  Filled 2023-06-29: qty 90, 90d supply, fill #1

## 2023-04-15 MED ORDER — FLUTICASONE PROPIONATE 50 MCG/ACT NA SUSP
2.0000 | Freq: Every day | NASAL | 1 refills | Status: DC
  Filled 2023-04-15: qty 16, 30d supply, fill #0
  Filled 2023-05-10: qty 16, 30d supply, fill #1

## 2023-04-15 MED ORDER — AMOXICILLIN 500 MG PO CAPS
500.0000 mg | ORAL_CAPSULE | Freq: Three times a day (TID) | ORAL | 0 refills | Status: DC
  Filled 2023-04-15: qty 21, 7d supply, fill #0

## 2023-04-28 ENCOUNTER — Other Ambulatory Visit (HOSPITAL_BASED_OUTPATIENT_CLINIC_OR_DEPARTMENT_OTHER): Payer: Self-pay

## 2023-04-30 ENCOUNTER — Other Ambulatory Visit (HOSPITAL_BASED_OUTPATIENT_CLINIC_OR_DEPARTMENT_OTHER): Payer: Self-pay

## 2023-05-01 ENCOUNTER — Other Ambulatory Visit (HOSPITAL_BASED_OUTPATIENT_CLINIC_OR_DEPARTMENT_OTHER): Payer: Self-pay

## 2023-05-01 ENCOUNTER — Other Ambulatory Visit: Payer: Self-pay

## 2023-05-01 MED ORDER — LEVOCETIRIZINE DIHYDROCHLORIDE 5 MG PO TABS
5.0000 mg | ORAL_TABLET | Freq: Every evening | ORAL | 1 refills | Status: DC
  Filled 2023-05-01: qty 90, 90d supply, fill #0
  Filled 2023-07-31: qty 90, 90d supply, fill #1

## 2023-05-07 ENCOUNTER — Other Ambulatory Visit (HOSPITAL_BASED_OUTPATIENT_CLINIC_OR_DEPARTMENT_OTHER): Payer: Self-pay

## 2023-05-08 ENCOUNTER — Other Ambulatory Visit (HOSPITAL_BASED_OUTPATIENT_CLINIC_OR_DEPARTMENT_OTHER): Payer: Self-pay

## 2023-05-08 MED ORDER — TIZANIDINE HCL 4 MG PO TABS
4.0000 mg | ORAL_TABLET | Freq: Three times a day (TID) | ORAL | 1 refills | Status: DC | PRN
  Filled 2023-05-08: qty 90, 30d supply, fill #0
  Filled 2023-06-18: qty 90, 30d supply, fill #1

## 2023-05-08 MED ORDER — FUROSEMIDE 40 MG PO TABS
40.0000 mg | ORAL_TABLET | Freq: Every day | ORAL | 0 refills | Status: DC | PRN
  Filled 2023-05-08: qty 90, 90d supply, fill #0

## 2023-05-10 ENCOUNTER — Other Ambulatory Visit (HOSPITAL_BASED_OUTPATIENT_CLINIC_OR_DEPARTMENT_OTHER): Payer: Self-pay

## 2023-05-11 ENCOUNTER — Other Ambulatory Visit: Payer: Self-pay

## 2023-05-11 ENCOUNTER — Other Ambulatory Visit (HOSPITAL_BASED_OUTPATIENT_CLINIC_OR_DEPARTMENT_OTHER): Payer: Self-pay

## 2023-05-12 ENCOUNTER — Other Ambulatory Visit (HOSPITAL_BASED_OUTPATIENT_CLINIC_OR_DEPARTMENT_OTHER): Payer: Self-pay

## 2023-05-25 ENCOUNTER — Other Ambulatory Visit (HOSPITAL_BASED_OUTPATIENT_CLINIC_OR_DEPARTMENT_OTHER): Payer: Self-pay

## 2023-05-26 ENCOUNTER — Other Ambulatory Visit: Payer: Self-pay

## 2023-05-26 ENCOUNTER — Other Ambulatory Visit (HOSPITAL_BASED_OUTPATIENT_CLINIC_OR_DEPARTMENT_OTHER): Payer: Self-pay

## 2023-06-03 ENCOUNTER — Other Ambulatory Visit (HOSPITAL_BASED_OUTPATIENT_CLINIC_OR_DEPARTMENT_OTHER): Payer: Self-pay

## 2023-06-04 ENCOUNTER — Other Ambulatory Visit (HOSPITAL_BASED_OUTPATIENT_CLINIC_OR_DEPARTMENT_OTHER): Payer: Self-pay

## 2023-06-04 MED ORDER — HYDROXYZINE HCL 25 MG PO TABS
50.0000 mg | ORAL_TABLET | Freq: Two times a day (BID) | ORAL | 0 refills | Status: DC
  Filled 2023-06-04: qty 120, 30d supply, fill #0

## 2023-06-18 ENCOUNTER — Other Ambulatory Visit (HOSPITAL_BASED_OUTPATIENT_CLINIC_OR_DEPARTMENT_OTHER): Payer: Self-pay

## 2023-06-19 ENCOUNTER — Other Ambulatory Visit (HOSPITAL_BASED_OUTPATIENT_CLINIC_OR_DEPARTMENT_OTHER): Payer: Self-pay

## 2023-06-19 MED ORDER — MECLIZINE HCL 25 MG PO TABS
25.0000 mg | ORAL_TABLET | Freq: Two times a day (BID) | ORAL | 3 refills | Status: DC | PRN
  Filled 2023-06-19: qty 60, 30d supply, fill #0
  Filled 2023-08-26: qty 60, 30d supply, fill #1
  Filled 2023-10-20: qty 60, 30d supply, fill #2
  Filled 2023-12-30: qty 60, 30d supply, fill #3

## 2023-06-22 ENCOUNTER — Other Ambulatory Visit (HOSPITAL_BASED_OUTPATIENT_CLINIC_OR_DEPARTMENT_OTHER): Payer: Self-pay

## 2023-06-26 ENCOUNTER — Other Ambulatory Visit (HOSPITAL_BASED_OUTPATIENT_CLINIC_OR_DEPARTMENT_OTHER): Payer: Self-pay

## 2023-06-29 ENCOUNTER — Other Ambulatory Visit (HOSPITAL_BASED_OUTPATIENT_CLINIC_OR_DEPARTMENT_OTHER): Payer: Self-pay

## 2023-06-30 ENCOUNTER — Other Ambulatory Visit (HOSPITAL_BASED_OUTPATIENT_CLINIC_OR_DEPARTMENT_OTHER): Payer: Self-pay

## 2023-06-30 ENCOUNTER — Other Ambulatory Visit: Payer: Self-pay

## 2023-06-30 MED ORDER — PREGABALIN 100 MG PO CAPS
ORAL_CAPSULE | ORAL | 1 refills | Status: DC
  Filled 2023-06-30: qty 270, 90d supply, fill #0

## 2023-06-30 MED ORDER — OMEPRAZOLE 20 MG PO CPDR
20.0000 mg | DELAYED_RELEASE_CAPSULE | Freq: Every day | ORAL | 1 refills | Status: DC
  Filled 2023-06-30: qty 90, 90d supply, fill #0

## 2023-07-01 ENCOUNTER — Other Ambulatory Visit: Payer: Self-pay

## 2023-07-01 ENCOUNTER — Other Ambulatory Visit (HOSPITAL_BASED_OUTPATIENT_CLINIC_OR_DEPARTMENT_OTHER): Payer: Self-pay

## 2023-07-01 MED ORDER — DOXAZOSIN MESYLATE 8 MG PO TABS
8.0000 mg | ORAL_TABLET | Freq: Every evening | ORAL | 1 refills | Status: DC
  Filled 2023-07-01: qty 90, 90d supply, fill #0
  Filled 2023-10-20: qty 90, 90d supply, fill #1

## 2023-07-08 ENCOUNTER — Other Ambulatory Visit (HOSPITAL_BASED_OUTPATIENT_CLINIC_OR_DEPARTMENT_OTHER): Payer: Self-pay

## 2023-07-08 MED ORDER — HYDROXYZINE HCL 25 MG PO TABS
50.0000 mg | ORAL_TABLET | Freq: Every day | ORAL | 0 refills | Status: DC
  Filled 2023-07-08: qty 180, 90d supply, fill #0

## 2023-07-09 ENCOUNTER — Other Ambulatory Visit (HOSPITAL_BASED_OUTPATIENT_CLINIC_OR_DEPARTMENT_OTHER): Payer: Self-pay

## 2023-07-09 MED ORDER — VITAMIN D (ERGOCALCIFEROL) 1.25 MG (50000 UNIT) PO CAPS
50000.0000 [IU] | ORAL_CAPSULE | ORAL | 0 refills | Status: DC
  Filled 2023-07-09: qty 12, 84d supply, fill #0

## 2023-07-10 ENCOUNTER — Other Ambulatory Visit (HOSPITAL_BASED_OUTPATIENT_CLINIC_OR_DEPARTMENT_OTHER): Payer: Self-pay

## 2023-07-14 ENCOUNTER — Other Ambulatory Visit (HOSPITAL_BASED_OUTPATIENT_CLINIC_OR_DEPARTMENT_OTHER): Payer: Self-pay

## 2023-07-15 ENCOUNTER — Other Ambulatory Visit (HOSPITAL_BASED_OUTPATIENT_CLINIC_OR_DEPARTMENT_OTHER): Payer: Self-pay

## 2023-07-15 MED ORDER — METHYLPHENIDATE HCL ER (OSM) 27 MG PO TBCR
27.0000 mg | EXTENDED_RELEASE_TABLET | Freq: Every morning | ORAL | 0 refills | Status: DC
  Filled 2023-07-15: qty 30, 30d supply, fill #0

## 2023-07-16 ENCOUNTER — Other Ambulatory Visit (HOSPITAL_BASED_OUTPATIENT_CLINIC_OR_DEPARTMENT_OTHER): Payer: Self-pay

## 2023-07-16 MED ORDER — FLUOROMETHOLONE 0.1 % OP SUSP
OPHTHALMIC | 0 refills | Status: DC
Start: 2023-07-16 — End: 2024-05-18
  Filled 2023-07-16: qty 10, 14d supply, fill #0

## 2023-07-22 ENCOUNTER — Other Ambulatory Visit (HOSPITAL_BASED_OUTPATIENT_CLINIC_OR_DEPARTMENT_OTHER): Payer: Self-pay

## 2023-07-23 ENCOUNTER — Other Ambulatory Visit (HOSPITAL_BASED_OUTPATIENT_CLINIC_OR_DEPARTMENT_OTHER): Payer: Self-pay

## 2023-07-23 MED ORDER — MEMANTINE HCL 5 MG PO TABS
5.0000 mg | ORAL_TABLET | Freq: Two times a day (BID) | ORAL | 2 refills | Status: DC
  Filled 2023-07-23: qty 60, 30d supply, fill #0
  Filled 2023-08-26: qty 60, 30d supply, fill #1
  Filled 2023-09-24: qty 60, 30d supply, fill #2

## 2023-07-23 MED ORDER — TIZANIDINE HCL 4 MG PO TABS
4.0000 mg | ORAL_TABLET | Freq: Three times a day (TID) | ORAL | 1 refills | Status: DC | PRN
  Filled 2023-07-23: qty 90, 30d supply, fill #0
  Filled 2023-08-26: qty 90, 30d supply, fill #1

## 2023-07-27 ENCOUNTER — Other Ambulatory Visit (HOSPITAL_BASED_OUTPATIENT_CLINIC_OR_DEPARTMENT_OTHER): Payer: Self-pay

## 2023-07-27 MED ORDER — METHYLPHENIDATE HCL ER (OSM) 27 MG PO TBCR
27.0000 mg | EXTENDED_RELEASE_TABLET | Freq: Every morning | ORAL | 0 refills | Status: DC
  Filled 2023-07-27 – 2023-07-29 (×2): qty 30, 30d supply, fill #0

## 2023-07-28 ENCOUNTER — Other Ambulatory Visit (HOSPITAL_BASED_OUTPATIENT_CLINIC_OR_DEPARTMENT_OTHER): Payer: Self-pay

## 2023-07-29 ENCOUNTER — Other Ambulatory Visit (HOSPITAL_BASED_OUTPATIENT_CLINIC_OR_DEPARTMENT_OTHER): Payer: Self-pay

## 2023-07-30 ENCOUNTER — Other Ambulatory Visit (HOSPITAL_BASED_OUTPATIENT_CLINIC_OR_DEPARTMENT_OTHER): Payer: Self-pay

## 2023-07-30 MED ORDER — ROSUVASTATIN CALCIUM 40 MG PO TABS
40.0000 mg | ORAL_TABLET | Freq: Every day | ORAL | 1 refills | Status: DC
  Filled 2023-07-30: qty 90, 90d supply, fill #0
  Filled 2023-10-29: qty 90, 90d supply, fill #1

## 2023-07-30 MED ORDER — OXYCODONE-ACETAMINOPHEN 7.5-325 MG PO TABS
1.0000 | ORAL_TABLET | Freq: Four times a day (QID) | ORAL | 0 refills | Status: DC | PRN
  Filled 2023-07-30: qty 120, 30d supply, fill #0

## 2023-07-31 ENCOUNTER — Other Ambulatory Visit (HOSPITAL_BASED_OUTPATIENT_CLINIC_OR_DEPARTMENT_OTHER): Payer: Self-pay

## 2023-07-31 MED ORDER — OXYCODONE-ACETAMINOPHEN 7.5-325 MG PO TABS
1.0000 | ORAL_TABLET | Freq: Four times a day (QID) | ORAL | 0 refills | Status: DC | PRN
  Filled 2023-10-27: qty 120, 30d supply, fill #0

## 2023-07-31 MED ORDER — OXYCODONE-ACETAMINOPHEN 7.5-325 MG PO TABS
1.0000 | ORAL_TABLET | Freq: Four times a day (QID) | ORAL | 0 refills | Status: DC | PRN
  Filled 2023-09-24: qty 120, 30d supply, fill #0

## 2023-08-11 ENCOUNTER — Other Ambulatory Visit (HOSPITAL_COMMUNITY): Payer: Self-pay

## 2023-08-17 ENCOUNTER — Other Ambulatory Visit (HOSPITAL_COMMUNITY): Payer: Self-pay

## 2023-08-17 MED ORDER — LEVOCETIRIZINE DIHYDROCHLORIDE 5 MG PO TABS
5.0000 mg | ORAL_TABLET | Freq: Every evening | ORAL | 0 refills | Status: DC
  Filled 2023-09-24 – 2023-10-29 (×2): qty 90, 90d supply, fill #0

## 2023-08-17 MED ORDER — ROSUVASTATIN CALCIUM 40 MG PO TABS
40.0000 mg | ORAL_TABLET | Freq: Every day | ORAL | 1 refills | Status: DC
  Filled 2023-08-26: qty 90, 90d supply, fill #0

## 2023-08-17 MED ORDER — DOXAZOSIN MESYLATE 8 MG PO TABS
8.0000 mg | ORAL_TABLET | Freq: Every day | ORAL | 1 refills | Status: DC
  Filled 2023-08-26: qty 90, 90d supply, fill #0

## 2023-08-17 MED ORDER — FUROSEMIDE 40 MG PO TABS
40.0000 mg | ORAL_TABLET | Freq: Every day | ORAL | 1 refills | Status: DC | PRN
  Filled 2023-08-17: qty 90, 90d supply, fill #0

## 2023-08-17 MED ORDER — OMEPRAZOLE 20 MG PO CPDR
20.0000 mg | DELAYED_RELEASE_CAPSULE | Freq: Every day | ORAL | 1 refills | Status: DC
  Filled 2023-09-24: qty 90, 90d supply, fill #0
  Filled 2024-01-06: qty 90, 90d supply, fill #1

## 2023-08-17 MED ORDER — MECLIZINE HCL 25 MG PO TABS
25.0000 mg | ORAL_TABLET | Freq: Two times a day (BID) | ORAL | 3 refills | Status: DC | PRN
  Filled 2023-08-17: qty 60, 30d supply, fill #0

## 2023-08-18 ENCOUNTER — Other Ambulatory Visit: Payer: Self-pay

## 2023-08-21 ENCOUNTER — Other Ambulatory Visit: Payer: Self-pay

## 2023-08-25 ENCOUNTER — Other Ambulatory Visit (HOSPITAL_BASED_OUTPATIENT_CLINIC_OR_DEPARTMENT_OTHER): Payer: Self-pay

## 2023-08-25 ENCOUNTER — Other Ambulatory Visit: Payer: Self-pay

## 2023-08-25 ENCOUNTER — Other Ambulatory Visit (HOSPITAL_COMMUNITY): Payer: Self-pay

## 2023-08-25 MED ORDER — DULOXETINE HCL 20 MG PO CPEP
40.0000 mg | ORAL_CAPSULE | Freq: Every morning | ORAL | 1 refills | Status: DC
  Filled 2023-08-25 – 2024-04-15 (×2): qty 60, 30d supply, fill #0

## 2023-08-25 MED ORDER — METHYLPHENIDATE HCL ER (OSM) 36 MG PO TBCR
36.0000 mg | EXTENDED_RELEASE_TABLET | Freq: Every morning | ORAL | 0 refills | Status: DC
  Filled 2023-08-25: qty 30, 30d supply, fill #0

## 2023-08-26 ENCOUNTER — Other Ambulatory Visit (HOSPITAL_COMMUNITY): Payer: Self-pay

## 2023-08-27 ENCOUNTER — Other Ambulatory Visit: Payer: Self-pay

## 2023-08-27 ENCOUNTER — Other Ambulatory Visit (HOSPITAL_COMMUNITY): Payer: Self-pay

## 2023-08-27 MED ORDER — OXYCODONE-ACETAMINOPHEN 7.5-325 MG PO TABS
1.0000 | ORAL_TABLET | Freq: Four times a day (QID) | ORAL | 0 refills | Status: DC | PRN
  Filled 2023-08-27: qty 120, 30d supply, fill #0

## 2023-08-28 ENCOUNTER — Other Ambulatory Visit (HOSPITAL_COMMUNITY): Payer: Self-pay

## 2023-08-28 ENCOUNTER — Other Ambulatory Visit: Payer: Self-pay

## 2023-08-28 ENCOUNTER — Other Ambulatory Visit (HOSPITAL_BASED_OUTPATIENT_CLINIC_OR_DEPARTMENT_OTHER): Payer: Self-pay

## 2023-08-28 MED ORDER — TRAZODONE HCL 50 MG PO TABS
50.0000 mg | ORAL_TABLET | Freq: Every day | ORAL | 5 refills | Status: DC
  Filled 2023-08-28 (×2): qty 60, 30d supply, fill #0

## 2023-08-28 MED ORDER — SPIRONOLACTONE 25 MG PO TABS
25.0000 mg | ORAL_TABLET | Freq: Every day | ORAL | 0 refills | Status: DC
  Filled 2023-08-28: qty 90, 90d supply, fill #0

## 2023-09-04 ENCOUNTER — Other Ambulatory Visit (HOSPITAL_BASED_OUTPATIENT_CLINIC_OR_DEPARTMENT_OTHER): Payer: Self-pay

## 2023-09-13 ENCOUNTER — Other Ambulatory Visit (HOSPITAL_BASED_OUTPATIENT_CLINIC_OR_DEPARTMENT_OTHER): Payer: Self-pay

## 2023-09-14 ENCOUNTER — Other Ambulatory Visit (HOSPITAL_BASED_OUTPATIENT_CLINIC_OR_DEPARTMENT_OTHER): Payer: Self-pay

## 2023-09-14 MED ORDER — FLUTICASONE PROPIONATE 50 MCG/ACT NA SUSP
2.0000 | Freq: Every day | NASAL | 1 refills | Status: DC
  Filled 2023-09-14: qty 16, 30d supply, fill #0
  Filled 2024-02-08: qty 16, 30d supply, fill #1

## 2023-09-24 ENCOUNTER — Other Ambulatory Visit: Payer: Self-pay

## 2023-09-24 ENCOUNTER — Other Ambulatory Visit (HOSPITAL_COMMUNITY): Payer: Self-pay

## 2023-09-24 MED ORDER — METHYLPHENIDATE HCL ER (OSM) 36 MG PO TBCR
36.0000 mg | EXTENDED_RELEASE_TABLET | Freq: Every morning | ORAL | 0 refills | Status: DC
  Filled 2023-09-24: qty 30, 30d supply, fill #0

## 2023-09-24 MED ORDER — METHYLPHENIDATE HCL ER (OSM) 36 MG PO TBCR: 36.0000 mg | EXTENDED_RELEASE_TABLET | Freq: Every morning | ORAL | 0 refills | Status: DC

## 2023-09-24 MED ORDER — DULOXETINE HCL 20 MG PO CPEP
40.0000 mg | ORAL_CAPSULE | Freq: Every day | ORAL | 2 refills | Status: DC
  Filled 2023-09-24: qty 60, 30d supply, fill #0
  Filled 2023-10-20: qty 60, 30d supply, fill #1
  Filled 2023-11-27: qty 60, 30d supply, fill #2

## 2023-09-25 ENCOUNTER — Other Ambulatory Visit (HOSPITAL_BASED_OUTPATIENT_CLINIC_OR_DEPARTMENT_OTHER): Payer: Self-pay

## 2023-09-25 ENCOUNTER — Other Ambulatory Visit (HOSPITAL_COMMUNITY): Payer: Self-pay

## 2023-09-25 ENCOUNTER — Other Ambulatory Visit: Payer: Self-pay

## 2023-09-26 ENCOUNTER — Other Ambulatory Visit (HOSPITAL_COMMUNITY): Payer: Self-pay

## 2023-09-28 ENCOUNTER — Other Ambulatory Visit (HOSPITAL_COMMUNITY): Payer: Self-pay

## 2023-09-28 ENCOUNTER — Other Ambulatory Visit: Payer: Self-pay

## 2023-09-29 ENCOUNTER — Other Ambulatory Visit: Payer: Self-pay

## 2023-09-29 ENCOUNTER — Other Ambulatory Visit (HOSPITAL_COMMUNITY): Payer: Self-pay

## 2023-10-09 ENCOUNTER — Other Ambulatory Visit (HOSPITAL_BASED_OUTPATIENT_CLINIC_OR_DEPARTMENT_OTHER): Payer: Self-pay

## 2023-10-11 MED ORDER — FUROSEMIDE 40 MG PO TABS
40.0000 mg | ORAL_TABLET | Freq: Every day | ORAL | 0 refills | Status: DC | PRN
  Filled 2023-10-11 – 2023-10-12 (×2): qty 90, 90d supply, fill #0

## 2023-10-11 MED ORDER — SPIRONOLACTONE 25 MG PO TABS
25.0000 mg | ORAL_TABLET | Freq: Every day | ORAL | 1 refills | Status: DC
  Filled 2023-10-11: qty 90, 90d supply, fill #0
  Filled 2023-12-30: qty 90, 90d supply, fill #1

## 2023-10-11 MED ORDER — TIZANIDINE HCL 4 MG PO TABS
4.0000 mg | ORAL_TABLET | Freq: Three times a day (TID) | ORAL | 1 refills | Status: DC | PRN
  Filled 2023-10-11: qty 90, 30d supply, fill #0
  Filled 2023-11-27: qty 90, 30d supply, fill #1

## 2023-10-12 ENCOUNTER — Other Ambulatory Visit (HOSPITAL_BASED_OUTPATIENT_CLINIC_OR_DEPARTMENT_OTHER): Payer: Self-pay

## 2023-10-12 MED ORDER — HYDROXYZINE HCL 25 MG PO TABS
50.0000 mg | ORAL_TABLET | Freq: Every day | ORAL | 0 refills | Status: DC
  Filled 2023-10-12: qty 180, 90d supply, fill #0

## 2023-10-13 ENCOUNTER — Other Ambulatory Visit (HOSPITAL_BASED_OUTPATIENT_CLINIC_OR_DEPARTMENT_OTHER): Payer: Self-pay

## 2023-10-13 MED ORDER — TRAZODONE HCL 50 MG PO TABS
50.0000 mg | ORAL_TABLET | Freq: Every day | ORAL | 1 refills | Status: DC
  Filled 2023-10-13: qty 180, 90d supply, fill #0
  Filled 2024-01-06: qty 180, 90d supply, fill #1

## 2023-10-21 ENCOUNTER — Other Ambulatory Visit: Payer: Self-pay

## 2023-10-22 ENCOUNTER — Other Ambulatory Visit (HOSPITAL_BASED_OUTPATIENT_CLINIC_OR_DEPARTMENT_OTHER): Payer: Self-pay

## 2023-10-23 ENCOUNTER — Other Ambulatory Visit (HOSPITAL_BASED_OUTPATIENT_CLINIC_OR_DEPARTMENT_OTHER): Payer: Self-pay

## 2023-10-23 MED ORDER — METHYLPHENIDATE HCL ER (OSM) 36 MG PO TBCR
36.0000 mg | EXTENDED_RELEASE_TABLET | Freq: Every morning | ORAL | 0 refills | Status: DC
  Filled 2023-10-23: qty 30, 30d supply, fill #0

## 2023-10-26 ENCOUNTER — Other Ambulatory Visit (HOSPITAL_BASED_OUTPATIENT_CLINIC_OR_DEPARTMENT_OTHER): Payer: Self-pay

## 2023-10-27 ENCOUNTER — Other Ambulatory Visit (HOSPITAL_COMMUNITY): Payer: Self-pay

## 2023-10-27 ENCOUNTER — Other Ambulatory Visit (HOSPITAL_BASED_OUTPATIENT_CLINIC_OR_DEPARTMENT_OTHER): Payer: Self-pay

## 2023-10-27 ENCOUNTER — Other Ambulatory Visit: Payer: Self-pay

## 2023-10-27 MED ORDER — MEMANTINE HCL 5 MG PO TABS
5.0000 mg | ORAL_TABLET | Freq: Two times a day (BID) | ORAL | 0 refills | Status: DC
  Filled 2023-10-27: qty 60, 30d supply, fill #0

## 2023-10-30 ENCOUNTER — Other Ambulatory Visit (HOSPITAL_BASED_OUTPATIENT_CLINIC_OR_DEPARTMENT_OTHER): Payer: Self-pay

## 2023-10-30 ENCOUNTER — Other Ambulatory Visit: Payer: Self-pay

## 2023-10-30 MED ORDER — LEVOCETIRIZINE DIHYDROCHLORIDE 5 MG PO TABS
5.0000 mg | ORAL_TABLET | Freq: Every evening | ORAL | 1 refills | Status: DC
  Filled 2023-10-30 – 2024-01-19 (×2): qty 90, 90d supply, fill #0
  Filled 2024-04-28: qty 90, 90d supply, fill #1

## 2023-10-30 MED ORDER — ASPIRIN 81 MG PO CHEW
81.0000 mg | CHEWABLE_TABLET | Freq: Every day | ORAL | 3 refills | Status: AC
  Filled 2023-10-30: qty 100, 100d supply, fill #0

## 2023-10-30 MED ORDER — ROSUVASTATIN CALCIUM 40 MG PO TABS
40.0000 mg | ORAL_TABLET | Freq: Every day | ORAL | 5 refills | Status: DC
  Filled 2023-10-30 – 2024-01-19 (×4): qty 30, 30d supply, fill #0
  Filled 2024-02-16: qty 30, 30d supply, fill #1
  Filled 2024-03-28: qty 30, 30d supply, fill #2
  Filled 2024-04-28: qty 30, 30d supply, fill #3

## 2023-11-03 ENCOUNTER — Other Ambulatory Visit (HOSPITAL_BASED_OUTPATIENT_CLINIC_OR_DEPARTMENT_OTHER): Payer: Self-pay

## 2023-11-04 ENCOUNTER — Other Ambulatory Visit (HOSPITAL_BASED_OUTPATIENT_CLINIC_OR_DEPARTMENT_OTHER): Payer: Self-pay

## 2023-11-06 ENCOUNTER — Other Ambulatory Visit (HOSPITAL_BASED_OUTPATIENT_CLINIC_OR_DEPARTMENT_OTHER): Payer: Self-pay

## 2023-11-13 ENCOUNTER — Other Ambulatory Visit (HOSPITAL_BASED_OUTPATIENT_CLINIC_OR_DEPARTMENT_OTHER): Payer: Self-pay

## 2023-11-13 MED ORDER — OXYCODONE-ACETAMINOPHEN 7.5-325 MG PO TABS
1.0000 | ORAL_TABLET | Freq: Four times a day (QID) | ORAL | 0 refills | Status: DC | PRN
  Filled 2023-12-25 (×2): qty 120, 30d supply, fill #0

## 2023-11-13 MED ORDER — PREGABALIN 100 MG PO CAPS
100.0000 mg | ORAL_CAPSULE | Freq: Three times a day (TID) | ORAL | 0 refills | Status: DC
  Filled 2023-11-13 – 2023-11-18 (×2): qty 90, 30d supply, fill #0
  Filled 2024-01-13: qty 90, 30d supply, fill #1
  Filled 2024-02-21: qty 90, 30d supply, fill #2

## 2023-11-13 MED ORDER — OXYCODONE-ACETAMINOPHEN 7.5-325 MG PO TABS
1.0000 | ORAL_TABLET | Freq: Four times a day (QID) | ORAL | 0 refills | Status: DC | PRN
Start: 2024-01-25 — End: 2024-02-23
  Filled 2024-01-26: qty 120, 30d supply, fill #0

## 2023-11-13 MED ORDER — OXYCODONE-ACETAMINOPHEN 7.5-325 MG PO TABS
1.0000 | ORAL_TABLET | Freq: Four times a day (QID) | ORAL | 0 refills | Status: DC | PRN
  Filled 2023-11-27: qty 120, 30d supply, fill #0

## 2023-11-16 ENCOUNTER — Other Ambulatory Visit (HOSPITAL_BASED_OUTPATIENT_CLINIC_OR_DEPARTMENT_OTHER): Payer: Self-pay

## 2023-11-18 ENCOUNTER — Other Ambulatory Visit (HOSPITAL_BASED_OUTPATIENT_CLINIC_OR_DEPARTMENT_OTHER): Payer: Self-pay

## 2023-11-27 ENCOUNTER — Other Ambulatory Visit: Payer: Self-pay

## 2023-11-27 ENCOUNTER — Other Ambulatory Visit (HOSPITAL_BASED_OUTPATIENT_CLINIC_OR_DEPARTMENT_OTHER): Payer: Self-pay

## 2023-11-27 MED ORDER — METHYLPHENIDATE HCL ER (OSM) 36 MG PO TBCR
36.0000 mg | EXTENDED_RELEASE_TABLET | Freq: Every morning | ORAL | 0 refills | Status: DC
  Filled 2023-11-27: qty 30, 30d supply, fill #0

## 2023-11-27 MED ORDER — MEMANTINE HCL 5 MG PO TABS
5.0000 mg | ORAL_TABLET | Freq: Two times a day (BID) | ORAL | 0 refills | Status: DC
  Filled 2023-11-27: qty 60, 30d supply, fill #0

## 2023-11-30 ENCOUNTER — Other Ambulatory Visit (HOSPITAL_BASED_OUTPATIENT_CLINIC_OR_DEPARTMENT_OTHER): Payer: Self-pay

## 2023-12-02 ENCOUNTER — Other Ambulatory Visit (HOSPITAL_BASED_OUTPATIENT_CLINIC_OR_DEPARTMENT_OTHER): Payer: Self-pay

## 2023-12-02 ENCOUNTER — Other Ambulatory Visit: Payer: Self-pay

## 2023-12-02 ENCOUNTER — Other Ambulatory Visit (HOSPITAL_COMMUNITY): Payer: Self-pay

## 2023-12-02 MED ORDER — METHYLPHENIDATE HCL ER (OSM) 36 MG PO TBCR
36.0000 mg | EXTENDED_RELEASE_TABLET | Freq: Every morning | ORAL | 0 refills | Status: DC
  Filled 2024-02-23: qty 30, 30d supply, fill #0

## 2023-12-02 MED ORDER — METHYLPHENIDATE HCL ER (OSM) 36 MG PO TBCR
36.0000 mg | EXTENDED_RELEASE_TABLET | Freq: Every morning | ORAL | 0 refills | Status: DC
Start: 2023-12-02 — End: 2024-05-18
  Filled 2023-12-30: qty 30, 30d supply, fill #0

## 2023-12-02 MED ORDER — METHYLPHENIDATE HCL ER (OSM) 36 MG PO TBCR
36.0000 mg | EXTENDED_RELEASE_TABLET | Freq: Every morning | ORAL | 0 refills | Status: DC
  Filled 2024-01-19 – 2024-01-27 (×4): qty 30, 30d supply, fill #0

## 2023-12-02 MED ORDER — DULOXETINE HCL 40 MG PO CPEP
40.0000 mg | ORAL_CAPSULE | Freq: Every day | ORAL | 2 refills | Status: DC
  Filled 2023-12-02 – 2023-12-04 (×2): qty 30, 30d supply, fill #0
  Filled 2023-12-30 – 2024-02-08 (×7): qty 30, 30d supply, fill #1

## 2023-12-04 ENCOUNTER — Other Ambulatory Visit (HOSPITAL_BASED_OUTPATIENT_CLINIC_OR_DEPARTMENT_OTHER): Payer: Self-pay

## 2023-12-04 ENCOUNTER — Other Ambulatory Visit: Payer: Self-pay

## 2023-12-07 ENCOUNTER — Other Ambulatory Visit (HOSPITAL_COMMUNITY): Payer: Self-pay

## 2023-12-19 NOTE — Progress Notes (Signed)
 Subjective   Patient ID:  Dawn Escobar is a 63 y.o. (DOB Oct 17, 1960) female    Patient presents with  . Follow-up     HPI:  Dawn Escobar is here to follow up on her excessive sweating and fatigue.  Dawn Escobar has been suffering from excessive sweating for over a year and despite medication changes there ha not been any improvement.  She has had a full workup with cardiology that was inconclusive in relation to the sweating. She reports excessive sweating occurs no matter the temperature.  She drinks plenty of fluids and does not consume that much caffeine.  She also reports fatigue has not improved.  She is seeing psychiatry and they have not addressed her insomnia.  She is on Concerta  for the fatigue and anxiety but reports she does not feel any different.  She has not been placed on medication for the depression or anxiety.  She went to see the sleep specialist and had sleep study which was negative for sleep apnea.  She was told during that visit to remain on Trazodone  for sleep but she reports this does not help and she continues to only get about 3-4 hours and wake up feeling fatigued.  She does note Magnesium  Glycinate has been the most effective in managing her sleep where she will get 4-5 hours.    Reviewed and updated this visit by provider: Tobacco  Allergies  Meds  Problems  Med Hx  Surg Hx  Fam Hx        Review of Systems  is complete and negative except as noted.  Objective   Vitals:   12/16/23 1206 12/16/23 1217  BP: (!) 127/49 (!) 120/58  Patient Position: Sitting   Pulse: 74   Temp: (!) 94 F (34.4 C) (!) 93.3 F (34.1 C)  TempSrc: Temporal Oral  Height: 5' 5 (1.651 m)   Weight: 200 lb 6.4 oz (90.9 kg)   SpO2: 95%   BMI (Calculated): 33.3      Physical Exam Vitals and nursing note reviewed.  Constitutional:      General: She is not in acute distress.    Appearance: Normal appearance.  Cardiovascular:     Rate and Rhythm: Normal rate and  regular rhythm.     Pulses: Normal pulses.     Heart sounds: Normal heart sounds.  Pulmonary:     Effort: Pulmonary effort is normal. No respiratory distress.     Breath sounds: Normal breath sounds.  Skin:    General: Skin is cool and moist.  Neurological:     Mental Status: She is alert and oriented to person, place, and time. Mental status is at baseline.  Psychiatric:        Mood and Affect: Mood normal.        Behavior: Behavior normal.        Thought Content: Thought content normal.        Assessment and Plan  1. Excessive sweating (Primary) -     Ambulatory referral to Endocrinology -     CBC And Differential; Future -     ANA w/Reflex; Future -     C-Reactive Protein; Future; Expected date: 12/17/2023 -     Sedimentation Rate, Automated; Future; Expected date: 12/17/2023 -     PTH Intact; Future 2. Insomnia, unspecified type -     Ambulatory referral to Endocrinology -     CBC And Differential; Future -     ANA w/Reflex; Future -  C-Reactive Protein; Future; Expected date: 12/17/2023 -     Sedimentation Rate, Automated; Future; Expected date: 12/17/2023 -     PTH Intact; Future 3. Fatigue, unspecified type -     Ambulatory referral to Endocrinology -     CBC And Differential; Future -     ANA w/Reflex; Future -     C-Reactive Protein; Future; Expected date: 12/17/2023 -     Sedimentation Rate, Automated; Future; Expected date: 12/17/2023 -     PTH Intact; Future  Her temperature is abnormally low today Will check CBC for infection Will r/o any autoimmune disorders contributing to the sweating and fatigue today  Her prior thyroid  test were all normal Unable to prescribe HRT due to CAD and she has been on SSRI and atypical antidepressants for management of post menopausal vasomotor symptoms. Referral has been made today to Endocrinology for evaluation/consultation.  Follow up in about 3 months (around 03/17/2024) for excessive sweating insomnia .       Patient's Medications  New Prescriptions   No medications on file  Previous Medications   ACETAMINOPHEN  (TYLENOL ) 500 MG TABLET    Take by mouth every 6 (six) hours.   ALBUTEROL  SULFATE 2.5 MG/0.5ML NEBU    Inhale two and a half mg into the lungs every 4 (four) hours as needed (wheezin).   ALBUTEROL  SULFATE HFA (PROVENTIL ,VENTOLIN ,PROAIR ) 108 (90 BASE) MCG/ACT INHALER    Inhale two puffs into the lungs every 4 (four) hours as needed for Wheezing.   APIXABAN  (ELIQUIS ) 5 MG TABLET    Take one tablet (5 mg dose) by mouth 2 (two) times daily.   ASCORBIC ACID (VITAMIN C) 500 MG CAPS    Take by mouth daily.   ASPIRIN  (ASPIRIN  81) 81 MG CHEWABLE TABLET    Chew one tablet (81 mg dose) by mouth daily.   DOXAZOSIN  (CARDURA ) 8 MG TABLET    Take one tablet (8 mg dose) by mouth.   FERROUS SULFATE  (FERROUS SULFATE ) 325 (65 FE) MG TABLET    Take one tablet (325 mg dose) by mouth with breakfast.   FLUTICASONE  PROPIONATE (FLONASE ) 50 MCG/ACTUATION NASAL SPRAY    Place 2 sprays into both nostrils daily.   FUROSEMIDE  (LASIX ) 40 MG TABLET    Take 1 tablet (40 mg total) by mouth daily as needed.   HYDROXYZINE  HCL (ATARAX ) 25 MG TABLET    Take two tablets (50 mg dose) by mouth at bedtime.   LEVOCETIRIZINE (XYZAL ) 5 MG TABLET    Take one tablet (5 mg dose) by mouth every evening.   MECLIZINE  HCL (ANTIVERT ) 25 MG TABLET    Take one tablet (25 mg dose) by mouth 2 (two) times a day as needed for Dizziness.   MEMANTINE  (NAMENDA ) 5 MG TABLET    Take 1 tablet (5 mg total) by mouth 2 (two) times daily.   MEMANTINE  (NAMENDA ) 5 MG TABLET    Take one tablet (5 mg dose) by mouth 2 (two) times daily.   METHYLPHENIDATE  HCL (CONCERTA ) 27 MG CR TABLET    Take one tablet (27 mg dose) by mouth.   METHYLPHENIDATE  HCL (CONCERTA ) 36 MG CR TABLET    Take one tablet (36 mg dose) by mouth every morning.   OMEPRAZOLE  (PRILOSEC) 20 MG CAPSULE    Take one capsule (20 mg dose) by mouth daily.   OXYCODONE -ACETAMINOPHEN  (PERCOCET) 7.5-325 MG  PER TABLET    Take one tablet by mouth every 6 (six) hours as needed for Pain for up to 30  days. DNF Max Daily Amount: 4 tablets   OXYCODONE -ACETAMINOPHEN  (PERCOCET) 7.5-325 MG PER TABLET    Take one tablet by mouth every 6 (six) hours as needed for Pain for up to 30 days. Max Daily Amount: 4 tablets   OXYCODONE -ACETAMINOPHEN  (PERCOCET) 7.5-325 MG PER TABLET    Take one tablet by mouth every 6 (six) hours as needed for Pain for up to 30 days. Max Daily Amount: 4 tablets   PREGABALIN  (LYRICA ) 100 MG CAPSULE    Take one capsule (100 mg dose) by mouth 3 (three) times a day. Max Daily Amount: 300 mg   ROSUVASTATIN  CALCIUM  (CRESTOR ) 40 MG TABLET    Take one tablet (40 mg dose) by mouth daily.   SPIRONOLACTONE  (ALDACTONE ) 25 MG TABLET    Take 1 tablet (25 mg total) by mouth daily.   TIZANIDINE  (ZANAFLEX ) 4 MG TABLET    Take 1 tablet (4 mg total) by mouth every 8 (eight) hours as needed.   TRAZODONE  (DESYREL ) 50 MG TABLET    Take one tablet to two tablets (50-100 mg dose) by mouth at bedtime.  Modified Medications   No medications on file  Discontinued Medications   DULOXETINE  HCL (CYMBALTA ) 20 MG CAPSULE    Take one capsule (20 mg dose) by mouth.      Risks, benefits, and alternatives of the medications and treatment plan prescribed today were discussed, and patient expressed understanding. Plan follow-up as discussed or as needed if any worsening symptoms or change in condition.   A yearly preventative health exam was recommended and current age based recommendations were discussed.

## 2023-12-25 ENCOUNTER — Other Ambulatory Visit (HOSPITAL_BASED_OUTPATIENT_CLINIC_OR_DEPARTMENT_OTHER): Payer: Self-pay

## 2023-12-30 ENCOUNTER — Other Ambulatory Visit (HOSPITAL_BASED_OUTPATIENT_CLINIC_OR_DEPARTMENT_OTHER): Payer: Self-pay

## 2023-12-31 ENCOUNTER — Other Ambulatory Visit (HOSPITAL_BASED_OUTPATIENT_CLINIC_OR_DEPARTMENT_OTHER): Payer: Self-pay

## 2024-01-01 ENCOUNTER — Other Ambulatory Visit (HOSPITAL_BASED_OUTPATIENT_CLINIC_OR_DEPARTMENT_OTHER): Payer: Self-pay

## 2024-01-04 ENCOUNTER — Other Ambulatory Visit (HOSPITAL_BASED_OUTPATIENT_CLINIC_OR_DEPARTMENT_OTHER): Payer: Self-pay

## 2024-01-05 ENCOUNTER — Other Ambulatory Visit (HOSPITAL_BASED_OUTPATIENT_CLINIC_OR_DEPARTMENT_OTHER): Payer: Self-pay

## 2024-01-06 ENCOUNTER — Other Ambulatory Visit (HOSPITAL_BASED_OUTPATIENT_CLINIC_OR_DEPARTMENT_OTHER): Payer: Self-pay

## 2024-01-07 ENCOUNTER — Other Ambulatory Visit (HOSPITAL_BASED_OUTPATIENT_CLINIC_OR_DEPARTMENT_OTHER): Payer: Self-pay

## 2024-01-07 ENCOUNTER — Other Ambulatory Visit: Payer: Self-pay

## 2024-01-12 ENCOUNTER — Other Ambulatory Visit (HOSPITAL_BASED_OUTPATIENT_CLINIC_OR_DEPARTMENT_OTHER): Payer: Self-pay

## 2024-01-13 ENCOUNTER — Other Ambulatory Visit (HOSPITAL_COMMUNITY): Payer: Self-pay

## 2024-01-13 ENCOUNTER — Other Ambulatory Visit (HOSPITAL_BASED_OUTPATIENT_CLINIC_OR_DEPARTMENT_OTHER): Payer: Self-pay

## 2024-01-14 ENCOUNTER — Other Ambulatory Visit: Payer: Self-pay

## 2024-01-14 ENCOUNTER — Other Ambulatory Visit (HOSPITAL_BASED_OUTPATIENT_CLINIC_OR_DEPARTMENT_OTHER): Payer: Self-pay

## 2024-01-14 MED ORDER — FUROSEMIDE 40 MG PO TABS
40.0000 mg | ORAL_TABLET | Freq: Every day | ORAL | 0 refills | Status: DC | PRN
  Filled 2024-01-14: qty 90, 90d supply, fill #0

## 2024-01-14 MED ORDER — HYDROXYZINE HCL 25 MG PO TABS
50.0000 mg | ORAL_TABLET | Freq: Every day | ORAL | 0 refills | Status: DC
  Filled 2024-01-14: qty 180, 90d supply, fill #0

## 2024-01-19 ENCOUNTER — Other Ambulatory Visit (HOSPITAL_BASED_OUTPATIENT_CLINIC_OR_DEPARTMENT_OTHER): Payer: Self-pay

## 2024-01-20 ENCOUNTER — Other Ambulatory Visit (HOSPITAL_BASED_OUTPATIENT_CLINIC_OR_DEPARTMENT_OTHER): Payer: Self-pay

## 2024-01-20 MED ORDER — MEMANTINE HCL 5 MG PO TABS
5.0000 mg | ORAL_TABLET | Freq: Two times a day (BID) | ORAL | 1 refills | Status: DC
  Filled 2024-01-20: qty 60, 30d supply, fill #0
  Filled 2024-02-16: qty 60, 30d supply, fill #1

## 2024-01-20 MED ORDER — DOXAZOSIN MESYLATE 8 MG PO TABS
8.0000 mg | ORAL_TABLET | Freq: Every evening | ORAL | 0 refills | Status: DC
  Filled 2024-01-20: qty 90, 90d supply, fill #0

## 2024-01-22 ENCOUNTER — Other Ambulatory Visit (HOSPITAL_BASED_OUTPATIENT_CLINIC_OR_DEPARTMENT_OTHER): Payer: Self-pay

## 2024-01-26 ENCOUNTER — Other Ambulatory Visit (HOSPITAL_BASED_OUTPATIENT_CLINIC_OR_DEPARTMENT_OTHER): Payer: Self-pay

## 2024-01-27 ENCOUNTER — Other Ambulatory Visit: Payer: Self-pay

## 2024-01-27 ENCOUNTER — Other Ambulatory Visit (HOSPITAL_BASED_OUTPATIENT_CLINIC_OR_DEPARTMENT_OTHER): Payer: Self-pay

## 2024-02-09 ENCOUNTER — Other Ambulatory Visit (HOSPITAL_BASED_OUTPATIENT_CLINIC_OR_DEPARTMENT_OTHER): Payer: Self-pay

## 2024-02-10 ENCOUNTER — Other Ambulatory Visit (HOSPITAL_BASED_OUTPATIENT_CLINIC_OR_DEPARTMENT_OTHER): Payer: Self-pay

## 2024-02-16 ENCOUNTER — Other Ambulatory Visit (HOSPITAL_BASED_OUTPATIENT_CLINIC_OR_DEPARTMENT_OTHER): Payer: Self-pay

## 2024-02-16 DIAGNOSIS — M48061 Spinal stenosis, lumbar region without neurogenic claudication: Secondary | ICD-10-CM | POA: Diagnosis not present

## 2024-02-16 DIAGNOSIS — G8929 Other chronic pain: Secondary | ICD-10-CM | POA: Diagnosis not present

## 2024-02-16 DIAGNOSIS — M25512 Pain in left shoulder: Secondary | ICD-10-CM | POA: Diagnosis not present

## 2024-02-16 DIAGNOSIS — M4726 Other spondylosis with radiculopathy, lumbar region: Secondary | ICD-10-CM | POA: Diagnosis not present

## 2024-02-16 MED ORDER — FLUTICASONE PROPIONATE 50 MCG/ACT NA SUSP
2.0000 | Freq: Every day | NASAL | 1 refills | Status: AC
  Filled 2024-02-16 – 2024-06-03 (×3): qty 16, 30d supply, fill #0
  Filled 2024-07-19: qty 16, 30d supply, fill #1

## 2024-02-17 ENCOUNTER — Other Ambulatory Visit: Payer: Self-pay

## 2024-02-21 ENCOUNTER — Other Ambulatory Visit (HOSPITAL_BASED_OUTPATIENT_CLINIC_OR_DEPARTMENT_OTHER): Payer: Self-pay

## 2024-02-22 ENCOUNTER — Other Ambulatory Visit: Payer: Self-pay

## 2024-02-22 ENCOUNTER — Other Ambulatory Visit (HOSPITAL_BASED_OUTPATIENT_CLINIC_OR_DEPARTMENT_OTHER): Payer: Self-pay

## 2024-02-22 MED ORDER — MECLIZINE HCL 25 MG PO TABS
25.0000 mg | ORAL_TABLET | Freq: Two times a day (BID) | ORAL | 3 refills | Status: AC | PRN
  Filled 2024-02-22: qty 60, 30d supply, fill #0
  Filled 2024-03-28: qty 60, 30d supply, fill #1
  Filled 2024-06-27: qty 60, 30d supply, fill #2
  Filled 2024-08-24: qty 60, 30d supply, fill #3

## 2024-02-23 ENCOUNTER — Other Ambulatory Visit (HOSPITAL_BASED_OUTPATIENT_CLINIC_OR_DEPARTMENT_OTHER): Payer: Self-pay

## 2024-02-23 MED ORDER — OXYCODONE-ACETAMINOPHEN 7.5-325 MG PO TABS
1.0000 | ORAL_TABLET | Freq: Four times a day (QID) | ORAL | 0 refills | Status: DC | PRN
  Filled 2024-02-23: qty 120, 30d supply, fill #0

## 2024-02-23 MED ORDER — AZSTARYS 39.2-7.8 MG PO CAPS: 1.0000 | ORAL_CAPSULE | Freq: Every morning | ORAL | 0 refills | Status: DC

## 2024-02-23 MED ORDER — AZSTARYS 39.2-7.8 MG PO CAPS
1.0000 | ORAL_CAPSULE | Freq: Every morning | ORAL | 0 refills | Status: DC
  Filled 2024-02-23: qty 30, 30d supply, fill #0

## 2024-02-23 MED ORDER — DULOXETINE HCL 40 MG PO CPEP
40.0000 mg | ORAL_CAPSULE | Freq: Every day | ORAL | 2 refills | Status: DC
Start: 2024-02-23 — End: 2024-05-18
  Filled 2024-02-23 – 2024-03-28 (×3): qty 30, 30d supply, fill #0

## 2024-02-24 ENCOUNTER — Other Ambulatory Visit (HOSPITAL_BASED_OUTPATIENT_CLINIC_OR_DEPARTMENT_OTHER): Payer: Self-pay

## 2024-02-24 MED ORDER — LISDEXAMFETAMINE DIMESYLATE 40 MG PO CAPS
40.0000 mg | ORAL_CAPSULE | Freq: Every day | ORAL | 0 refills | Status: DC
  Filled 2024-02-24: qty 30, 30d supply, fill #0

## 2024-02-24 MED ORDER — TIZANIDINE HCL 4 MG PO TABS
4.0000 mg | ORAL_TABLET | Freq: Three times a day (TID) | ORAL | 1 refills | Status: DC | PRN
  Filled 2024-02-24: qty 90, 30d supply, fill #0

## 2024-03-01 ENCOUNTER — Other Ambulatory Visit (HOSPITAL_BASED_OUTPATIENT_CLINIC_OR_DEPARTMENT_OTHER): Payer: Self-pay

## 2024-03-01 ENCOUNTER — Other Ambulatory Visit (HOSPITAL_COMMUNITY): Payer: Self-pay

## 2024-03-02 ENCOUNTER — Other Ambulatory Visit (HOSPITAL_BASED_OUTPATIENT_CLINIC_OR_DEPARTMENT_OTHER): Payer: Self-pay

## 2024-03-02 MED ORDER — PREDNISONE 20 MG PO TABS
20.0000 mg | ORAL_TABLET | Freq: Every day | ORAL | 0 refills | Status: DC
  Filled 2024-03-02: qty 5, 5d supply, fill #0

## 2024-03-02 MED ORDER — BACLOFEN 10 MG PO TABS
10.0000 mg | ORAL_TABLET | Freq: Three times a day (TID) | ORAL | 0 refills | Status: DC | PRN
  Filled 2024-03-02: qty 90, 30d supply, fill #0

## 2024-03-07 ENCOUNTER — Other Ambulatory Visit (HOSPITAL_BASED_OUTPATIENT_CLINIC_OR_DEPARTMENT_OTHER): Payer: Self-pay

## 2024-03-09 ENCOUNTER — Other Ambulatory Visit (HOSPITAL_BASED_OUTPATIENT_CLINIC_OR_DEPARTMENT_OTHER): Payer: Self-pay

## 2024-03-09 ENCOUNTER — Encounter (HOSPITAL_BASED_OUTPATIENT_CLINIC_OR_DEPARTMENT_OTHER): Payer: Self-pay

## 2024-03-09 MED ORDER — DULOXETINE HCL 20 MG PO CPEP
40.0000 mg | ORAL_CAPSULE | Freq: Every day | ORAL | 0 refills | Status: AC
  Filled 2024-03-09: qty 60, 30d supply, fill #0

## 2024-03-18 ENCOUNTER — Other Ambulatory Visit (HOSPITAL_BASED_OUTPATIENT_CLINIC_OR_DEPARTMENT_OTHER): Payer: Self-pay

## 2024-03-18 MED ORDER — MEMANTINE HCL 5 MG PO TABS
5.0000 mg | ORAL_TABLET | Freq: Two times a day (BID) | ORAL | 1 refills | Status: DC
  Filled 2024-03-18: qty 60, 30d supply, fill #0
  Filled 2024-04-18: qty 60, 30d supply, fill #1

## 2024-03-28 ENCOUNTER — Other Ambulatory Visit (HOSPITAL_BASED_OUTPATIENT_CLINIC_OR_DEPARTMENT_OTHER): Payer: Self-pay

## 2024-03-28 DIAGNOSIS — M48061 Spinal stenosis, lumbar region without neurogenic claudication: Secondary | ICD-10-CM | POA: Diagnosis not present

## 2024-03-28 DIAGNOSIS — M51369 Other intervertebral disc degeneration, lumbar region without mention of lumbar back pain or lower extremity pain: Secondary | ICD-10-CM | POA: Diagnosis not present

## 2024-03-28 DIAGNOSIS — M4726 Other spondylosis with radiculopathy, lumbar region: Secondary | ICD-10-CM | POA: Diagnosis not present

## 2024-03-28 DIAGNOSIS — M47816 Spondylosis without myelopathy or radiculopathy, lumbar region: Secondary | ICD-10-CM | POA: Diagnosis not present

## 2024-03-28 MED ORDER — OXYCODONE-ACETAMINOPHEN 7.5-325 MG PO TABS
1.0000 | ORAL_TABLET | Freq: Four times a day (QID) | ORAL | 0 refills | Status: DC | PRN
  Filled 2024-03-28: qty 120, 30d supply, fill #0

## 2024-03-29 ENCOUNTER — Other Ambulatory Visit (HOSPITAL_BASED_OUTPATIENT_CLINIC_OR_DEPARTMENT_OTHER): Payer: Self-pay

## 2024-03-29 MED ORDER — OXYCODONE-ACETAMINOPHEN 7.5-325 MG PO TABS
1.0000 | ORAL_TABLET | Freq: Four times a day (QID) | ORAL | 0 refills | Status: DC | PRN
  Filled 2024-05-30: qty 120, 30d supply, fill #0

## 2024-03-29 MED ORDER — OXYCODONE-ACETAMINOPHEN 7.5-325 MG PO TABS
1.0000 | ORAL_TABLET | Freq: Four times a day (QID) | ORAL | 0 refills | Status: DC | PRN
  Filled 2024-04-28: qty 120, 30d supply, fill #0

## 2024-03-31 ENCOUNTER — Other Ambulatory Visit (HOSPITAL_BASED_OUTPATIENT_CLINIC_OR_DEPARTMENT_OTHER): Payer: Self-pay

## 2024-04-01 ENCOUNTER — Other Ambulatory Visit (HOSPITAL_BASED_OUTPATIENT_CLINIC_OR_DEPARTMENT_OTHER): Payer: Self-pay

## 2024-04-01 MED ORDER — FUROSEMIDE 40 MG PO TABS
40.0000 mg | ORAL_TABLET | Freq: Every day | ORAL | 0 refills | Status: DC | PRN
  Filled 2024-04-01: qty 90, 90d supply, fill #0

## 2024-04-01 MED ORDER — LISDEXAMFETAMINE DIMESYLATE 40 MG PO CAPS
40.0000 mg | ORAL_CAPSULE | Freq: Every day | ORAL | 0 refills | Status: DC
  Filled 2024-04-01 (×2): qty 30, 30d supply, fill #0

## 2024-04-01 MED ORDER — BACLOFEN 10 MG PO TABS
10.0000 mg | ORAL_TABLET | Freq: Three times a day (TID) | ORAL | 0 refills | Status: DC | PRN
  Filled 2024-04-01: qty 90, 30d supply, fill #0

## 2024-04-01 MED ORDER — HYDROXYZINE HCL 25 MG PO TABS
50.0000 mg | ORAL_TABLET | Freq: Every day | ORAL | 0 refills | Status: DC
  Filled 2024-04-01: qty 180, 90d supply, fill #0

## 2024-04-01 MED ORDER — OMEPRAZOLE 20 MG PO CPDR
20.0000 mg | DELAYED_RELEASE_CAPSULE | Freq: Every day | ORAL | 1 refills | Status: DC
  Filled 2024-04-01: qty 90, 90d supply, fill #0
  Filled 2024-06-27: qty 90, 90d supply, fill #1

## 2024-04-04 ENCOUNTER — Other Ambulatory Visit (HOSPITAL_BASED_OUTPATIENT_CLINIC_OR_DEPARTMENT_OTHER): Payer: Self-pay

## 2024-04-04 ENCOUNTER — Other Ambulatory Visit: Payer: Self-pay

## 2024-04-04 MED ORDER — PREGABALIN 100 MG PO CAPS
100.0000 mg | ORAL_CAPSULE | Freq: Three times a day (TID) | ORAL | 0 refills | Status: DC
  Filled 2024-04-04: qty 270, 90d supply, fill #0

## 2024-04-05 ENCOUNTER — Other Ambulatory Visit (HOSPITAL_BASED_OUTPATIENT_CLINIC_OR_DEPARTMENT_OTHER): Payer: Self-pay

## 2024-04-06 ENCOUNTER — Other Ambulatory Visit: Payer: Self-pay

## 2024-04-06 ENCOUNTER — Other Ambulatory Visit (HOSPITAL_BASED_OUTPATIENT_CLINIC_OR_DEPARTMENT_OTHER): Payer: Self-pay

## 2024-04-06 MED ORDER — SPIRONOLACTONE 25 MG PO TABS
25.0000 mg | ORAL_TABLET | Freq: Every day | ORAL | 1 refills | Status: DC
  Filled 2024-04-06: qty 90, 90d supply, fill #0

## 2024-04-11 ENCOUNTER — Other Ambulatory Visit (HOSPITAL_BASED_OUTPATIENT_CLINIC_OR_DEPARTMENT_OTHER): Payer: Self-pay

## 2024-04-12 ENCOUNTER — Other Ambulatory Visit (HOSPITAL_BASED_OUTPATIENT_CLINIC_OR_DEPARTMENT_OTHER): Payer: Self-pay

## 2024-04-13 ENCOUNTER — Other Ambulatory Visit (HOSPITAL_BASED_OUTPATIENT_CLINIC_OR_DEPARTMENT_OTHER): Payer: Self-pay

## 2024-04-13 DIAGNOSIS — I5032 Chronic diastolic (congestive) heart failure: Secondary | ICD-10-CM | POA: Diagnosis not present

## 2024-04-13 DIAGNOSIS — I340 Nonrheumatic mitral (valve) insufficiency: Secondary | ICD-10-CM | POA: Diagnosis not present

## 2024-04-13 DIAGNOSIS — I10 Essential (primary) hypertension: Secondary | ICD-10-CM | POA: Diagnosis not present

## 2024-04-13 DIAGNOSIS — I480 Paroxysmal atrial fibrillation: Secondary | ICD-10-CM | POA: Diagnosis not present

## 2024-04-13 DIAGNOSIS — I351 Nonrheumatic aortic (valve) insufficiency: Secondary | ICD-10-CM | POA: Diagnosis not present

## 2024-04-13 MED ORDER — VITAMIN D (ERGOCALCIFEROL) 1.25 MG (50000 UNIT) PO CAPS
50000.0000 [IU] | ORAL_CAPSULE | ORAL | 0 refills | Status: DC
  Filled 2024-04-13: qty 12, 84d supply, fill #0

## 2024-04-15 ENCOUNTER — Other Ambulatory Visit (HOSPITAL_BASED_OUTPATIENT_CLINIC_OR_DEPARTMENT_OTHER): Payer: Self-pay

## 2024-04-18 ENCOUNTER — Other Ambulatory Visit (HOSPITAL_BASED_OUTPATIENT_CLINIC_OR_DEPARTMENT_OTHER): Payer: Self-pay

## 2024-04-18 ENCOUNTER — Other Ambulatory Visit: Payer: Self-pay

## 2024-04-18 MED ORDER — DOXAZOSIN MESYLATE 8 MG PO TABS
8.0000 mg | ORAL_TABLET | Freq: Every evening | ORAL | 0 refills | Status: DC
  Filled 2024-04-18: qty 90, 90d supply, fill #0

## 2024-04-27 ENCOUNTER — Other Ambulatory Visit (HOSPITAL_BASED_OUTPATIENT_CLINIC_OR_DEPARTMENT_OTHER): Payer: Self-pay

## 2024-04-28 ENCOUNTER — Other Ambulatory Visit (HOSPITAL_BASED_OUTPATIENT_CLINIC_OR_DEPARTMENT_OTHER): Payer: Self-pay

## 2024-04-29 ENCOUNTER — Other Ambulatory Visit (HOSPITAL_BASED_OUTPATIENT_CLINIC_OR_DEPARTMENT_OTHER): Payer: Self-pay

## 2024-04-29 MED ORDER — LISDEXAMFETAMINE DIMESYLATE 40 MG PO CAPS
40.0000 mg | ORAL_CAPSULE | Freq: Every day | ORAL | 0 refills | Status: DC
  Filled 2024-05-02: qty 30, 30d supply, fill #0

## 2024-05-02 ENCOUNTER — Other Ambulatory Visit (HOSPITAL_BASED_OUTPATIENT_CLINIC_OR_DEPARTMENT_OTHER): Payer: Self-pay

## 2024-05-09 ENCOUNTER — Other Ambulatory Visit (HOSPITAL_BASED_OUTPATIENT_CLINIC_OR_DEPARTMENT_OTHER): Payer: Self-pay

## 2024-05-10 ENCOUNTER — Other Ambulatory Visit: Payer: Self-pay

## 2024-05-10 ENCOUNTER — Other Ambulatory Visit (HOSPITAL_BASED_OUTPATIENT_CLINIC_OR_DEPARTMENT_OTHER): Payer: Self-pay

## 2024-05-10 MED ORDER — BACLOFEN 10 MG PO TABS
10.0000 mg | ORAL_TABLET | Freq: Three times a day (TID) | ORAL | 0 refills | Status: DC | PRN
  Filled 2024-05-10: qty 90, 30d supply, fill #0

## 2024-05-10 MED ORDER — MEMANTINE HCL 5 MG PO TABS
5.0000 mg | ORAL_TABLET | Freq: Two times a day (BID) | ORAL | 0 refills | Status: AC
  Filled 2024-05-10: qty 60, 30d supply, fill #0

## 2024-05-13 ENCOUNTER — Other Ambulatory Visit (HOSPITAL_BASED_OUTPATIENT_CLINIC_OR_DEPARTMENT_OTHER): Payer: Self-pay

## 2024-05-16 ENCOUNTER — Other Ambulatory Visit (HOSPITAL_BASED_OUTPATIENT_CLINIC_OR_DEPARTMENT_OTHER): Payer: Self-pay

## 2024-05-17 ENCOUNTER — Ambulatory Visit: Payer: Medicare (Managed Care) | Admitting: Endocrinology

## 2024-05-18 ENCOUNTER — Other Ambulatory Visit (HOSPITAL_BASED_OUTPATIENT_CLINIC_OR_DEPARTMENT_OTHER): Payer: Self-pay

## 2024-05-18 ENCOUNTER — Ambulatory Visit: Payer: Medicare (Managed Care) | Admitting: Endocrinology

## 2024-05-18 ENCOUNTER — Encounter: Payer: Self-pay | Admitting: Endocrinology

## 2024-05-18 VITALS — BP 104/60 | HR 80 | Resp 20 | Ht 65.0 in | Wt 203.0 lb

## 2024-05-18 VITALS — BP 104/60 | HR 80 | Temp 98.3°F | Resp 20 | Ht 65.0 in | Wt 203.0 lb

## 2024-05-18 DIAGNOSIS — R61 Generalized hyperhidrosis: Secondary | ICD-10-CM

## 2024-05-18 MED ORDER — DULOXETINE HCL 20 MG PO CPEP
40.0000 mg | ORAL_CAPSULE | Freq: Every day | ORAL | 0 refills | Status: DC
  Filled 2024-05-18 (×2): qty 60, 30d supply, fill #0

## 2024-05-18 NOTE — Patient Instructions (Signed)
 Duloxetine  Delayed-Release Capsules What is this medication? DULOXETINE  (doo LOX e teen) treats depression, anxiety, fibromyalgia, and certain types of chronic pain such as nerve, bone, or joint pain. It increases the amount of serotonin and norepinephrine in the brain, hormones that help regulate mood and pain. It belongs to a group of medications called SNRIs. This medicine may be used for other purposes; ask your health care provider or pharmacist if you have questions. COMMON BRAND NAME(S): Cymbalta , Drizalma, Irenka What should I tell my care team before I take this medication? They need to know if you have any of these conditions: Bipolar disorder Glaucoma High blood pressure Kidney disease Liver disease Seizures Suicidal thoughts, plans, or attempt by you or a family member Take medications that treat or prevent blood clots Taken an MAOI, such as Carbex, Eldepryl, Marplan, Nardil, or Parnate in the last 14 days Trouble passing urine An unusual reaction to duloxetine , other medications, foods, dyes, or preservatives Pregnant or trying to get pregnant Breastfeeding How should I use this medication? Take this medication by mouth with water. Take it as directed on the prescription label at the same time every day. Do not cut, crush, or chew this medication. Swallow the capsules whole. Some capsules may be opened and sprinkled on applesauce. Check with your care team or pharmacist if you are not sure. You can take this medication with or without food. Do not take your medication more often than directed. Do not stop taking this medication suddenly except upon the advice of your care team. Stopping this medication too quickly may cause serious side effects or your condition may worsen. A special MedGuide will be given to you by the pharmacist with each prescription and refill. Be sure to read this information carefully each time. Talk to your care team about the use of this medication in  children. While it may be prescribed for children as young as 7 years for selected conditions, precautions do apply. Overdosage: If you think you have taken too much of this medicine contact a poison control center or emergency room at once. NOTE: This medicine is only for you. Do not share this medicine with others. What if I miss a dose? If you miss a dose, take it as soon as you can. If it is almost time for your next dose, take only that dose. Do not take double or extra doses. What may interact with this medication? Do not take this medication with any of the following: Desvenlafaxine Levomilnacipran Linezolid MAOIs, such as Carbex, Eldepryl, Emsam, Marplan, Nardil, and Parnate Methylene blue (injected into a vein) Milnacipran Safinamide Thioridazine Venlafaxine  Viloxazine This medication may also interact with the following: Alcohol Amphetamines Aspirin  and aspirin -like medications Certain antibiotics, such as ciprofloxacin  and enoxacin Certain medications for blood pressure, heart disease, irregular heart beat Certain medications for mental health conditions Certain medications for migraine headache, such as almotriptan, eletriptan, frovatriptan, naratriptan, rizatriptan, sumatriptan, zolmitriptan Certain medications that treat or prevent blood clots, such as warfarin, enoxaparin , and dalteparin Cimetidine Fentanyl  Lithium NSAIDS, medications for pain and inflammation, such as ibuprofen  or naproxen Phentermine Procarbazine Rasagiline Sibutramine St. John's Wort Theophylline Tramadol  Tryptophan This list may not describe all possible interactions. Give your health care provider a list of all the medicines, herbs, non-prescription drugs, or dietary supplements you use. Also tell them if you smoke, drink alcohol, or use illegal drugs. Some items may interact with your medicine. What should I watch for while using this medication? Tell your care team if your  symptoms do not  get better or if they get worse. Visit your care team for regular checks on your progress. Because it may take several weeks to see the full effects of this medication, it is important to continue your treatment as prescribed by your care team. This medication may cause serious skin reactions. They can happen weeks to months after starting the medication. Contact your care team right away if you notice fevers or flu-like symptoms with a rash. The rash may be red or purple and then turn into blisters or peeling of the skin. You may also notice a red rash with swelling of the face, lips, or lymph nodes in your neck or under your arms. Watch for new or worsening thoughts of suicide or depression. This includes sudden changes in mood, behaviors, or thoughts. These changes can happen at any time but are more common in the beginning of treatment or after a change in dose. Call your care team right away if you experience these thoughts or worsening depression. This medication may cause mood and behavior changes, such as anxiety, nervousness, irritability, hostility, restlessness, excitability, hyperactivity, or trouble sleeping. These changes can happen at any time but are more common in the beginning of treatment or after a change in dose. Call your care team right away if you notice any of these symptoms. This medication may affect your coordination, reaction time, or judgment. Do not drive or operate machinery until you know how this medication affects you. Sit up or stand slowly to reduce the risk of dizzy or fainting spells. Drinking alcohol with this medication can increase the risk of these side effects. This medication may increase blood sugar. The risk may be higher in patients who already have diabetes. Ask your care team what you can do to lower your risk of diabetes while taking this medication. This medication can cause an increase in blood pressure. This medication can also cause a sudden drop in your  blood pressure, which may make you feel faint and increase the chance of a fall. These effects are most common when you first start the medication or when the dose is increased, or during use of other medications that can cause a sudden drop in blood pressure. Check with your care team for instructions on monitoring your blood pressure while taking this medication. Your mouth may get dry. Chewing sugarless gum or sucking hard candy and drinking plenty of water may help. Contact your care team if the problem does not go away or is severe. What side effects may I notice from receiving this medication? Side effects that you should report to your care team as soon as possible: Allergic reactions--skin rash, itching, hives, swelling of the face, lips, tongue, or throat Bleeding--bloody or black, tar-like stools, red or dark brown urine, vomiting blood or brown material that looks like coffee grounds, small, red or purple spots on skin, unusual bleeding or bruising Increase in blood pressure Liver injury--right upper belly pain, loss of appetite, nausea, light-colored stool, dark yellow or brown urine, yellowing skin or eyes, unusual weakness or fatigue Low sodium level--muscle weakness, fatigue, dizziness, headache, confusion Redness, blistering, peeling, or loosening of the skin, including inside the mouth Serotonin syndrome--irritability, confusion, fast or irregular heartbeat, muscle stiffness, twitching muscles, sweating, high fever, seizures, chills, vomiting, diarrhea Sudden eye pain or change in vision such as blurry vision, seeing halos around lights, vision loss Thoughts of suicide or self-harm, worsening mood, feelings of depression Trouble passing urine Side effects that  usually do not require medical attention (report to your care team if they continue or are bothersome): Change in sex drive or performance Constipation Diarrhea Dizziness Dry mouth Excessive sweating Loss of  appetite Nausea Vomiting This list may not describe all possible side effects. Call your doctor for medical advice about side effects. You may report side effects to FDA at 1-800-FDA-1088. Where should I keep my medication? Keep out of the reach of children and pets. Store at room temperature between 15 and 30 degrees C (59 to 86 degrees F). Get rid of any unused medication after the expiration date. To get rid of medications that are no longer needed or have expired: Take the medication to a medication take-back program. Check with your pharmacy or law enforcement to find a location. If you cannot return the medication, check the label or package insert to see if the medication should be thrown out in the garbage or flushed down the toilet. If you are not sure, ask your care team. If it is safe to put it in the trash, take the medication out of the container. Mix the medication with cat litter, dirt, coffee grounds, or other unwanted substance. Seal the mixture in a bag or container. Put it in the trash. NOTE: This sheet is a summary. It may not cover all possible information. If you have questions about this medicine, talk to your doctor, pharmacist, or health care provider.  2024 Elsevier/Gold Standard (2022-06-26 00:00:00)

## 2024-05-18 NOTE — Progress Notes (Signed)
 Outpatient Endocrinology Note Dawn Sharif Rendell, MD   Patient's Name: Dawn Escobar    DOB: 09-01-1961    MRN: [id number]  REASON OF VISIT: New consult for excessive sweating.  REFERRING PROVIDER: excessive sweating.   PCP:  Dawn Ludie BIRCH, FNP  HISTORY OF PRESENT ILLNESS:   Dawn Escobar is a 63 y.o. old female with past medical history listed below, is here for new consult for excessive sweating.  Pertinent Hx:  Patient is referred to endocrinology for the evaluation of excessive sweating.  Patient complains of excessive sweating for 3+ years has been worsening from this year from beginning of 2025.  Sweating is almost every day all over the body mostly in the face, aggravated by trying to do something and sometimes feels like passing out.  She reports sweating is not as bad at night.  Denies palpitation or elevated blood pressure.  Denies headache associated with sweating.  No flushing of the face.  No itching of the body.  No change in bowel habits/diarrhea.  No shortness of breath.  No loss of appetite or weight loss.  She was evaluated by primary care provider in March 2024 at [facility] had laboratory workup reviewed in Care Everywhere.  She had normal thyroid function test.  Normal serum calcium.  Normal parathyroid hormone.  Sweating for couple 3+ years, worse this year. All day. All over the body, mostly in face, soak cloth, trying to something, feels like passing. Not as bad at night. No palpitations.   With a chart review she has been on SSRI currently on duloxetine from March/April 2025.  She used to be on paroxetine in the past.  She had been on and off of these medications on and off at least from 2016/2017 timeframe.  Patient does not recall the timeframe of the medications he had been on.  She reports multiple medications had been tried for her depression and anxiety and she has been following ?  With private practice psychiatry.  Along with duloxetine she has been  currently on multiple other antidepressant/antianxiety medications.  REVIEW OF SYSTEMS:  As per history of present illness.   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   Tape Rash    No tape at all    FAMILY HISTORY:  Family History  Problem Relation Age of Onset   Heart disease Mother    Dementia Mother    Heart attack Father    Colon cancer Brother    Hypertension Sister  Hypertension Sister    Hypertension Sister    Hypertension Brother    Hypertension Brother     SOCIAL HISTORY: Social History   Socioeconomic History   Marital status: Significant Other    Spouse name: Not on file   Number of children: 0   Years of education: 12th   Highest education level: Not on file  Occupational History   Occupation: n/a  Tobacco Use   Smoking status: Every Day    Current packs/day: 0.25    Types: Cigarettes   Smokeless tobacco: Never  Vaping Use   Vaping status: Never Used  Substance and Sexual Activity   Alcohol use: Yes    Alcohol/week: 1.0 standard drink of alcohol    Types: 1  Glasses of wine per week    Comment: occasionally   Drug use: No    Types: Oxycodone     Comment: patient takes oxycodone  for pain   Sexual activity: Never    Comment: pt. reports that she believes she's going through the change; have no sex drive; hot flashes and excessive sweating for a couple of months now  Other Topics Concern   Not on file  Social History Narrative   Patient lives at home alone.   Caffeine use: 1 cup of coffee and 20 oz of soda   Social Drivers of Corporate investment banker Strain: Low Risk  (04/11/2024)   Received from [facility]   Overall Financial Resource Strain (CARDIA)    Difficulty of Paying Living Expenses: Not hard at all  Food Insecurity: Food Insecurity Present (04/11/2024)   Received from [facility] Dawn Escobar   Hunger Vital Sign    Within the past 12 months, you worried that your food would run out before you got the money to buy more.: Sometimes true    Within the past 12 months, the food you bought just didn't last and you didn't have money to get more.: Sometimes true  Transportation Needs: No Transportation Needs (04/11/2024)   Received from [facility] - Transportation    Lack of Transportation (Medical): No    Lack of Transportation (Non-Medical): No  Physical Activity: Unknown (04/11/2024)   Received from [facility]   Exercise Vital Sign    On average, how many days per week do you engage in moderate to strenuous exercise (like a brisk walk)?: 0 days    Minutes of Exercise per Session: Not on file  Stress: Stress Concern Present (04/11/2024)   Received from [facility] - Occupational Stress Questionnaire    Feeling of Stress : To some extent  Social Connections: Socially Integrated (04/11/2024)   Received from [facility]   Social Network    How would you rate your social network (family, work, friends)?: Good participation with social networks    MEDICATIONS:  Current Outpatient  Medications  Medication Sig Dispense Refill   acetaminophen  (TYLENOL ) 500 MG tablet Take 500 mg by mouth every 6 (six) hours as needed for mild pain, moderate pain or headache.     albuterol  (VENTOLIN  HFA) 108 (90 Base) MCG/ACT inhaler Inhale two puffs into the lungs every 4 (four) hours as needed for Wheezing. 18 g 2   albuterol  (VENTOLIN  HFA) 108 (90 Base) MCG/ACT inhaler Inhale two puffs into the lungs every 4 (four) hours as needed for Wheezing. 6.7 g 2   apixaban  (ELIQUIS ) 5 MG TABS tablet Take 1 tablet by mouth 2 (two) times daily.  Ascorbic Acid (VITAMIN C PO) Take by mouth daily.     aspirin  81 MG chewable tablet Chew 1 tablet (81 mg total) by mouth daily. 100 tablet 3   baclofen  (LIORESAL ) 10 MG tablet Take 1 tablet (10 mg total) by mouth 3 (three) times daily as needed. 90 tablet 0   benzonatate  (TESSALON ) 200 MG capsule Take one capsule (200 mg dose) by mouth 3 (three) times a day as needed for Cough. 30 capsule 1   buPROPion  (WELLBUTRIN  XL) 150 MG 24 hr tablet Take one tablet (150 mg dose) by mouth daily. 30 tablet 0   donepezil  (ARICEPT ) 5 MG tablet Take 1 tablet (5 mg total) by mouth at bedtime. 30 tablet 0   doxazosin  (CARDURA ) 8 MG tablet Take 8 mg by mouth.     DULoxetine  (CYMBALTA ) 20 MG capsule Take 2 capsules (40 mg total) by mouth daily. 60 capsule 0   fluticasone  (FLONASE ) 50 MCG/ACT nasal spray Place 2 sprays into both nostrils daily. 16 g 1   furosemide  (LASIX ) 40 MG tablet Take 40 mg by mouth.     gabapentin  (NEURONTIN ) 600 MG tablet Take 600 mg by mouth 2 (two) times a day.     hydrOXYzine  (ATARAX ) 25 MG tablet Take 50 mg by mouth.     hydrOXYzine  (VISTARIL ) 25 MG capsule TAKE 1 CAPSULE BY MOUTH AT NIGHT TIME AS NEEDED 30 capsule 3   levocetirizine (XYZAL ) 5 MG tablet Take 5 mg by mouth every evening.     meclizine  (ANTIVERT ) 25 MG tablet Take 1 tablet (25 mg total) by mouth 2 (two) times daily as needed for dizziness. 60 tablet 3   meclizine  (ANTIVERT ) 25 MG tablet  Take 25 mg by mouth.     memantine  (NAMENDA ) 5 MG tablet Take 1 tablet (5 mg total) by mouth 2 (two) times daily. 60 tablet 0   omeprazole  (PRILOSEC) 20 MG capsule Take 20 mg by mouth daily.     oxyCODONE -acetaminophen  (PERCOCET) 7.5-325 MG tablet Take 1 tablet by mouth.     PARoxetine (PAXIL) 40 MG tablet paroxetine 40 mg tablet  TAKE 1 TABLET BY MOUTH ONCE DAILY EVERY MORNING     pregabalin  (LYRICA ) 100 MG capsule Take 100 mg by mouth.     rosuvastatin  (CRESTOR ) 40 MG tablet Take 1 tablet by mouth at bedtime.     traZODone  (DESYREL ) 50 MG tablet Take 50-100 mg by mouth.     Vitamin D , Ergocalciferol , (DRISDOL ) 1.25 MG (50000 UNIT) CAPS capsule Take 50,000 Units by mouth.     amoxicillin  (AMOXIL ) 875 MG tablet Take one tablet (875 mg dose) by mouth 2 (two) times daily for 10 days. 20 tablet 0   DULoxetine  (CYMBALTA ) 20 MG capsule Take 2 capsules (40 mg total) by mouth daily. 60 capsule 0   [START ON 05/28/2024] oxyCODONE -acetaminophen  (PERCOCET) 7.5-325 MG tablet Take one tablet by mouth every 6 (six) hours as needed for Pain for up to 30 days. Max Daily Amount: 4 tablets 120 tablet 0   No current facility-administered medications for this visit.    PHYSICAL EXAM: Vitals:   05/18/24 1522  BP: 104/60  Pulse: 80  Resp: 20  SpO2: 97%  Weight: 203 lb (92.1 kg)  Height: 5' 5 (1.651 m)   Body mass index is 33.78 kg/m.  Wt Readings from Last 3 Encounters:  05/18/24 203 lb (92.1 kg)  06/17/19 210 lb (95.3 kg)  04/22/19 211 lb (95.7 kg)       General: Well developed,  well nourished female in no apparent distress. Appropriate for age.  HEENT: AT/[place], no external lesions. Hearing intact to the spoken word Eyes: EOMI. Conjunctiva clear and no icterus. Neck: Trachea midline, neck supple without appreciable thyromegaly or lymphadenopathy and no palpable thyroid  nodules Lungs: Clear to auscultation, no wheeze. Respirations not labored Heart: S1S2, Regular in rate and rhythm.  Abdomen: Soft,  non tender, non distended Neurologic: Alert, oriented, normal speech, deep tendon biceps reflexes normal,  no gross focal neurological deficit Extremities: No pedal pitting edema, no tremors of outstretched hands Skin: Warm, color good. Psychiatric: Does not appear depressed or anxious  PERTINENT HISTORIC LABORATORY AND IMAGING STUDIES:  All pertinent laboratory results were reviewed. Please see HPI also for further details.   ASSESSMENT / PLAN  1. Hyperhidrosis   2. Excessive sweating    Patient complains of excessive sweating for 3+ years, has been worsening from beginning of this year 2025.  Etiology is unclear at this time.  No obvious features suggestive of pheochromocytoma or carcinoid tumor.  She has been on SSRI currently on duloxetine  and in the past was on paroxetine.  There is a possibility of excessive sweating is caused by using SSRI.  It is known side effect of having excessive sweating with using duloxetine  or paroxetine.  Discussed that if excessive is caused by using duloxetine  it is not a harmful side effect.  Asked patient to discuss with primary care provider / psychiatrist to consider alternative treatment for depression/anxiety.  -I like to check pharmacy metanephrines however some of her current antidepressant medication may falsely elevate levels. -Will check thyroid  function test.  She had normal TSH in June 2025 in outside hospital. -Rest of the management plan or follow-up plan based on above test results.  Diagnoses and all orders for this visit:  Hyperhidrosis -     Metanephrines, plasma -     Basic metabolic panel with GFR -     T4, free -     TSH  Excessive sweating   DISPOSITION Follow up in clinic in to be determined based on the above plan.    All questions answered and patient verbalized understanding of the plan.   Dawn Keoshia Steinmetz, MD [facility] Endocrinology [facility] [address] [place], [address] [place], [place] [address] Phone #  [phone]  At least part of this note was generated using voice recognition software. Inadvertent word errors may have occurred, which were not recognized during the proofreading process.    Addendum:  Labs reviewed normal thyroid  function test.  Plasma metanephrines with normal metanephrine and mild elevated normetanephrine, this can be usually falsely elevated especially in the context of taking multiple mental health medications.  Usual positive level of elevated normetanephrine will be more than 2 times upper limit of normal.  No additional endocrinology workup at this time.  She can continue to follow-up with primary care provider and mental health provider.  Recommendations as above.  No routine endocrinology follow-up is required at this time.  Encouraged to call our clinic with any question.   Latest Reference Range & Units 05/18/24 15:58  Metanephrine, Pl <=57 pg/mL 43  Normetanephrine, Pl <=148 pg/mL 162 (H)  Glucose 65 - 99 mg/dL 888 (H)  TSH 9.59 - 5.49 mIU/L 1.87  T4,Free(Direct) 0.8 - 1.8 ng/dL 1.0  (H): Data is abnormally high   Latest Reference Range & Units 05/18/24 15:58  Sodium 135 - 146 mmol/L 142  Potassium 3.5 - 5.3 mmol/L 3.8  Chloride 98 -  110 mmol/L 104  CO2 20 - 32 mmol/L 31  Glucose 65 - 99 mg/dL 888 (H)  BUN 7 - 25 mg/dL 13  Creatinine 9.49 - 8.94 mg/dL 9.06  Calcium  8.6 - 10.4 mg/dL 8.9  BUN/Creatinine Ratio 6 - 22 (calc) SEE NOTE:  eGFR > OR = 60 mL/min/1.35m2 69  (H): Data is abnormally high

## 2024-05-22 LAB — BASIC METABOLIC PANEL WITH GFR
BUN: 13 mg/dL (ref 7–25)
CO2: 31 mmol/L (ref 20–32)
Calcium: 8.9 mg/dL (ref 8.6–10.4)
Chloride: 104 mmol/L (ref 98–110)
Creat: 0.93 mg/dL (ref 0.50–1.05)
Glucose, Bld: 111 mg/dL — ABNORMAL HIGH (ref 65–99)
Potassium: 3.8 mmol/L (ref 3.5–5.3)
Sodium: 142 mmol/L (ref 135–146)
eGFR: 69 mL/min/1.73m2 (ref >=60)

## 2024-05-22 LAB — METANEPHRINES, PLASMA
Metanephrine, Free: 43 pg/mL (ref <=57)
Normetanephrine, Free: 162 pg/mL — ABNORMAL HIGH (ref <=148)
Total Metanephrines-Plasma: 205 pg/mL (ref <=205)

## 2024-05-22 LAB — T4, FREE: Free T4: 1 ng/dL (ref 0.8–1.8)

## 2024-05-22 LAB — TSH: TSH: 1.87 m[IU]/L (ref 0.40–4.50)

## 2024-05-23 ENCOUNTER — Ambulatory Visit: Payer: Self-pay | Admitting: Endocrinology

## 2024-05-23 DIAGNOSIS — I10 Essential (primary) hypertension: Secondary | ICD-10-CM | POA: Diagnosis not present

## 2024-05-23 DIAGNOSIS — I340 Nonrheumatic mitral (valve) insufficiency: Secondary | ICD-10-CM | POA: Diagnosis not present

## 2024-05-23 DIAGNOSIS — I351 Nonrheumatic aortic (valve) insufficiency: Secondary | ICD-10-CM | POA: Diagnosis not present

## 2024-05-23 DIAGNOSIS — I480 Paroxysmal atrial fibrillation: Secondary | ICD-10-CM | POA: Diagnosis not present

## 2024-05-27 ENCOUNTER — Other Ambulatory Visit (HOSPITAL_BASED_OUTPATIENT_CLINIC_OR_DEPARTMENT_OTHER): Payer: Self-pay

## 2024-05-27 DIAGNOSIS — R61 Generalized hyperhidrosis: Secondary | ICD-10-CM | POA: Diagnosis not present

## 2024-05-27 DIAGNOSIS — F330 Major depressive disorder, recurrent, mild: Secondary | ICD-10-CM | POA: Diagnosis not present

## 2024-05-27 DIAGNOSIS — F411 Generalized anxiety disorder: Secondary | ICD-10-CM | POA: Diagnosis not present

## 2024-05-27 DIAGNOSIS — L4050 Arthropathic psoriasis, unspecified: Secondary | ICD-10-CM | POA: Diagnosis not present

## 2024-05-27 MED ORDER — OXYBUTYNIN CHLORIDE ER 5 MG PO TB24
5.0000 mg | ORAL_TABLET | Freq: Every day | ORAL | 2 refills | Status: AC
  Filled 2024-05-27: qty 30, 30d supply, fill #0
  Filled 2024-10-10: qty 30, 30d supply, fill #1

## 2024-05-27 MED ORDER — DULOXETINE HCL 20 MG PO CPEP
40.0000 mg | ORAL_CAPSULE | Freq: Every day | ORAL | 1 refills | Status: AC
  Filled 2024-05-27 – 2024-08-11 (×2): qty 180, 90d supply, fill #0

## 2024-05-30 ENCOUNTER — Other Ambulatory Visit (HOSPITAL_BASED_OUTPATIENT_CLINIC_OR_DEPARTMENT_OTHER): Payer: Self-pay

## 2024-06-03 ENCOUNTER — Other Ambulatory Visit: Payer: Self-pay

## 2024-06-03 ENCOUNTER — Other Ambulatory Visit (HOSPITAL_BASED_OUTPATIENT_CLINIC_OR_DEPARTMENT_OTHER): Payer: Self-pay

## 2024-06-03 MED ORDER — LISDEXAMFETAMINE DIMESYLATE 40 MG PO CAPS
40.0000 mg | ORAL_CAPSULE | Freq: Every day | ORAL | 0 refills | Status: AC
  Filled 2024-06-03 (×2): qty 30, 30d supply, fill #0

## 2024-06-03 MED ORDER — BACLOFEN 10 MG PO TABS
10.0000 mg | ORAL_TABLET | Freq: Three times a day (TID) | ORAL | 0 refills | Status: AC | PRN
  Filled 2024-06-03: qty 90, 30d supply, fill #0

## 2024-06-03 MED ORDER — ALBUTEROL SULFATE HFA 108 (90 BASE) MCG/ACT IN AERS
INHALATION_SPRAY | RESPIRATORY_TRACT | 2 refills | Status: AC
  Filled 2024-06-03: qty 6.7, 30d supply, fill #0
  Filled 2024-06-03: qty 6.7, 25d supply, fill #0
  Filled 2024-07-15 – 2024-07-19 (×2): qty 6.7, 25d supply, fill #1
  Filled 2024-08-11: qty 6.7, 25d supply, fill #2

## 2024-06-07 ENCOUNTER — Other Ambulatory Visit (HOSPITAL_BASED_OUTPATIENT_CLINIC_OR_DEPARTMENT_OTHER): Payer: Self-pay

## 2024-06-09 ENCOUNTER — Other Ambulatory Visit (HOSPITAL_BASED_OUTPATIENT_CLINIC_OR_DEPARTMENT_OTHER): Payer: Self-pay

## 2024-06-09 MED ORDER — TRAZODONE HCL 50 MG PO TABS
50.0000 mg | ORAL_TABLET | Freq: Every day | ORAL | 1 refills | Status: AC
  Filled 2024-06-09: qty 180, 90d supply, fill #0
  Filled 2024-08-15: qty 180, 90d supply, fill #1

## 2024-06-09 MED ORDER — ROSUVASTATIN CALCIUM 40 MG PO TABS
40.0000 mg | ORAL_TABLET | Freq: Every day | ORAL | 1 refills | Status: AC
  Filled 2024-06-09: qty 90, 90d supply, fill #0
  Filled 2024-08-15: qty 90, 90d supply, fill #1

## 2024-06-09 MED ORDER — FUROSEMIDE 40 MG PO TABS
40.0000 mg | ORAL_TABLET | Freq: Every day | ORAL | 1 refills | Status: AC
  Filled 2024-06-17: qty 90, 90d supply, fill #0
  Filled 2024-09-12: qty 90, 90d supply, fill #1

## 2024-06-16 ENCOUNTER — Other Ambulatory Visit (HOSPITAL_BASED_OUTPATIENT_CLINIC_OR_DEPARTMENT_OTHER): Payer: Self-pay

## 2024-06-16 MED ORDER — LISDEXAMFETAMINE DIMESYLATE 50 MG PO CAPS: 50.0000 mg | ORAL_CAPSULE | Freq: Every day | ORAL | 0 refills | Status: AC

## 2024-06-16 MED ORDER — LISDEXAMFETAMINE DIMESYLATE 50 MG PO CAPS
50.0000 mg | ORAL_CAPSULE | Freq: Every day | ORAL | 0 refills | Status: AC
  Filled 2024-06-16: qty 30, 30d supply, fill #0

## 2024-06-16 MED ORDER — DULOXETINE HCL 20 MG PO CPEP
40.0000 mg | ORAL_CAPSULE | Freq: Every day | ORAL | 3 refills | Status: AC
  Filled 2024-06-16: qty 60, 30d supply, fill #0
  Filled 2024-07-15: qty 60, 30d supply, fill #1
  Filled 2024-10-27: qty 60, 30d supply, fill #2

## 2024-06-17 ENCOUNTER — Other Ambulatory Visit (HOSPITAL_BASED_OUTPATIENT_CLINIC_OR_DEPARTMENT_OTHER): Payer: Self-pay

## 2024-06-17 ENCOUNTER — Other Ambulatory Visit: Payer: Self-pay

## 2024-06-23 ENCOUNTER — Other Ambulatory Visit (HOSPITAL_BASED_OUTPATIENT_CLINIC_OR_DEPARTMENT_OTHER): Payer: Self-pay

## 2024-06-23 ENCOUNTER — Other Ambulatory Visit: Payer: Self-pay

## 2024-06-23 DIAGNOSIS — Z23 Encounter for immunization: Secondary | ICD-10-CM | POA: Diagnosis not present

## 2024-06-23 DIAGNOSIS — Z5941 Food insecurity: Secondary | ICD-10-CM | POA: Diagnosis not present

## 2024-06-23 DIAGNOSIS — Z1211 Encounter for screening for malignant neoplasm of colon: Secondary | ICD-10-CM | POA: Diagnosis not present

## 2024-06-23 DIAGNOSIS — I10 Essential (primary) hypertension: Secondary | ICD-10-CM | POA: Diagnosis not present

## 2024-06-23 DIAGNOSIS — R61 Generalized hyperhidrosis: Secondary | ICD-10-CM | POA: Diagnosis not present

## 2024-06-23 DIAGNOSIS — R413 Other amnesia: Secondary | ICD-10-CM | POA: Diagnosis not present

## 2024-06-23 DIAGNOSIS — Z800 Family history of malignant neoplasm of digestive organs: Secondary | ICD-10-CM | POA: Diagnosis not present

## 2024-06-23 DIAGNOSIS — Z0000 Encounter for general adult medical examination without abnormal findings: Secondary | ICD-10-CM | POA: Diagnosis not present

## 2024-06-23 MED ORDER — OXYBUTYNIN CHLORIDE ER 10 MG PO TB24
10.0000 mg | ORAL_TABLET | Freq: Every day | ORAL | 2 refills | Status: DC
  Filled 2024-06-23: qty 30, 30d supply, fill #0
  Filled 2024-07-19: qty 30, 30d supply, fill #1
  Filled 2024-08-18: qty 30, 30d supply, fill #2

## 2024-06-23 MED ORDER — DOXAZOSIN MESYLATE 4 MG PO TABS
4.0000 mg | ORAL_TABLET | Freq: Every day | ORAL | 1 refills | Status: AC
  Filled 2024-06-23: qty 90, 90d supply, fill #0
  Filled 2024-09-12: qty 90, 90d supply, fill #1

## 2024-06-23 MED ORDER — MEMANTINE HCL 5 MG PO TABS
10.0000 mg | ORAL_TABLET | Freq: Every day | ORAL | 1 refills | Status: AC
  Filled 2024-06-23 (×2): qty 90, 45d supply, fill #0
  Filled 2024-08-15 (×2): qty 180, 90d supply, fill #1

## 2024-06-24 ENCOUNTER — Other Ambulatory Visit (HOSPITAL_BASED_OUTPATIENT_CLINIC_OR_DEPARTMENT_OTHER): Payer: Self-pay

## 2024-06-27 ENCOUNTER — Other Ambulatory Visit (HOSPITAL_BASED_OUTPATIENT_CLINIC_OR_DEPARTMENT_OTHER): Payer: Self-pay

## 2024-06-28 ENCOUNTER — Other Ambulatory Visit (HOSPITAL_BASED_OUTPATIENT_CLINIC_OR_DEPARTMENT_OTHER): Payer: Self-pay

## 2024-06-28 MED ORDER — OXYCODONE-ACETAMINOPHEN 7.5-325 MG PO TABS
1.0000 | ORAL_TABLET | Freq: Four times a day (QID) | ORAL | 0 refills | Status: AC | PRN
  Filled 2024-06-28: qty 120, 30d supply, fill #0

## 2024-06-29 ENCOUNTER — Other Ambulatory Visit (HOSPITAL_BASED_OUTPATIENT_CLINIC_OR_DEPARTMENT_OTHER): Payer: Self-pay

## 2024-06-30 DIAGNOSIS — M51369 Other intervertebral disc degeneration, lumbar region without mention of lumbar back pain or lower extremity pain: Secondary | ICD-10-CM | POA: Diagnosis not present

## 2024-07-01 ENCOUNTER — Other Ambulatory Visit (HOSPITAL_BASED_OUTPATIENT_CLINIC_OR_DEPARTMENT_OTHER): Payer: Self-pay

## 2024-07-01 MED ORDER — ALBUTEROL SULFATE HFA 108 (90 BASE) MCG/ACT IN AERS
2.0000 | INHALATION_SPRAY | RESPIRATORY_TRACT | 0 refills | Status: AC | PRN
  Filled 2024-07-01: qty 6.7, 25d supply, fill #0

## 2024-07-07 ENCOUNTER — Other Ambulatory Visit (HOSPITAL_BASED_OUTPATIENT_CLINIC_OR_DEPARTMENT_OTHER): Payer: Self-pay

## 2024-07-08 ENCOUNTER — Other Ambulatory Visit (HOSPITAL_BASED_OUTPATIENT_CLINIC_OR_DEPARTMENT_OTHER): Payer: Self-pay

## 2024-07-11 ENCOUNTER — Other Ambulatory Visit (HOSPITAL_BASED_OUTPATIENT_CLINIC_OR_DEPARTMENT_OTHER): Payer: Self-pay

## 2024-07-14 ENCOUNTER — Other Ambulatory Visit (HOSPITAL_BASED_OUTPATIENT_CLINIC_OR_DEPARTMENT_OTHER): Payer: Self-pay

## 2024-07-15 ENCOUNTER — Other Ambulatory Visit (HOSPITAL_BASED_OUTPATIENT_CLINIC_OR_DEPARTMENT_OTHER): Payer: Self-pay

## 2024-07-15 ENCOUNTER — Other Ambulatory Visit: Payer: Self-pay

## 2024-07-22 ENCOUNTER — Other Ambulatory Visit (HOSPITAL_BASED_OUTPATIENT_CLINIC_OR_DEPARTMENT_OTHER): Payer: Self-pay

## 2024-07-22 DIAGNOSIS — M48061 Spinal stenosis, lumbar region without neurogenic claudication: Secondary | ICD-10-CM | POA: Diagnosis not present

## 2024-07-22 DIAGNOSIS — M47816 Spondylosis without myelopathy or radiculopathy, lumbar region: Secondary | ICD-10-CM | POA: Diagnosis not present

## 2024-07-22 DIAGNOSIS — G894 Chronic pain syndrome: Secondary | ICD-10-CM | POA: Diagnosis not present

## 2024-07-22 DIAGNOSIS — Z79891 Long term (current) use of opiate analgesic: Secondary | ICD-10-CM | POA: Diagnosis not present

## 2024-07-22 DIAGNOSIS — Z5181 Encounter for therapeutic drug level monitoring: Secondary | ICD-10-CM | POA: Diagnosis not present

## 2024-07-22 MED ORDER — OXYCODONE-ACETAMINOPHEN 7.5-325 MG PO TABS
1.0000 | ORAL_TABLET | Freq: Four times a day (QID) | ORAL | 0 refills | Status: DC
  Filled 2024-07-28: qty 120, 30d supply, fill #0

## 2024-07-28 ENCOUNTER — Other Ambulatory Visit (HOSPITAL_BASED_OUTPATIENT_CLINIC_OR_DEPARTMENT_OTHER): Payer: Self-pay

## 2024-07-28 ENCOUNTER — Other Ambulatory Visit: Payer: Self-pay

## 2024-07-29 ENCOUNTER — Other Ambulatory Visit (HOSPITAL_BASED_OUTPATIENT_CLINIC_OR_DEPARTMENT_OTHER): Payer: Self-pay

## 2024-07-29 MED ORDER — LISDEXAMFETAMINE DIMESYLATE 50 MG PO CAPS
50.0000 mg | ORAL_CAPSULE | Freq: Every day | ORAL | 0 refills | Status: AC
  Filled 2024-07-29: qty 30, 30d supply, fill #0

## 2024-08-02 ENCOUNTER — Other Ambulatory Visit (HOSPITAL_BASED_OUTPATIENT_CLINIC_OR_DEPARTMENT_OTHER): Payer: Self-pay

## 2024-08-03 ENCOUNTER — Other Ambulatory Visit (HOSPITAL_BASED_OUTPATIENT_CLINIC_OR_DEPARTMENT_OTHER): Payer: Self-pay

## 2024-08-10 ENCOUNTER — Other Ambulatory Visit (HOSPITAL_BASED_OUTPATIENT_CLINIC_OR_DEPARTMENT_OTHER): Payer: Self-pay

## 2024-08-11 ENCOUNTER — Other Ambulatory Visit (HOSPITAL_BASED_OUTPATIENT_CLINIC_OR_DEPARTMENT_OTHER): Payer: Self-pay

## 2024-08-11 MED ORDER — BACLOFEN 10 MG PO TABS
10.0000 mg | ORAL_TABLET | Freq: Three times a day (TID) | ORAL | 0 refills | Status: DC | PRN
  Filled 2024-08-12: qty 90, 30d supply, fill #0

## 2024-08-12 ENCOUNTER — Other Ambulatory Visit (HOSPITAL_BASED_OUTPATIENT_CLINIC_OR_DEPARTMENT_OTHER): Payer: Self-pay

## 2024-08-12 DIAGNOSIS — M48062 Spinal stenosis, lumbar region with neurogenic claudication: Secondary | ICD-10-CM | POA: Diagnosis not present

## 2024-08-12 MED ORDER — LEVOCETIRIZINE DIHYDROCHLORIDE 5 MG PO TABS
5.0000 mg | ORAL_TABLET | Freq: Every evening | ORAL | 1 refills | Status: AC
  Filled 2024-08-12: qty 90, 90d supply, fill #0
  Filled 2024-10-13: qty 30, 30d supply, fill #1
  Filled 2024-10-27: qty 90, 90d supply, fill #1

## 2024-08-15 ENCOUNTER — Other Ambulatory Visit (HOSPITAL_BASED_OUTPATIENT_CLINIC_OR_DEPARTMENT_OTHER): Payer: Self-pay

## 2024-08-15 ENCOUNTER — Other Ambulatory Visit: Payer: Self-pay

## 2024-08-16 ENCOUNTER — Other Ambulatory Visit: Payer: Self-pay

## 2024-08-19 ENCOUNTER — Other Ambulatory Visit (HOSPITAL_BASED_OUTPATIENT_CLINIC_OR_DEPARTMENT_OTHER): Payer: Self-pay

## 2024-08-22 DIAGNOSIS — I351 Nonrheumatic aortic (valve) insufficiency: Secondary | ICD-10-CM | POA: Diagnosis not present

## 2024-08-22 DIAGNOSIS — I340 Nonrheumatic mitral (valve) insufficiency: Secondary | ICD-10-CM | POA: Diagnosis not present

## 2024-08-22 DIAGNOSIS — I10 Essential (primary) hypertension: Secondary | ICD-10-CM | POA: Diagnosis not present

## 2024-08-24 ENCOUNTER — Other Ambulatory Visit (HOSPITAL_BASED_OUTPATIENT_CLINIC_OR_DEPARTMENT_OTHER): Payer: Self-pay

## 2024-08-24 ENCOUNTER — Other Ambulatory Visit: Payer: Self-pay

## 2024-08-24 MED ORDER — SPIRONOLACTONE 25 MG PO TABS
25.0000 mg | ORAL_TABLET | Freq: Every day | ORAL | 1 refills | Status: AC
  Filled 2024-08-24: qty 90, 90d supply, fill #0

## 2024-08-24 MED ORDER — MECLIZINE HCL 25 MG PO TABS
25.0000 mg | ORAL_TABLET | Freq: Two times a day (BID) | ORAL | 1 refills | Status: AC | PRN
  Filled 2024-08-24: qty 180, 90d supply, fill #0
  Filled 2024-10-10: qty 167, 84d supply, fill #0
  Filled 2024-10-13: qty 60, 30d supply, fill #0

## 2024-08-24 MED ORDER — OXYBUTYNIN CHLORIDE ER 10 MG PO TB24
10.0000 mg | ORAL_TABLET | Freq: Every day | ORAL | 1 refills | Status: AC
  Filled 2024-08-24 – 2024-09-12 (×2): qty 90, 90d supply, fill #0

## 2024-08-25 DIAGNOSIS — I639 Cerebral infarction, unspecified: Secondary | ICD-10-CM | POA: Diagnosis not present

## 2024-08-25 DIAGNOSIS — F17200 Nicotine dependence, unspecified, uncomplicated: Secondary | ICD-10-CM | POA: Diagnosis not present

## 2024-08-25 DIAGNOSIS — Z8659 Personal history of other mental and behavioral disorders: Secondary | ICD-10-CM | POA: Diagnosis not present

## 2024-08-25 DIAGNOSIS — I10 Essential (primary) hypertension: Secondary | ICD-10-CM | POA: Diagnosis not present

## 2024-08-25 DIAGNOSIS — I480 Paroxysmal atrial fibrillation: Secondary | ICD-10-CM | POA: Diagnosis not present

## 2024-08-25 DIAGNOSIS — I351 Nonrheumatic aortic (valve) insufficiency: Secondary | ICD-10-CM | POA: Diagnosis not present

## 2024-08-25 DIAGNOSIS — I340 Nonrheumatic mitral (valve) insufficiency: Secondary | ICD-10-CM | POA: Diagnosis not present

## 2024-08-25 DIAGNOSIS — E669 Obesity, unspecified: Secondary | ICD-10-CM | POA: Diagnosis not present

## 2024-08-25 DIAGNOSIS — Z01818 Encounter for other preprocedural examination: Secondary | ICD-10-CM | POA: Diagnosis not present

## 2024-08-29 ENCOUNTER — Other Ambulatory Visit (HOSPITAL_BASED_OUTPATIENT_CLINIC_OR_DEPARTMENT_OTHER): Payer: Self-pay

## 2024-08-29 MED ORDER — LISDEXAMFETAMINE DIMESYLATE 50 MG PO CAPS
50.0000 mg | ORAL_CAPSULE | Freq: Every day | ORAL | 0 refills | Status: AC
  Filled 2024-08-29: qty 30, 30d supply, fill #0

## 2024-08-30 ENCOUNTER — Other Ambulatory Visit (HOSPITAL_BASED_OUTPATIENT_CLINIC_OR_DEPARTMENT_OTHER): Payer: Self-pay

## 2024-08-31 ENCOUNTER — Other Ambulatory Visit (HOSPITAL_BASED_OUTPATIENT_CLINIC_OR_DEPARTMENT_OTHER): Payer: Self-pay

## 2024-08-31 MED ORDER — OXYCODONE-ACETAMINOPHEN 7.5-325 MG PO TABS
ORAL_TABLET | ORAL | 0 refills | Status: AC
  Filled 2024-08-31: qty 120, 30d supply, fill #0

## 2024-09-06 ENCOUNTER — Other Ambulatory Visit: Payer: Self-pay

## 2024-09-06 ENCOUNTER — Other Ambulatory Visit (HOSPITAL_BASED_OUTPATIENT_CLINIC_OR_DEPARTMENT_OTHER): Payer: Self-pay

## 2024-09-06 MED ORDER — PREGABALIN 100 MG PO CAPS
100.0000 mg | ORAL_CAPSULE | Freq: Three times a day (TID) | ORAL | 0 refills | Status: AC
  Filled 2024-09-06: qty 270, 90d supply, fill #0

## 2024-09-12 ENCOUNTER — Other Ambulatory Visit (HOSPITAL_BASED_OUTPATIENT_CLINIC_OR_DEPARTMENT_OTHER): Payer: Self-pay

## 2024-09-12 MED ORDER — OMEPRAZOLE 20 MG PO CPDR
20.0000 mg | DELAYED_RELEASE_CAPSULE | Freq: Every day | ORAL | 1 refills | Status: AC
  Filled 2024-09-12: qty 90, 90d supply, fill #0

## 2024-09-23 ENCOUNTER — Other Ambulatory Visit (HOSPITAL_BASED_OUTPATIENT_CLINIC_OR_DEPARTMENT_OTHER): Payer: Self-pay

## 2024-09-27 ENCOUNTER — Other Ambulatory Visit (HOSPITAL_BASED_OUTPATIENT_CLINIC_OR_DEPARTMENT_OTHER): Payer: Self-pay

## 2024-09-27 MED ORDER — LISDEXAMFETAMINE DIMESYLATE 50 MG PO CAPS
50.0000 mg | ORAL_CAPSULE | Freq: Every day | ORAL | 0 refills | Status: AC
  Filled 2024-09-28: qty 30, 30d supply, fill #0

## 2024-09-27 MED ORDER — OXYCODONE-ACETAMINOPHEN 7.5-325 MG PO TABS
1.0000 | ORAL_TABLET | Freq: Four times a day (QID) | ORAL | 0 refills | Status: DC
  Filled 2024-09-30: qty 120, 30d supply, fill #0

## 2024-09-28 ENCOUNTER — Other Ambulatory Visit (HOSPITAL_BASED_OUTPATIENT_CLINIC_OR_DEPARTMENT_OTHER): Payer: Self-pay

## 2024-09-28 ENCOUNTER — Other Ambulatory Visit: Payer: Self-pay

## 2024-09-30 ENCOUNTER — Other Ambulatory Visit (HOSPITAL_BASED_OUTPATIENT_CLINIC_OR_DEPARTMENT_OTHER): Payer: Self-pay

## 2024-09-30 ENCOUNTER — Other Ambulatory Visit: Payer: Self-pay

## 2024-10-03 ENCOUNTER — Other Ambulatory Visit (HOSPITAL_BASED_OUTPATIENT_CLINIC_OR_DEPARTMENT_OTHER): Payer: Self-pay

## 2024-10-04 ENCOUNTER — Other Ambulatory Visit: Payer: Self-pay

## 2024-10-04 ENCOUNTER — Other Ambulatory Visit (HOSPITAL_BASED_OUTPATIENT_CLINIC_OR_DEPARTMENT_OTHER): Payer: Self-pay

## 2024-10-04 MED ORDER — BACLOFEN 10 MG PO TABS
10.0000 mg | ORAL_TABLET | Freq: Three times a day (TID) | ORAL | 0 refills | Status: AC | PRN
  Filled 2024-10-04: qty 90, 30d supply, fill #0

## 2024-10-10 ENCOUNTER — Other Ambulatory Visit (HOSPITAL_BASED_OUTPATIENT_CLINIC_OR_DEPARTMENT_OTHER): Payer: Self-pay

## 2024-10-11 ENCOUNTER — Encounter (HOSPITAL_BASED_OUTPATIENT_CLINIC_OR_DEPARTMENT_OTHER): Payer: Self-pay

## 2024-10-11 ENCOUNTER — Other Ambulatory Visit (HOSPITAL_BASED_OUTPATIENT_CLINIC_OR_DEPARTMENT_OTHER): Payer: Self-pay

## 2024-10-13 ENCOUNTER — Other Ambulatory Visit (HOSPITAL_BASED_OUTPATIENT_CLINIC_OR_DEPARTMENT_OTHER): Payer: Self-pay

## 2024-10-27 ENCOUNTER — Other Ambulatory Visit (HOSPITAL_BASED_OUTPATIENT_CLINIC_OR_DEPARTMENT_OTHER): Payer: Self-pay

## 2024-10-27 MED ORDER — OXYCODONE-ACETAMINOPHEN 7.5-325 MG PO TABS
1.0000 | ORAL_TABLET | Freq: Four times a day (QID) | ORAL | 0 refills | Status: AC
  Filled 2024-10-27: qty 120, 30d supply, fill #0

## 2024-10-31 ENCOUNTER — Other Ambulatory Visit (HOSPITAL_BASED_OUTPATIENT_CLINIC_OR_DEPARTMENT_OTHER): Payer: Self-pay

## 2024-11-01 ENCOUNTER — Other Ambulatory Visit (HOSPITAL_BASED_OUTPATIENT_CLINIC_OR_DEPARTMENT_OTHER): Payer: Self-pay

## 2024-11-01 ENCOUNTER — Other Ambulatory Visit: Payer: Self-pay

## 2024-11-01 MED ORDER — LISDEXAMFETAMINE DIMESYLATE 50 MG PO CAPS
50.0000 mg | ORAL_CAPSULE | Freq: Every day | ORAL | 0 refills | Status: AC
  Filled 2024-11-01: qty 30, 30d supply, fill #0

## 2024-11-01 MED ORDER — LISDEXAMFETAMINE DIMESYLATE 50 MG PO CAPS: 50.0000 mg | ORAL_CAPSULE | Freq: Every day | ORAL | 0 refills | Status: AC

## 2024-11-01 MED ORDER — VEOZAH 45 MG PO TABS
45.0000 mg | ORAL_TABLET | Freq: Every evening | ORAL | 3 refills | Status: AC
  Filled 2024-11-01: qty 30, 30d supply, fill #0

## 2024-11-08 ENCOUNTER — Other Ambulatory Visit (HOSPITAL_BASED_OUTPATIENT_CLINIC_OR_DEPARTMENT_OTHER): Payer: Self-pay

## 2024-11-08 MED ORDER — OXYCODONE-ACETAMINOPHEN 7.5-325 MG PO TABS: 1.0000 | ORAL_TABLET | Freq: Four times a day (QID) | ORAL | 0 refills | Status: AC | PRN

## 2024-11-08 MED ORDER — OXYCODONE-ACETAMINOPHEN 7.5-325 MG PO TABS: 1.0000 | ORAL_TABLET | Freq: Four times a day (QID) | ORAL | 0 refills | Status: AC
